# Patient Record
Sex: Female | Born: 1960 | Race: White | Hispanic: No | Marital: Married | Smoking: Never smoker
Health system: Southern US, Community
[De-identification: ages and names within clinical notes are randomized; demographics above are authoritative.]

## PROBLEM LIST (undated history)

## (undated) DIAGNOSIS — I82409 Acute embolism and thrombosis of unspecified deep veins of unspecified lower extremity: Secondary | ICD-10-CM

## (undated) DIAGNOSIS — J189 Pneumonia, unspecified organism: Secondary | ICD-10-CM

## (undated) DIAGNOSIS — D649 Anemia, unspecified: Secondary | ICD-10-CM

## (undated) DIAGNOSIS — Z800 Family history of malignant neoplasm of digestive organs: Secondary | ICD-10-CM

## (undated) DIAGNOSIS — I2699 Other pulmonary embolism without acute cor pulmonale: Secondary | ICD-10-CM

## (undated) DIAGNOSIS — Z87442 Personal history of urinary calculi: Secondary | ICD-10-CM

## (undated) DIAGNOSIS — Z5189 Encounter for other specified aftercare: Secondary | ICD-10-CM

## (undated) DIAGNOSIS — M1990 Unspecified osteoarthritis, unspecified site: Secondary | ICD-10-CM

## (undated) DIAGNOSIS — Z22322 Carrier or suspected carrier of Methicillin resistant Staphylococcus aureus: Secondary | ICD-10-CM

## (undated) DIAGNOSIS — Z8489 Family history of other specified conditions: Secondary | ICD-10-CM

## (undated) DIAGNOSIS — Z803 Family history of malignant neoplasm of breast: Secondary | ICD-10-CM

## (undated) DIAGNOSIS — Z8042 Family history of malignant neoplasm of prostate: Secondary | ICD-10-CM

## (undated) DIAGNOSIS — I639 Cerebral infarction, unspecified: Secondary | ICD-10-CM

## (undated) DIAGNOSIS — C541 Malignant neoplasm of endometrium: Secondary | ICD-10-CM

## (undated) DIAGNOSIS — E039 Hypothyroidism, unspecified: Secondary | ICD-10-CM

## (undated) DIAGNOSIS — L900 Lichen sclerosus et atrophicus: Secondary | ICD-10-CM

## (undated) DIAGNOSIS — T7840XA Allergy, unspecified, initial encounter: Secondary | ICD-10-CM

## (undated) DIAGNOSIS — R898 Other abnormal findings in specimens from other organs, systems and tissues: Secondary | ICD-10-CM

## (undated) HISTORY — DX: Family history of malignant neoplasm of breast: Z80.3

## (undated) HISTORY — DX: Malignant neoplasm of endometrium: C54.1

## (undated) HISTORY — DX: Anemia, unspecified: D64.9

## (undated) HISTORY — PX: ABDOMINAL HYSTERECTOMY: SHX81

## (undated) HISTORY — DX: Other abnormal findings in specimens from other organs, systems and tissues: R89.8

## (undated) HISTORY — DX: Encounter for other specified aftercare: Z51.89

## (undated) HISTORY — DX: Allergy, unspecified, initial encounter: T78.40XA

## (undated) HISTORY — DX: Family history of malignant neoplasm of digestive organs: Z80.0

## (undated) HISTORY — PX: MOUTH SURGERY: SHX715

## (undated) HISTORY — DX: Family history of malignant neoplasm of prostate: Z80.42

---

## 2006-08-27 ENCOUNTER — Other Ambulatory Visit: Admission: RE | Admit: 2006-08-27 | Discharge: 2006-08-27 | Payer: Self-pay | Admitting: Obstetrics and Gynecology

## 2007-09-03 ENCOUNTER — Other Ambulatory Visit: Admission: RE | Admit: 2007-09-03 | Discharge: 2007-09-03 | Payer: Self-pay | Admitting: Obstetrics and Gynecology

## 2008-09-03 ENCOUNTER — Other Ambulatory Visit: Admission: RE | Admit: 2008-09-03 | Discharge: 2008-09-03 | Payer: Self-pay | Admitting: Obstetrics and Gynecology

## 2009-01-25 ENCOUNTER — Ambulatory Visit: Payer: Self-pay | Admitting: Sports Medicine

## 2009-01-25 DIAGNOSIS — M25559 Pain in unspecified hip: Secondary | ICD-10-CM | POA: Insufficient documentation

## 2009-01-25 DIAGNOSIS — R29898 Other symptoms and signs involving the musculoskeletal system: Secondary | ICD-10-CM | POA: Insufficient documentation

## 2009-01-25 DIAGNOSIS — M4120 Other idiopathic scoliosis, site unspecified: Secondary | ICD-10-CM | POA: Insufficient documentation

## 2009-01-25 DIAGNOSIS — M25519 Pain in unspecified shoulder: Secondary | ICD-10-CM | POA: Insufficient documentation

## 2009-01-25 DIAGNOSIS — M2170 Unequal limb length (acquired), unspecified site: Secondary | ICD-10-CM | POA: Insufficient documentation

## 2009-02-22 ENCOUNTER — Ambulatory Visit: Payer: Self-pay | Admitting: Sports Medicine

## 2009-02-23 ENCOUNTER — Encounter: Payer: Self-pay | Admitting: Sports Medicine

## 2010-07-12 NOTE — Assessment & Plan Note (Signed)
Summary: fu hip/jw   Vital Signs:  Patient profile:   50 year old female BP sitting:   118 / 70  Vitals Entered By: Lillia Pauls CMA (February 22, 2009 9:45 AM)  History of Present Illness: 25F with L shoulder pain, R hip pain, bilateral knee discomfort, Lumbar back pain.  L shoulder:  rotator cuff syndrome, has been doing rotator cuff exercises but still with weak abduction.  Not c/o paresthesias today, did have computer moved up to relieve chronic neck flexion and keyboard placed lower and this significantly improved her symptoms.   No difficulty with overhead activities but mild pain persists.  no longer bothering her at night.  R hip:  known right leg length discrepancy, has been using right heel pad but only when exercising, states her symptoms are >50% resolved.  Needs another felt heel pad. She is a runner that still runs 3 mi every other day w/o changes in mileage.  Knees:  has crepitus behind patella, no pain.  present for years. No catching/locking.  No consistent "giving out" of the knees.  Better per pt.  LBP:  has known mild scoliosis, no radicular symptoms. Pain with biking that resolves with recumbent bike.  Review of Systems       See HPI  Physical Exam  General:  Well-developed,well-nourished,in no acute distress; alert,appropriate and cooperative throughout examination Msk:  Back Exam: Inspection: lumbar scoliosis noted with right sided prominence Motion: good ROM SLR seated: neg                         SLR lying:neg XSLR seated:  neg                      XSLR lying:neg Palpable tenderness:none FABER:non tender Sensory change:none Strength normal at hip and foot Gait normal  Knee: Normal to inspection with no erythema or effusion or obvious bony abnormalities. Palpation normal with no warmth or joint line tenderness or patellar tenderness or condyle tenderness. ROM normal in flexion and extension and lower leg rotation.  Ligaments with solid consistent endpoints including ACL, PCL, LCL, MCL. Negative Mcmurray's and provocative meniscal tests. Non painful patellar compression however this does increase crepitus. Patellar and quadriceps tendons unremarkable. Hamstring and quadriceps strength is normal.   Shoulder: Inspection reveals no abnormalities, atrophy or asymmetry. Palpation is normal with no tenderness over AC joint or bicipital groove. ROM is full in all planes. Rotator cuff strength weak to abduction on left No signs of impingement with negative Neer and Hawkin's tests, empty can. Speeds and Yergason's tests normal. No labral pathology noted with good stability. Normal scapular function observed. No painful arc and no drop arm sign. No apprehension sign Spurlings negative   Impression & Recommendations:  Problem # 1:  SHOULDER PAIN, LEFT (ICD-719.41) Assessment Improved improving rotator cuff syndrome, predominantly supraspinatus on exam today, will transition to rehab exercises with light weight for IR/ER/abd movements.  Continue to use monitor of computer higher up and keyboard lower to facilitate normal cervical lordosis and decrease activity with shoulders in flexion.  she has a number of MS issues and we discussed reassessing these in 3 months or so seh will work on rehab program and overall conditioning  Problem # 2:  UNEQUAL LEG LENGTH (ICD-736.81) Assessment: Improved Still using right heel pad, doing better, gave some additional pads for her to use if needed in every day walking shoes  Problem # 3:  JOINT CREPITUS, KNEE (  GMW-102.72) Assessment: Improved Likely in patellofemoral joint, recommended VMO strengthening exercises.  Problem # 4:  HIP PAIN, RIGHT (ICD-719.45) Improving with fixing leg length discrepancy.  Pt to continue her home exercises to strengthen abductors.  Problem # 5:  SCOLIOSIS (ICD-737.30)  With tension/strain in right paraspinal muscles.  Recommended stretching exercises for these muscles as well as pelvic tilts, right knee to left chest stretches.

## 2012-09-18 ENCOUNTER — Other Ambulatory Visit (HOSPITAL_COMMUNITY): Payer: Self-pay | Admitting: Chiropractic Medicine

## 2012-09-18 ENCOUNTER — Ambulatory Visit (HOSPITAL_COMMUNITY)
Admission: RE | Admit: 2012-09-18 | Discharge: 2012-09-18 | Disposition: A | Discharge status: Home / Self Care | Payer: 59 | Source: Ambulatory Visit | Attending: Chiropractic Medicine | Admitting: Chiropractic Medicine

## 2012-09-18 DIAGNOSIS — M545 Low back pain, unspecified: Secondary | ICD-10-CM

## 2012-09-18 DIAGNOSIS — R209 Unspecified disturbances of skin sensation: Secondary | ICD-10-CM | POA: Insufficient documentation

## 2012-09-18 DIAGNOSIS — R52 Pain, unspecified: Secondary | ICD-10-CM

## 2012-09-18 DIAGNOSIS — M47817 Spondylosis without myelopathy or radiculopathy, lumbosacral region: Secondary | ICD-10-CM | POA: Insufficient documentation

## 2012-09-18 DIAGNOSIS — M4040 Postural lordosis, site unspecified: Secondary | ICD-10-CM | POA: Insufficient documentation

## 2012-09-18 DIAGNOSIS — M25559 Pain in unspecified hip: Secondary | ICD-10-CM | POA: Insufficient documentation

## 2012-09-18 DIAGNOSIS — Q762 Congenital spondylolisthesis: Secondary | ICD-10-CM | POA: Insufficient documentation

## 2016-01-12 ENCOUNTER — Encounter (HOSPITAL_COMMUNITY): Payer: Self-pay | Admitting: Emergency Medicine

## 2016-01-12 ENCOUNTER — Emergency Department (HOSPITAL_COMMUNITY): Payer: 59

## 2016-01-12 ENCOUNTER — Observation Stay (HOSPITAL_COMMUNITY)
Admission: EM | Admit: 2016-01-12 | Discharge: 2016-01-14 | Disposition: A | Discharge status: Home / Self Care | Payer: 59 | Attending: Internal Medicine | Admitting: Internal Medicine

## 2016-01-12 ENCOUNTER — Encounter (HOSPITAL_COMMUNITY): Payer: Self-pay | Admitting: *Deleted

## 2016-01-12 ENCOUNTER — Ambulatory Visit (INDEPENDENT_AMBULATORY_CARE_PROVIDER_SITE_OTHER)
Admission: EM | Admit: 2016-01-12 | Discharge: 2016-01-12 | Disposition: A | Discharge status: Nursing Home (Includes ICF) | Payer: 59 | Source: Home / Self Care | Attending: Family Medicine | Admitting: Family Medicine

## 2016-01-12 DIAGNOSIS — R0602 Shortness of breath: Secondary | ICD-10-CM

## 2016-01-12 DIAGNOSIS — E063 Autoimmune thyroiditis: Secondary | ICD-10-CM | POA: Diagnosis not present

## 2016-01-12 DIAGNOSIS — R1011 Right upper quadrant pain: Secondary | ICD-10-CM

## 2016-01-12 DIAGNOSIS — I2699 Other pulmonary embolism without acute cor pulmonale: Secondary | ICD-10-CM | POA: Diagnosis present

## 2016-01-12 HISTORY — DX: Hypothyroidism, unspecified: E03.9

## 2016-01-12 HISTORY — DX: Other pulmonary embolism without acute cor pulmonale: I26.99

## 2016-01-12 LAB — CBC
HCT: 37.9 % (ref 36.0–46.0)
Hemoglobin: 12.4 g/dL (ref 12.0–15.0)
MCH: 29.3 pg (ref 26.0–34.0)
MCHC: 32.7 g/dL (ref 30.0–36.0)
MCV: 89.6 fL (ref 78.0–100.0)
Platelets: 219 10*3/uL (ref 150–400)
RBC: 4.23 MIL/uL (ref 3.87–5.11)
RDW: 12.7 % (ref 11.5–15.5)
WBC: 7.2 10*3/uL (ref 4.0–10.5)

## 2016-01-12 LAB — URINE MICROSCOPIC-ADD ON: RBC / HPF: NONE SEEN RBC/hpf (ref 0–5)

## 2016-01-12 LAB — COMPREHENSIVE METABOLIC PANEL
ALT: 19 U/L (ref 14–54)
AST: 21 U/L (ref 15–41)
Albumin: 4.2 g/dL (ref 3.5–5.0)
Alkaline Phosphatase: 69 U/L (ref 38–126)
Anion gap: 8 (ref 5–15)
BUN: 11 mg/dL (ref 6–20)
CO2: 26 mmol/L (ref 22–32)
Calcium: 9.2 mg/dL (ref 8.9–10.3)
Chloride: 105 mmol/L (ref 101–111)
Creatinine, Ser: 0.73 mg/dL (ref 0.44–1.00)
GFR calc Af Amer: >60 mL/min (ref >60)
GFR calc non Af Amer: >60 mL/min (ref >60)
Glucose, Bld: 93 mg/dL (ref 65–99)
Potassium: 3.5 mmol/L (ref 3.5–5.1)
Sodium: 139 mmol/L (ref 135–145)
Total Bilirubin: 0.5 mg/dL (ref 0.3–1.2)
Total Protein: 7.3 g/dL (ref 6.5–8.1)

## 2016-01-12 LAB — URINALYSIS, ROUTINE W REFLEX MICROSCOPIC
Bilirubin Urine: NEGATIVE
Glucose, UA: NEGATIVE mg/dL
Hgb urine dipstick: NEGATIVE
Ketones, ur: NEGATIVE mg/dL
Nitrite: NEGATIVE
Protein, ur: NEGATIVE mg/dL
Specific Gravity, Urine: 1.011 (ref 1.005–1.030)
pH: 6 (ref 5.0–8.0)

## 2016-01-12 LAB — LIPASE, BLOOD: Lipase: 28 U/L (ref 11–51)

## 2016-01-12 LAB — HCG, QUANTITATIVE, PREGNANCY: hCG, Beta Chain, Quant, S: 3 m[IU]/mL (ref <5)

## 2016-01-12 NOTE — ED Provider Notes (Signed)
[facility]    CSN: [id number] Arrival date & time: 01/12/16  1753  First Provider Contact:  First MD Initiated Contact with Patient 01/12/16 1917        History   Chief Complaint Chief Complaint  Patient presents with  . Abdominal Pain    HPI Karsha Scheidt is a 55 y.o. female.    Abdominal Pain  Pain location:  RUQ Pain quality: cramping   Pain radiates to:  R shoulder Pain severity:  Moderate Onset quality:  Sudden Duration:  18 hours Progression:  Waxing and waning Chronicity:  New Context: awakening from sleep   Context: not previous surgeries   Relieved by:  None tried Ineffective treatments:  None tried Associated symptoms: no chest pain, no constipation, no diarrhea, no dysuria, no fever, no nausea, no shortness of breath and no vomiting   Risk factors: has not had multiple surgeries     Past Medical History:  Diagnosis Date  . Hashimoto's disease     Patient Active Problem List   Diagnosis Date Noted  . SHOULDER PAIN, LEFT 01/25/2009  . HIP PAIN, RIGHT 01/25/2009  . JOINT CREPITUS, KNEE 01/25/2009  . UNEQUAL LEG LENGTH 01/25/2009  . SCOLIOSIS 01/25/2009    Past Surgical History:  Procedure Laterality Date  . MOUTH SURGERY      OB History    No data available       Home Medications    Prior to Admission medications   Medication Sig Start Date End Date Taking? Authorizing Provider  thyroid (ARMOUR) 90 MG tablet Take 90 mg by mouth daily.   Yes Historical Provider, MD    Family History History reviewed. No pertinent family history.  Social History Social History  Substance Use Topics  . Smoking status: Never Smoker  . Smokeless tobacco: Never Used  . Alcohol use Yes     Comment: occassional     Allergies   Review of patient's allergies indicates no known allergies.   Review of Systems Review of Systems  Constitutional: Negative.  Negative for fever.  Respiratory: Negative.  Negative for shortness of breath and  wheezing.   Cardiovascular: Negative.  Negative for chest pain.  Gastrointestinal: Positive for abdominal pain. Negative for constipation, diarrhea, nausea and vomiting.  Genitourinary: Negative.  Negative for dysuria and pelvic pain.  All other systems reviewed and are negative.    Physical Exam Triage Vital Signs ED Triage Vitals  Enc Vitals Group     BP 01/12/16 1857 119/98     Pulse Rate 01/12/16 1857 70     Resp 01/12/16 1857 18     Temp 01/12/16 1857 98.8 F (37.1 C)     Temp Source 01/12/16 1857 Oral     SpO2 01/12/16 1857 99 %     Weight 01/12/16 1857 142 lb (64.4 kg)     Height 01/12/16 1857 5\' 3"  (1.6 m)     Head Circumference --      Peak Flow --      Pain Score 01/12/16 1906 8     Pain Loc --      Pain Edu? --      Excl. in [place]? --    No data found.   Updated Vital Signs BP 119/98 (BP Location: Right Arm)   Pulse 70   Temp 98.8 F (37.1 C) (Oral)   Resp 18   Ht 5\' 3"  (1.6 m)   Wt 142 lb (64.4 kg)   LMP 09/04/2012   SpO2  99%   BMI 25.15 kg/m   Visual Acuity Right Eye Distance:   Left Eye Distance:   Bilateral Distance:    Right Eye Near:   Left Eye Near:    Bilateral Near:     Physical Exam  Constitutional: She appears well-developed and well-nourished. She appears distressed.  Abdominal: Soft. Normal appearance and bowel sounds are normal. She exhibits no mass. There is tenderness in the right upper quadrant. There is positive Murphy's sign. There is no rigidity, no rebound, no guarding and no CVA tenderness. No hernia.    Nursing note and vitals reviewed.    UC Treatments / Results  Labs (all labs ordered are listed, but only abnormal results are displayed) Labs Reviewed - No data to display  EKG  EKG Interpretation None       Radiology No results found.  Procedures Procedures (including critical care time)  Medications Ordered in UC Medications - No data to display   Initial Impression / Assessment and Plan / UC Course    I have reviewed the triage vital signs and the nursing notes.  Pertinent labs & imaging results that were available during my care of the patient were reviewed by me and considered in my medical decision making (see chart for details).  Clinical Course      Final Clinical Impressions(s) / UC Diagnoses   Final diagnoses:  Right upper quadrant pain    New Prescriptions New Prescriptions   No medications on file     Billy Fischer, MD 01/12/16 1934

## 2016-01-12 NOTE — ED Triage Notes (Signed)
The patient presented to the [facility] with a complaint of RUQ abdominal pain that started last night and radiates into her right shoulder and back area.

## 2016-01-12 NOTE — ED Provider Notes (Signed)
[facility] DEPT Provider Note   CSN: [id number] Arrival date & time: 01/12/16  1948  By signing my name below, I, Irene Pap, attest that this documentation has been prepared under the direction and in the presence of Veryl Speak, MD. Electronically Signed: Irene Pap, ED Scribe. 01/12/16. 11:28 PM.  First Provider Contact:  None   History   Chief Complaint Chief Complaint  Patient presents with  . Chest Pain   The history is provided by the patient. No language interpreter was used.  Abdominal Pain   This is a new problem. The current episode started yesterday. The problem occurs constantly. The problem has been gradually worsening. The pain is located in the RUQ and chest. The quality of the pain is sharp. The pain is moderate. Pertinent negatives include diarrhea, nausea and vomiting. The symptoms are aggravated by palpation and certain positions. Relieved by: abdominal breathing. Past workup comments: Urgent Care. Past medical history comments: Hashimoto's disease.   HPI Comments: Sherry Williams is a 55 y.o. female with a hx of Hashimoto's disease who presents to the Emergency Department complaining of gradually worsening, sharp RUQ abdominal pain and right lower rib pain onset one day ago. She reports associated SOB. She states that it feels like she "broke a rib bone." Pt reports worsening pain with palpation and certain positions. She reports relief with abdominal breathing. She states that she has been having shoulder pain for "a while" but it has worsened since the abdominal pain started. Pt was seen at Urgent Care and told to come to the ED for a gallbladder work up. Pt recently traveled to [place] and back; she traveled 19 hours by car and began to notice "Charlie Horse" pain to the posterior legs. Pt states that she is generally very healthy and does not consume heavy, greasy foods. She reports maternal hx of kidney stones and that her sister has a hx of blood clots. She  denies leg swelling, nausea, vomiting, or diarrhea.   Past Medical History:  Diagnosis Date  . Hashimoto's disease     Patient Active Problem List   Diagnosis Date Noted  . SHOULDER PAIN, LEFT 01/25/2009  . HIP PAIN, RIGHT 01/25/2009  . JOINT CREPITUS, KNEE 01/25/2009  . UNEQUAL LEG LENGTH 01/25/2009  . SCOLIOSIS 01/25/2009    Past Surgical History:  Procedure Laterality Date  . MOUTH SURGERY      OB History    No data available       Home Medications    Prior to Admission medications   Medication Sig Start Date End Date Taking? Authorizing Provider  thyroid (ARMOUR) 90 MG tablet Take 90 mg by mouth daily.    Historical Provider, MD    Family History History reviewed. No pertinent family history.  Social History Social History  Substance Use Topics  . Smoking status: Never Smoker  . Smokeless tobacco: Never Used  . Alcohol use Yes     Comment: occassional     Allergies   Review of patient's allergies indicates no known allergies.   Review of Systems Review of Systems  Respiratory: Positive for shortness of breath.   Cardiovascular: Negative for leg swelling.  Gastrointestinal: Positive for abdominal pain. Negative for diarrhea, nausea and vomiting.  All other systems reviewed and are negative.  Physical Exam Updated Vital Signs BP 120/76   Pulse 69   Temp 99 F (37.2 C) (Oral)   Resp 18   Ht 5' 5.75" (1.67 m)   Wt 143 lb 3 oz (  64.9 kg)   LMP 09/04/2012   SpO2 100%   BMI 23.29 kg/m   Physical Exam  Constitutional: She is oriented to person, place, and time. She appears well-developed and well-nourished. No distress.  HENT:  Head: Normocephalic and atraumatic.  Mouth/Throat: Oropharynx is clear and moist. No oropharyngeal exudate.  Eyes: Conjunctivae and EOM are normal. Pupils are equal, round, and reactive to light.  Neck: Normal range of motion. Neck supple.  No meningismus.  Cardiovascular: Normal rate, regular rhythm, normal heart  sounds and intact distal pulses.   No murmur heard. Pulmonary/Chest: Effort normal and breath sounds normal. No respiratory distress.  Abdominal: Soft. There is no tenderness. There is no rebound and no guarding.  Musculoskeletal: Normal range of motion. She exhibits no edema or tenderness.  No calf swelling or tenderness. Homan's sign is absent bilaterally  Neurological: She is alert and oriented to person, place, and time. No cranial nerve deficit. She exhibits normal muscle tone. Coordination normal.  Skin: Skin is warm.  Psychiatric: She has a normal mood and affect. Her behavior is normal.  Nursing note and vitals reviewed.    ED Treatments / Results  DIAGNOSTIC STUDIES: Oxygen Saturation is 100% on RA, normal by my interpretation.    COORDINATION OF CARE: 11:25 PM-Discussed treatment plan which includes labs, [place], and EKG with pt at bedside and pt agreed to plan.    Labs (all labs ordered are listed, but only abnormal results are displayed) Labs Reviewed  URINALYSIS, ROUTINE W REFLEX MICROSCOPIC (NOT AT [facility]) - Abnormal; Notable for the following:       Result Value   Leukocytes, UA TRACE (*)    All other components within normal limits  URINE MICROSCOPIC-ADD ON - Abnormal; Notable for the following:    Squamous Epithelial / LPF 0-5 (*)    Bacteria, UA RARE (*)    All other components within normal limits  D-DIMER, QUANTITATIVE (NOT AT [facility]) - Abnormal; Notable for the following:    D-Dimer, Quant >20.00 (*)    All other components within normal limits  LIPASE, BLOOD  COMPREHENSIVE METABOLIC PANEL  CBC  HCG, QUANTITATIVE, PREGNANCY    EKG  EKG Interpretation  Date/Time:  Thursday January 13 2016 00:18:19 EDT Ventricular Rate:  64 PR Interval:    QRS Duration: 97 QT Interval:  416 QTC Calculation: 430 R Axis:   85 Text Interpretation:  Sinus rhythm Short PR interval RSR' in V1 or V2, probably normal variant Confirmed by Asya Derryberry  MD, Loveda Colaizzi ([id number]) on 01/13/2016  12:22:47 AM       Radiology Dg Chest 2 View  Result Date: 01/12/2016 CLINICAL DATA:  Right-sided chest pain, shortness of breath. EXAM: CHEST  2 VIEW COMPARISON:  None. FINDINGS: The heart size and mediastinal contours are within normal limits. Both lungs are clear. No pneumothorax or pleural effusion is noted. The visualized skeletal structures are unremarkable. IMPRESSION: No active cardiopulmonary disease. Electronically Signed   By: Marijo Conception, M.D.   On: 01/12/2016 21:42   Ct Angio Chest Pe W Or Wo Contrast  Result Date: 01/13/2016 CLINICAL DATA:  Chest pain. Right upper quadrant abdominal pain radiating to the right shoulder. EXAM: CT ANGIOGRAPHY CHEST WITH CONTRAST TECHNIQUE: Multidetector CT imaging of the chest was performed using the standard protocol during bolus administration of intravenous contrast. Multiplanar CT image reconstructions and MIPs were obtained to evaluate the vascular anatomy. CONTRAST:  100 cc Isovue 370 IV COMPARISON:  Chest radiographs yesterday at 2140 hour FINDINGS: Cardiovascular:  Filling defects in the segmental anterior right upper lobe, medial and lateral right middle lobe pulmonary arteries consistent with pulmonary embolus. Some of the clot appears E centric for example in the lateral right middle lobe, and may be chronic, however the additional clot may be acute. There is no right heart strain, RV to LV ratio of less than 1. Minimal contrast does reflux into the hepatic veins and IVC. 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 No evidence of dissection. Mediastinum/Nodes: No adenopathy or mass. Lungs/Pleura: Peripheral patchy ground glass airspace opacity in the right middle lobe may be pulmonary infarct. Dependent atelectasis in both lower lobes, right greater than left. There is a small right pleural effusion. Upper Abdomen: No acute abnormality. Musculoskeletal: There are no acute or suspicious osseous abnormalities.  Bone islands within T9. Review of the MIP images confirms the above findings. IMPRESSION: Examination is positive for segmental pulmonary emboli in the right upper and middle lobes. No right heart strain. Minimal pulmonary infarct in the right middle lobe. Small right pleural effusion. Critical Value/emergent results were called by telephone at the time of interpretation on 01/13/2016 at 3:34 am to [place], who verbally acknowledged these results. Electronically Signed   By: Jeb Levering M.D.   On: 01/13/2016 03:36   US Abdomen Limited Ruq  Result Date: 01/13/2016 CLINICAL DATA:  Right upper quadrant pain radiating to the right shoulder for 1 day. EXAM: US ABDOMEN LIMITED - RIGHT UPPER QUADRANT COMPARISON:  None. FINDINGS: Gallbladder: Physiologically distended. No gallstones or wall thickening visualized. No sonographic Murphy sign noted by sonographer. Common bile duct: Diameter: 2.7 mm. Liver: No focal lesion identified. Within normal limits in parenchymal echogenicity. Normal directional flow in the main portal vein. IMPRESSION: Normal right upper quadrant ultrasound. Electronically Signed   By: Jeb Levering M.D.   On: 01/13/2016 00:11    Procedures Procedures (including critical care time)  Medications Ordered in ED Medications - No data to display   Initial Impression / Assessment and Plan / ED Course  I have reviewed the triage vital signs and the nursing notes.  Pertinent labs & imaging results that were available during my care of the patient were reviewed by me and considered in my medical decision making (see chart for details).  Clinical Course      Final Clinical Impressions(s) / ED Diagnoses   Final diagnoses:  None   Patient presents here with complaints of right-sided chest and upper abdomen discomfort and shortness of breath that is worsened over the past 2 days. She recently went on a car trip to and from [place].  Her workup reveals no evidence for  gallbladder pathology, however does reveal an elevated d-dimer and CT scan confirmed pulmonary embolism.  I've discussed these findings with Dr. Loleta Books from the hospitalist service who will admit the patient overnight for anticoagulation and observation.   I personally performed the services described in this documentation, which was scribed in my presence. The recorded information has been reviewed and is accurate.     New Prescriptions New Prescriptions   No medications on file     Veryl Speak, MD 01/13/16 [phone]

## 2016-01-12 NOTE — ED Notes (Signed)
Patient report called to First Rn at [facility] ED.

## 2016-01-12 NOTE — ED Triage Notes (Signed)
Pt c/o RUQ, right lower rib pain. Pt was seen at [facility] for gallbladder workup. Pt states last night she could hardly breath lying down while in pain. Pt states abdominal breathing helps with the pain. Pt has not taken any medications for the pain. Pt denies n/v.

## 2016-01-13 ENCOUNTER — Emergency Department (HOSPITAL_COMMUNITY): Payer: 59

## 2016-01-13 ENCOUNTER — Encounter (HOSPITAL_COMMUNITY): Payer: Self-pay | Admitting: Radiology

## 2016-01-13 ENCOUNTER — Telehealth: Payer: Self-pay | Admitting: General Practice

## 2016-01-13 DIAGNOSIS — I2609 Other pulmonary embolism with acute cor pulmonale: Secondary | ICD-10-CM

## 2016-01-13 DIAGNOSIS — I2699 Other pulmonary embolism without acute cor pulmonale: Secondary | ICD-10-CM | POA: Diagnosis not present

## 2016-01-13 DIAGNOSIS — E038 Other specified hypothyroidism: Secondary | ICD-10-CM

## 2016-01-13 DIAGNOSIS — R1011 Right upper quadrant pain: Secondary | ICD-10-CM

## 2016-01-13 DIAGNOSIS — E063 Autoimmune thyroiditis: Secondary | ICD-10-CM | POA: Diagnosis present

## 2016-01-13 HISTORY — DX: Other pulmonary embolism without acute cor pulmonale: I26.99

## 2016-01-13 LAB — D-DIMER, QUANTITATIVE (NOT AT ARMC): D-Dimer, Quant: >20 ug/mL-FEU — ABNORMAL HIGH (ref 0.00–0.50)

## 2016-01-13 MED ORDER — ACETAMINOPHEN 650 MG RE SUPP: 650.0000 mg | Freq: Four times a day (QID) | RECTAL | Status: DC | PRN

## 2016-01-13 MED ORDER — IBUPROFEN 400 MG PO TABS
400.0000 mg | ORAL_TABLET | Freq: Three times a day (TID) | ORAL | Status: DC | PRN
  Administered 2016-01-13: 400 mg via ORAL
  Administered 2016-01-13: 200 mg via ORAL
  Administered 2016-01-13: 400 mg via ORAL
  Filled 2016-01-13: qty 1
  Filled 2016-01-13 (×2): qty 2

## 2016-01-13 MED ORDER — RIVAROXABAN 15 MG PO TABS: 15.0000 mg | ORAL_TABLET | Freq: Two times a day (BID) | ORAL | Status: DC

## 2016-01-13 MED ORDER — APIXABAN 5 MG PO TABS: 10.0000 mg | ORAL_TABLET | Freq: Two times a day (BID) | ORAL | Status: DC

## 2016-01-13 MED ORDER — IOPAMIDOL (ISOVUE-370) INJECTION 76%
INTRAVENOUS | Status: AC
  Administered 2016-01-13: 100 mL
  Filled 2016-01-13: qty 100

## 2016-01-13 MED ORDER — PANTOPRAZOLE SODIUM 20 MG PO TBEC
20.0000 mg | DELAYED_RELEASE_TABLET | Freq: Every day | ORAL | Status: DC
  Filled 2016-01-13: qty 1

## 2016-01-13 MED ORDER — ACETAMINOPHEN 325 MG PO TABS
650.0000 mg | ORAL_TABLET | Freq: Four times a day (QID) | ORAL | Status: DC | PRN
  Administered 2016-01-13: 325 mg via ORAL
  Administered 2016-01-14: 650 mg via ORAL
  Filled 2016-01-13 (×2): qty 2

## 2016-01-13 MED ORDER — RIVAROXABAN 15 MG PO TABS
15.0000 mg | ORAL_TABLET | Freq: Two times a day (BID) | ORAL | Status: DC
  Administered 2016-01-13 – 2016-01-14 (×2): 15 mg via ORAL
  Filled 2016-01-13 (×2): qty 1

## 2016-01-13 MED ORDER — ENOXAPARIN SODIUM 80 MG/0.8ML ~~LOC~~ SOLN: 1.0000 mg/kg | Freq: Two times a day (BID) | SUBCUTANEOUS | Status: DC

## 2016-01-13 MED ORDER — ENOXAPARIN SODIUM 80 MG/0.8ML ~~LOC~~ SOLN
1.0000 mg/kg | SUBCUTANEOUS | Status: AC
  Administered 2016-01-13: 65 mg via SUBCUTANEOUS
  Filled 2016-01-13: qty 0.8

## 2016-01-13 MED ORDER — THYROID 30 MG PO TABS
90.0000 mg | ORAL_TABLET | Freq: Every day | ORAL | Status: DC
  Administered 2016-01-13 – 2016-01-14 (×2): 90 mg via ORAL
  Filled 2016-01-13 (×2): qty 1

## 2016-01-13 NOTE — Progress Notes (Signed)
Late entry for 01/13/2016 @ 0610 am. Patient arrived to floor via stretcher, alert and oriented X 4. Vital signs taken, Information book, and explained some safety issues.  Her skin is intact, and has Hashimotos Disease. She presented with RUQ pain, radiating to her shoulder and back. D dimer was greater than 20 and CT was positive for PE.  Has steady gait, is low fall risk. Myself and Nurse tech verified her name band. She was also placed on tele and it was verified by myself and a nurse tech. She is resting comfortably at this time. Patient was also notified re: hospital not being reponsible to valuables and she understood this. I explained Plan of care to patient. I gave report to oncoming nurse.

## 2016-01-13 NOTE — Telephone Encounter (Signed)
error 

## 2016-01-13 NOTE — H&P (Signed)
History and Physical  Patient Name: Sherry Williams     [id number]    DOB: [birth year]    DOA: 01/12/2016 PCP: No PCP Per Patient, would like to see husband's PCP Burchette  Patient coming from: Home  Chief Complaint: Chest pain  HPI: Sherry Williams is a 55 y.o. female with a past medical history significant for hypothyroidism who presents with chest pain.  The patient was in her usual state of health until 2 days ago when she was trying to sleep and couldn't because of a severe right "rib pain" radiating to the shoulder. This was sharp in character, worse with position and certain types of breathing, and only improved with sleeping sitting up. Since then, the pain persisted, moderate to severe in intensity, and so she came to the ER tonight to be checked out.  ED course: -Low-grade temperature, heart rate and respirations normal, oxygen saturation normal on room air -Na 139, K 3.5, Cr 0.73 (baseline unknown), WBC 7.2 K, Hgb 12 -Chest x-ray clear -Transaminases and lipase normal, and right upper quadrant ultrasound negative -D-dimer markedly elevated, CT angiogram showed segmental PE and multiple lobes without evidence of right heart strain -She was given enoxaparin, therapeutic dose, and TRH were asked to evaluate for admission  She has had no recent surgery.  She is up to date on mammograms (yearly, last one last fall), and reports no recent weight loss.  She has a sister who had a DVT, no other family history of VTE.  She and her family recently drove to [place] 19 hours in the car.  While there, she had some calf pain that persisted the whole week and finally resolved by itself, but never with leg swelling, redness or leg congestion (just an aching, mid-posterior calf pain bilaterally).  Her only medicine is Armour thyroid, some OTC supplements.    Review of Systems:  Pt complains of chest and RUQ pain, pleuritic pain, dyspnea, low grade fever. All other systems negative except as  just noted or noted in the history of present illness.    Past Medical History:  Diagnosis Date  . Hashimoto's disease     Past Surgical History:  Procedure Laterality Date  . MOUTH SURGERY      Social History: Patient lives with her husband.  Patient walks unassisted.  She works in [employer].  Three grown children.  Does not smoke.  Marland Kitchen    No Known Allergies  Family history: family history includes Alzheimer's disease in her father; Breast cancer in her paternal grandmother; Deep vein thrombosis in her sister; Stroke in her father.  Prior to Admission medications   Medication Sig Start Date End Date Taking? Authorizing Provider  thyroid (ARMOUR) 90 MG tablet Take 90 mg by mouth every evening.    Yes Historical Provider, MD       Physical Exam: BP 102/64   Pulse 71   Temp 99 F (37.2 C) (Oral)   Resp 15   Ht 5' 5.75" (1.67 m)   Wt 64.9 kg (143 lb 3 oz)   LMP 09/04/2012   SpO2 100%   BMI 23.29 kg/m  General appearance: Well-developed, adult female, alert and in no acute distress.   Eyes: Anicteric, conjunctiva pink, lids and lashes normal.     ENT: No nasal deformity, discharge, or epistaxis.  OP moist without lesions.   Skin: Warm and dry.  No jaundice.  No suspicious rashes or lesions. Cardiac: RRR, nl S1-S2, no murmurs appreciated.  Capillary refill is brisk.  JVP normal.  No LE edema.  Radial and DP pulses 2+ and symmetric.  Respiratory: Normal respiratory rate and rhythm.  Wheeze isolated on LEFT.   Breath sounds diminished on RIGHT, without crackles. Abdomen: Abdomen soft without rigidity.  No TTP. No ascites, distension.   MSK: No deformities or effusions.  No leg swelling.  Negative Homans and no calf pain. Neuro: Sensorium intact and responding to questions, attention normal.  Speech is fluent.  Moves all extremities equally and with normal coordination.    Psych: Behavior appropriate.  Affect normal.  No evidence of aural or visual hallucinations or delusions.        Labs on Admission:  I have personally reviewed the following studies: The metabolic panel shows normal electro lites and renal function. The complete blood count shows no anemia, leukocytosis, thrombocytopenia. Transaminases and bilirubin and lipase normal   Radiological Exams on Admission: Personally reviewed: Dg Chest 2 View  Result Date: 01/12/2016 CLINICAL DATA:  Right-sided chest pain, shortness of breath. EXAM: CHEST  2 VIEW COMPARISON:  None. FINDINGS: The heart size and mediastinal contours are within normal limits. Both lungs are clear. No pneumothorax or pleural effusion is noted. The visualized skeletal structures are unremarkable. IMPRESSION: No active cardiopulmonary disease. Electronically Signed   By: Marijo Conception, M.D.   On: 01/12/2016 21:42   Ct Angio Chest Pe W Or Wo Contrast  Result Date: 01/13/2016 CLINICAL DATA:  Chest pain. Right upper quadrant abdominal pain radiating to the right shoulder. EXAM: CT ANGIOGRAPHY CHEST WITH CONTRAST TECHNIQUE: Multidetector CT imaging of the chest was performed using the standard protocol during bolus administration of intravenous contrast. Multiplanar CT image reconstructions and MIPs were obtained to evaluate the vascular anatomy. CONTRAST:  100 cc Isovue 370 IV COMPARISON:  Chest radiographs yesterday at 2140 hour FINDINGS: Cardiovascular: Filling defects in the segmental anterior right upper lobe, medial and lateral right middle lobe pulmonary arteries consistent with pulmonary embolus. Some of the clot appears E centric for example in the lateral right middle lobe, and may be chronic, however the additional clot may be acute. There is no right heart strain, RV to LV ratio of less than 1. Minimal contrast does reflux into the hepatic veins and IVC. 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 No evidence of dissection. Mediastinum/Nodes: No adenopathy or mass. Lungs/Pleura: Peripheral patchy  ground glass airspace opacity in the right middle lobe may be pulmonary infarct. Dependent atelectasis in both lower lobes, right greater than left. There is a small right pleural effusion. Upper Abdomen: No acute abnormality. Musculoskeletal: There are no acute or suspicious osseous abnormalities. Bone islands within T9. Review of the MIP images confirms the above findings. IMPRESSION: Examination is positive for segmental pulmonary emboli in the right upper and middle lobes. No right heart strain. Minimal pulmonary infarct in the right middle lobe. Small right pleural effusion. Critical Value/emergent results were called by telephone at the time of interpretation on 01/13/2016 at 3:34 am to [place], who verbally acknowledged these results. Electronically Signed   By: Jeb Levering M.D.   On: 01/13/2016 03:36   US Abdomen Limited Ruq  Result Date: 01/13/2016 CLINICAL DATA:  Right upper quadrant pain radiating to the right shoulder for 1 day. EXAM: US ABDOMEN LIMITED - RIGHT UPPER QUADRANT COMPARISON:  None. FINDINGS: Gallbladder: Physiologically distended. No gallstones or wall thickening visualized. No sonographic Murphy sign noted by sonographer. Common bile duct: Diameter: 2.7 mm. Liver: No focal  lesion identified. Within normal limits in parenchymal echogenicity. Normal directional flow in the main portal vein. IMPRESSION: Normal right upper quadrant ultrasound. Electronically Signed   By: Jeb Levering M.D.   On: 01/13/2016 00:11    EKG: Independently reviewed. Rate 64, QTC 4:30, normal sinus rhythm.    Assessment/Plan 1. PE:  Appears to be first unprovoked PE.  Hemodynamically stable.  There is some degree of pleuritis/infarction apparent on CT, and patient does have some pain. She lost a child to overdose, and declines opioids. -Lovenox now -Care Mgmt consult to determine which NOAC is covered by her insurance -Check doppler US of legs -Plan to start oral anticoagulant at  [place] (when next dose of Lovenox is due) then likely discharge if stable -Ibuprofen 400-600 mg TID for pain -Acetaminophen PRN -Pantoprazole is ordered for GI protection while taking ibuprofen    2. Hypothyroidism:  -Continue home thyroid supplement       Code Status: FULL  Family Communication: None present  Disposition Plan: Anticipate initiation of anticoagulation.  Check doppler LE [place] of both legs.  Discuss with Care Management.  Discharge either tonight or tomorrow. Consults called: None Admission status: OBS, med surg   Medical decision making: Patient seen at 5:00 AM on 01/13/2016.  The patient was discussed with Dr. Stark Jock.  What exists of the patient's chart was reviewed in depth.  Clinical condition: stable.          Edwin Dada [facility] Pager [phone]

## 2016-01-13 NOTE — Discharge Instructions (Addendum)
Information on my medicine - XARELTO (rivaroxaban)  This medication education was reviewed with me or my healthcare representative as part of my discharge preparation.  The pharmacist that spoke with me during my hospital stay was:  Arty Baumgartner, [place]? Xarelto was prescribed to treat blood clots that may have been found in the veins of your legs (deep vein thrombosis) or in your lungs (pulmonary embolism) and to reduce the risk of them occurring again.  What do you need to know about Xarelto? The starting dose is one 15 mg tablet taken TWICE daily with food for the FIRST 21 DAYS then on (enter date)  02/04/16  the dose is changed to one 20 mg tablet taken ONCE A DAY with your evening meal.  DO NOT stop taking Xarelto without talking to the health care provider who prescribed the medication.  Refill your prescription for 20 mg tablets before you run out.  After discharge, you should have regular check-up appointments with your healthcare provider that is prescribing your Xarelto.  In the future your dose may need to be changed if your kidney function changes by a significant amount.  What do you do if you miss a dose? If you are taking Xarelto TWICE DAILY and you miss a dose, take it as soon as you remember. You may take two 15 mg tablets (total 30 mg) at the same time then resume your regularly scheduled 15 mg twice daily the next day.  If you are taking Xarelto ONCE DAILY and you miss a dose, take it as soon as you remember on the same day then continue your regularly scheduled once daily regimen the next day. Do not take two doses of Xarelto at the same time.   Important Safety Information Xarelto is a blood thinner medicine that can cause bleeding. You should call your healthcare provider right away if you experience any of the following: ? Bleeding from an injury or your nose that does not stop. ? Unusual colored urine (red or dark brown)  or unusual colored stools (red or black). ? Unusual bruising for unknown reasons. ? A serious fall or if you hit your head (even if there is no bleeding).  Some medicines may interact with Xarelto and might increase your risk of bleeding while on Xarelto. To help avoid this, consult your healthcare provider or pharmacist prior to using any new prescription or non-prescription medications, including herbals, vitamins, non-steroidal anti-inflammatory drugs (NSAIDs) and supplements.  This website has more information on Xarelto: [email or web address]

## 2016-01-13 NOTE — Progress Notes (Signed)
S/W EDDIE @ [facility] # [phone]   XARELTO 20 MG DAILY ( 30 )  COVER- YES  CO-PAY- $ 35.00   30 TAB  TIER- 2 DRUG  PRIOR APPROVAL - NO  MAIL-ORDER FOR 90 DAY SUPPLY $ 87.50   ELIQUIS 5 MG BID (30 )  COVER- YES  CO-PAY-$ 60.00 60 TAB  TIER- 3 DRUG  PRIOR APPROVAL - NO  MAIL ORDER FOR 90 DAY SUPPLY $150.00   PHARMACY : [facility]

## 2016-01-13 NOTE — Progress Notes (Addendum)
ANTICOAGULATION CONSULT NOTE - Initial Consult  Pharmacy Consult for Eliquis -> not given, changing to Xarelto d/t insurance coverage Indication: pulmonary embolus  No Known Allergies  Patient Measurements: Height: '5\' 5"'  (165.1 cm) Weight: 141 lb 6.4 oz (64.1 kg) IBW/kg (Calculated) : 57  Vital Signs: Temp: 98.6 F (37 C) (08/03 0607) Temp Source: Oral (08/03 0607) BP: 109/56 (08/03 0607) Pulse Rate: 130 (08/03 0607)  Labs:  Recent Labs  01/12/16 2031  HGB 12.4  HCT 37.9  PLT 219  CREATININE 0.73    Estimated Creatinine Clearance: 70.7 mL/min (by C-G formula based on SCr of 0.8 mg/dL).   Medical History: Past Medical History:  Diagnosis Date  . Hypothyroidism    Hashimoto  . Pulmonary embolism ([place]) 01/13/2016   After car trip   Assessment:   55 yr old female admitted overnight with new PE.   Lovenox 65 mg (1 mg/kg) sq given ~5am.   To transition to Eliquis today.  Goal of Therapy:  therapeutic anticoagulation Monitor platelets by anticoagulation protocol: Yes   Plan:   Begin Eliquis 10 mg BID x 1 week at 5pm today, 12 hrs after Lovenox injection.  After 1 week, decrease Eliquis to 5 mg BID.  Patient is currently sleeping and would like to rest. Spoke with daughter in the hallway.  Will return later today for Eliquis education.  Arty Baumgartner, [place] Pager: [phone] 01/13/2016,12:52 PM   Addendum:   Alveda Reasons is less expensive per her insurance.   No Eliquis given.   Changing to Xarelto 15 mg BID x 21 days to begin at 5pm, then 20 mg daily with supper.   Please prescribe Xarelto starter kit at discharge.   Transition to 20 mg daily on 02/04/16.   Anticoagulation education completed.  Kelvin Cellar, RPh  3:29 PM

## 2016-01-13 NOTE — Progress Notes (Signed)
Patient seen and examined  55 y.o. female with a past medical history significant for hypothyroidism who presents with chest pain.D-dimer markedly elevated, CT angiogram showed segmental PE and multiple lobes without evidence of right heart strain. Patient not particularly symptomatic, 95% on room air, hemodynamically stable, currently on Lovenox plan is to switch to xarelto  and discharge home tomorrow She just returned from a 19 hr road trip to [place], this is most likely cause of her DVT

## 2016-01-14 ENCOUNTER — Observation Stay (HOSPITAL_BASED_OUTPATIENT_CLINIC_OR_DEPARTMENT_OTHER): Payer: 59

## 2016-01-14 DIAGNOSIS — R0602 Shortness of breath: Secondary | ICD-10-CM | POA: Diagnosis not present

## 2016-01-14 DIAGNOSIS — E038 Other specified hypothyroidism: Secondary | ICD-10-CM | POA: Diagnosis not present

## 2016-01-14 DIAGNOSIS — I2609 Other pulmonary embolism with acute cor pulmonale: Secondary | ICD-10-CM | POA: Diagnosis not present

## 2016-01-14 DIAGNOSIS — R1011 Right upper quadrant pain: Secondary | ICD-10-CM | POA: Diagnosis not present

## 2016-01-14 LAB — COMPREHENSIVE METABOLIC PANEL
ALT: 16 U/L (ref 14–54)
AST: 17 U/L (ref 15–41)
Albumin: 3.6 g/dL (ref 3.5–5.0)
Alkaline Phosphatase: 62 U/L (ref 38–126)
Anion gap: 9 (ref 5–15)
BUN: 13 mg/dL (ref 6–20)
CO2: 27 mmol/L (ref 22–32)
Calcium: 9.1 mg/dL (ref 8.9–10.3)
Chloride: 103 mmol/L (ref 101–111)
Creatinine, Ser: 0.81 mg/dL (ref 0.44–1.00)
GFR calc Af Amer: >60 mL/min (ref >60)
GFR calc non Af Amer: >60 mL/min (ref >60)
Glucose, Bld: 141 mg/dL — ABNORMAL HIGH (ref 65–99)
Potassium: 4 mmol/L (ref 3.5–5.1)
Sodium: 139 mmol/L (ref 135–145)
Total Bilirubin: 0.7 mg/dL (ref 0.3–1.2)
Total Protein: 6.4 g/dL — ABNORMAL LOW (ref 6.5–8.1)

## 2016-01-14 LAB — CBC
HCT: 35.8 % — ABNORMAL LOW (ref 36.0–46.0)
Hemoglobin: 11.7 g/dL — ABNORMAL LOW (ref 12.0–15.0)
MCH: 29.2 pg (ref 26.0–34.0)
MCHC: 32.7 g/dL (ref 30.0–36.0)
MCV: 89.3 fL (ref 78.0–100.0)
Platelets: 225 10*3/uL (ref 150–400)
RBC: 4.01 MIL/uL (ref 3.87–5.11)
RDW: 12.7 % (ref 11.5–15.5)
WBC: 5.6 10*3/uL (ref 4.0–10.5)

## 2016-01-14 MED ORDER — RIVAROXABAN 15 MG PO TABS
ORAL_TABLET | ORAL | 0 refills | Status: DC
Start: 2016-01-14 — End: 2016-04-13

## 2016-01-14 MED ORDER — RIVAROXABAN 20 MG PO TABS: 20.0000 mg | ORAL_TABLET | Freq: Every day | ORAL | 5 refills | Status: DC

## 2016-01-14 MED ORDER — PANTOPRAZOLE SODIUM 20 MG PO TBEC: 20.0000 mg | DELAYED_RELEASE_TABLET | Freq: Every day | ORAL | 0 refills | Status: DC

## 2016-01-14 NOTE — Progress Notes (Signed)
SATURATION QUALIFICATIONS: (This note is used to comply with regulatory documentation for home oxygen)  Patient Saturations on Room Air at Rest = 100%  Patient Saturations on Room Air while Ambulating = 94%  Please briefly explain why patient needs home oxygen: N/A

## 2016-01-14 NOTE — Discharge Summary (Signed)
Physician Discharge Summary  Sherry Williams MRN: [id number] DOB/AGE: Jul 11, 1960 55 y.o.  PCP: No PCP Per Patient   Admit date: 01/12/2016 Discharge date: 01/14/2016  Discharge Diagnoses:    Principal Problem:   Pulmonary embolism ([place]) Active Problems:   Hypothyroidism, acquired, autoimmune   RUQ pain    Follow-up recommendations Follow-up with PCP in 3-5 days , including all  additional recommended appointments as below Follow-up CBC, CMP in 3-5 days       Current Discharge Medication List    START taking these medications   Details  pantoprazole (PROTONIX) 20 MG tablet Take 1 tablet (20 mg total) by mouth daily. Qty: 30 tablet, Refills: 0    !! Rivaroxaban (XARELTO) 15 MG TABS tablet Xarelto 15 mg twice a day until 8/24 Qty: 42 tablet, Refills: 0    !! rivaroxaban (XARELTO) 20 MG TABS tablet Take 1 tablet (20 mg total) by mouth daily with supper. Qty: 30 tablet, Refills: 5     !! - Potential duplicate medications found. Please discuss with provider.    CONTINUE these medications which have NOT CHANGED   Details  thyroid (ARMOUR) 90 MG tablet Take 90 mg by mouth every evening.          Discharge Condition:  Stable   Discharge Instructions Get Medicines reviewed and adjusted: Please take all your medications with you for your next visit with your Primary MD  Please request your Primary MD to go over all hospital tests and procedure/radiological results at the follow up, please ask your Primary MD to get all Hospital records sent to his/her office.  If you experience worsening of your admission symptoms, develop shortness of breath, life threatening emergency, suicidal or homicidal thoughts you must seek medical attention immediately by calling 911 or calling your MD immediately if symptoms less severe.  You must read complete instructions/literature along with all the possible adverse reactions/side effects for all the Medicines you take and that have been  prescribed to you. Take any new Medicines after you have completely understood and accpet all the possible adverse reactions/side effects.   Do not drive when taking Pain medications.   Do not take more than prescribed Pain, Sleep and Anxiety Medications  Special Instructions: If you have smoked or chewed Tobacco in the last 2 yrs please stop smoking, stop any regular Alcohol and or any Recreational drug use.  Wear Seat belts while driving.  Please note  You were cared for by a hospitalist during your hospital stay. Once you are discharged, your primary care physician will handle any further medical issues. Please note that NO REFILLS for any discharge medications will be authorized once you are discharged, as it is imperative that you return to your primary care physician (or establish a relationship with a primary care physician if you do not have one) for your aftercare needs so that they can reassess your need for medications and monitor your lab values.  Discharge Instructions    Diet - low sodium heart healthy    Complete by:  As directed   Increase activity slowly    Complete by:  As directed       No Known Allergies    Disposition: home    Consults:  None     Significant Diagnostic Studies:  Dg Chest 2 View  Result Date: 01/12/2016 CLINICAL DATA:  Right-sided chest pain, shortness of breath. EXAM: CHEST  2 VIEW COMPARISON:  None. FINDINGS: The heart size and mediastinal contours are within normal  limits. Both lungs are clear. No pneumothorax or pleural effusion is noted. The visualized skeletal structures are unremarkable. IMPRESSION: No active cardiopulmonary disease. Electronically Signed   By: Marijo Conception, M.D.   On: 01/12/2016 21:42   Ct Angio Chest Pe W Or Wo Contrast  Result Date: 01/13/2016 CLINICAL DATA:  Chest pain. Right upper quadrant abdominal pain radiating to the right shoulder. EXAM: CT ANGIOGRAPHY CHEST WITH CONTRAST TECHNIQUE: Multidetector CT  imaging of the chest was performed using the standard protocol during bolus administration of intravenous contrast. Multiplanar CT image reconstructions and MIPs were obtained to evaluate the vascular anatomy. CONTRAST:  100 cc Isovue 370 IV COMPARISON:  Chest radiographs yesterday at 2140 hour FINDINGS: Cardiovascular: Filling defects in the segmental anterior right upper lobe, medial and lateral right middle lobe pulmonary arteries consistent with pulmonary embolus. Some of the clot appears E centric for example in the lateral right middle lobe, and may be chronic, however the additional clot may be acute. There is no right heart strain, RV to LV ratio of less than 1. Minimal contrast does reflux into the hepatic veins and IVC. 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 No evidence of dissection. Mediastinum/Nodes: No adenopathy or mass. Lungs/Pleura: Peripheral patchy ground glass airspace opacity in the right middle lobe may be pulmonary infarct. Dependent atelectasis in both lower lobes, right greater than left. There is a small right pleural effusion. Upper Abdomen: No acute abnormality. Musculoskeletal: There are no acute or suspicious osseous abnormalities. Bone islands within T9. Review of the MIP images confirms the above findings. IMPRESSION: Examination is positive for segmental pulmonary emboli in the right upper and middle lobes. No right heart strain. Minimal pulmonary infarct in the right middle lobe. Small right pleural effusion. Critical Value/emergent results were called by telephone at the time of interpretation on 01/13/2016 at 3:34 am to [place], who verbally acknowledged these results. Electronically Signed   By: Jeb Levering M.D.   On: 01/13/2016 03:36   US Abdomen Limited Ruq  Result Date: 01/13/2016 CLINICAL DATA:  Right upper quadrant pain radiating to the right shoulder for 1 day. EXAM: US ABDOMEN LIMITED - RIGHT UPPER QUADRANT  COMPARISON:  None. FINDINGS: Gallbladder: Physiologically distended. No gallstones or wall thickening visualized. No sonographic Murphy sign noted by sonographer. Common bile duct: Diameter: 2.7 mm. Liver: No focal lesion identified. Within normal limits in parenchymal echogenicity. Normal directional flow in the main portal vein. IMPRESSION: Normal right upper quadrant ultrasound. Electronically Signed   By: Jeb Levering M.D.   On: 01/13/2016 00:11       Filed Weights   01/12/16 1957 01/13/16 0607  Weight: 64.9 kg (143 lb 3 oz) 64.1 kg (141 lb 6.4 oz)     Microbiology: No results found for this or any previous visit (from the past 240 hour(s)).     Blood Culture No results found for: SDES, [place], CULT, REPTSTATUS    Labs: Results for orders placed or performed during the hospital encounter of 01/12/16 (from the past 48 hour(s))  Lipase, blood     Status: None   Collection Time: 01/12/16  8:31 PM  Result Value Ref Range   Lipase 28 11 - 51 U/L  Comprehensive metabolic panel     Status: None   Collection Time: 01/12/16  8:31 PM  Result Value Ref Range   Sodium 139 135 - 145 mmol/L   Potassium 3.5 3.5 - 5.1 mmol/L   Chloride  105 101 - 111 mmol/L   CO2 26 22 - 32 mmol/L   Glucose, Bld 93 65 - 99 mg/dL   BUN 11 6 - 20 mg/dL   Creatinine, Ser 0.73 0.44 - 1.00 mg/dL   Calcium 9.2 8.9 - 10.3 mg/dL   Total Protein 7.3 6.5 - 8.1 g/dL   Albumin 4.2 3.5 - 5.0 g/dL   AST 21 15 - 41 U/L   ALT 19 14 - 54 U/L   Alkaline Phosphatase 69 38 - 126 U/L   Total Bilirubin 0.5 0.3 - 1.2 mg/dL   GFR calc non Af Amer >60 >60 mL/min   GFR calc Af Amer >60 >60 mL/min    Comment: (NOTE) The eGFR has been calculated using the CKD EPI equation. This calculation has not been validated in all clinical situations. eGFR's persistently <60 mL/min signify possible Chronic Kidney Disease.    Anion gap 8 5 - 15  CBC     Status: None   Collection Time: 01/12/16  8:31 PM  Result Value Ref  Range   WBC 7.2 4.0 - 10.5 K/uL   RBC 4.23 3.87 - 5.11 MIL/uL   Hemoglobin 12.4 12.0 - 15.0 g/dL   HCT 37.9 36.0 - 46.0 %   MCV 89.6 78.0 - 100.0 fL   MCH 29.3 26.0 - 34.0 pg   MCHC 32.7 30.0 - 36.0 g/dL   RDW 12.7 11.5 - 15.5 %   Platelets 219 150 - 400 K/uL  Urinalysis, Routine w reflex microscopic     Status: Abnormal   Collection Time: 01/12/16  8:31 PM  Result Value Ref Range   Color, Urine YELLOW YELLOW   APPearance CLEAR CLEAR   Specific Gravity, Urine 1.011 1.005 - 1.030   pH 6.0 5.0 - 8.0   Glucose, UA NEGATIVE NEGATIVE mg/dL   Hgb urine dipstick NEGATIVE NEGATIVE   Bilirubin Urine NEGATIVE NEGATIVE   Ketones, ur NEGATIVE NEGATIVE mg/dL   Protein, ur NEGATIVE NEGATIVE mg/dL   Nitrite NEGATIVE NEGATIVE   Leukocytes, UA TRACE (A) NEGATIVE  Urine microscopic-add on     Status: Abnormal   Collection Time: 01/12/16  8:31 PM  Result Value Ref Range   Squamous Epithelial / LPF 0-5 (A) NONE SEEN   WBC, UA 0-5 0 - 5 WBC/hpf   RBC / HPF NONE SEEN 0 - 5 RBC/hpf   Bacteria, UA RARE (A) NONE SEEN  hCG, quantitative, pregnancy     Status: None   Collection Time: 01/12/16  8:32 PM  Result Value Ref Range   hCG, Beta Chain, Quant, S 3 <5 mIU/mL    Comment:          GEST. AGE      CONC.  (mIU/mL)   <=1 WEEK        5 - 50     2 WEEKS       50 - 500     3 WEEKS       100 - 10,000     4 WEEKS     1,000 - 30,000     5 WEEKS     3,500 - 115,000   6-8 WEEKS     12,000 - 270,000    12 WEEKS     15,000 - 220,000        FEMALE AND NON-PREGNANT FEMALE:     LESS THAN 5 mIU/mL   D-dimer, quantitative (not at [facility])     Status: Abnormal   Collection Time: 01/12/16 11:39  PM  Result Value Ref Range   D-Dimer, Quant >20.00 (H) 0.00 - 0.50 ug/mL-FEU    Comment: REPEATED TO VERIFY (NOTE) At the manufacturer cut-off of 0.50 ug/mL FEU, this assay has been documented to exclude PE with a sensitivity and negative predictive value of 97 to 99%.  At this time, this assay has not been  approved by the FDA to exclude DVT/VTE. Results should be correlated with clinical presentation.   Comprehensive metabolic panel     Status: Abnormal   Collection Time: 01/14/16  5:10 AM  Result Value Ref Range   Sodium 139 135 - 145 mmol/L   Potassium 4.0 3.5 - 5.1 mmol/L   Chloride 103 101 - 111 mmol/L   CO2 27 22 - 32 mmol/L   Glucose, Bld 141 (H) 65 - 99 mg/dL   BUN 13 6 - 20 mg/dL   Creatinine, Ser 0.81 0.44 - 1.00 mg/dL   Calcium 9.1 8.9 - 10.3 mg/dL   Total Protein 6.4 (L) 6.5 - 8.1 g/dL   Albumin 3.6 3.5 - 5.0 g/dL   AST 17 15 - 41 U/L   ALT 16 14 - 54 U/L   Alkaline Phosphatase 62 38 - 126 U/L   Total Bilirubin 0.7 0.3 - 1.2 mg/dL   GFR calc non Af Amer >60 >60 mL/min   GFR calc Af Amer >60 >60 mL/min    Comment: (NOTE) The eGFR has been calculated using the CKD EPI equation. This calculation has not been validated in all clinical situations. eGFR's persistently <60 mL/min signify possible Chronic Kidney Disease.    Anion gap 9 5 - 15  CBC     Status: Abnormal   Collection Time: 01/14/16  5:10 AM  Result Value Ref Range   WBC 5.6 4.0 - 10.5 K/uL   RBC 4.01 3.87 - 5.11 MIL/uL   Hemoglobin 11.7 (L) 12.0 - 15.0 g/dL   HCT 35.8 (L) 36.0 - 46.0 %   MCV 89.3 78.0 - 100.0 fL   MCH 29.2 26.0 - 34.0 pg   MCHC 32.7 30.0 - 36.0 g/dL   RDW 12.7 11.5 - 15.5 %   Platelets 225 150 - 400 K/uL     Lipid Panel  No results found for: CHOL, TRIG, HDL, CHOLHDL, VLDL, LDLCALC, LDLDIRECT   No results found for: HGBA1C   Lab Results  Component Value Date   CREATININE 0.81 01/14/2016     HPI :    55 y.o.femalewith a past medical history significant for hypothyroidismwho presents with chest pain.D-dimer markedly elevated, CT angiogram showed segmental PE and multiple lobes without evidence of right heart strain. Patient not particularly symptomatic, 95% on room air, hemodynamically stable, Initiated on Lovenox.She just returned from a 19 hr road trip to [place], this is  most likely cause of her DVT  HOSPITAL COURSE:   Pulmonary embolism/bilateral DVT  Venous Doppler deep vein thrombosis involving the right peroneal veins, left posterior tibial veins, and left peroneal veins.  CT chest positive for segmental pulmonary emboli in the right upper and middle lobes. No right heart strain Patient started on xarelto , will probably need to continue this for 6 months Age-appropriate screening for malignancy Had a recent mammogram that was negative  Discharge Exam:   Blood pressure (!) 89/46, pulse 62, temperature 98.3 F (36.8 C), temperature source Oral, resp. rate 18, height '5\' 5"'  (1.651 m), weight 64.1 kg (141 lb 6.4 oz), last menstrual period 09/04/2012, SpO2 97 %.  General appearance: Well-developed, adult  female, alert and in no acute distress.  Eyes: Anicteric, conjunctiva pink, lids and lashes normal.    ENT: No nasal deformity, discharge, or epistaxis.  OP moist without lesions.  Skin: Warm and dry.  No jaundice.  No suspicious rashes or lesions. Cardiac: RRR, nl S1-S2, no murmurs appreciated. Capillary refill is brisk.  JVP normal.  No LE edema.  Radial and DP pulses 2+ and symmetric.  Respiratory: Normal respiratory rate and rhythm.  Wheeze isolated on LEFT.   Breath sounds diminished on RIGHT, without crackles. Abdomen: Abdomen soft without rigidity.  No TTP.No ascites, distension.   MSK: No deformities or effusions.  No leg swelling.  Negative Homans and no calf pain. Neuro: Sensorium intact and responding to questions, attention normal. Speech is fluent.  Moves all extremities equally and with normal coordination.    Psych: Behavior appropriate.  Affect normal.  No evidence of aural or visual hallucinations or delusions.       Follow-up Information    Primary care provider. Schedule an appointment as soon as possible for a visit in 3 day(s).   Why:  Hospital follow-up          Signed: Reyne Dumas 01/14/2016, 10:26 AM        Time  spent >45 mins

## 2016-01-14 NOTE — Progress Notes (Signed)
**  Preliminary report by tech**  Evidence of deep vein thrombosis involving the right peroneal veins, left posterior tibial veins, and left peroneal veins. All other visualized vessels appear patent and compressible. No evidence of Baker's cyst on the right or left.  01/14/16 9:37 AM Sherry Williams RVT

## 2016-01-14 NOTE — Progress Notes (Signed)
Spoke with [facility] in [place], they can use cards to fill Rx

## 2016-01-14 NOTE — Care Management Note (Signed)
Case Management Note  Patient Details  Name: Sherry Williams MRN: [id number] Date of Birth: 27-May-1961  Subjective/Objective:                 Patient admitted for DVT and PE. Provided with Xaralto 30 day and 1 year copay cards. No HH needed.    Action/Plan:  DC to home self care. No further CM needs.  Expected Discharge Date:  01/14/16               Expected Discharge Plan:  Home/Self Care  In-House Referral:  NA  Discharge planning Services  CM Consult  Post Acute Care Choice:  NA Choice offered to:  NA  DME Arranged:  N/A DME Agency:  NA  HH Arranged:  NA HH Agency:  NA  Status of Service:  Completed, signed off  If discussed at [place] of Stay Meetings, dates discussed:    Additional Comments:  Carles Collet, RN 01/14/2016, 11:09 AM

## 2016-01-14 NOTE — Progress Notes (Signed)
Pt given discharge instructions, prescriptions, and care notes. Pt verbalized understanding AEB no further questions or concerns at this time. IV was discontinued, no redness, pain, or swelling noted at this time. Pt left the floor in stable condition. 

## 2016-01-14 NOTE — Progress Notes (Signed)
Patient is independent in her room,  And urines have not been documented, nor intake.

## 2016-01-18 ENCOUNTER — Ambulatory Visit (INDEPENDENT_AMBULATORY_CARE_PROVIDER_SITE_OTHER): Payer: 59 | Admitting: Family Medicine

## 2016-01-18 ENCOUNTER — Encounter: Payer: Self-pay | Admitting: Family Medicine

## 2016-01-18 ENCOUNTER — Ambulatory Visit: Payer: 59 | Admitting: Family Medicine

## 2016-01-18 VITALS — BP 110/80 | HR 68 | Temp 98.3°F | Ht 65.25 in | Wt 141.9 lb

## 2016-01-18 DIAGNOSIS — I82401 Acute embolism and thrombosis of unspecified deep veins of right lower extremity: Secondary | ICD-10-CM | POA: Diagnosis not present

## 2016-01-18 DIAGNOSIS — E063 Autoimmune thyroiditis: Secondary | ICD-10-CM

## 2016-01-18 DIAGNOSIS — I2601 Septic pulmonary embolism with acute cor pulmonale: Secondary | ICD-10-CM

## 2016-01-18 DIAGNOSIS — Z448 Encounter for fitting and adjustment of other external prosthetic devices: Secondary | ICD-10-CM | POA: Diagnosis not present

## 2016-01-18 DIAGNOSIS — E038 Other specified hypothyroidism: Secondary | ICD-10-CM

## 2016-01-18 NOTE — Progress Notes (Signed)
Subjective:     Patient ID: Sherry Williams, female   DOB: [birth year], 55 y.o.   MRN: [id number]  HPI Patient here to establish care and for hospital follow up for recent acute pulmonary embolus.  She has history of hypothyroidism followed by endocrinology.    Couple of weeks ago she noted what she thought was a "cramp" of left calf. She left for trip to [place] (drove) and while up there noted onset of what felt like cramps of right calf as well.  No significant edema.  She went for a couple of hikes and felt slightly short of breath but no chest pain.  They returned home and a couple of days prior to admission, she noted pleuritic pain one night with dyspnea and tried to sleep sitting up.  Symptoms progressed and she eventually presented to urgent care for evaluation.  She had noted some right shoulder pain and wondered is she may be having gallbladder issues .  Was transferred over to ER.  D-Dimer >20. CT chest emboli in RML and RUL.  Also mild aneurysmal dilatation of ascending aorta-3.7 cm.  No dissection.  No chest mass or adenopathy. Venous dopplers showed DVT of right and left lower extremities.  Pt started on Xarelto 15 mg bid and will transition to 20 mg qd this week No prior hx of DVT or PE Nonsmoker.  No hormone use. Sister with hx of DVT.  She is not aware of specific hereditary coagulopathy dx. She gets yearly mammograms   Colonoscopy up to date No recent appetite or weight changes.  Recent travel to [place] as known risk factor.  Past Medical History:  Diagnosis Date  . Allergy   . Depression   . Hypothyroidism    Hashimoto  . Pulmonary embolism ([place]) 01/13/2016   After car trip   Past Surgical History:  Procedure Laterality Date  . MOUTH SURGERY    . PULMONARY EMBOLISM SURGERY  2017    reports that she has never smoked. She has never used smokeless tobacco. She reports that she drinks alcohol. She reports that she does not use drugs. family history includes Alzheimer's  disease in her father; Breast cancer in her paternal grandmother; Deep vein thrombosis in her sister; Stroke in her father. No Known Allergies   Review of Systems  Constitutional: Negative for fatigue.  Eyes: Negative for visual disturbance.  Respiratory: Negative for cough, chest tightness and wheezing.   Cardiovascular: Negative for chest pain, palpitations and leg swelling.  Gastrointestinal: Negative for abdominal pain.  Genitourinary: Negative for hematuria.  Skin: Negative for rash.  Neurological: Negative for dizziness, seizures, syncope, weakness, light-headedness and headaches.  Hematological: Negative for adenopathy. Does not bruise/bleed easily.       Objective:   Physical Exam  Constitutional: She appears well-developed and well-nourished. No distress.  Neck: Neck supple. No thyromegaly present.  Cardiovascular: Normal rate and regular rhythm.  Exam reveals no gallop.   No murmur heard. Pulmonary/Chest: Effort normal and breath sounds normal. No respiratory distress. She has no wheezes. She has no rales.  Musculoskeletal: She exhibits no edema.  Lymphadenopathy:    She has no cervical adenopathy.  Skin: No rash noted.  Psychiatric: She has a normal mood and affect. Her behavior is normal.       Assessment:     #1 Acute DVT and Pulmonary emboli (first episode) following recent prolonged travel.  Clinically stable at this time.  She does not have any weight loss or other concerning symptoms for  malignancy.   #2 hypothyroidism.      Plan:     -Continue Xarelto.  Will transition to 20 mg daily later this week -we discussed possible referral to hematology to help with decision making regarding chronicity of anti-coagulants- lifelong vs 4-6 months. -suspect we would look at short term treatment vs lifelong given risk factor of travel- although exact timing of DVT not certain as she developed some left calf pain prior to any travel.    Eulas Post MD [place]  [facility] at [facility]

## 2016-01-18 NOTE — Patient Instructions (Signed)

## 2016-01-31 ENCOUNTER — Telehealth: Payer: Self-pay | Admitting: Family Medicine

## 2016-01-31 NOTE — Telephone Encounter (Signed)
[facility]  Patient Name: SHAQUAVIA GUILLEN  DOB: September 29, 1960    Initial Comment having shoulder pain, not full strength or full range of motion. had pulmonary embolism earlier this month   Nurse Assessment  Nurse: Harlow Mares, RN, Suanne Marker Date/Time Eilene Ghazi Time): 01/31/2016 2:16:59 PM  Confirm and document reason for call. If symptomatic, describe symptoms. You must click the next button to save text entered. ---having shoulder pain, not full strength or full range of motion. had pulmonary embolism earlier this month. More significant than prior to the PE. Reports her right shoulder.  Has the patient traveled out of the country within the last 30 days? ---Not Applicable  Does the patient have any new or worsening symptoms? ---Yes  Will a triage be completed? ---Yes  Related visit to physician within the last 2 weeks? ---No  Does the PT have any chronic conditions? (i.e. diabetes, asthma, etc.) ---Yes  List chronic conditions. ---hx PE, hypothyroidism, Lykin sclerosis;  Is this a behavioral health or substance abuse call? ---No     Guidelines    Guideline Title Affirmed Question Affirmed Notes  Shoulder Pain [1] MODERATE pain (e.g., interferes with normal activities) AND [2] present > 3 days    Final Disposition User   See PCP When Office is Open (within 3 days) Harlow Mares, RN, Suanne Marker    Comments  Appt scheduled for 02/01/16 @ 9:45am with Dr. Elease Hashimoto at the [place] location.   Referrals  REFERRED TO PCP OFFICE   Disagree/Comply: Comply

## 2016-01-31 NOTE — Telephone Encounter (Signed)
Noted  

## 2016-02-01 ENCOUNTER — Ambulatory Visit (INDEPENDENT_AMBULATORY_CARE_PROVIDER_SITE_OTHER): Payer: 59 | Admitting: Family Medicine

## 2016-02-01 VITALS — BP 100/72 | HR 68 | Temp 98.0°F | Ht 65.25 in | Wt 142.0 lb

## 2016-02-01 DIAGNOSIS — M25511 Pain in right shoulder: Secondary | ICD-10-CM | POA: Diagnosis not present

## 2016-02-01 NOTE — Patient Instructions (Signed)
Maintain range of motion in shoulder Avoid repetitive lifting of right shoulder Could look at steroid injection if persists- though would try to avoid since on blood thinner.

## 2016-02-01 NOTE — Progress Notes (Signed)
Pre visit review using our clinic review tool, if applicable. No additional management support is needed unless otherwise documented below in the visit note. 

## 2016-02-01 NOTE — Progress Notes (Signed)
Subjective:     Patient ID: Sherry Williams, female   DOB: 09-16-1960, 55 y.o.   MRN: [id number]  HPI Patient seen with right shoulder pain. She recalls onset near the time of her recent pulmonary emboli which are right upper lobe and right middle lobe. Does not recall any specific injury.  She does do some lifting and swimming and current shoulder pain is worse with abduction. She has not noted any cervical neck pains. No upper extremity numbness or weakness. She is currently taking Xarelto for recent DVT(s) in her legs as well as pulmonary emboli. No prior history of DVT/pulmonary emboli. She is scheduled to see hematologist in November to help with guidance regarding duration of therapy. She has resumed activities with walking and even some light jogging.  Denies prior history of shoulder difficulties. No recent injury. Mild might pain. No chest pains.  Past Medical History:  Diagnosis Date  . Allergy   . Depression   . Hypothyroidism    Hashimoto  . Pulmonary embolism ([place]) 01/13/2016   After car trip   Past Surgical History:  Procedure Laterality Date  . MOUTH SURGERY    . PULMONARY EMBOLISM SURGERY  2017    reports that she has never smoked. She has never used smokeless tobacco. She reports that she drinks alcohol. She reports that she does not use drugs. family history includes Alzheimer's disease in her father; Arthritis in her mother; Breast cancer in her paternal grandmother; Deep vein thrombosis in her sister; Diabetes in her paternal grandfather; Hyperlipidemia in her father; Lung cancer in her maternal grandmother; Pancreatic cancer in her paternal grandfather; Stroke in her father. No Known Allergies   Review of Systems  Musculoskeletal: Negative for neck pain.  Neurological: Negative for weakness and numbness.       Objective:   Physical Exam  Constitutional: She appears well-developed and well-nourished.  Cardiovascular: Normal rate and regular rhythm.    Musculoskeletal:  Right shoulder no visible swelling. No ecchymosis. Full range of motion. She has some pain with abduction against resistance greater than 90. No pain with external rotation or internal rotation.  Neurological:  No rotator cuff weakness.  2+ reflexes biceps and brachioradialis bilaterally        Assessment:     -Right shoulder pain. We explained that even though this can be sometimes referred from lung suspect current shoulder pain is more likely related to rotator cuff tendinitis. She has no evidence for weakness. Her pain is consistently reproduced with abduction    Plan:     -Maintain active range of motion -Avoid lifting activities or repetitive overhead activities which could exacerbate -ice after activities such as swimming. -Reminder to avoid nonsteroidals while on anticoagulant -We'll try to avoid steroid injections at this time and less pain persists or worsens  Eulas Post MD [facility] at [facility]

## 2016-03-31 ENCOUNTER — Telehealth: Payer: Self-pay | Admitting: Family Medicine

## 2016-03-31 DIAGNOSIS — E039 Hypothyroidism, unspecified: Secondary | ICD-10-CM

## 2016-03-31 NOTE — Telephone Encounter (Signed)
Pt would like to know if you think it is okay for her to have all of her blood work done with the hematologist and do one stick and not go to Dr. Bubba Camp the endocrinologist just to have blood work and prescription given or have everything done at your office.  Pt feels that since she is on blood thinners that she should not be stuck so many times.

## 2016-04-03 NOTE — Telephone Encounter (Signed)
Pt following up on request to see Dr Elease Hashimoto for her endo needs and blood work as well. Pt aware Dr Elease Hashimoto is out until next Monday.  Will look for a call back after that.

## 2016-04-05 NOTE — Telephone Encounter (Signed)
Please advise 

## 2016-04-09 NOTE — Telephone Encounter (Signed)
I will be happy to follow her hypothyroidism.  We could go ahead and put in future order to TSH to be added when she gets next blood draw.

## 2016-04-11 NOTE — Telephone Encounter (Signed)
Pt is aware and order placed.

## 2016-04-13 ENCOUNTER — Ambulatory Visit: Payer: 59

## 2016-04-13 ENCOUNTER — Ambulatory Visit (HOSPITAL_BASED_OUTPATIENT_CLINIC_OR_DEPARTMENT_OTHER)
Admission: RE | Admit: 2016-04-13 | Discharge: 2016-04-13 | Disposition: A | Discharge status: Home / Self Care | Payer: 59 | Source: Ambulatory Visit | Attending: Hematology & Oncology | Admitting: Hematology & Oncology

## 2016-04-13 ENCOUNTER — Ambulatory Visit (HOSPITAL_BASED_OUTPATIENT_CLINIC_OR_DEPARTMENT_OTHER): Payer: 59 | Admitting: Hematology & Oncology

## 2016-04-13 ENCOUNTER — Other Ambulatory Visit (HOSPITAL_BASED_OUTPATIENT_CLINIC_OR_DEPARTMENT_OTHER): Payer: 59

## 2016-04-13 ENCOUNTER — Encounter: Payer: Self-pay | Admitting: Hematology & Oncology

## 2016-04-13 VITALS — BP 106/74 | HR 71 | Temp 97.8°F | Resp 20 | Wt 141.0 lb

## 2016-04-13 DIAGNOSIS — I82493 Acute embolism and thrombosis of other specified deep vein of lower extremity, bilateral: Secondary | ICD-10-CM

## 2016-04-13 DIAGNOSIS — I82422 Acute embolism and thrombosis of left iliac vein: Secondary | ICD-10-CM | POA: Diagnosis not present

## 2016-04-13 DIAGNOSIS — Z7901 Long term (current) use of anticoagulants: Secondary | ICD-10-CM | POA: Insufficient documentation

## 2016-04-13 DIAGNOSIS — I2699 Other pulmonary embolism without acute cor pulmonale: Secondary | ICD-10-CM

## 2016-04-13 DIAGNOSIS — E039 Hypothyroidism, unspecified: Secondary | ICD-10-CM

## 2016-04-13 DIAGNOSIS — R319 Hematuria, unspecified: Secondary | ICD-10-CM

## 2016-04-13 DIAGNOSIS — I2601 Septic pulmonary embolism with acute cor pulmonale: Secondary | ICD-10-CM

## 2016-04-13 DIAGNOSIS — Z86718 Personal history of other venous thrombosis and embolism: Secondary | ICD-10-CM | POA: Insufficient documentation

## 2016-04-13 LAB — URINALYSIS, MICROSCOPIC (CHCC SATELLITE)
Bacteria, UA: NEGATIVE
Bilirubin (Urine): NEGATIVE
Glucose: NEGATIVE mg/dL
Ketones: NEGATIVE mg/dL
Leukocyte Esterase: NEGATIVE
Nitrite: NEGATIVE
Protein: NEGATIVE mg/dL
Specific Gravity, Urine: 1.01 (ref 1.003–1.035)
Urobilinogen, UR: 0.2 mg/dL (ref 0.2–1)
WBC: NEGATIVE (ref 0–2)
pH: 6 (ref 4.60–8.00)

## 2016-04-13 LAB — COMPREHENSIVE METABOLIC PANEL
ALT: 22 U/L (ref 0–55)
AST: 20 U/L (ref 5–34)
Albumin: 4.2 g/dL (ref 3.5–5.0)
Alkaline Phosphatase: 81 U/L (ref 40–150)
Anion Gap: 8 mEq/L (ref 3–11)
BUN: 22.1 mg/dL (ref 7.0–26.0)
CO2: 27 mEq/L (ref 22–29)
Calcium: 9.9 mg/dL (ref 8.4–10.4)
Chloride: 105 mEq/L (ref 98–109)
Creatinine: 0.8 mg/dL (ref 0.6–1.1)
EGFR: 83 mL/min/{1.73_m2} — ABNORMAL LOW (ref >90)
Glucose: 84 mg/dl (ref 70–140)
Potassium: 4.1 mEq/L (ref 3.5–5.1)
Sodium: 140 mEq/L (ref 136–145)
Total Bilirubin: 0.44 mg/dL (ref 0.20–1.20)
Total Protein: 7.7 g/dL (ref 6.4–8.3)

## 2016-04-13 LAB — CBC WITH DIFFERENTIAL (CANCER CENTER ONLY)
BASO#: 0 10*3/uL (ref 0.0–0.2)
BASO%: 0.2 % (ref 0.0–2.0)
EOS%: 1.6 % (ref 0.0–7.0)
Eosinophils Absolute: 0.1 10*3/uL (ref 0.0–0.5)
HCT: 39.9 % (ref 34.8–46.6)
HGB: 13.8 g/dL (ref 11.6–15.9)
LYMPH#: 1.2 10*3/uL (ref 0.9–3.3)
LYMPH%: 26.5 % (ref 14.0–48.0)
MCH: 30 pg (ref 26.0–34.0)
MCHC: 34.6 g/dL (ref 32.0–36.0)
MCV: 87 fL (ref 81–101)
MONO#: 0.5 10*3/uL (ref 0.1–0.9)
MONO%: 10 % (ref 0.0–13.0)
NEUT#: 2.8 10*3/uL (ref 1.5–6.5)
NEUT%: 61.7 % (ref 39.6–80.0)
Platelets: 244 10*3/uL (ref 145–400)
RBC: 4.6 10*6/uL (ref 3.70–5.32)
RDW: 12.8 % (ref 11.1–15.7)
WBC: 4.5 10*3/uL (ref 3.9–10.0)

## 2016-04-13 NOTE — Progress Notes (Signed)
Referral MD  Reason for Referral: Pulmonary embolism of the right lung-segmental arteries ;  DVT of the right peroneal vein and left posterior tibial vein/peroneal vein   Chief Complaint  Patient presents with  . Initial Visit    Pulmonary Embolism  : I went to [place] and got blood clots.  HPI: [name] is a very charming 55 year old white female. She has been in good health. She  does have hypothyroidism. She's been very active. She does exercise.  She grew up in the [place]. She has lived in many different places. It was fun talking to her about this as I grew up in the [place].  She has family up in [place]. She has lived in [place] before.  She and her husband drove up to [place] in July. They drove back in one. They did not really stop.  She was having some cramps in her legs. She started having some charley horses. These were in her lower legs. She then began to have some pain on her right chest wall. This was radiating to her shoulder. She thought that she might have gallstones. She went to urgent care. They did some lab work on her. I think they did a ultrasound which did not show any gallstones.   She was found having elevated d-dimer. This led to a CT angiogram being done. She is found to have pulmonary emboli in the right middle and right upper lobe. These were in segmental arteries. There is minimal infarct in the right middle lobe. There is no right heart strain.  She was admitted. She had Lovenox. She was started on then on Xarelto.  She had Dopplers of her legs. She is not have a right peroneal thrombus. She had a left posterior tibial and left peroneal thrombus.  She has been on Xarelto. Her legs still don't feel "right". She's had no swelling. She is still trying to exercise. Patient does state some blood in the urine.  She does not have any cough. There is no chest wall pain. There is still some discomfort over on the right side.  She has a sister who had a blood clot.  This is probably a couple years ago.  She has not been on any estrogens. She has had 4 pregnancies. She did have a miscarriage with a fifth pregnancy. This was about 3 months into the pregnancy.  She does not smoke. She has wine on occasion.  There is no weight loss or weight gain. She has her mammograms yearly. She had her colonoscopy 6 years ago.  She does take natural alternative agents.   She really has ever had surgery.  Overall, her performance status is ECOG 0.   Past Medical History:  Diagnosis Date  . Allergy   . Depression   . Hypothyroidism    Hashimoto  . Pulmonary embolism ([place]) 01/13/2016   After car trip  :  Past Surgical History:  Procedure Laterality Date  . MOUTH SURGERY    . PULMONARY EMBOLISM SURGERY  2017  :   Current Outpatient Prescriptions:  .  CHLOROPHYLL PO, Take by mouth daily., Disp: , Rfl:  .  Cholecalciferol (BIO-D-MULSION PO), Take by mouth daily., Disp: , Rfl:  .  Multiple Vitamins-Minerals (BIOTECT PLUS) CAPS, Take by mouth daily., Disp: , Rfl:  .  rivaroxaban (XARELTO) 20 MG TABS tablet, Take 20 mg by mouth daily with supper., Disp: , Rfl:  .  thyroid (ARMOUR) 60 MG tablet, Take 60 mg by mouth daily  before breakfast. Takes 60mg  3  times a week., Disp: , Rfl:  .  thyroid (ARMOUR) 90 MG tablet, Take 90 mg by mouth every evening. Takes 4 days a Week., Disp: , Rfl:  .  UNABLE TO FIND, Med Name: Neurotrans:  2-4 sprays orally prn for depression., Disp: , Rfl: :  :  Allergies  Allergen Reactions  . Bee Venom Anaphylaxis  :  Family History  Problem Relation Age of Onset  . Stroke Father   . Alzheimer's disease Father   . Hyperlipidemia Father   . Deep vein thrombosis Sister   . Breast cancer Paternal Grandmother     In her 3s, but lived to be very elderly  . Arthritis Mother   . Lung cancer Maternal Grandmother   . Diabetes Paternal Grandfather   . Pancreatic cancer Paternal Grandfather   :  Social History   Social History   . Marital status: Married    Spouse name: N/A  . Number of children: N/A  . Years of education: N/A   Occupational History  . Not on file.   Social History Main Topics  . Smoking status: Never Smoker  . Smokeless tobacco: Never Used  . Alcohol use Yes     Comment: occassional  . Drug use: No  . Sexual activity: Yes   Other Topics Concern  . Not on file   Social History Narrative  . No narrative on file  :  Pertinent items are noted in HPI.  Exam: @IPVITALS @  well-developed and well-nourished white female in no obvious distress. Vital signs show temperature of 97.8. Pulse 71. Blood pressure 106/74. Weight is 141 pounds. Head and neck exam shows no ocular or oral lesions. There are no palpable cervical or supraclavicular lymph nodes. Lungs are clear bilaterally. There are no rales, wheezes or rhonchi. Cardiac exam regular in rhythm with no murmurs, rubs or bruits. Abdomen is soft and shows good bowel sounds. There is no fluid wave. There is no palpable abdominal mass. There's no palpable liver or spleen tip. Back exam shows no tenderness over the spine or ribs or hips. Extremities shows no clubbing, cyanosis or edema. No edema is noted in the legs. She has a negative Homans's sign bilaterally. No palpable venous cords are noted in her legs. Skin exam shows no rashes, ecchymoses or petechia. Neurological exam shows no focal neurological deficits.    Recent Labs  04/13/16 1055  WBC 4.5  HGB 13.8  HCT 39.9  PLT 244    Recent Labs  04/13/16 1055  NA 140  K 4.1  CO2 27  GLUCOSE 84  BUN 22.1  CREATININE 0.8  CALCIUM 9.9    Blood smear review:  None  Pathology: None     Assessment and Plan:  [name] is a very nice 54 year old white female with thromboembolic disease in her lungs and in her legs bilaterally.  We will send off the hypercoagulable studies. We will see if she tests positive. Chances are that she will test negative. However, since a sister has had  blood clots, or maybe some that we will find.  We did check a urinalysis on her. She does have some blood in the urine. There is no gross blood. I'm sure this is probably from the Xarelto.  I am sending her down for Dopplers of her legs today and also a CT angiogram. This in 3 months. I will like to think that the pulmonary emboli have resolved. The embolic disease in her  legs still might be present.  I she will need one year of full dose anticoagulation. I tend to be more aggressive with pulmonary emboli with respect to anticoagulation duration. After that, then I would put her on one year of low-dose maintenance therapy.  If she continues to have issues with hematuria, we may have to consider a cystoscopy for her. Sometimes blood with anticoagulation in the urine could indicate a bladder problem.  I spent about an hour with her. It was a lot of fun talking to her. She is incredibly eloquent. She is very knowledgeable.  Of note, a son passed away 2 years ago from a drug overdose. This is certainly very unfortunate and I think indicative of the opioid crisis that is enveloping our country.  I will plan to get her back to see [place] in another month or so. I will call her when I get results back from her Dopplers and CT angiogram.  I answered all of her questions.

## 2016-04-14 LAB — BETA-2-GLYCOPROTEIN I ABS, IGG/M/A
Beta-2 Glyco 1 IgA: <9 GPI IgA units (ref 0–25)
Beta-2 Glyco 1 IgM: <9 GPI IgM units (ref 0–32)
Beta-2 Glycoprotein I Ab, IgG: <9 GPI IgG units (ref 0–20)

## 2016-04-14 LAB — PROTEIN C ACTIVITY: Protein C-Functional: 129 % (ref 73–180)

## 2016-04-14 LAB — ANTITHROMBIN III: Antithrombin Activity: 128 % (ref 75–135)

## 2016-04-14 LAB — PROTEIN S, TOTAL: Protein S, Total: 93 % (ref 60–150)

## 2016-04-14 LAB — PROTEIN S ACTIVITY: Protein S-Functional: 174 % — ABNORMAL HIGH (ref 63–140)

## 2016-04-15 LAB — CARDIOLIPIN ANTIBODIES, IGG, IGM, IGA
Anticardiolipin Ab,IgA,Qn: <9 APL U/mL (ref 0–11)
Anticardiolipin Ab,IgG,Qn: <9 GPL U/mL (ref 0–14)
Anticardiolipin Ab,IgM,Qn: <9 MPL U/mL (ref 0–12)

## 2016-04-15 LAB — LUPUS ANTICOAGULANT PANEL
PTT-LA: 35.8 s (ref 0.0–51.9)
dRVVT Confirm: 1.2 ratio (ref 0.8–1.2)
dRVVT Mix: 50 s — ABNORMAL HIGH (ref 0.0–47.0)
dRVVT: 61.1 s — ABNORMAL HIGH (ref 0.0–47.0)

## 2016-04-15 LAB — PROTEIN C, TOTAL: Protein C Antigen: 86 % (ref 60–150)

## 2016-04-18 LAB — PROTHROMBIN GENE MUTATION

## 2016-04-19 LAB — FACTOR 5 LEIDEN

## 2016-04-20 ENCOUNTER — Ambulatory Visit (HOSPITAL_BASED_OUTPATIENT_CLINIC_OR_DEPARTMENT_OTHER)
Admission: RE | Admit: 2016-04-20 | Discharge: 2016-04-20 | Disposition: A | Discharge status: Home / Self Care | Payer: 59 | Source: Ambulatory Visit | Attending: Hematology & Oncology | Admitting: Hematology & Oncology

## 2016-04-20 ENCOUNTER — Encounter (HOSPITAL_BASED_OUTPATIENT_CLINIC_OR_DEPARTMENT_OTHER): Payer: Self-pay

## 2016-04-20 DIAGNOSIS — I2601 Septic pulmonary embolism with acute cor pulmonale: Secondary | ICD-10-CM

## 2016-04-20 DIAGNOSIS — Z86711 Personal history of pulmonary embolism: Secondary | ICD-10-CM | POA: Insufficient documentation

## 2016-04-20 DIAGNOSIS — R319 Hematuria, unspecified: Secondary | ICD-10-CM

## 2016-04-20 MED ORDER — IOPAMIDOL (ISOVUE-370) INJECTION 76%
100.0000 mL | Freq: Once | INTRAVENOUS | Status: AC | PRN
  Administered 2016-04-20: 100 mL via INTRAVENOUS

## 2016-04-21 ENCOUNTER — Telehealth: Payer: Self-pay | Admitting: Family Medicine

## 2016-04-21 NOTE — Telephone Encounter (Signed)
1.  Pt has a mammogram scheduled Nov 15.  Pt would like to know if she should have this since being on Xarelto has caused her severe bruising.  In other words, is it ok for pt to postpone this until she is off this med. Pt will have to be on Xarelto until at least next August.  2. pt states she would like to get her TSH done at Dr Antonieta Pert office. But even though it is in the system, they need a written rx sent to them. OK to fax.  Ok to use [employer]

## 2016-04-21 NOTE — Telephone Encounter (Signed)
OK to order TSH to get at follow up with Dr Marin Olp.  Decision to postpone mammogram is hers. There is obviously some increased risk of bruising on Xarelto, but no contraindication for mammogram.

## 2016-04-21 NOTE — Telephone Encounter (Signed)
Please review

## 2016-04-26 ENCOUNTER — Other Ambulatory Visit: Payer: Self-pay | Admitting: *Deleted

## 2016-04-26 DIAGNOSIS — E063 Autoimmune thyroiditis: Secondary | ICD-10-CM

## 2016-04-26 NOTE — Telephone Encounter (Signed)
Can you write order for TSH on prescription pad to fax. Thanks.

## 2016-04-26 NOTE — Telephone Encounter (Signed)
Tried calling pt with NA. 

## 2016-04-26 NOTE — Telephone Encounter (Signed)
done

## 2016-04-27 NOTE — Telephone Encounter (Signed)
Pt is aware of annotations. Order has been faxed.

## 2016-05-15 ENCOUNTER — Ambulatory Visit (HOSPITAL_BASED_OUTPATIENT_CLINIC_OR_DEPARTMENT_OTHER): Payer: 59 | Admitting: Hematology & Oncology

## 2016-05-15 ENCOUNTER — Other Ambulatory Visit (HOSPITAL_BASED_OUTPATIENT_CLINIC_OR_DEPARTMENT_OTHER): Payer: 59

## 2016-05-15 VITALS — BP 116/60 | HR 64 | Temp 97.9°F | Resp 18 | Wt 142.1 lb

## 2016-05-15 DIAGNOSIS — Z7901 Long term (current) use of anticoagulants: Secondary | ICD-10-CM | POA: Diagnosis not present

## 2016-05-15 DIAGNOSIS — I2782 Chronic pulmonary embolism: Secondary | ICD-10-CM

## 2016-05-15 DIAGNOSIS — E038 Other specified hypothyroidism: Secondary | ICD-10-CM | POA: Diagnosis not present

## 2016-05-15 DIAGNOSIS — E063 Autoimmune thyroiditis: Secondary | ICD-10-CM

## 2016-05-15 DIAGNOSIS — I2699 Other pulmonary embolism without acute cor pulmonale: Secondary | ICD-10-CM

## 2016-05-15 DIAGNOSIS — I82401 Acute embolism and thrombosis of unspecified deep veins of right lower extremity: Secondary | ICD-10-CM

## 2016-05-15 NOTE — Progress Notes (Signed)
Hematology and Oncology Follow Up Visit  Norlene Hodgkin [id number] August 12, 1960 55 y.o. 05/15/2016   Principle Diagnosis:   Idiopathic pulmonary embolism/right LE DVT  Current Therapy:    Xarelto 20 mg po q day - complete 6 months in 08/2016     Interim History:  Ms. Ruisi is back for follow-up. We first saw her in early November. We saw her, we did a CT angiogram and Doppler of her legs. Thankfully, there is no residual pulmonary embolism and no evidence of thrombo-embolic disease in her legs.  We did check a hypercoagulable panel on her. All this was negative.  She's not had any problems with obvious hematuria. This is been an issue in the past.  She feels well. She had a cold a week or so ago. She thinks this may been due to pulling up carpeting. She is feeling better.  She had a good Thanksgiving. She found out that she will be a grandmother next year.  She's had no chest wall pain. She's had no cough area she's had no shortness of breath. There's been no leg swelling. She's had no nausea or vomiting. She's had no problems with bowels or bladder. Again, she does not noted any hematuria.  Overall, her performance status is ECOG 0.  Medications:  Current Outpatient Prescriptions:  .  CHLOROPHYLL PO, Take by mouth daily., Disp: , Rfl:  .  Cholecalciferol (BIO-D-MULSION PO), Take by mouth daily., Disp: , Rfl:  .  Multiple Vitamins-Minerals (BIOTECT PLUS) CAPS, Take by mouth daily., Disp: , Rfl:  .  rivaroxaban (XARELTO) 20 MG TABS tablet, Take 20 mg by mouth daily with supper., Disp: , Rfl:  .  thyroid (ARMOUR) 60 MG tablet, Take 60 mg by mouth daily before breakfast. Takes 60mg  3  times a week., Disp: , Rfl:  .  thyroid (ARMOUR) 90 MG tablet, Take 90 mg by mouth every evening. Takes 4 days a Week., Disp: , Rfl:  .  UNABLE TO FIND, Med Name: Neurotrans:  2-4 sprays orally prn for depression., Disp: , Rfl:   Allergies:  Allergies  Allergen Reactions  . Bee Venom  Anaphylaxis    Past Medical History, Surgical history, Social history, and Family History were reviewed and updated.  Review of Systems:  As above  Physical Exam:  weight is 142 lb 1.9 oz (64.5 kg). Her oral temperature is 97.9 F (36.6 C). Her blood pressure is 116/60 and her pulse is 64. Her respiration is 18 and oxygen saturation is 99%.   Wt Readings from Last 3 Encounters:  05/15/16 142 lb 1.9 oz (64.5 kg)  04/13/16 141 lb (64 kg)  02/01/16 142 lb (64.4 kg)      Well-developed and well-nourished white female in no obvious distress. Head and neck exam shows no ocular or oral lesions. She has no palpable cervical or supraclavicular lymph nodes. Lungs are clear bilaterally. Cardiac exam regular rate and rhythm with no murmurs, rubs or bruits. Abdomen is soft. She has good bowel sounds. There is no fluid wave. There is no palpable liver or spleen tip. Back exam shows no tenderness over the spine, ribs or hips. Extremities shows no clubbing, cyanosis or edema. She has no palpable venous cord in her legs. She has a negative Homans sign. Neurological exam shows no focal neurological deficits per skin exam shows no rashes, ecchymoses or petechia.  Lab Results  Component Value Date   WBC 4.5 04/13/2016   HGB 13.8 04/13/2016   HCT 39.9 04/13/2016  MCV 87 04/13/2016   PLT 244 04/13/2016     Chemistry      Component Value Date/Time   NA 140 04/13/2016 1055   K 4.1 04/13/2016 1055   CL 103 01/14/2016 0510   CO2 27 04/13/2016 1055   BUN 22.1 04/13/2016 1055   CREATININE 0.8 04/13/2016 1055      Component Value Date/Time   CALCIUM 9.9 04/13/2016 1055   ALKPHOS 81 04/13/2016 1055   AST 20 04/13/2016 1055   ALT 22 04/13/2016 1055   BILITOT 0.44 04/13/2016 1055         Impression and Plan: Ms. Riopelle is a 55 year old white female. She has idiopathic point emboli and right lower extremity thrombus. These have resolved by her last scans.  We will keep her on full dose Xarelto  for another 3 months. I will them have her on maintenance Xarelto for a year.  She's had no problems with hematuria. I think that this becomes an issue, she probably needs to see urology and a possible cystoscopy.  I am just glad that the scans did not show any residual thromboembolic disease.  We will get her back in 3 months. We'll check labs on her at that point.  She can always give [place] a call if she has any issues.   [name], MD 12/4/20174:50 PM

## 2016-05-16 ENCOUNTER — Telehealth: Payer: Self-pay | Admitting: *Deleted

## 2016-05-16 LAB — TSH: TSH: 0.609 m(IU)/L (ref 0.308–3.960)

## 2016-05-16 NOTE — Telephone Encounter (Addendum)
Patient aware of results  ----- Message from [name], MD sent at 05/16/2016  1:33 PM EST ----- Call - the thyroid level is ok!!  Please fax to her family MD.  pete

## 2016-08-02 ENCOUNTER — Other Ambulatory Visit: Payer: Self-pay | Admitting: *Deleted

## 2016-08-14 ENCOUNTER — Ambulatory Visit (HOSPITAL_BASED_OUTPATIENT_CLINIC_OR_DEPARTMENT_OTHER): Payer: 59 | Admitting: Hematology & Oncology

## 2016-08-14 ENCOUNTER — Other Ambulatory Visit (HOSPITAL_BASED_OUTPATIENT_CLINIC_OR_DEPARTMENT_OTHER): Payer: 59

## 2016-08-14 VITALS — BP 110/67 | HR 64 | Temp 98.1°F | Wt 148.0 lb

## 2016-08-14 DIAGNOSIS — I82402 Acute embolism and thrombosis of unspecified deep veins of left lower extremity: Secondary | ICD-10-CM

## 2016-08-14 DIAGNOSIS — I2782 Chronic pulmonary embolism: Secondary | ICD-10-CM | POA: Diagnosis not present

## 2016-08-14 DIAGNOSIS — E063 Autoimmune thyroiditis: Secondary | ICD-10-CM | POA: Diagnosis not present

## 2016-08-14 DIAGNOSIS — I2699 Other pulmonary embolism without acute cor pulmonale: Secondary | ICD-10-CM

## 2016-08-14 DIAGNOSIS — Z7901 Long term (current) use of anticoagulants: Secondary | ICD-10-CM

## 2016-08-14 LAB — CBC WITH DIFFERENTIAL (CANCER CENTER ONLY)
BASO#: 0 10*3/uL (ref 0.0–0.2)
BASO%: 0.3 % (ref 0.0–2.0)
EOS%: 1 % (ref 0.0–7.0)
Eosinophils Absolute: 0 10*3/uL (ref 0.0–0.5)
HCT: 36.4 % (ref 34.8–46.6)
HGB: 12.4 g/dL (ref 11.6–15.9)
LYMPH#: 1.3 10*3/uL (ref 0.9–3.3)
LYMPH%: 32.8 % (ref 14.0–48.0)
MCH: 30.5 pg (ref 26.0–34.0)
MCHC: 34.1 g/dL (ref 32.0–36.0)
MCV: 89 fL (ref 81–101)
MONO#: 0.3 10*3/uL (ref 0.1–0.9)
MONO%: 7.8 % (ref 0.0–13.0)
NEUT#: 2.3 10*3/uL (ref 1.5–6.5)
NEUT%: 58.1 % (ref 39.6–80.0)
Platelets: 237 10*3/uL (ref 145–400)
RBC: 4.07 10*6/uL (ref 3.70–5.32)
RDW: 12.9 % (ref 11.1–15.7)
WBC: 3.9 10*3/uL (ref 3.9–10.0)

## 2016-08-14 LAB — URINALYSIS, MICROSCOPIC (CHCC SATELLITE)
Bilirubin (Urine): NEGATIVE
Blood: NEGATIVE
Glucose: NEGATIVE mg/dL
Ketones: NEGATIVE mg/dL
Leukocyte Esterase: NEGATIVE
Nitrite: NEGATIVE
Protein: NEGATIVE mg/dL
Specific Gravity, Urine: 1.005 (ref 1.003–1.035)
Urobilinogen, UR: 0.2 mg/dL (ref 0.2–1)
pH: 7 (ref 4.60–8.00)

## 2016-08-14 LAB — CMP (CANCER CENTER ONLY)
ALT(SGPT): 26 U/L (ref 10–47)
AST: 28 U/L (ref 11–38)
Albumin: 4.1 g/dL (ref 3.3–5.5)
Alkaline Phosphatase: 81 U/L (ref 26–84)
BUN, Bld: 11 mg/dL (ref 7–22)
CO2: 29 mEq/L (ref 18–33)
Calcium: 9.1 mg/dL (ref 8.0–10.3)
Chloride: 102 mEq/L (ref 98–108)
Creat: 1 mg/dl (ref 0.6–1.2)
Glucose, Bld: 138 mg/dL — ABNORMAL HIGH (ref 73–118)
Potassium: 3.8 mEq/L (ref 3.3–4.7)
Sodium: 143 mEq/L (ref 128–145)
Total Bilirubin: 0.7 mg/dl (ref 0.20–1.60)
Total Protein: 6.7 g/dL (ref 6.4–8.1)

## 2016-08-14 NOTE — Progress Notes (Signed)
Hematology and Oncology Follow Up Visit  Milda Towles [id number] 1961/03/10 56 y.o. 08/14/2016   Principle Diagnosis:   Idiopathic pulmonary embolism/right LE DVT  Current Therapy:    Xarelto 20 mg po q day - complete 6 months in [id number]  Omega -3/garlic pills for "natural" anti-coagulation     Interim History:  Ms. Hinzman is back for follow-up. She is doing okay. Her parents came down from [place]. They will be here all month. They will be going out to a movie and dinner tonight.  She will finish up her therapeutic anticoagulation this month. She really does not want to go on maintenance therapy. She wants to try "natural" alternatives. I don't have any problems with this. I told her that omega-3 fatty acids and garlic pills can provide natural anticoagulation. I told her that she must drink a lot of water as this also is very effective for anticoagulation.  She's had no bleeding. She's had no hematuria. She's had no nausea or vomiting.  She is still working.   She is planning to go to [facility] in April. I told her that she must get up and walk around on the plane every hour or so. She also must drink a lot of water on the flight.   Overall, her performance status is ECOG 0.  Medications:  Current Outpatient Prescriptions:  .  CHLOROPHYLL PO, Take by mouth daily., Disp: , Rfl:  .  Cholecalciferol (BIO-D-MULSION PO), Take by mouth daily., Disp: , Rfl:  .  Multiple Vitamins-Minerals (BIOTECT PLUS) CAPS, Take by mouth daily., Disp: , Rfl:  .  rivaroxaban (XARELTO) 20 MG TABS tablet, Take 20 mg by mouth daily with supper., Disp: , Rfl:  .  thyroid (ARMOUR) 60 MG tablet, Take 60 mg by mouth daily before breakfast. Takes 60mg  3  times a week., Disp: , Rfl:  .  thyroid (ARMOUR) 90 MG tablet, Take 90 mg by mouth every evening. Takes 4 days a Week., Disp: , Rfl:  .  UNABLE TO FIND, Med Name: Neurotrans:  2-4 sprays orally prn for depression., Disp: , Rfl:   Allergies:  Allergies    Allergen Reactions  . Bee Venom Anaphylaxis    Past Medical History, Surgical history, Social history, and Family History were reviewed and updated.  Review of Systems:  As above  Physical Exam:  weight is 148 lb (67.1 kg). Her oral temperature is 98.1 F (36.7 C). Her blood pressure is 110/67 and her pulse is 64.   Wt Readings from Last 3 Encounters:  08/14/16 148 lb (67.1 kg)  05/15/16 142 lb 1.9 oz (64.5 kg)  04/13/16 141 lb (64 kg)      Well-developed and well-nourished white female in no obvious distress. Head and neck exam shows no ocular or oral lesions. She has no palpable cervical or supraclavicular lymph nodes. Lungs are clear bilaterally. Cardiac exam regular rate and rhythm with no murmurs, rubs or bruits. Abdomen is soft. She has good bowel sounds. There is no fluid wave. There is no palpable liver or spleen tip. Back exam shows no tenderness over the spine, ribs or hips. Extremities shows no clubbing, cyanosis or edema. She has no palpable venous cord in her legs. She has a negative Homans sign. Neurological exam shows no focal neurological deficits per skin exam shows no rashes, ecchymoses or petechia.  Lab Results  Component Value Date   WBC 3.9 08/14/2016   HGB 12.4 08/14/2016   HCT 36.4 08/14/2016   MCV  89 08/14/2016   PLT 237 08/14/2016     Chemistry      Component Value Date/Time   NA 143 08/14/2016 1517   NA 140 04/13/2016 1055   K 3.8 08/14/2016 1517   K 4.1 04/13/2016 1055   CL 102 08/14/2016 1517   CO2 29 08/14/2016 1517   CO2 27 04/13/2016 1055   BUN 11 08/14/2016 1517   BUN 22.1 04/13/2016 1055   CREATININE 1.0 08/14/2016 1517   CREATININE 0.8 04/13/2016 1055      Component Value Date/Time   CALCIUM 9.1 08/14/2016 1517   CALCIUM 9.9 04/13/2016 1055   ALKPHOS 81 08/14/2016 1517   ALKPHOS 81 04/13/2016 1055   AST 28 08/14/2016 1517   AST 20 04/13/2016 1055   ALT 26 08/14/2016 1517   ALT 22 04/13/2016 1055   BILITOT 0.70 08/14/2016 1517    BILITOT 0.44 04/13/2016 1055         Impression and Plan: Ms. Bartron is a 55 year old white female. She has idiopathic pulmonary emboli and right lower extremity thrombus. These have resolved by her last scans.   she will finish up her therapeutic anticoagulation this month. She'll then go on to alternative therapies. I don't have any problems with her on these. She does not want to take baby aspirin as she has Hashimoto's thyroiditis. I never thought that aspirin was a contraindication to this.   I will like to see her back in 6 months. I think everything was good in 6 months, then we can let her go from the clinic as we really are not doing much extra for her overall health care.   As always, it was nice talking to her.   [name], MD 3/5/20184:00 PM

## 2016-08-16 LAB — D-DIMER, QUANTITATIVE: D-DIMER: <0.2 mg{FEU}/L (ref 0.00–0.49)

## 2017-01-23 ENCOUNTER — Encounter: Payer: Self-pay | Admitting: Family Medicine

## 2017-01-23 ENCOUNTER — Ambulatory Visit (INDEPENDENT_AMBULATORY_CARE_PROVIDER_SITE_OTHER): Payer: 59 | Admitting: Family Medicine

## 2017-01-23 VITALS — BP 102/70 | HR 72 | Temp 98.3°F | Wt 144.6 lb

## 2017-01-23 DIAGNOSIS — E063 Autoimmune thyroiditis: Secondary | ICD-10-CM | POA: Diagnosis not present

## 2017-01-23 DIAGNOSIS — E039 Hypothyroidism, unspecified: Secondary | ICD-10-CM

## 2017-01-23 MED ORDER — THYROID 90 MG PO TABS: 90.0000 mg | ORAL_TABLET | Freq: Every evening | ORAL | 2 refills | Status: DC

## 2017-01-23 NOTE — Progress Notes (Signed)
Subjective:     Patient ID: Sherry Williams, female   DOB: [birth year], 56 y.o.   MRN: [id number]  HPI Patient seen for follow-up regarding hypothyroidism. She had labs done last December with normal TSH. She takes Armour thyroid 90 mg 4 days a week and 60 mg 3 days of the week. She needs refills. She is compliant with therapy. Generally feels well. She has had some mild weight gain over the past year. She still walks regularly for exercise. No overt symptoms of hypothyroidism.  First grandchild couple months ago and is very excited about that  Patient had pulmonary embolus last year. She has now off Xarelto with no recurrent issues  Past Medical History:  Diagnosis Date  . Allergy   . Depression   . Hypothyroidism    Hashimoto  . Pulmonary embolism ([place]) 01/13/2016   After car trip   Past Surgical History:  Procedure Laterality Date  . MOUTH SURGERY    . PULMONARY EMBOLISM SURGERY  2017    reports that she has never smoked. She has never used smokeless tobacco. She reports that she drinks alcohol. She reports that she does not use drugs. family history includes Alzheimer's disease in her father; Arthritis in her mother; Breast cancer in her paternal grandmother; Deep vein thrombosis in her sister; Diabetes in her paternal grandfather; Hyperlipidemia in her father; Lung cancer in her maternal grandmother; Pancreatic cancer in her paternal grandfather; Stroke in her father. Allergies  Allergen Reactions  . Bee Venom Anaphylaxis     Review of Systems  Constitutional: Negative for appetite change, fatigue and unexpected weight change.  Respiratory: Negative for shortness of breath.   Endocrine: Negative for cold intolerance.       Objective:   Physical Exam  Constitutional: She appears well-developed and well-nourished.  Neck: Neck supple. No thyromegaly present.  Cardiovascular: Normal rate and regular rhythm.   Pulmonary/Chest: Effort normal and breath sounds normal. No  respiratory distress. She has no wheezes. She has no rales.  Musculoskeletal: She exhibits no edema.       Assessment:     Hypothyroidism    Plan:     -Future labs with TSH in September when she gets blood drawn through oncologist -Refill Armour Thyroid and she will continue with current regimen of 90 mg 4 days a week and 60 mg 3 days a week unless dictated by lab work  Eulas Post MD [facility]

## 2017-02-15 ENCOUNTER — Other Ambulatory Visit (HOSPITAL_BASED_OUTPATIENT_CLINIC_OR_DEPARTMENT_OTHER): Payer: 59

## 2017-02-15 ENCOUNTER — Ambulatory Visit (HOSPITAL_BASED_OUTPATIENT_CLINIC_OR_DEPARTMENT_OTHER): Payer: 59 | Admitting: Hematology & Oncology

## 2017-02-15 VITALS — BP 113/61 | HR 59 | Temp 98.2°F | Wt 144.1 lb

## 2017-02-15 DIAGNOSIS — Z86711 Personal history of pulmonary embolism: Secondary | ICD-10-CM | POA: Diagnosis not present

## 2017-02-15 DIAGNOSIS — I2699 Other pulmonary embolism without acute cor pulmonale: Secondary | ICD-10-CM | POA: Diagnosis not present

## 2017-02-15 DIAGNOSIS — E063 Autoimmune thyroiditis: Secondary | ICD-10-CM

## 2017-02-15 DIAGNOSIS — I2782 Chronic pulmonary embolism: Secondary | ICD-10-CM | POA: Diagnosis not present

## 2017-02-15 DIAGNOSIS — E039 Hypothyroidism, unspecified: Secondary | ICD-10-CM

## 2017-02-15 DIAGNOSIS — I2602 Saddle embolus of pulmonary artery with acute cor pulmonale: Secondary | ICD-10-CM

## 2017-02-15 DIAGNOSIS — Z86718 Personal history of other venous thrombosis and embolism: Secondary | ICD-10-CM

## 2017-02-15 LAB — CMP (CANCER CENTER ONLY)
ALT(SGPT): 24 U/L (ref 10–47)
AST: 26 U/L (ref 11–38)
Albumin: 3.7 g/dL (ref 3.3–5.5)
Alkaline Phosphatase: 79 U/L (ref 26–84)
BUN, Bld: 17 mg/dL (ref 7–22)
CO2: 29 mEq/L (ref 18–33)
Calcium: 9.3 mg/dL (ref 8.0–10.3)
Chloride: 107 mEq/L (ref 98–108)
Creat: 1 mg/dl (ref 0.6–1.2)
Glucose, Bld: 98 mg/dL (ref 73–118)
Potassium: 4.1 mEq/L (ref 3.3–4.7)
Sodium: 142 mEq/L (ref 128–145)
Total Bilirubin: 0.6 mg/dl (ref 0.20–1.60)
Total Protein: 6.5 g/dL (ref 6.4–8.1)

## 2017-02-15 LAB — CBC WITH DIFFERENTIAL (CANCER CENTER ONLY)
BASO#: 0 10*3/uL (ref 0.0–0.2)
BASO%: 0.2 % (ref 0.0–2.0)
EOS%: 1.3 % (ref 0.0–7.0)
Eosinophils Absolute: 0.1 10*3/uL (ref 0.0–0.5)
HCT: 35.5 % (ref 34.8–46.6)
HGB: 12 g/dL (ref 11.6–15.9)
LYMPH#: 1.6 10*3/uL (ref 0.9–3.3)
LYMPH%: 33.3 % (ref 14.0–48.0)
MCH: 30.7 pg (ref 26.0–34.0)
MCHC: 33.8 g/dL (ref 32.0–36.0)
MCV: 91 fL (ref 81–101)
MONO#: 0.4 10*3/uL (ref 0.1–0.9)
MONO%: 8.1 % (ref 0.0–13.0)
NEUT#: 2.7 10*3/uL (ref 1.5–6.5)
NEUT%: 57.1 % (ref 39.6–80.0)
Platelets: 234 10*3/uL (ref 145–400)
RBC: 3.91 10*6/uL (ref 3.70–5.32)
RDW: 12.6 % (ref 11.1–15.7)
WBC: 4.8 10*3/uL (ref 3.9–10.0)

## 2017-02-15 NOTE — Progress Notes (Signed)
Hematology and Oncology Follow Up Visit  Husna Krone [id number] April 07, 1961 56 y.o. 02/15/2017   Principle Diagnosis:   Idiopathic pulmonary embolism/right LE DVT  Current Therapy:    Xarelto 20 mg po q day - complete 6 months in 84/1324  Omega -3/garlic pills for "natural" anti-coagulation     Interim History:  Ms. Dieu is back for follow-up. She is doing quite well. She is now a new grandmother. She had a grandson born in June. Everybody was healthy.  She is working. She is exercising. She really is staying active.  She completed Xarelto back in March.  Her last d-dimer back in February was less than 2.0.  She does have some thyroid issues. We do check her thyroid function when she is here. We last saw her, her TSH was 0.7.  She's had no bleeding. She's had no fever. She's had no nausea or vomiting. She did state that her face broke out when she was down in [place]. She was down with a friend. They've had a salt water pool. It seems like there may been some impurities in the pool that may have triggered this rash.  Her appetite is good. She watches what she eats very carefully.  Overall, her performance status is ECOG 0.   Medications:  Current Outpatient Prescriptions:  Marland Kitchen  Multiple Vitamins-Minerals (BIOTECT PLUS) CAPS, Take by mouth daily., Disp: , Rfl:  .  thyroid (ARMOUR) 90 MG tablet, Take 1 tablet (90 mg total) by mouth every evening. Take 90 mg 4 days per week and 60 mg three days per week., Disp: 90 tablet, Rfl: 2  Allergies:  Allergies  Allergen Reactions  . Bee Venom Anaphylaxis    Past Medical History, Surgical history, Social history, and Family History were reviewed and updated.  Review of Systems:  As stated in the interim history  Physical Exam:  weight is 144 lb 1 oz (65.3 kg). Her oral temperature is 98.2 F (36.8 C). Her blood pressure is 113/61 and her pulse is 59 (abnormal).   Wt Readings from Last 3 Encounters:  02/15/17 144 lb 1 oz  (65.3 kg)  01/23/17 144 lb 9.6 oz (65.6 kg)  08/14/16 148 lb (67.1 kg)     Physical Exam  Constitutional: She is oriented to person, place, and time.  HENT:  Head: Normocephalic and atraumatic.  Mouth/Throat: Oropharynx is clear and moist.  Eyes: Pupils are equal, round, and reactive to light. EOM are normal.  Neck: Normal range of motion.  Cardiovascular: Normal rate, regular rhythm and normal heart sounds.   Pulmonary/Chest: Effort normal and breath sounds normal.  Abdominal: Soft. Bowel sounds are normal.  Musculoskeletal: Normal range of motion. She exhibits no edema, tenderness or deformity.  Lymphadenopathy:    She has no cervical adenopathy.  Neurological: She is alert and oriented to person, place, and time.  Skin: Skin is warm and dry. No rash noted. No erythema.  Psychiatric: She has a normal mood and affect. Her behavior is normal. Judgment and thought content normal.  Vitals reviewed.    Lab Results  Component Value Date   WBC 4.8 02/15/2017   HGB 12.0 02/15/2017   HCT 35.5 02/15/2017   MCV 91 02/15/2017   PLT 234 02/15/2017     Chemistry      Component Value Date/Time   NA 143 08/14/2016 1517   NA 140 04/13/2016 1055   K 3.8 08/14/2016 1517   K 4.1 04/13/2016 1055   CL 102 08/14/2016 1517  CO2 29 08/14/2016 1517   CO2 27 04/13/2016 1055   BUN 11 08/14/2016 1517   BUN 22.1 04/13/2016 1055   CREATININE 1.0 08/14/2016 1517   CREATININE 0.8 04/13/2016 1055      Component Value Date/Time   CALCIUM 9.1 08/14/2016 1517   CALCIUM 9.9 04/13/2016 1055   ALKPHOS 81 08/14/2016 1517   ALKPHOS 81 04/13/2016 1055   AST 28 08/14/2016 1517   AST 20 04/13/2016 1055   ALT 26 08/14/2016 1517   ALT 22 04/13/2016 1055   BILITOT 0.70 08/14/2016 1517   BILITOT 0.44 04/13/2016 1055         Impression and Plan: Ms. Oguin is a 56 year old white female. She has idiopathic pulmonary emboli and right lower extremity thrombus. These have resolved by her last  scans.   From my point of view, everything looks fantastic. We will see what her d-dimer is. She likes to know what the d-dimer is.   We will get her back in 6 months. If followed good in 6 months, then we will let her go from our clinic. I'm just not sure that we would really be adding to her medical care.   As always, we have a nice chat. We talked about family. It is a was fun talking with her.   [name], MD

## 2017-02-16 ENCOUNTER — Telehealth: Payer: Self-pay | Admitting: *Deleted

## 2017-02-16 LAB — TSH CHCC: TSH: 0.498 u[IU]/mL (ref 0.450–4.500)

## 2017-02-16 LAB — D-DIMER, QUANTITATIVE (NOT AT ARMC): D-DIMER: <0.2 mg/L FEU (ref 0.00–0.49)

## 2017-02-16 NOTE — Telephone Encounter (Addendum)
Patient is aware of results  ----- Message from [name], MD sent at 02/16/2017 10:46 AM EDT ----- Call - D-Dimer is totally normal!!  It is < 0.20!!  Great job!!  Lattie Haw, Rudell Cobb, MD  P Onc Nurse Hp        Call - thyroid level is ok!!!! pete

## 2017-03-01 ENCOUNTER — Encounter: Payer: Self-pay | Admitting: Family Medicine

## 2017-03-06 MED ORDER — THYROID 60 MG PO TABS
ORAL_TABLET | ORAL | 2 refills | Status: DC
Start: 2017-03-06 — End: 2018-02-19

## 2017-03-06 MED ORDER — THYROID 90 MG PO TABS: ORAL_TABLET | ORAL | 2 refills | Status: DC

## 2017-03-07 DIAGNOSIS — Z0189 Encounter for other specified special examinations: Secondary | ICD-10-CM | POA: Diagnosis not present

## 2017-03-07 DIAGNOSIS — L0889 Other specified local infections of the skin and subcutaneous tissue: Secondary | ICD-10-CM | POA: Diagnosis not present

## 2017-03-07 DIAGNOSIS — R21 Rash and other nonspecific skin eruption: Secondary | ICD-10-CM | POA: Diagnosis not present

## 2017-03-07 DIAGNOSIS — C541 Malignant neoplasm of endometrium: Secondary | ICD-10-CM | POA: Diagnosis not present

## 2017-03-07 DIAGNOSIS — N950 Postmenopausal bleeding: Secondary | ICD-10-CM | POA: Diagnosis not present

## 2017-03-13 ENCOUNTER — Encounter: Payer: Self-pay | Admitting: Hematology & Oncology

## 2017-03-14 ENCOUNTER — Encounter: Payer: Self-pay | Admitting: Gynecologic Oncology

## 2017-03-14 ENCOUNTER — Ambulatory Visit: Payer: 59 | Attending: Gynecologic Oncology | Admitting: Gynecologic Oncology

## 2017-03-14 VITALS — BP 132/60 | HR 54 | Temp 97.8°F | Resp 18 | Ht 65.75 in | Wt 142.7 lb

## 2017-03-14 DIAGNOSIS — E063 Autoimmune thyroiditis: Secondary | ICD-10-CM | POA: Diagnosis not present

## 2017-03-14 DIAGNOSIS — L900 Lichen sclerosus et atrophicus: Secondary | ICD-10-CM | POA: Insufficient documentation

## 2017-03-14 DIAGNOSIS — C541 Malignant neoplasm of endometrium: Secondary | ICD-10-CM

## 2017-03-14 DIAGNOSIS — F329 Major depressive disorder, single episode, unspecified: Secondary | ICD-10-CM | POA: Insufficient documentation

## 2017-03-14 DIAGNOSIS — Z86711 Personal history of pulmonary embolism: Secondary | ICD-10-CM

## 2017-03-14 DIAGNOSIS — Z7901 Long term (current) use of anticoagulants: Secondary | ICD-10-CM | POA: Insufficient documentation

## 2017-03-14 DIAGNOSIS — Z22322 Carrier or suspected carrier of Methicillin resistant Staphylococcus aureus: Secondary | ICD-10-CM | POA: Diagnosis not present

## 2017-03-14 DIAGNOSIS — A4902 Methicillin resistant Staphylococcus aureus infection, unspecified site: Secondary | ICD-10-CM | POA: Diagnosis not present

## 2017-03-14 HISTORY — DX: Malignant neoplasm of endometrium: C54.1

## 2017-03-14 NOTE — Patient Instructions (Signed)
Preparing for your Surgery  Plan for surgery on April 17, 2017 with Dr. [name] at [facility] will be scheduled for a robotic hysterectomy, bilateral salpingo-oophorectomy, sentinel lymph node biopsy.  Pre-operative Testing -You will receive a phone call from presurgical testing at [facility] to arrange for a pre-operative testing appointment before your surgery.  This appointment normally occurs one to two weeks before your scheduled surgery.   -Bring your insurance card, copy of an advanced directive if applicable, medication list  -At that visit, you will be asked to sign a consent for a possible blood transfusion in case a transfusion becomes necessary during surgery.  The need for a blood transfusion is rare but having consent is a necessary part of your care.     -You should not be taking blood thinners or aspirin at least ten days prior to surgery unless instructed by your surgeon.  Day Before Surgery at [place] will be asked to take in a light diet the day before surgery.  Avoid carbonated beverages.  You will be advised to have nothing to eat or drink after midnight the evening before.    Eat a light diet the day before surgery.  Examples including soups, broths, toast, yogurt, mashed potatoes.  Things to avoid include carbonated beverages (fizzy beverages), raw fruits and raw vegetables, or beans.   If your bowels are filled with gas, your surgeon will have difficulty visualizing your pelvic organs which increases your surgical risks.  Your role in recovery Your role is to become active as soon as directed by your doctor, while still giving yourself time to heal.  Rest when you feel tired. You will be asked to do the following in order to speed your recovery:  - Cough and breathe deeply. This helps toclear and expand your lungs and can prevent pneumonia. You may be given a spirometer to practice deep breathing. A staff member will show  you how to use the spirometer. - Do mild physical activity. Walking or moving your legs help your circulation and body functions return to normal. A staff member will help you when you try to walk and will provide you with simple exercises. Do not try to get up or walk alone the first time. - Actively manage your pain. Managing your pain lets you move in comfort. We will ask you to rate your pain on a scale of zero to 10. It is your responsibility to tell your doctor or nurse where and how much you hurt so your pain can be treated.  Special Considerations -If you are diabetic, you may be placed on insulin after surgery to have closer control over your blood sugars to promote healing and recovery.  This does not mean that you will be discharged on insulin.  If applicable, your oral antidiabetics will be resumed when you are tolerating a solid diet.  -Your final pathology results from surgery should be available by the Friday after surgery and the results will be relayed to you when available.  -Dr. Lahoma Crocker is the Surgeon that assists your GYN Oncologist with surgery.  The next day after your surgery you will either see your GYN Oncologist or Dr. Lahoma Crocker.   Blood Transfusion Information WHAT IS A BLOOD TRANSFUSION? A transfusion is the replacement of blood or some of its parts. Blood is made up of multiple cells which provide different functions.  Red blood cells carry oxygen and are used for blood loss replacement.  White blood cells fight against infection.  Platelets control bleeding.  Plasma helps clot blood.  Other blood products are available for specialized needs, such as hemophilia or other clotting disorders. BEFORE THE TRANSFUSION  Who gives blood for transfusions?   You may be able to donate blood to be used at a later date on yourself (autologous donation).  Relatives can be asked to donate blood. This is generally not any safer than if you have received  blood from a stranger. The same precautions are taken to ensure safety when a relative's blood is donated.  Healthy volunteers who are fully evaluated to make sure their blood is safe. This is blood bank blood. Transfusion therapy is the safest it has ever been in the practice of medicine. Before blood is taken from a donor, a complete history is taken to make sure that person has no history of diseases nor engages in risky social behavior (examples are intravenous drug use or sexual activity with multiple partners). The donor's travel history is screened to minimize risk of transmitting infections, such as malaria. The donated blood is tested for signs of infectious diseases, such as HIV and hepatitis. The blood is then tested to be sure it is compatible with you in order to minimize the chance of a transfusion reaction. If you or a relative donates blood, this is often done in anticipation of surgery and is not appropriate for emergency situations. It takes many days to process the donated blood. RISKS AND COMPLICATIONS Although transfusion therapy is very safe and saves many lives, the main dangers of transfusion include:   Getting an infectious disease.  Developing a transfusion reaction. This is an allergic reaction to something in the blood you were given. Every precaution is taken to prevent this. The decision to have a blood transfusion has been considered carefully by your caregiver before blood is given. Blood is not given unless the benefits outweigh the risks.  Total Laparoscopic Hysterectomy, Care After Refer to this sheet in the next few weeks. These instructions provide you with information on caring for yourself after your procedure. Your health care provider may also give you more specific instructions. Your treatment has been planned according to current medical practices, but problems sometimes occur. Call your health care provider if you have any problems or questions after your  procedure. What can I expect after the procedure?  Pain and bruising at the incision sites. You will be given pain medicine to control it.  Menopausal symptoms such as hot flashes, night sweats, and insomnia if your ovaries were removed.  Sore throat from the breathing tube that was inserted during surgery. Follow these instructions at home:  Only take over-the-counter or prescription medicines for pain, discomfort, or fever as directed by your health care provider.  Do not take aspirin. It can cause bleeding.  Do not drive when taking pain medicine.  Follow your health care provider's advice regarding diet, exercise, lifting, driving, and general activities.  Resume your usual diet as directed and allowed.  Get plenty of rest and sleep.  Do not douche, use tampons, or have sexual intercourse for at least 6 weeks, or until your health care provider gives you permission.  Change your bandages (dressings) as directed by your health care provider.  Monitor your temperature and notify your health care provider of a fever.  Take showers instead of baths for 2-3 weeks.  Do not drink alcohol until your health care provider gives you permission.  If you develop  constipation, you may take a mild laxative with your health care provider's permission. Bran foods may help with constipation problems. Drinking enough fluids to keep your urine clear or pale yellow may help as well.  Try to have someone home with you for 1-2 weeks to help around the house.  Keep all of your follow-up appointments as directed by your health care provider. Contact a health care provider if:  You have swelling, redness, or increasing pain around your incision sites.  You have pus coming from your incision.  You notice a bad smell coming from your incision.  Your incision breaks open.  You feel dizzy or lightheaded.  You have pain or bleeding when you urinate.  You have persistent diarrhea.  You have  persistent nausea and vomiting.  You have abnormal vaginal discharge.  You have a rash.  You have any type of abnormal reaction or develop an allergy to your medicine.  You have poor pain control with your prescribed medicine. Get help right away if:  You have chest pain or shortness of breath.  You have severe abdominal pain that is not relieved with pain medicine.  You have pain or swelling in your legs. This information is not intended to replace advice given to you by your health care provider. Make sure you discuss any questions you have with your health care provider. Document Released: 03/19/2013 Document Revised: 11/04/2015 Document Reviewed: 12/17/2012 Elsevier Interactive Patient Education  2017 [employer].

## 2017-03-14 NOTE — Progress Notes (Signed)
Consult Note: Gyn-Onc  Sherry Williams 56 y.o. female  CC:  Chief Complaint  Patient presents with  . Endometrial cancer [facility])    HPI: Patient is seen today in consultation at the request of Dr. Cletis Media. Consulting physician Dr. Burney Gauze.  Patient is a 56 year old gravida 5 para 4 who has been menopausal for the past few years. She states that for the past 6-9 months she's had intermittent occasional bright red bleeding that she can sit her to be more breakthrough bleeding. She was complicated past medical history of a pulmonary embolism, Hashimoto thyroiditis and lichen sclerosus. In June of this year she began having some infectious lesions on her face. She then noticed some pustules on her vulva and a new one on her left leg. She was showering and the one on her left leg drained and she became quite frustrated. She saw Dr. Boyd Kerbs care and she wanted to be seen by her gynecologist for vulvar lesions. As soon as she reported to Dr. Cletis Media the bleeding she had an appropriate endometrial biopsy.   She was placed into a dermatologist and saw Dr. Ubaldo Glassing. She was diagnosed with MRSA of the vulva and placed on antibiotics. Since being switched to doxycycline, she states that her lesions are starting to improve.  She states that in 2017 around August before going on for her vacation she was having pain in her left calf that she without with a charley horse. She continue to massage it and exercise through through the discomfort and noticed she began having symptoms in both legs. They had a prolonged drive home and then she experienced left shoulder pain and shortness of breath. She was subsequently diagnosed with a pulmonary embolism and was started on xarelto. She was seen by Dr. Marin Olp and per her report she had negative testing for any genetic predisposition for the blood clot. She was on medication until April of this year which was discontinued. She was told to take fish oil and stay hydrated.  Interestingly her sister also had a DVT. The patient is not sure if she had testing done or not. She otherwise is usual state of health. She is up-to-date on her mammograms that she did miss one last year. She had a colonoscopy and upper endoscopy at age 56. At that time based on the finding she was told to be on a gluten-free diet  Review of Systems: Constitutional: Denies fever. Skin: + MRSA lesions face, medial thighs, vulva, LSA on vulva improved with clobetosol Cardiovascular: No chest pain, shortness of breath, or edema  Gastro Intestinal:  No nausea, vomiting, constipation, or diarrhea reported.  Genitourinary: + vaginal bleeding  Musculoskeletal: No joint pain.  Psychology: Worrying, awake at night.  Current Meds:  Outpatient Encounter Prescriptions as of 03/14/2017  Medication Sig  . cephALEXin (KEFLEX) 500 MG capsule TK ONE C PO  BID  . doxycycline (VIBRA-TABS) 100 MG tablet TK 1 T PO BID WITH A FULL MEAL  . Multiple Vitamins-Minerals (BIOTECT PLUS) CAPS Take by mouth daily.  Marland Kitchen thyroid (ARMOUR) 60 MG tablet Take 2 tabs 3 days a week  . thyroid (ARMOUR) 90 MG tablet Take 90 mg 4 days per week   No facility-administered encounter medications on file as of 03/14/2017.     Allergy:  Allergies  Allergen Reactions  . Bee Venom Anaphylaxis    Social Hx:   Social History   Social History  . Marital status: Married    Spouse name: N/A  . Number  of children: N/A  . Years of education: N/A   Occupational History  . Not on file.   Social History Main Topics  . Smoking status: Never Smoker  . Smokeless tobacco: Never Used  . Alcohol use Yes     Comment: occassional  . Drug use: No  . Sexual activity: Yes   Other Topics Concern  . Not on file   Social History Narrative  . No narrative on file    Past Surgical Hx:  Past Surgical History:  Procedure Laterality Date  . MOUTH SURGERY    . PULMONARY EMBOLISM SURGERY  2017    Past Medical Hx:  Past Medical History:   Diagnosis Date  . Allergy   . Depression   . Endometrial ca ([place]) 03/14/2017  . Hypothyroidism    Hashimoto  . Pulmonary embolism ([place]) 01/13/2016   After car trip    Oncology Hx:    Endometrial ca [facility])   03/07/2017 Initial Diagnosis    Endometrial ca [facility])       Family Hx:  Family History  Problem Relation Age of Onset  . Stroke Father   . Alzheimer's disease Father   . Hyperlipidemia Father   . Deep vein thrombosis Sister   . Breast cancer Paternal Grandmother        In her 31s, but lived to be very elderly  . Arthritis Mother   . Lung cancer Maternal Grandmother   . Diabetes Paternal Grandfather   . Pancreatic cancer Paternal Grandfather     Vitals:  Blood pressure 132/60, pulse (!) 54, temperature 97.8 F (36.6 C), temperature source Oral, resp. rate 18, height 5' 5.75" (1.67 m), weight 142 lb 11.2 oz (64.7 kg), last menstrual period 09/04/2012, SpO2 100 %.  Physical Exam:  Well-nourished well-developed female in no acute distress.  Neck: Supple, no lymphadenopathy, no thyromegaly.  Abdomen: Soft, nontender, nondistended. There are no palpable masses or prostatomegaly.  Groins: No lymphadenopathy.  Extremities: No edema.  Pelvic: External genitalia is slightly atrophic with some loss of the normal architecture but not significant changes. She has Band-Aids primarily on the right gluteal region with small sores. The majority of the sores are healing well, crusted over with no active purulence or erythema. Vagina slightly atrophic. The cervix is multiparous. There's a physiologic discharge. There are no visible lesions. Bimanual examination the uterus is of normal size shape and consistency. There are no adnexal masses. Rectal confirms.  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 I met with the patient and her husband today for greater than 80 minutes.  1) Endometrial cancer:We discussed the role of surgery  including minimally invasive surgery versus abdominal hysterectomy. We discussed that minimally invasive surgery would allow faster recovery, less pain and she is willing to proceed with that. I did discuss with her that we use a robotic platform. Her questions regarding minimally invasive surgery were elicited in answer to her satisfaction. We also discussed the role of sentinel lymph node removal versus complete lymphadenectomy. She understands that should she not map to one or both sides will need to proceed with a complete lymphadenectomy on both sides pending the results of the frozen section. She had questions regarding thing she had seen online that she would have prolapse symptoms etc. I answered her questions regarding this. They wanted to know questions regarding next steps in treatment and outcome. Discuss with them that the majority of clinical stage I grade 1 endometrial cancers are cured  with surgery alone however I be able to answer her exact questions was without final pathology. She is tentatively scheduled for surgery with me on November 6.  2) Pulmonary embolism: We discussed the most usual risks and benefits of surgery including but not limited to bleeding, infection, injury to surrounding organs and normal bowel disease. She is a personal history of a pulmonary edema in August of last year. She was treated with 6 months of xarelto and per the patient's report had negative hypercoagulability workup. I have sent a message to Dr.Ennever to see if he would recommend any additional DVT prophylaxis her treatment. What I suggested to the patient that she receive a dose of Lovenox preoperatively. She will have compression devices on during the surgery and I would continue prophylactic dose Lovenox for 2 weeks after surgery. Should his recommendations be different I will counsel the patient regarding this.  3) MRSA: She is positive for MRSA and is currently on doxycycline. We discussed that would be  to proceed with her surgery despite this infection. However for her to come off precautions, she would need to have 3 negative swabs. She will follow-up with her dermatologist. She knows that we will perform one of her swabs and she comes in for her preoperative visit.  I appreciate the opportunity to participate in the care of this very pleasant patient. Irl Bodie A., MD 03/14/2017, 1:06 PM

## 2017-03-21 DIAGNOSIS — L700 Acne vulgaris: Secondary | ICD-10-CM | POA: Diagnosis not present

## 2017-03-21 DIAGNOSIS — A4902 Methicillin resistant Staphylococcus aureus infection, unspecified site: Secondary | ICD-10-CM | POA: Diagnosis not present

## 2017-03-23 DIAGNOSIS — L0889 Other specified local infections of the skin and subcutaneous tissue: Secondary | ICD-10-CM | POA: Diagnosis not present

## 2017-03-26 ENCOUNTER — Telehealth: Payer: Self-pay | Admitting: *Deleted

## 2017-03-26 NOTE — Telephone Encounter (Signed)
Contacted the patient and informed her "you don't have to have anymore nasal swabs for the MRSA. We will check that test with your pre-op testing." Patient had several questions for Dr.Gehrig, explained to the patient that the information will be given to Dr. Alycia Rossetti and I will get back in contact with her.

## 2017-03-29 ENCOUNTER — Telehealth: Payer: Self-pay | Admitting: Gynecologic Oncology

## 2017-03-29 NOTE — Telephone Encounter (Signed)
Returned call to patient.  She had called and asked about alternative therapies for treatment of her endo ca.  She was asking about using diet or supplements.  She wanted to do alternate things before surgery and was asking about checking a marker every three months.   Advised her to please call the office to discuss.

## 2017-04-11 ENCOUNTER — Other Ambulatory Visit (HOSPITAL_COMMUNITY): Payer: Self-pay | Admitting: Emergency Medicine

## 2017-04-11 NOTE — Progress Notes (Signed)
CT angio chest 04-20-16 epic   US venous lower bilateral 04-13-16 epic

## 2017-04-11 NOTE — Patient Instructions (Addendum)
Sherry Williams  04/11/2017   Your procedure is scheduled on: 04-17-17  Report to [facility] Main  Entrance Take [place]  elevators to 3rd floor to  [place] at 530AM.   Call this number if you have problems the morning of surgery [phone]    Remember: ONLY 1 PERSON MAY GO WITH YOU TO SHORT STAY TO GET  READY MORNING OF [place].     Eat a light diet the day before surgery.  Examples including soups, broths, toast, yogurt, mashed potatoes.  Things to avoid include carbonated beverages (fizzy beverages), raw fruits and raw vegetables, or beans.  If your bowels are filled with gas, your surgeon will have difficulty visualizing your pelvic organs which increases your surgical risks.  Do not eat food or drink liquids :After Midnight!!     Take these medicines the morning of surgery with A SIP OF WATER: thyroid Armour                                You may not have any metal on your body including hair pins and              piercings  Do not wear jewelry, make-up, lotions, powders or perfumes, deodorant             Do not wear nail polish.  Do not shave  48 hours prior to surgery.           Do not bring valuables to the hospital. [place].  Contacts, dentures or bridgework may not be worn into surgery.  Leave suitcase in the car. After surgery it may be brought to your room.               Please read over the following fact sheets you were given: _____________________________________________________________________   [facility] - Preparing for Surgery Before surgery, you can play an important role.  Because skin is not sterile, your skin needs to be as free of germs as possible.  You can reduce the number of germs on your skin by washing with CHG (chlorahexidine gluconate) soap before surgery.  CHG is an antiseptic cleaner which kills germs and bonds with the skin to continue killing germs even  after washing. Please DO NOT use if you have an allergy to CHG or antibacterial soaps.  If your skin becomes reddened/irritated stop using the CHG and inform your nurse when you arrive at Short Stay. Do not shave (including legs and underarms) for at least 48 hours prior to the first CHG shower.  You may shave your face/neck. Please follow these instructions carefully:  1.  Shower with CHG Soap the night before surgery and the  morning of Surgery.  2.  If you choose to wash your hair, wash your hair first as usual with your  normal  shampoo.  3.  After you shampoo, rinse your hair and body thoroughly to remove the  shampoo.                           4.  Use CHG as you would any other liquid soap.  You can apply chg directly  to the skin and  wash                       Gently with a scrungie or clean washcloth.  5.  Apply the CHG Soap to your body ONLY FROM THE NECK DOWN.   Do not use on face/ open                           Wound or open sores. Avoid contact with eyes, ears mouth and genitals (private parts).                       Wash face,  Genitals (private parts) with your normal soap.             6.  Wash thoroughly, paying special attention to the area where your surgery  will be performed.  7.  Thoroughly rinse your body with warm water from the neck down.  8.  DO NOT shower/wash with your normal soap after using and rinsing off  the CHG Soap.                9.  Pat yourself dry with a clean towel.            10.  Wear clean pajamas.            11.  Place clean sheets on your bed the night of your first shower and do not  sleep with pets. Day of Surgery : Do not apply any lotions/deodorants the morning of surgery.  Please wear clean clothes to the hospital/surgery center.  FAILURE TO FOLLOW THESE INSTRUCTIONS MAY RESULT IN THE CANCELLATION OF YOUR SURGERY PATIENT SIGNATURE_________________________________  NURSE  SIGNATURE__________________________________  ________________________________________________________________________   Adam Phenix  An incentive spirometer is a tool that can help keep your lungs clear and active. This tool measures how well you are filling your lungs with each breath. Taking long deep breaths may help reverse or decrease the chance of developing breathing (pulmonary) problems (especially infection) following:  A long period of time when you are unable to move or be active. BEFORE THE PROCEDURE   If the spirometer includes an indicator to show your best effort, your nurse or respiratory therapist will set it to a desired goal.  If possible, sit up straight or lean slightly forward. Try not to slouch.  Hold the incentive spirometer in an upright position. INSTRUCTIONS FOR USE  1. Sit on the edge of your bed if possible, or sit up as far as you can in bed or on a chair. 2. Hold the incentive spirometer in an upright position. 3. Breathe out normally. 4. Place the mouthpiece in your mouth and seal your lips tightly around it. 5. Breathe in slowly and as deeply as possible, raising the piston or the ball toward the top of the column. 6. Hold your breath for 3-5 seconds or for as long as possible. Allow the piston or ball to fall to the bottom of the column. 7. Remove the mouthpiece from your mouth and breathe out normally. 8. Rest for a few seconds and repeat Steps 1 through 7 at least 10 times every 1-2 hours when you are awake. Take your time and take a few normal breaths between deep breaths. 9. The spirometer may include an indicator to show your best effort. Use the indicator as a goal to work toward during each repetition. 10. After each set of 10 deep  breaths, practice coughing to be sure your lungs are clear. If you have an incision (the cut made at the time of surgery), support your incision when coughing by placing a pillow or rolled up towels firmly  against it. Once you are able to get out of bed, walk around indoors and cough well. You may stop using the incentive spirometer when instructed by your caregiver.  RISKS AND COMPLICATIONS  Take your time so you do not get dizzy or light-headed.  If you are in pain, you may need to take or ask for pain medication before doing incentive spirometry. It is harder to take a deep breath if you are having pain. AFTER USE  Rest and breathe slowly and easily.  It can be helpful to keep track of a log of your progress. Your caregiver can provide you with a simple table to help with this. If you are using the spirometer at home, follow these instructions: [place] IF:   You are having difficultly using the spirometer.  You have trouble using the spirometer as often as instructed.  Your pain medication is not giving enough relief while using the spirometer.  You develop fever of 100.5 F (38.1 C) or higher. SEEK IMMEDIATE MEDICAL CARE IF:   You cough up bloody sputum that had not been present before.  You develop fever of 102 F (38.9 C) or greater.  You develop worsening pain at or near the incision site. MAKE SURE YOU:   Understand these instructions.  Will watch your condition.  Will get help right away if you are not doing well or get worse. Document Released: 10/09/2006 Document Revised: 08/21/2011 Document Reviewed: 12/10/2006 ExitCare Patient Information 2014 ExitCare, [place].   ________________________________________________________________________  WHAT IS A BLOOD TRANSFUSION? Blood Transfusion Information  A transfusion is the replacement of blood or some of its parts. Blood is made up of multiple cells which provide different functions.  Red blood cells carry oxygen and are used for blood loss replacement.  White blood cells fight against infection.  Platelets control bleeding.  Plasma helps clot blood.  Other blood products are available for  specialized needs, such as hemophilia or other clotting disorders. BEFORE THE TRANSFUSION  Who gives blood for transfusions?   Healthy volunteers who are fully evaluated to make sure their blood is safe. This is blood bank blood. Transfusion therapy is the safest it has ever been in the practice of medicine. Before blood is taken from a donor, a complete history is taken to make sure that person has no history of diseases nor engages in risky social behavior (examples are intravenous drug use or sexual activity with multiple partners). The donor's travel history is screened to minimize risk of transmitting infections, such as malaria. The donated blood is tested for signs of infectious diseases, such as HIV and hepatitis. The blood is then tested to be sure it is compatible with you in order to minimize the chance of a transfusion reaction. If you or a relative donates blood, this is often done in anticipation of surgery and is not appropriate for emergency situations. It takes many days to process the donated blood. RISKS AND COMPLICATIONS Although transfusion therapy is very safe and saves many lives, the main dangers of transfusion include:   Getting an infectious disease.  Developing a transfusion reaction. This is an allergic reaction to something in the blood you were given. Every precaution is taken to prevent this. The decision to have a blood transfusion has  been considered carefully by your caregiver before blood is given. Blood is not given unless the benefits outweigh the risks. AFTER THE TRANSFUSION  Right after receiving a blood transfusion, you will usually feel much better and more energetic. This is especially true if your red blood cells have gotten low (anemic). The transfusion raises the level of the red blood cells which carry oxygen, and this usually causes an energy increase.  The nurse administering the transfusion will monitor you carefully for complications. HOME CARE  INSTRUCTIONS  No special instructions are needed after a transfusion. You may find your energy is better. Speak with your caregiver about any limitations on activity for underlying diseases you may have. SEEK MEDICAL CARE IF:   Your condition is not improving after your transfusion.  You develop redness or irritation at the intravenous (IV) site. SEEK IMMEDIATE MEDICAL CARE IF:  Any of the following symptoms occur over the next 12 hours:  Shaking chills.  You have a temperature by mouth above 102 F (38.9 C), not controlled by medicine.  Chest, back, or muscle pain.  People around you feel you are not acting correctly or are confused.  Shortness of breath or difficulty breathing.  Dizziness and fainting.  You get a rash or develop hives.  You have a decrease in urine output.  Your urine turns a dark color or changes to pink, red, or brown. Any of the following symptoms occur over the next 10 days:  You have a temperature by mouth above 102 F (38.9 C), not controlled by medicine.  Shortness of breath.  Weakness after normal activity.  The white part of the eye turns yellow (jaundice).  You have a decrease in the amount of urine or are urinating less often.  Your urine turns a dark color or changes to pink, red, or brown. Document Released: 05/26/2000 Document Revised: 08/21/2011 Document Reviewed: 01/13/2008 [facility] Patient Information 2014 [place], [place].  _______________________________________________________________________

## 2017-04-12 ENCOUNTER — Encounter (HOSPITAL_COMMUNITY): Payer: Self-pay

## 2017-04-12 ENCOUNTER — Encounter (HOSPITAL_COMMUNITY)
Admission: RE | Admit: 2017-04-12 | Discharge: 2017-04-12 | Disposition: A | Discharge status: Home / Self Care | Payer: 59 | Source: Ambulatory Visit | Attending: Obstetrics & Gynecology | Admitting: Obstetrics & Gynecology

## 2017-04-12 DIAGNOSIS — Z01818 Encounter for other preprocedural examination: Secondary | ICD-10-CM | POA: Insufficient documentation

## 2017-04-12 DIAGNOSIS — C541 Malignant neoplasm of endometrium: Secondary | ICD-10-CM | POA: Insufficient documentation

## 2017-04-12 HISTORY — DX: Family history of other specified conditions: Z84.89

## 2017-04-12 HISTORY — DX: Carrier or suspected carrier of methicillin resistant Staphylococcus aureus: Z22.322

## 2017-04-12 HISTORY — DX: Lichen sclerosus et atrophicus: L90.0

## 2017-04-12 HISTORY — DX: Acute embolism and thrombosis of unspecified deep veins of unspecified lower extremity: I82.409

## 2017-04-12 LAB — SURGICAL PCR SCREEN
MRSA, PCR: NEGATIVE
Staphylococcus aureus: NEGATIVE

## 2017-04-12 LAB — CBC
HCT: 38.6 % (ref 36.0–46.0)
Hemoglobin: 12.8 g/dL (ref 12.0–15.0)
MCH: 29.4 pg (ref 26.0–34.0)
MCHC: 33.2 g/dL (ref 30.0–36.0)
MCV: 88.5 fL (ref 78.0–100.0)
Platelets: 232 10*3/uL (ref 150–400)
RBC: 4.36 MIL/uL (ref 3.87–5.11)
RDW: 12.8 % (ref 11.5–15.5)
WBC: 3.6 10*3/uL — ABNORMAL LOW (ref 4.0–10.5)

## 2017-04-12 LAB — URINALYSIS, ROUTINE W REFLEX MICROSCOPIC
Bacteria, UA: NONE SEEN
Bilirubin Urine: NEGATIVE
Glucose, UA: NEGATIVE mg/dL
Ketones, ur: NEGATIVE mg/dL
Leukocytes, UA: NEGATIVE
Nitrite: NEGATIVE
Protein, ur: NEGATIVE mg/dL
Specific Gravity, Urine: 1.004 — ABNORMAL LOW (ref 1.005–1.030)
Squamous Epithelial / LPF: NONE SEEN
pH: 7 (ref 5.0–8.0)

## 2017-04-12 LAB — COMPREHENSIVE METABOLIC PANEL
ALT: 16 U/L (ref 14–54)
AST: 19 U/L (ref 15–41)
Albumin: 4.6 g/dL (ref 3.5–5.0)
Alkaline Phosphatase: 67 U/L (ref 38–126)
Anion gap: 9 (ref 5–15)
BUN: 16 mg/dL (ref 6–20)
CO2: 28 mmol/L (ref 22–32)
Calcium: 9.3 mg/dL (ref 8.9–10.3)
Chloride: 105 mmol/L (ref 101–111)
Creatinine, Ser: 0.79 mg/dL (ref 0.44–1.00)
GFR calc Af Amer: >60 mL/min (ref >60)
GFR calc non Af Amer: >60 mL/min (ref >60)
Glucose, Bld: 89 mg/dL (ref 65–99)
Potassium: 4.9 mmol/L (ref 3.5–5.1)
Sodium: 142 mmol/L (ref 135–145)
Total Bilirubin: 0.4 mg/dL (ref 0.3–1.2)
Total Protein: 7 g/dL (ref 6.5–8.1)

## 2017-04-12 LAB — ABO/RH: ABO/RH(D): O POS

## 2017-04-16 ENCOUNTER — Telehealth: Payer: Self-pay | Admitting: Gynecologic Oncology

## 2017-04-16 NOTE — Telephone Encounter (Signed)
Returned call to patient.  She had several concerns about lymphadenectomy and sentinel lymph node biopsy.  All questions answered.  She states if the lymph node removal was preventative, she would rather not have it.  Advised her it is part of the staging for surgery to evaluate for spread of the cancer.  Will inform Dr. Alycia Rossetti of her concerns as well.  She states she signed to proceed with the lymph node removal but wanted Dr. Alycia Rossetti to be aware of her concerns.  Advised to call for any needs or concerns.

## 2017-04-16 NOTE — Anesthesia Preprocedure Evaluation (Signed)
Anesthesia Evaluation  Patient identified by MRN, date of birth, ID band Patient awake    Reviewed: Allergy & Precautions, NPO status , Patient's Chart, lab work & pertinent test results  Airway Mallampati: II  TM Distance: >3 FB Neck ROM: Full    Dental no notable dental hx.    Pulmonary neg pulmonary ROS,    Pulmonary exam normal breath sounds clear to auscultation       Cardiovascular negative cardio ROS Normal cardiovascular exam Rhythm:Regular Rate:Normal     Neuro/Psych PSYCHIATRIC DISORDERS Depression negative neurological ROS     GI/Hepatic negative GI ROS, Neg liver ROS,   Endo/Other  Hypothyroidism   Renal/GU negative Renal ROS     Musculoskeletal negative musculoskeletal ROS (+)   Abdominal   Peds  Hematology negative hematology ROS (+)   Anesthesia Other Findings   Reproductive/Obstetrics negative OB ROS                             Anesthesia Physical Anesthesia Plan  ASA: II  Anesthesia Plan: General   Post-op Pain Management:    Induction: Intravenous  PONV Risk Score and Plan: 3 and Ondansetron, Dexamethasone, Midazolam, Scopolamine patch - Pre-op and Treatment may vary due to age or medical condition  Airway Management Planned: Oral ETT  Additional Equipment:   Intra-op Plan:   Post-operative Plan: Extubation in OR  Informed Consent: I have reviewed the patients History and Physical, chart, labs and discussed the procedure including the risks, benefits and alternatives for the proposed anesthesia with the patient or authorized representative who has indicated his/her understanding and acceptance.   Dental advisory given  Plan Discussed with: CRNA  Anesthesia Plan Comments:         Anesthesia Quick Evaluation

## 2017-04-17 ENCOUNTER — Ambulatory Visit (HOSPITAL_COMMUNITY)
Admission: RE | Admit: 2017-04-17 | Discharge: 2017-04-18 | Disposition: A | Discharge status: Home / Self Care | Payer: 59 | Source: Ambulatory Visit | Attending: Gynecologic Oncology | Admitting: Gynecologic Oncology

## 2017-04-17 ENCOUNTER — Encounter (HOSPITAL_COMMUNITY)
Admission: RE | Disposition: A | Discharge status: Home / Self Care | Payer: Self-pay | Source: Ambulatory Visit | Attending: Gynecologic Oncology

## 2017-04-17 ENCOUNTER — Ambulatory Visit (HOSPITAL_COMMUNITY): Payer: 59 | Admitting: Anesthesiology

## 2017-04-17 ENCOUNTER — Encounter (HOSPITAL_COMMUNITY): Payer: Self-pay | Admitting: *Deleted

## 2017-04-17 ENCOUNTER — Other Ambulatory Visit: Payer: Self-pay

## 2017-04-17 DIAGNOSIS — N83202 Unspecified ovarian cyst, left side: Secondary | ICD-10-CM | POA: Insufficient documentation

## 2017-04-17 DIAGNOSIS — E063 Autoimmune thyroiditis: Secondary | ICD-10-CM | POA: Diagnosis not present

## 2017-04-17 DIAGNOSIS — Z86711 Personal history of pulmonary embolism: Secondary | ICD-10-CM | POA: Diagnosis not present

## 2017-04-17 DIAGNOSIS — J811 Chronic pulmonary edema: Secondary | ICD-10-CM | POA: Insufficient documentation

## 2017-04-17 DIAGNOSIS — C542 Malignant neoplasm of myometrium: Secondary | ICD-10-CM | POA: Diagnosis not present

## 2017-04-17 DIAGNOSIS — A4902 Methicillin resistant Staphylococcus aureus infection, unspecified site: Secondary | ICD-10-CM | POA: Diagnosis not present

## 2017-04-17 DIAGNOSIS — Z7901 Long term (current) use of anticoagulants: Secondary | ICD-10-CM | POA: Diagnosis not present

## 2017-04-17 DIAGNOSIS — Z79899 Other long term (current) drug therapy: Secondary | ICD-10-CM | POA: Diagnosis not present

## 2017-04-17 DIAGNOSIS — C541 Malignant neoplasm of endometrium: Secondary | ICD-10-CM | POA: Diagnosis not present

## 2017-04-17 DIAGNOSIS — I2699 Other pulmonary embolism without acute cor pulmonale: Secondary | ICD-10-CM | POA: Diagnosis not present

## 2017-04-17 DIAGNOSIS — F329 Major depressive disorder, single episode, unspecified: Secondary | ICD-10-CM | POA: Diagnosis not present

## 2017-04-17 DIAGNOSIS — N83201 Unspecified ovarian cyst, right side: Secondary | ICD-10-CM | POA: Diagnosis not present

## 2017-04-17 HISTORY — PX: ROBOTIC ASSISTED TOTAL HYSTERECTOMY WITH BILATERAL SALPINGO OOPHERECTOMY: SHX6086

## 2017-04-17 LAB — TYPE AND SCREEN
ABO/RH(D): O POS
Antibody Screen: NEGATIVE

## 2017-04-17 SURGERY — HYSTERECTOMY, TOTAL, ROBOT-ASSISTED, LAPAROSCOPIC, WITH BILATERAL SALPINGO-OOPHORECTOMY
Anesthesia: General | Laterality: Bilateral

## 2017-04-17 MED ORDER — ROCURONIUM BROMIDE 50 MG/5ML IV SOSY
PREFILLED_SYRINGE | INTRAVENOUS | Status: AC
  Filled 2017-04-17: qty 5

## 2017-04-17 MED ORDER — CEFAZOLIN SODIUM-DEXTROSE 2-4 GM/100ML-% IV SOLN
INTRAVENOUS | Status: AC
  Filled 2017-04-17: qty 100

## 2017-04-17 MED ORDER — IBUPROFEN 800 MG PO TABS: 800.0000 mg | ORAL_TABLET | Freq: Three times a day (TID) | ORAL | Status: DC | PRN

## 2017-04-17 MED ORDER — SENNOSIDES-DOCUSATE SODIUM 8.6-50 MG PO TABS
2.0000 | ORAL_TABLET | Freq: Every day | ORAL | Status: DC
  Filled 2017-04-17: qty 2

## 2017-04-17 MED ORDER — SUCCINYLCHOLINE CHLORIDE 200 MG/10ML IV SOSY
PREFILLED_SYRINGE | INTRAVENOUS | Status: AC
  Filled 2017-04-17: qty 10

## 2017-04-17 MED ORDER — ONDANSETRON HCL 4 MG/2ML IJ SOLN
INTRAMUSCULAR | Status: AC
  Filled 2017-04-17: qty 2

## 2017-04-17 MED ORDER — LIDOCAINE 2% (20 MG/ML) 5 ML SYRINGE
INTRAMUSCULAR | Status: AC
  Filled 2017-04-17: qty 5

## 2017-04-17 MED ORDER — SCOPOLAMINE 1 MG/3DAYS TD PT72
1.0000 | MEDICATED_PATCH | TRANSDERMAL | Status: DC
  Administered 2017-04-17: 1 via TRANSDERMAL

## 2017-04-17 MED ORDER — ONDANSETRON HCL 4 MG PO TABS: 4.0000 mg | ORAL_TABLET | Freq: Four times a day (QID) | ORAL | Status: DC | PRN

## 2017-04-17 MED ORDER — PROPOFOL 10 MG/ML IV BOLUS
INTRAVENOUS | Status: AC
  Filled 2017-04-17: qty 20

## 2017-04-17 MED ORDER — STERILE WATER FOR IRRIGATION IR SOLN
Status: DC | PRN
  Administered 2017-04-17: 1000 mL

## 2017-04-17 MED ORDER — OXYCODONE-ACETAMINOPHEN 5-325 MG PO TABS: 1.0000 | ORAL_TABLET | ORAL | Status: DC | PRN

## 2017-04-17 MED ORDER — SCOPOLAMINE 1 MG/3DAYS TD PT72
MEDICATED_PATCH | TRANSDERMAL | Status: AC
  Filled 2017-04-17: qty 1

## 2017-04-17 MED ORDER — FENTANYL CITRATE (PF) 250 MCG/5ML IJ SOLN
INTRAMUSCULAR | Status: AC
  Filled 2017-04-17: qty 5

## 2017-04-17 MED ORDER — ROCURONIUM BROMIDE 50 MG/5ML IV SOSY
PREFILLED_SYRINGE | INTRAVENOUS | Status: DC | PRN
  Administered 2017-04-17: 10 mg via INTRAVENOUS
  Administered 2017-04-17: 50 mg via INTRAVENOUS
  Administered 2017-04-17: 10 mg via INTRAVENOUS

## 2017-04-17 MED ORDER — LIDOCAINE 2% (20 MG/ML) 5 ML SYRINGE
INTRAMUSCULAR | Status: DC | PRN
  Administered 2017-04-17: 60 mg via INTRAVENOUS

## 2017-04-17 MED ORDER — GABAPENTIN 300 MG PO CAPS
600.0000 mg | ORAL_CAPSULE | Freq: Every day | ORAL | Status: DC
  Filled 2017-04-17: qty 2

## 2017-04-17 MED ORDER — CEFAZOLIN SODIUM-DEXTROSE 2-4 GM/100ML-% IV SOLN
2.0000 g | INTRAVENOUS | Status: AC
  Administered 2017-04-17: 2 g via INTRAVENOUS

## 2017-04-17 MED ORDER — ONDANSETRON HCL 4 MG/2ML IJ SOLN: 4.0000 mg | Freq: Four times a day (QID) | INTRAMUSCULAR | Status: DC | PRN

## 2017-04-17 MED ORDER — THYROID 60 MG PO TABS: 90.0000 mg | ORAL_TABLET | ORAL | Status: DC

## 2017-04-17 MED ORDER — LACTATED RINGERS IV SOLN
INTRAVENOUS | Status: DC | PRN
  Administered 2017-04-17: 07:00:00 via INTRAVENOUS

## 2017-04-17 MED ORDER — DEXAMETHASONE SODIUM PHOSPHATE 10 MG/ML IJ SOLN
INTRAMUSCULAR | Status: DC | PRN
  Administered 2017-04-17: 10 mg via INTRAVENOUS

## 2017-04-17 MED ORDER — STERILE WATER FOR INJECTION IJ SOLN
INTRAMUSCULAR | Status: AC
  Filled 2017-04-17: qty 10

## 2017-04-17 MED ORDER — MEPERIDINE HCL 50 MG/ML IJ SOLN: 6.2500 mg | INTRAMUSCULAR | Status: DC | PRN

## 2017-04-17 MED ORDER — FENTANYL CITRATE (PF) 100 MCG/2ML IJ SOLN
25.0000 ug | INTRAMUSCULAR | Status: DC | PRN
  Administered 2017-04-17 (×2): 50 ug via INTRAVENOUS

## 2017-04-17 MED ORDER — LACTATED RINGERS IR SOLN
Status: DC | PRN
  Administered 2017-04-17: 1000 mL

## 2017-04-17 MED ORDER — FENTANYL CITRATE (PF) 250 MCG/5ML IJ SOLN
INTRAMUSCULAR | Status: DC | PRN
  Administered 2017-04-17 (×2): 50 ug via INTRAVENOUS

## 2017-04-17 MED ORDER — DEXAMETHASONE SODIUM PHOSPHATE 10 MG/ML IJ SOLN
INTRAMUSCULAR | Status: AC
  Filled 2017-04-17: qty 1

## 2017-04-17 MED ORDER — PROMETHAZINE HCL 25 MG/ML IJ SOLN: 6.2500 mg | INTRAMUSCULAR | Status: DC | PRN

## 2017-04-17 MED ORDER — KCL IN DEXTROSE-NACL 20-5-0.45 MEQ/L-%-% IV SOLN
INTRAVENOUS | Status: DC
  Filled 2017-04-17: qty 1000

## 2017-04-17 MED ORDER — THYROID 60 MG PO TABS
60.0000 mg | ORAL_TABLET | ORAL | Status: DC
  Administered 2017-04-18: 60 mg via ORAL
  Filled 2017-04-17: qty 1

## 2017-04-17 MED ORDER — STERILE WATER FOR INJECTION IJ SOLN
INTRAMUSCULAR | Status: DC | PRN
  Administered 2017-04-17: 2 mL

## 2017-04-17 MED ORDER — HYDROMORPHONE HCL 1 MG/ML IJ SOLN: 0.2000 mg | INTRAMUSCULAR | Status: DC | PRN

## 2017-04-17 MED ORDER — ONDANSETRON HCL 4 MG/2ML IJ SOLN
INTRAMUSCULAR | Status: DC | PRN
  Administered 2017-04-17: 4 mg via INTRAVENOUS

## 2017-04-17 MED ORDER — ENOXAPARIN SODIUM 40 MG/0.4ML ~~LOC~~ SOLN
40.0000 mg | SUBCUTANEOUS | Status: AC
  Administered 2017-04-17: 40 mg via SUBCUTANEOUS
  Filled 2017-04-17: qty 0.4

## 2017-04-17 MED ORDER — MIDAZOLAM HCL 2 MG/2ML IJ SOLN
INTRAMUSCULAR | Status: DC | PRN
  Administered 2017-04-17 (×2): 1 mg via INTRAVENOUS

## 2017-04-17 MED ORDER — ENOXAPARIN SODIUM 40 MG/0.4ML ~~LOC~~ SOLN
40.0000 mg | SUBCUTANEOUS | Status: DC
  Administered 2017-04-18: 40 mg via SUBCUTANEOUS
  Filled 2017-04-17: qty 0.4

## 2017-04-17 MED ORDER — SUGAMMADEX SODIUM 200 MG/2ML IV SOLN
INTRAVENOUS | Status: AC
  Filled 2017-04-17: qty 2

## 2017-04-17 MED ORDER — FENTANYL CITRATE (PF) 100 MCG/2ML IJ SOLN
INTRAMUSCULAR | Status: AC
  Filled 2017-04-17: qty 2

## 2017-04-17 MED ORDER — KETOROLAC TROMETHAMINE 15 MG/ML IJ SOLN
INTRAMUSCULAR | Status: AC
Start: 2017-04-17 — End: 2017-04-17
  Filled 2017-04-17: qty 1

## 2017-04-17 MED ORDER — KETOROLAC TROMETHAMINE 15 MG/ML IJ SOLN
15.0000 mg | Freq: Four times a day (QID) | INTRAMUSCULAR | Status: AC
  Administered 2017-04-17 – 2017-04-18 (×4): 15 mg via INTRAVENOUS
  Filled 2017-04-17 (×3): qty 1

## 2017-04-17 MED ORDER — MIDAZOLAM HCL 2 MG/2ML IJ SOLN
INTRAMUSCULAR | Status: AC
  Filled 2017-04-17: qty 2

## 2017-04-17 MED ORDER — PROPOFOL 10 MG/ML IV BOLUS
INTRAVENOUS | Status: DC | PRN
  Administered 2017-04-17: 140 mg via INTRAVENOUS

## 2017-04-17 MED ORDER — SUGAMMADEX SODIUM 200 MG/2ML IV SOLN
INTRAVENOUS | Status: DC | PRN
  Administered 2017-04-17: 150 mg via INTRAVENOUS

## 2017-04-17 SURGICAL SUPPLY — 52 items
ADH SKN CLS APL DERMABOND .7 (GAUZE/BANDAGES/DRESSINGS) ×1
APL SKNCLS STERI-STRIP NONHPOA (GAUZE/BANDAGES/DRESSINGS)
BAG SPEC RTRVL LRG 6X4 10 (ENDOMECHANICALS) ×1
BENZOIN TINCTURE PRP APPL 2/3 (GAUZE/BANDAGES/DRESSINGS) ×1 IMPLANT
CLOSURE WOUND 1/2 X4 (GAUZE/BANDAGES/DRESSINGS)
COVER BACK TABLE 60X90IN (DRAPES) ×2 IMPLANT
COVER TIP SHEARS 8 DVNC (MISCELLANEOUS) ×1 IMPLANT
COVER TIP SHEARS 8MM DA VINCI (MISCELLANEOUS) ×2
DERMABOND ADVANCED (GAUZE/BANDAGES/DRESSINGS) ×2
DERMABOND ADVANCED .7 DNX12 (GAUZE/BANDAGES/DRESSINGS) IMPLANT
DRAPE ARM DVNC X/XI (DISPOSABLE) ×4 IMPLANT
DRAPE COLUMN DVNC XI (DISPOSABLE) ×1 IMPLANT
DRAPE DA VINCI XI ARM (DISPOSABLE) ×8
DRAPE DA VINCI XI COLUMN (DISPOSABLE) ×2
DRAPE SHEET LG 3/4 BI-LAMINATE (DRAPES) ×3 IMPLANT
DRAPE SURG IRRIG POUCH 19X23 (DRAPES) ×3 IMPLANT
DRSG TEGADERM 2-3/8X2-3/4 SM (GAUZE/BANDAGES/DRESSINGS) IMPLANT
ELECT REM PT RETURN 15FT ADLT (MISCELLANEOUS) ×3 IMPLANT
GAUZE SPONGE 2X2 8PLY STRL LF (GAUZE/BANDAGES/DRESSINGS) IMPLANT
GLOVE BIO SURGEON STRL SZ 6.5 (GLOVE) ×10 IMPLANT
GLOVE BIO SURGEONS STRL SZ 6.5 (GLOVE) ×5
GLOVE BIOGEL PI IND STRL 7.0 (GLOVE) ×2 IMPLANT
GLOVE BIOGEL PI INDICATOR 7.0 (GLOVE) ×4
GOWN STRL REUS W/ TWL LRG LVL3 (GOWN DISPOSABLE) ×3 IMPLANT
GOWN STRL REUS W/TWL LRG LVL3 (GOWN DISPOSABLE) ×9
HOLDER FOLEY CATH W/STRAP (MISCELLANEOUS) ×3 IMPLANT
IRRIG SUCT STRYKERFLOW 2 WTIP (MISCELLANEOUS) ×3
IRRIGATION SUCT STRKRFLW 2 WTP (MISCELLANEOUS) ×1 IMPLANT
KIT PROCEDURE DA VINCI SI (MISCELLANEOUS) ×2
KIT PROCEDURE DVNC SI (MISCELLANEOUS) IMPLANT
MANIPULATOR UTERINE 4.5 ZUMI (MISCELLANEOUS) ×3 IMPLANT
NDL SAFETY ECLIPSE 18X1.5 (NEEDLE) IMPLANT
NDL SPNL 18GX3.5 QUINCKE PK (NEEDLE) IMPLANT
NEEDLE HYPO 18GX1.5 SHARP (NEEDLE) ×3
NEEDLE SPNL 18GX3.5 QUINCKE PK (NEEDLE) ×3 IMPLANT
PACK ROBOT GYN CUSTOM WL (TRAY / TRAY PROCEDURE) ×3 IMPLANT
PAD POSITIONING PINK XL (MISCELLANEOUS) ×3 IMPLANT
POUCH SPECIMEN RETRIEVAL 10MM (ENDOMECHANICALS) ×2 IMPLANT
SEAL CANN UNIV 5-8 DVNC XI (MISCELLANEOUS) ×4 IMPLANT
SEAL XI 5MM-8MM UNIVERSAL (MISCELLANEOUS) ×8
SET TRI-LUMEN FLTR TB AIRSEAL (TUBING) ×2 IMPLANT
SOLUTION ELECTROLUBE (MISCELLANEOUS) ×1 IMPLANT
SPONGE GAUZE 2X2 STER 10/PKG (GAUZE/BANDAGES/DRESSINGS)
STRIP CLOSURE SKIN 1/2X4 (GAUZE/BANDAGES/DRESSINGS) ×1 IMPLANT
SUT VIC AB 0 CT1 27 (SUTURE)
SUT VIC AB 0 CT1 27XBRD ANTBC (SUTURE) IMPLANT
SYR 10ML LL (SYRINGE) ×2 IMPLANT
TOWEL OR NON WOVEN STRL DISP B (DISPOSABLE) ×3 IMPLANT
TRAP SPECIMEN MUCOUS 40CC (MISCELLANEOUS) ×1 IMPLANT
TROCAR BLADELESS OPT 5 100 (ENDOMECHANICALS) ×1 IMPLANT
UNDERPAD 30X30 (UNDERPADS AND DIAPERS) ×3 IMPLANT
WATER STERILE IRR 1000ML POUR (IV SOLUTION) ×4 IMPLANT

## 2017-04-17 NOTE — H&P (Signed)
HPI: Patient is seen today in consultation at the request of Dr. Rivard. Consulting physician Dr. Peter Ennever.  Patient is a 56-year-old gravida 5 para 4 who has been menopausal for the past few years. She states that for the past 6-9 months she's had intermittent occasional bright red bleeding that she can sit her to be more breakthrough bleeding. She was complicated past medical history of a pulmonary embolism, Hashimoto thyroiditis and lichen sclerosus. In June of this year she began having some infectious lesions on her face. She then noticed some pustules on her vulva and a new one on her left leg. She was showering and the one on her left leg drained and she became quite frustrated. She saw Dr. Rivard's care and she wanted to be seen by her gynecologist for vulvar lesions. As soon as she reported to Dr. Rivard the bleeding she had an appropriate endometrial biopsy.   She was placed into a dermatologist and saw Dr. Lomax. She was diagnosed with MRSA of the vulva and placed on antibiotics. Since being switched to doxycycline, she states that her lesions are starting to improve.  She states that in 2017 around August before going on for her vacation she was having pain in her left calf that she without with a charley horse. She continue to massage it and exercise through through the discomfort and noticed she began having symptoms in both legs. They had a prolonged drive home and then she experienced left shoulder pain and shortness of breath. She was subsequently diagnosed with a pulmonary embolism and was started on xarelto. She was seen by Dr. Ennever and per her report she had negative testing for any genetic predisposition for the blood clot. She was on medication until April of this year which was discontinued. She was told to take fish oil and stay hydrated. Interestingly her sister also had a DVT. The patient is not sure if she had testing done or not. She otherwise is usual state of health. She  is up-to-date on her mammograms that she did miss one last year. She had a colonoscopy and upper endoscopy at age 49. At that time based on the finding she was told to be on a gluten-free diet  Review of Systems: Constitutional: Denies fever. Skin: + MRSA lesions face, medial thighs, vulva, LSA on vulva improved with clobetosol Cardiovascular: No chest pain, shortness of breath, or edema  Gastro Intestinal:  No nausea, vomiting, constipation, or diarrhea reported.  Genitourinary: + vaginal bleeding  Musculoskeletal: No joint pain.  Psychology: Worrying, awake at night.  Current Meds:      Outpatient Encounter Prescriptions as of 03/14/2017  Medication Sig  . cephALEXin (KEFLEX) 500 MG capsule TK ONE C PO  BID  . doxycycline (VIBRA-TABS) 100 MG tablet TK 1 T PO BID WITH A FULL MEAL  . Multiple Vitamins-Minerals (BIOTECT PLUS) CAPS Take by mouth daily.  . thyroid (ARMOUR) 60 MG tablet Take 2 tabs 3 days a week  . thyroid (ARMOUR) 90 MG tablet Take 90 mg 4 days per week   No facility-administered encounter medications on file as of 03/14/2017.     Allergy:      Allergies  Allergen Reactions  . Bee Venom Anaphylaxis    Social Hx:   Social History        Social History  . Marital status: Married    Spouse name: N/A  . Number of children: N/A  . Years of education: N/A        Occupational History  . Not on file.         Social History Main Topics  . Smoking status: Never Smoker  . Smokeless tobacco: Never Used  . Alcohol use Yes     Comment: occassional  . Drug use: No  . Sexual activity: Yes       Other Topics Concern  . Not on file      Social History Narrative  . No narrative on file    Past Surgical Hx:       Past Surgical History:  Procedure Laterality Date  . MOUTH SURGERY    . PULMONARY EMBOLISM SURGERY  2017    Past Medical Hx:      Past Medical History:  Diagnosis Date  . Allergy   . Depression   . Endometrial ca  ([place]) 03/14/2017  . Hypothyroidism    Hashimoto  . Pulmonary embolism ([place]) 01/13/2016   After car trip    Oncology Hx:        Endometrial ca [facility])   03/07/2017 Initial Diagnosis    Endometrial ca [facility])       Family Hx:       Family History  Problem Relation Age of Onset  . Stroke Father   . Alzheimer's disease Father   . Hyperlipidemia Father   . Deep vein thrombosis Sister   . Breast cancer Paternal Grandmother        In her 33s, but lived to be very elderly  . Arthritis Mother   . Lung cancer Maternal Grandmother   . Diabetes Paternal Grandfather   . Pancreatic cancer Paternal Grandfather     Vitals:  Blood pressure 132/60, pulse (!) 54, temperature 97.8 F (36.6 C), temperature source Oral, resp. rate 18, height 5' 5.75" (1.67 m), weight 142 lb 11.2 oz (64.7 kg), last menstrual period 09/04/2012, SpO2 100 %.  Physical Exam:  Well-nourished well-developed female in no acute distress.  Neck: Supple, no lymphadenopathy, no thyromegaly.  Abdomen: Soft, nontender, nondistended. There are no palpable masses or prostatomegaly.  Groins: No lymphadenopathy.  Extremities: No edema.  Pelvic: External genitalia is slightly atrophic with some loss of the normal architecture but not significant changes. She has Band-Aids primarily on the right gluteal region with small sores. The majority of the sores are healing well, crusted over with no active purulence or erythema. Vagina slightly atrophic. The cervix is multiparous. There's a physiologic discharge. There are no visible lesions. Bimanual examination the uterus is of normal size shape and consistency. There are no adnexal masses. Rectal confirms.  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 I met with the patient and her husband today for greater than 80 minutes.  1) Endometrial cancer:We discussed the role of surgery including minimally  invasive surgery versus abdominal hysterectomy. We discussed that minimally invasive surgery would allow fasterrecovery,less pain and she is willing to proceed with that. I did discuss with her that we use a robotic platform. Her questions regarding minimally invasive surgery were elicited in answer to her satisfaction. We also discussed the role of sentinel lymph node removal versus complete lymphadenectomy. She understands that should she not map to one or both sides will need to proceed with a complete lymphadenectomy on both sides pending the results of the frozen section. She had questions regarding thing she had seen online that she would have prolapse symptoms etc. I answered her questions regarding this. They wanted to know questions regarding next steps in treatment  and outcome. Discuss with them that the majority of clinical stage I grade 1 endometrial cancers are cured with surgery alone however I be able to answer her exact questions was without final pathology. She is tentatively scheduled for surgery with me on November 6.  2) Pulmonary embolism: We discussed the most usual risks and benefits of surgery including but not limited to bleeding, infection, injury to surrounding organs and normal bowel disease. She is a personal history of a pulmonary edema in August of last year. She was treated with 6 months of xarelto and per the patient's report had negative hypercoagulability workup. I have sent a message to Dr.Ennever to see if he would recommend any additional DVT prophylaxis her treatment. What I suggested to the patient that she receive a dose of Lovenox preoperatively. She will have compression devices on during the surgery and I would continue prophylactic dose Lovenox for 2 weeks after surgery. Should his recommendations be different I will counsel the patient regarding this.  3) MRSA: She was positive for MRSA.  I appreciate the opportunity to participate in the care of this very  pleasant patient.    

## 2017-04-17 NOTE — Op Note (Signed)
PATIENT: Sherry Williams DATE OF BIRTH: 1960-10-02 ENCOUNTER DATE: 04/17/2017  Preop Diagnosis: Grade 1 endometrioid adenocarcinoma.   Postoperative Diagnosis: Same.   Surgery: Total robotic hysterectomy bilateral salpingo-oophorectomy, SLN removal on right and leftt pelvic lymph node dissection.   Surgeons:  Lucita Lora. Alycia Rossetti, MD; Lahoma Crocker, MD   Anesthesia: General   Estimated blood loss: 75 ml   IVF: 700 ml   Urine output: 800 ml   Complications: None   Pathology: Uterus, cervix, bilateral tubes and ovaries, SLN #1 right external; SLN #2 right pre-sacral; SLN #3 aortic bifurcation, left pelvic lymph nodes to pathology.   Operative findings: Normal uterus, cervix and bilateral adnexa. Normal intra-abdominal survey, successful mapping on right, no mapping on left and side specific node dissection was performed.  Procedure: The patient was identified in the preoperative holding area. Informed consent was signed on the chart. Patient was seen history was reviewed and exam was performed.   The patient was then taken to the operating room and placed in the supine position with SCD hose on. Her arms were tucked at her side with appropriate precautions on the gel pad. General anesthesia was then induced without difficulty. She was then placed in the dorsolithotomy position. Shoulder blocks were then placed in the usual fashion with appropriate precautions. A OG-tube was placed to suction. First timeout was performed to confirm the patient, procedure, antibiotic, allergy status, estimated blood loss and OR time. The perineum was then prepped in the usual fashion with Betadine. A 14 French Foley was inserted into the bladder under sterile conditions. A sterile speculum was placed in the vagina. The cervix was without lesions. The cervix was grasped with a single-tooth tenaculum. The dilator without difficulty. Intraoperative ICG injection at 3:00 and 9:00 per the FIRES protocol.  A ZUMI  with a medium Koe ring was placed without difficulty. The abdomen was then prepped with 1 Chlor prep sponge per protocol.   Patient was then draped after the prep was dried. Second timeout was performed to confirm the above. After again confirming OG tube placement and it was to suction. A stab-wound was made in left upper quadrant 2 cm below the costal margin on the left in the midclavicular line. A 5 mm operative report was used to assure intra-abdominal placement. The abdomen was insufflated. At this point all points during the procedure the patient's intra-abdominal pressure was not increased over 15 mm of mercury. After insufflation was complete, the patient was placed in deep Trendelenburg position. 25 cm above the pubic symphysis that area was marked the camera port. Bilateral robotic ports were marked 8 cm from the midline incision at approximately 0 angle. A 4th trocar was placed on the left. Under direct visualization each of the trochars was placed into the abdomen. The small bowel was folded on its mesentery to allow visualization to the pelvis. The 5 mm LUQ port was then converted to a 10/12 port under direct visualization.  After assuring adequate visualization, the robot was then docked in the usual fashion. Under direct visualization the robotic instruments replaced.   The round ligament on the patient's right side was transected with monopolar cautery. The anterior and posterior leaves of the broad ligament were then taken down in the usual fashion. The ureter was identified on the medial leaf of the broad ligament. The perirectal and paravesical spaced were opened and SLN identification per the FIRES protocol proceeded. The nodal tissue of the external iliac artery was removed. We then identified a  node at the right presacral space and an additional node at the bifurcation of the aorta. Each was removed individually and removed through the assistant trocar.   Our attention was then drawn to  opening the left side and the paravesical space and the perirectal space were also opened. The obturator nerve was identified. There was no mapping on this side. The nodal bundle extending over the external iliac artery down to the external iliac vein was taken down using sharp dissection and monopolar cautery. The genitofemoral nerve was identified and spared. We continued our dissection down to the level of the obturator nerve. The nodal bundle superior to the obturator nerve was taken. All pedicles were noted to be hemostatic the ureter was noted to be well medial of the area of dissection.   A window was made between the IP and the ureter on the right side. The IP was coagulated with bipolar cautery and transected. The posterior leaf of the broad ligament was taken down to the level of the KOH ring. The bladder flap was created using meticulous dissection and pinpoint cautery. The uterine vessels were coagulated with bipolar cautery. The uterine vessels were then transected and the C loop was created. The same procedure was performed on the patient's left side.   The pneumo-occulder in the vagina was then insufflated. The colpotomy was then created in the usual fashion. The vaginal cuff was closed with a running 0 Vicryl on CT 1 suture. The abdomen and pelvis were copiously irrigated and noted to be hemostatic. The robotic instruments were removed under direct visualization as were the robotic trochars. The pneumoperitoneum was removed. The patient was then taken out of the Trendelenburg position. Using of 0 Vicryl on a UR 6 needle the LUQ port fascia was closed after being grasped with allis clamps. The subcutaneous tissues of the port in the left upper quadrant was reapproximated. The skin was closed using monocryl and dermabond was placed. The pneumo occluded balloon was removed from the vagina. The vagina was swabbed and noted to be hemostatic.   All instrument needle and Ray-Tec counts were correct  x2. The patient tolerated the procedure well and was taken to the recovery room in stable condition. This is [facility] dictating an operative note on patient Sherry Williams.

## 2017-04-17 NOTE — Interval H&P Note (Signed)
History and Physical Interval Note:  04/17/2017 7:24 AM  Sherry Williams  has presented today for surgery, with the diagnosis of endometrial cancer  The various methods of treatment have been discussed with the patient and family. After consideration of risks, benefits and other options for treatment, the patient has consented to  Procedure(s): XI ROBOTIC ASSISTED TOTAL HYSTERECTOMY WITH BILATERAL SALPINGO OOPHORECTOMY WITH SENTINAL LYMPH NODE BIOPSY AND POSSIBLE LYMPHADENECTOMY (Bilateral) as a surgical intervention .  The patient's history has been reviewed, patient examined, no change in status, stable for surgery.  I have reviewed the patient's chart and labs.  Questions were answered to the patient's satisfaction.     [place] A.

## 2017-04-17 NOTE — Transfer of Care (Signed)
Immediate Anesthesia Transfer of Care Note  Patient: Sherry Williams  Procedure(s) Performed: XI ROBOTIC ASSISTED TOTAL HYSTERECTOMY WITH BILATERAL SALPINGO OOPHORECTOMY WITH SENTINAL LYMPH NODE BIOPSY AND POSSIBLE LYMPHADENECTOMY (Bilateral )  Patient Location: PACU  Anesthesia Type:General  Level of Consciousness: awake, alert  and patient cooperative  Airway & Oxygen Therapy: Patient Spontanous Breathing and Patient connected to face mask oxygen  Post-op Assessment: Report given to RN and Post -op Vital signs reviewed and stable  Post vital signs: Reviewed and stable  Last Vitals:  Vitals:   04/17/17 0538  BP: 133/73  Pulse: 65  Resp: 18  Temp: 36.7 C  SpO2: 100%    Last Pain:  Vitals:   04/17/17 0538  TempSrc: Oral      Patients Stated Pain Goal: 4 (12/05/92 8546)  Complications: No apparent anesthesia complications

## 2017-04-17 NOTE — Anesthesia Procedure Notes (Signed)
Procedure Name: Intubation Date/Time: 04/17/2017 7:47 AM Performed by: Dione Booze, CRNA Pre-anesthesia Checklist: Suction available, Patient being monitored, Emergency Drugs available and Patient identified Patient Re-evaluated:Patient Re-evaluated prior to induction Preoxygenation: Pre-oxygenation with 100% oxygen Induction Type: IV induction Ventilation: Mask ventilation without difficulty Laryngoscope Size: Mac and 4 Grade View: Grade I Tube type: Oral Tube size: 7.5 mm Placement Confirmation: positive ETCO2,  ETT inserted through vocal cords under direct vision and breath sounds checked- equal and bilateral Secured at: 22 cm Tube secured with: Tape Dental Injury: Teeth and Oropharynx as per pre-operative assessment

## 2017-04-17 NOTE — Anesthesia Postprocedure Evaluation (Signed)
Anesthesia Post Note  Patient: Sherry Williams  Procedure(s) Performed: XI ROBOTIC ASSISTED TOTAL HYSTERECTOMY WITH BILATERAL SALPINGO OOPHORECTOMY WITH SENTINAL LYMPH NODE BIOPSY AND POSSIBLE LYMPHADENECTOMY (Bilateral )     Patient location during evaluation: PACU Anesthesia Type: General Level of consciousness: sedated and patient cooperative Pain management: pain level controlled Vital Signs Assessment: post-procedure vital signs reviewed and stable Respiratory status: spontaneous breathing Cardiovascular status: stable Anesthetic complications: no    Last Vitals:  Vitals:   04/17/17 1255 04/17/17 1400  BP: 115/66 112/62  Pulse: (!) 57 60  Resp: 12 16  Temp: 36.5 C 36.7 C  SpO2: 100% 100%    Last Pain:  Vitals:   04/17/17 1400  TempSrc: Oral  PainSc:                  Nolon Nations

## 2017-04-18 ENCOUNTER — Encounter (HOSPITAL_COMMUNITY): Payer: Self-pay | Admitting: Gynecologic Oncology

## 2017-04-18 ENCOUNTER — Encounter (INDEPENDENT_AMBULATORY_CARE_PROVIDER_SITE_OTHER): Payer: Self-pay

## 2017-04-18 DIAGNOSIS — C541 Malignant neoplasm of endometrium: Secondary | ICD-10-CM | POA: Diagnosis not present

## 2017-04-18 LAB — CBC
HCT: 30.9 % — ABNORMAL LOW (ref 36.0–46.0)
Hemoglobin: 10.2 g/dL — ABNORMAL LOW (ref 12.0–15.0)
MCH: 29.4 pg (ref 26.0–34.0)
MCHC: 33 g/dL (ref 30.0–36.0)
MCV: 89 fL (ref 78.0–100.0)
Platelets: 180 10*3/uL (ref 150–400)
RBC: 3.47 MIL/uL — ABNORMAL LOW (ref 3.87–5.11)
RDW: 13 % (ref 11.5–15.5)
WBC: 7.1 10*3/uL (ref 4.0–10.5)

## 2017-04-18 LAB — BASIC METABOLIC PANEL
Anion gap: 7 (ref 5–15)
BUN: 10 mg/dL (ref 6–20)
CO2: 25 mmol/L (ref 22–32)
Calcium: 8.8 mg/dL — ABNORMAL LOW (ref 8.9–10.3)
Chloride: 104 mmol/L (ref 101–111)
Creatinine, Ser: 0.77 mg/dL (ref 0.44–1.00)
GFR calc Af Amer: >60 mL/min (ref >60)
GFR calc non Af Amer: >60 mL/min (ref >60)
Glucose, Bld: 122 mg/dL — ABNORMAL HIGH (ref 65–99)
Potassium: 4.1 mmol/L (ref 3.5–5.1)
Sodium: 136 mmol/L (ref 135–145)

## 2017-04-18 MED ORDER — TRAMADOL HCL 50 MG PO TABS: 50.0000 mg | ORAL_TABLET | Freq: Four times a day (QID) | ORAL | 0 refills | Status: DC | PRN

## 2017-04-18 MED ORDER — ENOXAPARIN SODIUM 40 MG/0.4ML ~~LOC~~ SOLN: 40.0000 mg | SUBCUTANEOUS | 0 refills | Status: DC

## 2017-04-18 NOTE — Discharge Instructions (Signed)
04/18/2017  Return to work: 4-6 weeks if applicable  Activity: 1. Be up and out of the bed during the day.  Take a nap if needed.  You may walk up steps but be careful and use the hand rail.  Stair climbing will tire you more than you think, you may need to stop part way and rest.   2. No lifting or straining for 6 weeks.  3. No driving for 1 week(s).  Do not drive if you are taking narcotic pain medicine.  4. Shower daily.  Use soap and water on your incision and pat dry; don't rub.  No tub baths until cleared by your surgeon.   5. No sexual activity and nothing in the vagina for 8 weeks.  6. You may experience a small amount of clear drainage from your incisions, which is normal.  If the drainage persists or increases, please call the office.  7. You may experience vaginal spotting after surgery or around the 6-8 week mark from surgery when the stitches at the top of the vagina begin to dissolve.  The spotting is normal but if you experience heavy bleeding, call our office.  8. Take Tylenol or ibuprofen first for pain and only use Percocet for severe pain not relieved by the Tylenol or Ibuprofen.  Monitor your Tylenol intake to a max of 4,000 mg a day since Percocet has Tylenol in it as well.  Diet: 1. Low sodium Heart Healthy Diet is recommended.  2. It is safe to use a laxative, such as Miralax or Colace, if you have difficulty moving your bowels. You can take Sennakot at bedtime every evening to keep bowel movements regular and to prevent constipation.    Wound Care: 1. Keep clean and dry.  Shower daily.  Reasons to call the Doctor:  Fever - Oral temperature greater than 100.4 degrees Fahrenheit  Foul-smelling vaginal discharge  Difficulty urinating  Nausea and vomiting  Increased pain at the site of the incision that is unrelieved with pain medicine.  Difficulty breathing with or without chest pain  New calf pain especially if only on one side  Sudden, continuing  increased vaginal bleeding with or without clots.   Contacts: For questions or concerns you should contact:  Dr. [name] at [place], [place] at [phone]  After Hours: call [phone] and have the GYN Oncologist paged/contacted  Tramadol tablets What is this medicine? TRAMADOL (TRA ma dole) is a pain reliever. It is used to treat moderate to severe pain in adults. This medicine may be used for other purposes; ask your health care provider or pharmacist if you have questions. COMMON BRAND NAME(S): Ultram What should I tell my health care provider before I take this medicine? They need to know if you have any of these conditions: -brain tumor -depression -drug abuse or addiction -head injury -if you frequently drink alcohol containing drinks -kidney disease or trouble passing urine -liver disease -lung disease, asthma, or breathing problems -seizures or epilepsy -suicidal thoughts, plans, or attempt; a previous suicide attempt by you or a family member -an unusual or allergic reaction to tramadol, codeine, other medicines, foods, dyes, or preservatives -pregnant or trying to get pregnant -breast-feeding How should I use this medicine? Take this medicine by mouth with a full glass of water. Follow the directions on the prescription label. You can take it with or without food. If it upsets your stomach, take it with food. Do not take your medicine more often than  directed. A special MedGuide will be given to you by the pharmacist with each prescription and refill. Be sure to read this information carefully each time. Talk to your pediatrician regarding the use of this medicine in children. Special care may be needed. Overdosage: If you think you have taken too much of this medicine contact a poison control center or emergency room at once. NOTE: This medicine is only for you. Do not share this medicine with others. What if I miss a dose? If you miss a dose,  take it as soon as you can. If it is almost time for your next dose, take only that dose. Do not take double or extra doses. What may interact with this medicine? Do not take this medication with any of the following medicines: -MAOIs like Carbex, Eldepryl, Marplan, Nardil, and Parnate This medicine may also interact with the following medications: -alcohol -antihistamines for allergy, cough and cold -certain medicines for anxiety or sleep -certain medicines for depression like amitriptyline, fluoxetine, sertraline -certain medicines for migraine headache like almotriptan, eletriptan, frovatriptan, naratriptan, rizatriptan, sumatriptan, zolmitriptan -certain medicines for seizures like carbamazepine, oxcarbazepine, phenobarbital, primidone -certain medicines that treat or prevent blood clots like warfarin -digoxin -furazolidone -general anesthetics like halothane, isoflurane, methoxyflurane, propofol -linezolid -local anesthetics like lidocaine, pramoxine, tetracaine -medicines that relax muscles for surgery -other narcotic medicines for pain or cough -phenothiazines like chlorpromazine, mesoridazine, prochlorperazine, thioridazine -procarbazine This list may not describe all possible interactions. Give your health care provider a list of all the medicines, herbs, non-prescription drugs, or dietary supplements you use. Also tell them if you smoke, drink alcohol, or use illegal drugs. Some items may interact with your medicine. What should I watch for while using this medicine? Tell your doctor or health care professional if your pain does not go away, if it gets worse, or if you have new or a different type of pain. You may develop tolerance to the medicine. Tolerance means that you will need a higher dose of the medicine for pain relief. Tolerance is normal and is expected if you take this medicine for a long time. Do not suddenly stop taking your medicine because you may develop a severe  reaction. Your body becomes used to the medicine. This does NOT mean you are addicted. Addiction is a behavior related to getting and using a drug for a non-medical reason. If you have pain, you have a medical reason to take pain medicine. Your doctor will tell you how much medicine to take. If your doctor wants you to stop the medicine, the dose will be slowly lowered over time to avoid any side effects. There are different types of narcotic medicines (opiates). If you take more than one type at the same time or if you are taking another medicine that also causes drowsiness, you may have more side effects. Give your health care provider a list of all medicines you use. Your doctor will tell you how much medicine to take. Do not take more medicine than directed. Call emergency for help if you have problems breathing or unusual sleepiness. You may get drowsy or dizzy. Do not drive, use machinery, or do anything that needs mental alertness until you know how this medicine affects you. Do not stand or sit up quickly, especially if you are an older patient. This reduces the risk of dizzy or fainting spells. Alcohol can increase or decrease the effects of this medicine. Avoid alcoholic drinks. You may have constipation. Try to have a bowel movement  at least every 2 to 3 days. If you do not have a bowel movement for 3 days, call your doctor or health care professional. Your mouth may get dry. Chewing sugarless gum or sucking hard candy, and drinking plenty of water may help. Contact your doctor if the problem does not go away or is severe. What side effects may I notice from receiving this medicine? Side effects that you should report to your doctor or health care professional as soon as possible: -allergic reactions like skin rash, itching or hives, swelling of the face, lips, or tongue -breathing problems -confusion -seizures -signs and symptoms of low blood pressure like dizziness; feeling faint or  lightheaded, falls; unusually weak or tired -trouble passing urine or change in the amount of urine Side effects that usually do not require medical attention (report to your doctor or health care professional if they continue or are bothersome): -constipation -dry mouth -nausea, vomiting -tiredness This list may not describe all possible side effects. Call your doctor for medical advice about side effects. You may report side effects to FDA at 1-800-FDA-1088. Where should I keep my medicine? Keep out of the reach of children. This medicine may cause accidental overdose and death if it taken by other adults, children, or pets. Mix any unused medicine with a substance like cat litter or coffee grounds. Then throw the medicine away in a sealed container like a sealed bag or a coffee can with a lid. Do not use the medicine after the expiration date. Store at room temperature between 15 and 30 degrees C (59 and 86 degrees F). NOTE: This sheet is a summary. It may not cover all possible information. If you have questions about this medicine, talk to your doctor, pharmacist, or health care provider.  2018 Elsevier/Gold Standard (2015-02-21 09:00:04)

## 2017-04-18 NOTE — Discharge Summary (Signed)
Physician Discharge Summary  Patient ID: Sherry Williams MRN: [id number] DOB/AGE: [birth year] 56 y.o.  Admit date: 04/17/2017 Discharge date: 04/18/2017  Admission Diagnoses: Endometrial cancer [facility])  Discharge Diagnoses:  Principal Problem:   Endometrial cancer [facility])  Discharged Condition:  The patient is in good condition and stable for discharge.    Hospital Course: On 04/17/2017, the patient underwent the following: Procedure(s): XI ROBOTIC ASSISTED TOTAL HYSTERECTOMY WITH BILATERAL SALPINGO OOPHORECTOMY WITH SENTINAL LYMPH NODE BIOPSY AND Left LYMPHADENECTOMY.  The postoperative course was uneventful.  She was discharged to home on postoperative day 1 tolerating a regular diet, minimal pain, voiding, ambulating, passing flatus.  She is discharged home with a total of 2 weeks of lovenox due to history of PE.  Consults: None  Significant Diagnostic Studies: None  Treatments: surgery: see above  Discharge Exam: Blood pressure (!) 109/58, pulse 62, temperature 98.2 F (36.8 C), temperature source Oral, resp. rate 18, height 5\' 5"  (1.651 m), weight 139 lb (63 kg), last menstrual period 09/04/2012, SpO2 100 %. General appearance: alert, cooperative and no distress Resp: clear to auscultation bilaterally Cardio: regular rate and rhythm, S1, S2 normal, no murmur, click, rub or gallop Extremities: extremities normal, atraumatic, no cyanosis or edema Incision/Wound: Lap sites to the abdomen with dermabond without erythema or drainage  Disposition: 01-Home or Self Care  Discharge Instructions    Call MD for:  difficulty breathing, headache or visual disturbances   Complete by:  As directed    Call MD for:  extreme fatigue   Complete by:  As directed    Call MD for:  hives   Complete by:  As directed    Call MD for:  persistant dizziness or light-headedness   Complete by:  As directed    Call MD for:  persistant nausea and vomiting   Complete by:  As directed    Call MD for:   redness, tenderness, or signs of infection (pain, swelling, redness, odor or green/yellow discharge around incision site)   Complete by:  As directed    Call MD for:  severe uncontrolled pain   Complete by:  As directed    Call MD for:  temperature >100.4   Complete by:  As directed    Diet - low sodium heart healthy   Complete by:  As directed    Driving Restrictions   Complete by:  As directed    No driving for 1 week.  Do not take narcotics and drive.   Increase activity slowly   Complete by:  As directed    Lifting restrictions   Complete by:  As directed    No lifting greater than 10 lbs.   Sexual Activity Restrictions   Complete by:  As directed    No sexual activity, nothing in the vagina, for 8 weeks.     Allergies as of 04/18/2017      Reactions   Bee Venom Anaphylaxis   Adhesive [tape] Other (See Comments)   Irritation and red      Medication List    TAKE these medications   enoxaparin 40 MG/0.4ML injection Commonly known as:  LOVENOX Inject 0.4 mLs (40 mg total) daily into the skin.   OIL OF OREGANO PO Take 1 tablet by mouth daily.   OVER THE COUNTER MEDICATION Take 0.5 scoop by mouth See admin instructions. ClearVite - Mix 0.5 scoop in drink once daily   PROBIOTIC PO Take 1 capsule by mouth daily. Pro 50 Probiotic   thyroid  90 MG tablet Commonly known as:  ARMOUR Take 90 mg 4 days per week What changed:    how much to take  how to take this  when to take this  additional instructions   thyroid 60 MG tablet Commonly known as:  ARMOUR Take 2 tabs 3 days a week What changed:    how much to take  how to take this  when to take this  additional instructions   traMADol 50 MG tablet Commonly known as:  ULTRAM Take 1 tablet (50 mg total) every 6 (six) hours as needed by mouth.   TURMERIC PO Take 0.5 scoop by mouth See admin instructions. Mix 0.5 scoop in drink once daily        Greater than thirty minutes were spend for face to face  discharge instructions and discharge orders/summary in EPIC.   Signed: CROSS, MELISSA DEAL 04/18/2017, 9:15 AM

## 2017-04-19 ENCOUNTER — Telehealth: Payer: Self-pay | Admitting: *Deleted

## 2017-04-19 NOTE — Telephone Encounter (Signed)
Called and gave the patient her post op appt

## 2017-05-22 NOTE — Progress Notes (Signed)
Consult Note: Gyn-Onc  Sherry Williams 56 y.o. female  CC:  Chief Complaint  Patient presents with  . Endometrial ca [name])    HPI: Patient is seen today in consultation at the request of Dr. Cletis Media. Consulting physician Dr. Burney Gauze.  Patient is a 56 year old gravida 5 para 4 who has been menopausal for the past few years. She states that for the past 6-9 months she's had intermittent occasional bright red bleeding that she can sit her to be more breakthrough bleeding. She was complicated past medical history of a pulmonary embolism, Hashimoto thyroiditis and lichen sclerosus. In June of this year she began having some infectious lesions on her face. She then noticed some pustules on her vulva and a new one on her left leg. She was showering and the one on her left leg drained and she became quite frustrated. She saw Dr. Boyd Kerbs care and she wanted to be seen by her gynecologist for vulvar lesions. As soon as she reported to Dr. Cletis Media the bleeding she had an appropriate endometrial biopsy.   She was placed into a dermatologist and saw Dr. Ubaldo Glassing. She was diagnosed with MRSA of the vulva and placed on antibiotics. Since being switched to doxycycline, she states that her lesions are starting to improve.  She states that in 2017 around August before going on for her vacation she was having pain in her left calf that she without with a charley horse. She continue to massage it and exercise through through the discomfort and noticed she began having symptoms in both legs. They had a prolonged drive home and then she experienced left shoulder pain and shortness of breath. She was subsequently diagnosed with a pulmonary embolism and was started on xarelto. She was seen by Dr. Marin Olp and per her report she had negative testing for any genetic predisposition for the blood clot. She was on medication until April of this year which was discontinued. She was told to take fish oil and stay hydrated.  Interestingly her sister also had a DVT. The patient is not sure if she had testing done or not. She otherwise is usual state of health. She is up-to-date on her mammograms that she did miss one last year. She had a colonoscopy and upper endoscopy at age 25. At that time based on the finding she was told to be on a gluten-free diet  Interval History: ENCOUNTER DATE: 04/17/2017 Surgery: Total robotic hysterectomy bilateral salpingo-oophorectomy, SLN removal on right and leftt pelvic lymph node dissection.  Pathology: Uterus, cervix, bilateral tubes and ovaries, SLN #1 right external; SLN #2 right pre-sacral; SLN #3 aortic bifurcation, left pelvic lymph nodes to pathology.  Operative findings: Normal uterus, cervix and bilateral adnexa. Normal intra-abdominal survey, successful mapping on right, no mapping on left and side specific node dissection was performed.  Diagnosis 1. Lymph node, sentinel, biopsy, right external - ONE OF ONE LYMPH NODE NEGATIVE FOR CARCINOMA (0/1). 2. Lymph node, sentinel, biopsy, right presacral - ONE OF ONE LYMPH NODE NEGATIVE FOR CARCINOMA (0/1). 3. Lymph node, sentinel, biopsy, aortic bifurcation - BENIGN FIBROADIPOSE TISSUE. 4. Lymph node, biopsy, left pelvic - TWO OF TWO LYMPH NODE NEGATIVE FOR CARCINOMA (0/2). 5. 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 TUMOR INVADES LESS THAN ONE HALF OF THE MYOMETRIUM. SEE ONCOLOGY TABLE. -SEROSA: NO MALIGNANCY IDENTIFIED. - CERVIX: BENIGN SQUAMOUS AND ENDOCERVICAL MUCOSA. NO DYSPLASIA OR MALIGNANCY. - BILATERAL OVARIES: INCLUSION CYSTS. NO MALIGNANCY. -  BILATERAL FALLOPIAN TUBES: PARATUBAL CYSTS. NO MALIGNANCY.  She comes in today for her post-op check. She's overall doing quite well. She is feeling well and is looking forward to getting back into her regular routine and doing more lifting and activity. She had questions  about her bill. She is working through the appropriate channels for that.  Current Meds:  Outpatient Encounter Medications as of 05/23/2017  Medication Sig  . Coenzyme Q10 (COQ10) 50 g POWD Take 50 g by mouth every morning.  Marland Kitchen OVER THE COUNTER MEDICATION Take 0.5 scoop by mouth See admin instructions. ClearVite - Mix 0.5 scoop in drink once daily  . Probiotic Product (PROBIOTIC PO) Take 1 capsule by mouth daily. Pro 50 Probiotic  . thyroid (ARMOUR) 60 MG tablet Take 2 tabs 3 days a week (Patient taking differently: Take 60 mg by mouth See admin instructions. Take 60 mg by mouth daily on Monday, Wednesday and Friday.)  . thyroid (ARMOUR) 90 MG tablet Take 90 mg 4 days per week (Patient taking differently: Take 90 mg by mouth See admin instructions. Take 90 mg by mouth daily on Sunday, Tuesday, Thursday and Saturday.)  . [DISCONTINUED] enoxaparin (LOVENOX) 40 MG/0.4ML injection Inject 0.4 mLs (40 mg total) daily into the skin.  . [DISCONTINUED] OIL OF OREGANO PO Take 1 tablet by mouth daily.  . [DISCONTINUED] traMADol (ULTRAM) 50 MG tablet Take 1 tablet (50 mg total) every 6 (six) hours as needed by mouth.  . [DISCONTINUED] TURMERIC PO Take 0.5 scoop by mouth See admin instructions. Mix 0.5 scoop in drink once daily   No facility-administered encounter medications on file as of 05/23/2017.     Allergy:  Allergies  Allergen Reactions  . Bee Venom Anaphylaxis  . Adhesive [Tape] Other (See Comments)    Irritation and red    Social Hx:   Social History   Socioeconomic History  . Marital status: Married    Spouse name: Not on file  . Number of children: Not on file  . Years of education: Not on file  . Highest education level: Not on file  Social Needs  . Financial resource strain: Not on file  . Food insecurity - worry: Not on file  . Food insecurity - inability: Not on file  . Transportation needs - medical: Not on file  . Transportation needs - non-medical: Not on file   Occupational History  . Not on file  Tobacco Use  . Smoking status: Never Smoker  . Smokeless tobacco: Never Used  Substance and Sexual Activity  . Alcohol use: Yes    Comment: occassional  . Drug use: No  . Sexual activity: Yes  Other Topics Concern  . Not on file  Social History Narrative  . Not on file    Past Surgical Hx:  Past Surgical History:  Procedure Laterality Date  . MOUTH SURGERY    . ROBOTIC ASSISTED TOTAL HYSTERECTOMY WITH BILATERAL SALPINGO OOPHERECTOMY Bilateral 04/17/2017   Procedure: XI ROBOTIC ASSISTED TOTAL HYSTERECTOMY WITH BILATERAL SALPINGO OOPHORECTOMY WITH SENTINAL LYMPH NODE BIOPSY AND POSSIBLE LYMPHADENECTOMY;  Surgeon: [name], MD;  Location: [facility];  Service: Gynecology;  Laterality: Bilateral;    Past Medical Hx:  Past Medical History:  Diagnosis Date  . Allergy   . Depression   . DVT (deep venous thrombosis) ([place])    lower extremity   . Endometrial ca ([place]) 03/14/2017  . Family history of adverse reaction to anesthesia    children had N/V   . Hypothyroidism  Hashimoto  . Lichen sclerosus   . MRSA (methicillin resistant staph aureus) culture positive   . Pulmonary embolism ([place]) 01/13/2016   After car trip    Oncology Hx:    Endometrial ca [facility])   03/07/2017 Initial Diagnosis    Endometrial ca [facility])       Family Hx:  Family History  Problem Relation Age of Onset  . Stroke Father   . Alzheimer's disease Father   . Hyperlipidemia Father   . Deep vein thrombosis Sister   . Breast cancer Paternal Grandmother        In her 22s, but lived to be very elderly  . Arthritis Mother   . Lung cancer Maternal Grandmother   . Diabetes Paternal Grandfather   . Pancreatic cancer Paternal Grandfather     Vitals:  Blood pressure 113/62, pulse 67, temperature 97.6 F (36.4 C), temperature source Oral, resp. rate 18, height 5' 5.75" (1.67 m), weight 138 lb 6.4 oz (62.8 kg), last menstrual period 09/04/2012, SpO2 100 %.  Physical  Exam:  Well-nourished well-developed female in no acute distress.  Abdomen: Soft, nontender, nondistended. Well-healing surgical incisions. No evidence of any incisional hernias.  Extremities: No edema.  Pelvic: External genitalia is slightly atrophic with some loss of the normal architecture but not significant changes. The vaginal cuff is visualized. The suture line is intact. On bimanual examination, there is no fluctuance, tenderness, masses.  Assessment/Plan:  56 year old with stage IA grade 1 endometrioid adenocarcinoma s/p definitive surgery 04/2017. She does not require any additional therapy.  1) Endometrial cancer: We reviewed her pathology today she is very pleased with the results. She is doing very well postoperatively. I asked her to refrain from intercourse or heavier lifting such as her grandson for additional 3 weeks to allow the suture line to completely heal. She will follow-up with [place] in 6 months and follow up with Dr. Cletis Media after that and we'll continue alternating appointments for 5 years.  We did discuss that there is approximately 3-5% chance of the malignancy returning. The most common site for recurrence would be a top of the vaginal cuff. Therefore when she is completely healed issue such as bleeding will be incredibly critical for her to follow-up with in a timely fashion.  I appreciate the opportunity to participate in the care of this very pleasant patient. Jowana Thumma A., MD 05/23/2017, 11:06 AM

## 2017-05-23 ENCOUNTER — Ambulatory Visit: Payer: 59 | Attending: Gynecologic Oncology | Admitting: Gynecologic Oncology

## 2017-05-23 ENCOUNTER — Encounter: Payer: Self-pay | Admitting: Gynecologic Oncology

## 2017-05-23 VITALS — BP 113/62 | HR 67 | Temp 97.6°F | Resp 18 | Ht 65.75 in | Wt 138.4 lb

## 2017-05-23 DIAGNOSIS — F329 Major depressive disorder, single episode, unspecified: Secondary | ICD-10-CM | POA: Diagnosis not present

## 2017-05-23 DIAGNOSIS — Z90710 Acquired absence of both cervix and uterus: Secondary | ICD-10-CM | POA: Insufficient documentation

## 2017-05-23 DIAGNOSIS — E063 Autoimmune thyroiditis: Secondary | ICD-10-CM | POA: Diagnosis not present

## 2017-05-23 DIAGNOSIS — Z86711 Personal history of pulmonary embolism: Secondary | ICD-10-CM | POA: Insufficient documentation

## 2017-05-23 DIAGNOSIS — Z9889 Other specified postprocedural states: Secondary | ICD-10-CM | POA: Insufficient documentation

## 2017-05-23 DIAGNOSIS — C541 Malignant neoplasm of endometrium: Secondary | ICD-10-CM | POA: Diagnosis not present

## 2017-05-23 NOTE — Patient Instructions (Signed)
Return to clinic in 6 months to see [place] in GYN oncology. After that we'll begin alternating appointments with Dr. Cletis Media.

## 2017-11-14 ENCOUNTER — Emergency Department (HOSPITAL_COMMUNITY)
Admission: EM | Admit: 2017-11-14 | Discharge: 2017-11-14 | Disposition: A | Discharge status: Home / Self Care | Payer: 59 | Attending: Emergency Medicine | Admitting: Emergency Medicine

## 2017-11-14 ENCOUNTER — Other Ambulatory Visit: Payer: Self-pay

## 2017-11-14 ENCOUNTER — Ambulatory Visit: Payer: Self-pay

## 2017-11-14 ENCOUNTER — Emergency Department (HOSPITAL_COMMUNITY): Payer: 59

## 2017-11-14 ENCOUNTER — Encounter (HOSPITAL_COMMUNITY): Payer: Self-pay | Admitting: Emergency Medicine

## 2017-11-14 DIAGNOSIS — Z79899 Other long term (current) drug therapy: Secondary | ICD-10-CM | POA: Diagnosis not present

## 2017-11-14 DIAGNOSIS — R0789 Other chest pain: Secondary | ICD-10-CM | POA: Diagnosis not present

## 2017-11-14 DIAGNOSIS — E039 Hypothyroidism, unspecified: Secondary | ICD-10-CM | POA: Diagnosis not present

## 2017-11-14 DIAGNOSIS — R079 Chest pain, unspecified: Secondary | ICD-10-CM | POA: Insufficient documentation

## 2017-11-14 DIAGNOSIS — Z86711 Personal history of pulmonary embolism: Secondary | ICD-10-CM | POA: Insufficient documentation

## 2017-11-14 LAB — CBC
HCT: 39 % (ref 36.0–46.0)
Hemoglobin: 12.8 g/dL (ref 12.0–15.0)
MCH: 29.2 pg (ref 26.0–34.0)
MCHC: 32.8 g/dL (ref 30.0–36.0)
MCV: 88.8 fL (ref 78.0–100.0)
Platelets: 261 10*3/uL (ref 150–400)
RBC: 4.39 MIL/uL (ref 3.87–5.11)
RDW: 12.6 % (ref 11.5–15.5)
WBC: 4 10*3/uL (ref 4.0–10.5)

## 2017-11-14 LAB — BASIC METABOLIC PANEL
Anion gap: 10 (ref 5–15)
BUN: 13 mg/dL (ref 6–20)
CO2: 26 mmol/L (ref 22–32)
Calcium: 9.4 mg/dL (ref 8.9–10.3)
Chloride: 104 mmol/L (ref 101–111)
Creatinine, Ser: 0.81 mg/dL (ref 0.44–1.00)
GFR calc Af Amer: >60 mL/min (ref >60)
GFR calc non Af Amer: >60 mL/min (ref >60)
Glucose, Bld: 90 mg/dL (ref 65–99)
Potassium: 4.4 mmol/L (ref 3.5–5.1)
Sodium: 140 mmol/L (ref 135–145)

## 2017-11-14 LAB — I-STAT TROPONIN, ED: Troponin i, poc: 0 ng/mL (ref 0.00–0.08)

## 2017-11-14 MED ORDER — IOPAMIDOL (ISOVUE-370) INJECTION 76%
100.0000 mL | Freq: Once | INTRAVENOUS | Status: AC | PRN
  Administered 2017-11-14: 100 mL via INTRAVENOUS

## 2017-11-14 MED ORDER — IOPAMIDOL (ISOVUE-370) INJECTION 76%
INTRAVENOUS | Status: AC
  Filled 2017-11-14: qty 100

## 2017-11-14 NOTE — Telephone Encounter (Signed)
Monitor for ER arrival 

## 2017-11-14 NOTE — ED Triage Notes (Signed)
Pt reports 3-4 weeks of constant chest tightness to central chest, newly radiating into right shoulder, right underarm. Pt states she has been very stressed out from work for the past several weeks. Took an asprin this morning. Denies SOB, nausea, dizziness. Pt does endorse a dry cough. Endorses travel in April. Discomfort 4/10.

## 2017-11-14 NOTE — Discharge Instructions (Signed)
We have ruled out a pulmonary embolism and a heart attack today.  We think you may have chest wall pain or chest pain related to stress, however please follow-up with your doctor for further testing including your thyroid levels.  You can use ice and heat on your chest and upper back alternating 20 minutes on, 20 minutes off.  Please return to the emergency department if you develop any new or worsening symptoms.

## 2017-11-14 NOTE — Telephone Encounter (Signed)
Confirmed patient has arrived in the ER now. 

## 2017-11-14 NOTE — Telephone Encounter (Signed)
Pt. Called to report "tightness" in right breast that goes around to right shoulder blade, and up to posterior neck.  Stated she isn't sure how long it's been there, but that it's been more uncomfortable over past 1.5 weeks.  Reported hx of PE approx. 2 yrs ago; off anticoagulation.  Reported she has been under additional stress at work; sleeping only 4-5 hrs./ night.  Stated the project was completed yesterday, and concerned that symptoms are still present.  Stated the tightness is present at this time; worsens when opens arm wide.  Denied dizziness, shortness of breath, nausea or vomiting.  Reported dry cough past 2 nights.  Denied any strenuous activity recently.  Advised to go to the ER for evaluation, due to hx of PE.  Pt. Verb. Understanding and agrees with plan.          Reason for Disposition . History of prior "blood clot" in leg or lungs (i.e., deep vein thrombosis, pulmonary embolism)    Reported "tightness in right breast area to right shoulder blade and up to post. Neck."  Answer Assessment - Initial Assessment Questions 1. LOCATION: "Where does it hurt?"       Right breast to right shoulder blade and up towards back of neck  2. RADIATION: "Does the pain go anywhere else?" (e.g., into neck, jaw, arms, back)     See above  3. ONSET: "When did the chest pain begin?" (Minutes, hours or days)      About 1.5 weeks ago 4. PATTERN "Does the pain come and go, or has it been constant since it started?"  "Does it get worse with exertion?"      The "tightness" comes and goes and is more obvious with certain movement   5. DURATION: "How long does it last" (e.g., seconds, minutes, hours)     General tightness is present continuously 6. SEVERITY: "How bad is the pain?"  (e.g., Scale 1-10; mild, moderate, or severe)    - MILD (1-3): doesn't interfere with normal activities     - MODERATE (4-7): interferes with normal activities or awakens from sleep    - SEVERE (8-10): excruciating pain, unable to  do any normal activities      "mild"  7. CARDIAC RISK FACTORS: "Do you have any history of heart problems or risk factors for heart disease?" (e.g., prior heart attack, angina; high blood pressure, diabetes, being overweight, high cholesterol, smoking, or strong family history of heart disease)     Has low heart rate;  8. PULMONARY RISK FACTORS: "Do you have any history of lung disease?"  (e.g., blood clots in lung, asthma, emphysema, birth control pills)     Hx of PE; denied recent surgery or prolonged travel 9. CAUSE: "What do you think is causing the chest pain?"     unknown 10. OTHER SYMPTOMS: "Do you have any other symptoms?" (e.g., dizziness, nausea, vomiting, sweating, fever, difficulty breathing, cough)    Denied dizziness, nausea or vomiting; denied shortness of breath; reported cough past 2 nights (dry)  Protocols used: CHEST PAIN-A-AH

## 2017-11-14 NOTE — ED Notes (Signed)
Patient transported to CT 

## 2017-11-14 NOTE — ED Notes (Signed)
Pt stable, ambulatory, states understanding of discharge instructions 

## 2017-11-14 NOTE — ED Provider Notes (Signed)
[place] EMERGENCY DEPARTMENT Provider Note   CSN: [id number] Arrival date & time: 11/14/17  1055     History   Chief Complaint Chief Complaint  Patient presents with  . Chest Pain    HPI Sherry Williams is a 57 y.o. female with history of PE and DVT, hypothyroidism, depression who presents with a 3 to 4-week history of constant chest tightness.  She reports radiation to her right shoulder, right underarm, and right-sided neck.  She reports she has been very stressed from work recently and thought it may be related to anxiety.  Patient reports having a long trip in April, however it was broken up with several stops.  She denies any new leg pain or swelling.  She is also had a dry cough recently.  She denies any shortness of breath, nausea, dizziness.  She denies any significant chest pain, but the chest tightness is persistent even though her stress has died down at work.  She took an aspirin prior to arrival without relief.  She is no longer anticoagulated after her PE in 2017.  HPI  Past Medical History:  Diagnosis Date  . Allergy   . Depression   . DVT (deep venous thrombosis) ([place])    lower extremity   . Endometrial ca ([place]) 03/14/2017  . Family history of adverse reaction to anesthesia    children had N/V   . Hypothyroidism    Hashimoto  . Lichen sclerosus   . MRSA (methicillin resistant staph aureus) culture positive   . Pulmonary embolism ([place]) 01/13/2016   After car trip    Patient Active Problem List   Diagnosis Date Noted  . Endometrial cancer ([place]) 04/17/2017  . Endometrial ca ([place]) 03/14/2017  . MRSA (methicillin resistant staph aureus) culture positive 03/14/2017  . Lichen sclerosus et atrophicus 03/14/2017  . Pulmonary embolus ([place]) 08/14/2016  . 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 Pulmonary embolism ([place]) 01/13/2016  . RUQ pain     Past Surgical History:  Procedure Laterality Date  . MOUTH SURGERY    . ROBOTIC  ASSISTED TOTAL HYSTERECTOMY WITH BILATERAL SALPINGO OOPHERECTOMY Bilateral 04/17/2017   Procedure: XI ROBOTIC ASSISTED TOTAL HYSTERECTOMY WITH BILATERAL SALPINGO OOPHORECTOMY WITH SENTINAL LYMPH NODE BIOPSY AND POSSIBLE LYMPHADENECTOMY;  Surgeon: [name], MD;  Location: [facility];  Service: Gynecology;  Laterality: Bilateral;     OB History   None      Home Medications    Prior to Admission medications   Medication Sig Start Date End Date Taking? Authorizing Provider  Coenzyme Q10 (COQ10) 50 g POWD Take 50 g by mouth every morning.    [provider]  OVER THE COUNTER MEDICATION Take 0.5 scoop by mouth See admin instructions. ClearVite - Mix 0.5 scoop in drink once daily    [provider]  Probiotic Product (PROBIOTIC PO) Take 1 capsule by mouth daily. Pro 50 Probiotic    [provider]  thyroid (ARMOUR) 60 MG tablet Take 2 tabs 3 days a week Patient taking differently: Take 60 mg by mouth See admin instructions. Take 60 mg by mouth daily on Monday, Wednesday and Friday. 03/06/17   Burchette, Alinda Sierras, MD  thyroid (ARMOUR) 90 MG tablet Take 90 mg 4 days per week Patient taking differently: Take 90 mg by mouth See admin instructions. Take 90 mg by mouth daily on Sunday, Tuesday, Thursday and Saturday. 03/06/17   Burchette, Alinda Sierras, MD    Family History Family History  Problem Relation Age  of Onset  . Stroke Father   . Alzheimer's disease Father   . Hyperlipidemia Father   . Deep vein thrombosis Sister   . Breast cancer Paternal Grandmother        In her 64s, but lived to be very elderly  . Arthritis Mother   . Lung cancer Maternal Grandmother   . Diabetes Paternal Grandfather   . Pancreatic cancer Paternal Grandfather     Social History Social History   Tobacco Use  . Smoking status: Never Smoker  . Smokeless tobacco: Never Used  Substance Use Topics  . Alcohol use: Yes    Comment: occassional  . Drug use: No     Allergies   Bee venom and  Adhesive [tape]   Review of Systems Review of Systems  Constitutional: Negative for chills and fever.  HENT: Negative for facial swelling and sore throat.   Respiratory: Positive for chest tightness. Negative for shortness of breath.   Cardiovascular: Negative for chest pain.  Gastrointestinal: Negative for abdominal pain, nausea and vomiting.  Genitourinary: Negative for dysuria.  Musculoskeletal: Negative for back pain.  Skin: Negative for rash and wound.  Neurological: Negative for dizziness, light-headedness and headaches.  Psychiatric/Behavioral: The patient is not nervous/anxious.      Physical Exam Updated Vital Signs BP 98/68   Pulse (!) 58   Temp 98.2 F (36.8 C) (Oral)   Resp 18   LMP 09/04/2012   SpO2 100%   Physical Exam  Constitutional: She appears well-developed and well-nourished. No distress.  HENT:  Head: Normocephalic and atraumatic.  Mouth/Throat: Oropharynx is clear and moist. No oropharyngeal exudate.  Eyes: Pupils are equal, round, and reactive to light. Conjunctivae are normal. Right eye exhibits no discharge. Left eye exhibits no discharge. No scleral icterus.  Neck: Normal range of motion. Neck supple. No thyromegaly present.  Cardiovascular: Normal rate, regular rhythm, normal heart sounds and intact distal pulses. Exam reveals no gallop and no friction rub.  No murmur heard. Pulmonary/Chest: Effort normal and breath sounds normal. No stridor. No respiratory distress. She has no wheezes. She has no rales. She exhibits no tenderness.  Abdominal: Soft. Bowel sounds are normal. She exhibits no distension. There is no tenderness. There is no rebound and no guarding.  Musculoskeletal: She exhibits no edema.       Right lower leg: She exhibits no tenderness and no edema.       Left lower leg: She exhibits no tenderness and no edema.  Lymphadenopathy:    She has no cervical adenopathy.  Neurological: She is alert. Coordination normal.  Skin: Skin is  warm and dry. No rash noted. She is not diaphoretic. No pallor.  Psychiatric: She has a normal mood and affect.  Nursing note and vitals reviewed.    ED Treatments / Results  Labs (all labs ordered are listed, but only abnormal results are displayed) Labs Reviewed  BASIC METABOLIC PANEL  CBC  I-STAT TROPONIN, ED    EKG EKG Interpretation  Date/Time:  Wednesday November 14 2017 11:07:44 EDT Ventricular Rate:  73 PR Interval:  104 QRS Duration: 84 QT Interval:  392 QTC Calculation: 431 R Axis:   84 Text Interpretation:  Poor data quality, interpretation may be adversely affected Sinus rhythm with sinus arrhythmia with short PR Otherwise normal ECG Confirmed by Davonna Belling [phone]) on 11/14/2017 2:45:55 PM   Radiology Dg Chest 2 View  Result Date: 11/14/2017 CLINICAL DATA:  Chest pain. EXAM: CHEST - 2 VIEW COMPARISON:  Radiographs of  January 12, 2016. FINDINGS: The heart size and mediastinal contours are within normal limits. Both lungs are clear. No pneumothorax or pleural effusion is noted. The visualized skeletal structures are unremarkable. IMPRESSION: No active cardiopulmonary disease. Electronically Signed   By: Marijo Conception, M.D.   On: 11/14/2017 11:45   Ct Angio Chest Pe W And/or Wo Contrast  Result Date: 11/14/2017 CLINICAL DATA:  Chest tightness for 1 month, now radiating to right shoulder. Cough. Recent travel. History of pulmonary embolism 2 years ago, endometrial cancer. EXAM: CT ANGIOGRAPHY CHEST WITH CONTRAST TECHNIQUE: Multidetector CT imaging of the chest was performed using the standard protocol during bolus administration of intravenous contrast. Multiplanar CT image reconstructions and MIPs were obtained to evaluate the vascular anatomy. CONTRAST:  123mL ISOVUE-370 IOPAMIDOL (ISOVUE-370) INJECTION 76% COMPARISON:  Chest radiograph November 14, 2017 and CT chest April 20, 2016 FINDINGS: CARDIOVASCULAR: Adequate contrast opacification of the pulmonary artery's. Main  pulmonary artery is not enlarged. No pulmonary arterial filling defects to the level of the subsegmental branches. Heart size is normal, no right heart strain. No pericardial effusion. Thoracic aorta is normal course and caliber, unremarkable. MEDIASTINUM/NODES: No lymphadenopathy by CT size criteria. LUNGS/PLEURA: Tracheobronchial tree is patent, no pneumothorax. Minimal right middle lobe scarring. No pleural effusions, focal consolidations, pulmonary nodules or masses. Dependent atelectasis. UPPER ABDOMEN: 5 mm right interpolar nephrolithiasis. MUSCULOSKELETAL: Nonacute. Mild upper thoracic broad dextroscoliosis. Severe L1-2 spondylosis. Review of the MIP images confirms the above findings. IMPRESSION: 1. No pulmonary embolism or acute cardiopulmonary process. 2. 5 mm non-obstructing right nephrolithiasis. Electronically Signed   By: Elon Alas M.D.   On: 11/14/2017 14:40    Procedures Procedures (including critical care time)  Medications Ordered in ED Medications  iopamidol (ISOVUE-370) 76 % injection (has no administration in time range)  iopamidol (ISOVUE-370) 76 % injection 100 mL (100 mLs Intravenous Contrast Given 11/14/17 1405)     Initial Impression / Assessment and Plan / ED Course  I have reviewed the triage vital signs and the nursing notes.  Pertinent labs & imaging results that were available during my care of the patient were reviewed by me and considered in my medical decision making (see chart for details).     Patient with nonspecific chest pain.  PE ruled out by CT angios chest.  Suspect anxiety/stress versus chest wall pain.  Troponin negative and with pain constant for 3 to 4 weeks, delta troponin not indicated.  Chest x-ray is negative.  CBC, BMP unremarkable.  Patient with stable vitals.  Supportive treatment discussed including ice, heat, NSAIDs.  Patient to follow-up with PCP for further evaluation and management.  I advised evaluation of thyroid.  No signs of  thyroid emergency today.  Patient's pain is very atypical and doubt ACS.  EKG shows NSR.  Patient is very well-appearing.  Strict return precautions given.  Patient understands and agrees with plan.  Patient vitals stable throughout ED course and discharged in satisfactory condition. I discussed patient case with Dr. Alvino Chapel who guided the patient's management and agrees with plan.   Final Clinical Impressions(s) / ED Diagnoses   Final diagnoses:  Nonspecific chest pain    ED Discharge Orders    None       Frederica Kuster, PA-C 11/14/17 1526    Davonna Belling, MD 11/14/17 2159

## 2017-11-14 NOTE — ED Notes (Signed)
Pt ambulated to bathroom 

## 2017-11-16 ENCOUNTER — Ambulatory Visit (INDEPENDENT_AMBULATORY_CARE_PROVIDER_SITE_OTHER): Payer: 59 | Admitting: Family Medicine

## 2017-11-16 ENCOUNTER — Encounter: Payer: Self-pay | Admitting: Family Medicine

## 2017-11-16 VITALS — BP 102/70 | HR 56 | Temp 97.9°F | Wt 139.8 lb

## 2017-11-16 DIAGNOSIS — E063 Autoimmune thyroiditis: Secondary | ICD-10-CM

## 2017-11-16 DIAGNOSIS — R0789 Other chest pain: Secondary | ICD-10-CM

## 2017-11-16 LAB — T3, FREE: T3, Free: 4.4 pg/mL — ABNORMAL HIGH (ref 2.3–4.2)

## 2017-11-16 LAB — T4, FREE: Free T4: 0.76 ng/dL (ref 0.60–1.60)

## 2017-11-16 LAB — TSH: TSH: 0.3 u[IU]/mL — ABNORMAL LOW (ref 0.35–4.50)

## 2017-11-16 NOTE — Progress Notes (Signed)
Subjective:     Patient ID: Sherry Williams, female   DOB: 28-Feb-1961, 57 y.o.   MRN: [id number]  HPI Patient seen for ER follow-up. She had pulmonary embolus 2017 and was taken off blood thinner in April. She also has history of endometrial cancer with previous hysterectomy. She went to the ER on 6/5  with 3-4 week history of some constant chest tightness across her upper chest right greater than left. She had some radiation to the right shoulder and right neck region. She related some recent increased work stress. Denied any leg pain or swelling. No fevers or chills. No cough. No exertional chest pain. No recent GERD symptoms. No dysphagia.  Patient had labs including troponins which were unremarkable. Chest x-ray no acute findings. CT angiogram of the chest no pulmonary embolus and no pneumonia. Patient wonders if her chest pain may be related to increased work stress. She also thought she may not be getting adequate sleep but is trying to work on that. She's had some general fatigue recently. She has hypothyroidism and needs follow-up thyroid functions. She is on Armour Thyroid  Past Medical History:  Diagnosis Date  . Allergy   . Depression   . DVT (deep venous thrombosis) ([place])    lower extremity   . Endometrial ca ([place]) 03/14/2017  . Family history of adverse reaction to anesthesia    children had N/V   . Hypothyroidism    Hashimoto  . Lichen sclerosus   . MRSA (methicillin resistant staph aureus) culture positive   . Pulmonary embolism ([place]) 01/13/2016   After car trip   Past Surgical History:  Procedure Laterality Date  . MOUTH SURGERY    . ROBOTIC ASSISTED TOTAL HYSTERECTOMY WITH BILATERAL SALPINGO OOPHERECTOMY Bilateral 04/17/2017   Procedure: XI ROBOTIC ASSISTED TOTAL HYSTERECTOMY WITH BILATERAL SALPINGO OOPHORECTOMY WITH SENTINAL LYMPH NODE BIOPSY AND POSSIBLE LYMPHADENECTOMY;  Surgeon: [name], MD;  Location: [facility];  Service: Gynecology;  Laterality: Bilateral;    reports that she has never smoked. She has never used smokeless tobacco. She reports that she drinks alcohol. She reports that she does not use drugs. family history includes Alzheimer's disease in her father; Arthritis in her mother; Breast cancer in her paternal grandmother; Deep vein thrombosis in her sister; Diabetes in her paternal grandfather; Hyperlipidemia in her father; Lung cancer in her maternal grandmother; Pancreatic cancer in her paternal grandfather; Stroke in her father. Allergies  Allergen Reactions  . Bee Venom Anaphylaxis  . Adhesive [Tape] Other (See Comments)    Irritation and red     Review of Systems  Constitutional: Positive for fatigue. Negative for chills and fever.  Respiratory: Negative for cough.   Cardiovascular: Negative for palpitations and leg swelling.  Gastrointestinal: Negative for abdominal pain.  Neurological: Negative for dizziness, syncope and weakness.       Objective:   Physical Exam  Constitutional: She appears well-developed and well-nourished.  HENT:  Mouth/Throat: Oropharynx is clear and moist.  Neck: Neck supple.  Cardiovascular: Normal rate and regular rhythm. Exam reveals no gallop.  Pulmonary/Chest: No respiratory distress. She has no wheezes. She has no rales.  Musculoskeletal: She exhibits no edema.  Lymphadenopathy:    She has no cervical adenopathy.       Assessment:     #1 Recent atypical chest pain. Ruled out for recurrent pulmonary embolus. Suspect muscular. Workup as above unrevealing  #2 hypothyroidism    Plan:     -Recheck labs with TSH, free T4, and free T3 -  Continue regular walking and exercise habits -Continue to get adequate sleep -Follow-up promptly for any exertional chest symptoms or other concerns  Eulas Post MD [facility]

## 2017-11-16 NOTE — Patient Instructions (Signed)
Follow up for any exertional chest pain or increased shortness of breath  Keep up your walking  We will call you with lab results.

## 2017-11-19 ENCOUNTER — Encounter: Payer: Self-pay | Admitting: Family Medicine

## 2017-11-19 DIAGNOSIS — E063 Autoimmune thyroiditis: Secondary | ICD-10-CM

## 2017-11-20 ENCOUNTER — Encounter: Payer: Self-pay | Admitting: Gynecologic Oncology

## 2017-11-20 ENCOUNTER — Encounter: Payer: Self-pay | Admitting: Family Medicine

## 2017-11-21 ENCOUNTER — Telehealth: Payer: Self-pay | Admitting: *Deleted

## 2017-11-21 NOTE — Telephone Encounter (Signed)
Called and scheduled the patient for a follow up appt to see Dr. Gerarda Fraction on July 2nd

## 2017-12-10 NOTE — Progress Notes (Signed)
Progress Note : GYN-ONC Established Patient SURVEILLANCE  Initial consultation at the request of Dr. Cletis Media. Other consulting physician Dr. Burney Gauze (for h/o thrombus)   CC:  No chief complaint on file.   HPI: Sherry Williams 57 y.o. female para 4   . Interval History Since her last visit with Dr. Alycia Rossetti 05/2017 (early postop) she has been doing well.   She had not received a follow-up appointment and called to be seen. No issues, simply needing surveillance visit. Denies bleeding, denies pelvic pain.  . Presenting History (Oncologic Course if applicable) She had been menopausal for a few years. Then had 6-9 months of intermittent occasional bright red bleeding that she - more breakthrough bleeding. She was complicated past medical history of a pulmonary embolism, Hashimoto thyroiditis and lichen sclerosus. In June of this year she began having some infectious lesions on her face. She then noticed some pustules on her vulva and a new one on her left leg. She was showering and the one on her left leg drained and she became quite frustrated. She saw Dr. Boyd Kerbs care and she wanted to be seen by her gynecologist for vulvar lesions. As soon as she reported to Dr. Cletis Media the bleeding she had an appropriate endometrial biopsy. She was placed into a dermatologist and saw Dr. Ubaldo Glassing. She was diagnosed with MRSA of the vulva and placed on antibiotics. Since being switched to doxycycline, those lesions improved.  In 2017 around August before going on for her vacation she was having pain in her left calf that she without with a charley horse. She continue to massage it and exercise through through the discomfort and noticed she began having symptoms in both legs. They had a prolonged drive home and then she experienced left shoulder pain and shortness of breath. She was subsequently diagnosed with a pulmonary embolism and was started on xarelto. She was seen by Dr. Marin Olp and per her report she had  negative testing for any genetic predisposition for the blood clot. She was on medication until April of this year which was discontinued. She was told to take fish oil and stay hydrated. Interestingly her sister also had a DVT. The patient is not sure if she had testing done or not.   04/17/2017 she underwent Total robotic hysterectomy bilateral salpingo-oophorectomy, SLN removal on right and leftt pelvic lymph node dissection.  Pathology: Uterus, cervix, bilateral tubes and ovaries, SLN #1 right external; SLN #2 right pre-sacral; SLN #3 aortic bifurcation, left pelvic lymph nodes to pathology.  Operative findings: Normal uterus, cervix and bilateral adnexa. Normal intra-abdominal survey, successful mapping on right, no mapping on left and side specific node dissection was performed.  Diagnosis 1. Lymph node, sentinel, biopsy, right external - ONE OF ONE LYMPH NODE NEGATIVE FOR CARCINOMA (0/1). 2. Lymph node, sentinel, biopsy, right presacral - ONE OF ONE LYMPH NODE NEGATIVE FOR CARCINOMA (0/1). 3. Lymph node, sentinel, biopsy, aortic bifurcation - BENIGN FIBROADIPOSE TISSUE. 4. Lymph node, biopsy, left pelvic - TWO OF TWO LYMPH NODE NEGATIVE FOR CARCINOMA (0/2). 5. 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 TUMOR INVADES LESS THAN ONE HALF OF THE MYOMETRIUM. SEE ONCOLOGY TABLE. -SEROSA: NO MALIGNANCY IDENTIFIED. - CERVIX: BENIGN SQUAMOUS AND ENDOCERVICAL MUCOSA. NO DYSPLASIA OR MALIGNANCY. - BILATERAL OVARIES: INCLUSION CYSTS. NO MALIGNANCY. - BILATERAL FALLOPIAN TUBES: PARATUBAL CYSTS. NO MALIGNANCY.    Lymph - vascular invasion: Not identified.    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 Coenzyme Q10 (COQ10) 50 g POWD Take 50 g by mouth every 7 (seven) days.    Marland Kitchen OVER THE COUNTER MEDICATION Take 0.5 scoop by mouth See admin instructions. ClearVite - Mix 0.5 scoop in drink once daily  . thyroid (ARMOUR) 60 MG tablet Take 2 tabs 3 days a week (Patient taking differently: Take 60 mg by mouth. Take one tablet 5 times a week)  . thyroid (ARMOUR) 90 MG tablet Take 90 mg 4 days per week (Patient taking differently: Take 90 mg by mouth. Take one tablet 2 times a week)   No facility-administered encounter medications on file as of 12/11/2017.     Allergy:  Allergies  Allergen Reactions  . Bee Venom Anaphylaxis  . Adhesive [Tape] Other (See Comments)    Irritation and red    Social Hx:   ETOH - occasional Tobacco - never Illicit Drug Use - none  Past Surgical Hx:  Past Surgical History:  Procedure Laterality Date  . MOUTH SURGERY    . ROBOTIC ASSISTED TOTAL HYSTERECTOMY WITH BILATERAL SALPINGO OOPHERECTOMY Bilateral 04/17/2017   Procedure: XI ROBOTIC ASSISTED TOTAL HYSTERECTOMY WITH BILATERAL SALPINGO OOPHORECTOMY WITH SENTINAL LYMPH NODE BIOPSY AND POSSIBLE LYMPHADENECTOMY;  Surgeon: [name], MD;  Location: [facility];  Service: Gynecology;  Laterality: Bilateral;    Past Medical Hx:  Past Medical History:  Diagnosis Date  . Allergy   . Depression   . DVT (deep venous thrombosis) ([place])    lower extremity   . Endometrial ca ([place]) 03/14/2017  . Family history of adverse reaction to anesthesia    children had N/V   . Hypothyroidism    Hashimoto  . Lichen sclerosus   . MRSA (methicillin resistant staph aureus) culture positive   . Pulmonary embolism ([place]) 01/13/2016   After car trip     Family Hx:  Family History  Problem Relation Age of Onset  . Stroke Father   . Alzheimer's disease Father   . Hyperlipidemia Father   . Deep vein thrombosis Sister   . Breast cancer Paternal Grandmother        In her 32s, but lived to be very elderly  . Arthritis Mother   . Lung cancer Maternal Grandmother   . Diabetes Paternal Grandfather   .  Pancreatic cancer Paternal Grandfather     Vitals:  Last menstrual period 09/04/2012.  Physical Exam:  ECOG PERFORMANCE STATUS: 0 - Asymptomatic   General :  Well developed, 57 y.o., female in no apparent distress HEENT:  Normocephalic/atraumatic, symmetric, EOMI, eyelids normal Neck:   Supple, no masses.  Lymphatics:  No cervical/ submandibular/ supraclavicular/ infraclavicular/ inguinal adenopathy Respiratory:  Respirations unlabored, no use of accessory muscles CV:   Deferred Breast:  Deferred Musculoskeletal: No CVA tenderness, normal muscle strength. Abdomen:  Soft, non-tender and nondistended. No evidence of hernia. No masses. Extremities:  No lymphedema, no erythema, non-tender. Skin:   Normal inspection Neuro/Psych:  No focal motor deficit, no abnormal mental status. Normal gait. Normal affect. Alert and oriented to person, place, and time  Genito Urinary: Vulva: Normal external female genitalia.  Bladder/urethra: Urethral meatus normal in size and location. No lesions or   masses, well supported bladder Speculum exam: Vagina: No lesion, no discharge, no bleeding. Bimanual exam: Cervix/Uterus/Adnexa: Surgically absent  Adnexa: No masses. Rectovaginal:  Good tone, no masses,  no cul de sac nodularity, no parametrial involvement or nodularity. + hardened stool throughout   Assessment/Plan:  1. Surveillance plan for low risk Stage IA uterine CA as laid out by Dr. Alycia Rossetti  Plan pelvic every 6 months, one of these should be annual with primary Ob/Gyn  Pap smear discussed. Could be done with primary on the next pelvic. Would at most perform annually.   Return in one year for continued Gyn Onc followup 2. Report any bleeding or pain and come to see [place] sooner if need be.  Isabel Caprice, MD 12/10/2017, 6:02 PM   Cc: Delsa Bern, MD (Referring Ob/Gyn) Eulas Post, MD (PCP)

## 2017-12-11 ENCOUNTER — Encounter: Payer: Self-pay | Admitting: Obstetrics

## 2017-12-11 ENCOUNTER — Inpatient Hospital Stay: Payer: 59 | Attending: Obstetrics | Admitting: Obstetrics

## 2017-12-11 VITALS — BP 110/50 | HR 56 | Temp 97.7°F | Resp 20 | Ht 66.0 in | Wt 137.6 lb

## 2017-12-11 DIAGNOSIS — Z90710 Acquired absence of both cervix and uterus: Secondary | ICD-10-CM | POA: Diagnosis not present

## 2017-12-11 DIAGNOSIS — Z90722 Acquired absence of ovaries, bilateral: Secondary | ICD-10-CM | POA: Diagnosis not present

## 2017-12-11 DIAGNOSIS — C541 Malignant neoplasm of endometrium: Secondary | ICD-10-CM | POA: Insufficient documentation

## 2017-12-11 NOTE — Patient Instructions (Addendum)
1. Return in 12 months for pelvic  2. Continue annual visits with your primary Ob/Gyn (next should be +/- Jan 2020); +/- Pap 3. Call if any bleeding or unresolved pelvic pain. 4. Please call our office in April 2020 to schedule an appointment for July 2020.

## 2018-02-15 ENCOUNTER — Encounter: Payer: Self-pay | Admitting: Family Medicine

## 2018-02-19 ENCOUNTER — Ambulatory Visit (INDEPENDENT_AMBULATORY_CARE_PROVIDER_SITE_OTHER): Payer: 59 | Admitting: Family Medicine

## 2018-02-19 ENCOUNTER — Encounter: Payer: Self-pay | Admitting: Family Medicine

## 2018-02-19 VITALS — BP 122/70 | HR 64 | Temp 97.5°F | Wt 141.0 lb

## 2018-02-19 DIAGNOSIS — E063 Autoimmune thyroiditis: Secondary | ICD-10-CM | POA: Diagnosis not present

## 2018-02-19 LAB — T3, FREE: T3, Free: 3.5 pg/mL (ref 2.3–4.2)

## 2018-02-19 LAB — T4, FREE: Free T4: 0.52 ng/dL — ABNORMAL LOW (ref 0.60–1.60)

## 2018-02-19 LAB — TSH: TSH: 0.86 u[IU]/mL (ref 0.35–4.50)

## 2018-02-19 NOTE — Progress Notes (Signed)
  Subjective:     Patient ID: Sherry Williams, female   DOB: 01-Feb-1961, 57 y.o.   MRN: [id number]  HPI Patient is here to discuss thyroid medication. She takes Armour thyroid 90 g 2 days per week and 60 g 5 days per week. She had labs done back in June with slightly low TSH and slightly elevated free T3 but normal free T4. She is exercising regularly. She's gained couple pounds since then. Denies any overt symptoms of hyperthyroidism.  Past Medical History:  Diagnosis Date  . Allergy   . Depression   . DVT (deep venous thrombosis) ([place])    lower extremity   . Endometrial ca ([place]) 03/14/2017  . Family history of adverse reaction to anesthesia    children had N/V   . Hypothyroidism    Hashimoto  . Lichen sclerosus   . MRSA (methicillin resistant staph aureus) culture positive   . Pulmonary embolism ([place]) 01/13/2016   After car trip   Past Surgical History:  Procedure Laterality Date  . MOUTH SURGERY    . ROBOTIC ASSISTED TOTAL HYSTERECTOMY WITH BILATERAL SALPINGO OOPHERECTOMY Bilateral 04/17/2017   Procedure: XI ROBOTIC ASSISTED TOTAL HYSTERECTOMY WITH BILATERAL SALPINGO OOPHORECTOMY WITH SENTINAL LYMPH NODE BIOPSY AND POSSIBLE LYMPHADENECTOMY;  Surgeon: [name], MD;  Location: [facility];  Service: Gynecology;  Laterality: Bilateral;    reports that she has never smoked. She has never used smokeless tobacco. She reports that she drinks alcohol. She reports that she does not use drugs. family history includes Alzheimer's disease in her father; Arthritis in her mother; Breast cancer in her paternal grandmother; Deep vein thrombosis in her sister; Diabetes in her paternal grandfather; Hyperlipidemia in her father; Lung cancer in her maternal grandmother; Pancreatic cancer in her paternal grandfather; Stroke in her father. Allergies  Allergen Reactions  . Bee Venom Anaphylaxis  . Adhesive [Tape] Other (See Comments)    Irritation and red     Review of Systems  Constitutional:  Negative for appetite change and unexpected weight change.  Respiratory: Negative for shortness of breath.   Cardiovascular: Negative for chest pain.       Objective:   Physical Exam  Constitutional: She appears well-developed and well-nourished.  Neck: Neck supple. No thyromegaly present.  Cardiovascular: Normal rate and regular rhythm.  Pulmonary/Chest: Effort normal and breath sounds normal. She has no wheezes. She has no rales.       Assessment:     Hypothyroidism on replacement. Slightly over place by labs back in June    Plan:     -re-check labs today with TSH, free T4, free T3  Eulas Post MD [facility]

## 2018-02-20 ENCOUNTER — Other Ambulatory Visit: Payer: Self-pay | Admitting: Family Medicine

## 2018-02-20 ENCOUNTER — Other Ambulatory Visit: Payer: Self-pay | Admitting: *Deleted

## 2018-02-20 DIAGNOSIS — E039 Hypothyroidism, unspecified: Secondary | ICD-10-CM

## 2018-02-20 MED ORDER — THYROID 90 MG PO TABS: 90.0000 mg | ORAL_TABLET | Freq: Every day | ORAL | 1 refills | Status: DC

## 2018-02-20 MED ORDER — THYROID 60 MG PO TABS: 60.0000 mg | ORAL_TABLET | Freq: Every day | ORAL | 1 refills | Status: DC

## 2018-03-13 DIAGNOSIS — L680 Hirsutism: Secondary | ICD-10-CM | POA: Diagnosis not present

## 2018-03-28 ENCOUNTER — Encounter: Payer: Self-pay | Admitting: Family Medicine

## 2018-03-28 ENCOUNTER — Telehealth: Payer: Self-pay | Admitting: *Deleted

## 2018-03-28 NOTE — Telephone Encounter (Signed)
Copied from [place] [id number]. Topic: General - Other >> Mar 28, 2018 12:20 PM Mcneil, Ja-Kwan wrote: Reason for CRM: Pt states she is the process of moving her parent from [place] to [facility] and she would like to ask Dr Elease Hashimoto if he is willing to accept both her parents as new patients. Pt states during the move to [place] her father was admitted to the ED and now he has a catheter and needs a pcp. Pt requests call back to advise if parents will be accepted. Cb# [phone]

## 2018-03-31 NOTE — Telephone Encounter (Signed)
I have already responded and did agree to accept them as new patients   I will be seeing them early this week.

## 2018-05-03 ENCOUNTER — Encounter: Payer: Self-pay | Admitting: Family Medicine

## 2018-05-03 ENCOUNTER — Ambulatory Visit (INDEPENDENT_AMBULATORY_CARE_PROVIDER_SITE_OTHER): Payer: 59 | Admitting: Family Medicine

## 2018-05-03 VITALS — BP 120/66 | HR 78 | Temp 98.1°F | Resp 16 | Ht 66.0 in | Wt 142.4 lb

## 2018-05-03 DIAGNOSIS — J069 Acute upper respiratory infection, unspecified: Secondary | ICD-10-CM

## 2018-05-03 DIAGNOSIS — R51 Headache: Secondary | ICD-10-CM

## 2018-05-03 DIAGNOSIS — R519 Headache, unspecified: Secondary | ICD-10-CM

## 2018-05-03 DIAGNOSIS — R059 Cough, unspecified: Secondary | ICD-10-CM

## 2018-05-03 DIAGNOSIS — R05 Cough: Secondary | ICD-10-CM | POA: Diagnosis not present

## 2018-05-03 MED ORDER — FLUTICASONE PROPIONATE 50 MCG/ACT NA SUSP: 1.0000 | Freq: Two times a day (BID) | NASAL | 0 refills | Status: DC

## 2018-05-03 MED ORDER — HYDROCODONE-HOMATROPINE 5-1.5 MG/5ML PO SYRP: 5.0000 mL | ORAL_SOLUTION | Freq: Two times a day (BID) | ORAL | 0 refills | Status: DC | PRN

## 2018-05-03 MED ORDER — BENZONATATE 100 MG PO CAPS: 200.0000 mg | ORAL_CAPSULE | Freq: Two times a day (BID) | ORAL | 0 refills | Status: DC | PRN

## 2018-05-03 NOTE — Patient Instructions (Addendum)
A few things to remember from today's visit:   URI, acute - Plan: fluticasone (FLONASE) 50 MCG/ACT nasal spray  Cough - Plan: HYDROcodone-homatropine (HYCODAN) 5-1.5 MG/5ML syrup, benzonatate (TESSALON) 100 MG capsule Today lung auscultation is normal and you do not seem to have a sinus infection.  viral infections are self-limited and we treat each symptom depending of severity.  Over the counter medications as decongestants and cold medications usually help, they need to be taken with caution if there is a history of high blood pressure or palpitations. Tylenol and/or Ibuprofen also helps with most symptoms (headache, muscle aching, fever,etc) Plenty of fluids. Honey helps with cough. Steam inhalations helps with runny nose, nasal congestion, and may prevent sinus infections. Cough and nasal congestion could last a few days and sometimes weeks. Please follow in not any better in 1-2 weeks or if symptoms get worse.    I hope you feel better soon!

## 2018-05-03 NOTE — Progress Notes (Signed)
ACUTE VISIT  HPI:  Chief Complaint  Patient presents with  . Cough    with yellow mucus, sx started Monday  . Chest Congestion  . Nasal Congestion  . Headache    Sherry Williams is a 57 y.o.female here today complaining of 5 days of intermittent upper respiratory symptoms. She thinks she might have a sinus infection.  Productive cough with yellowish sputum, fatigue, nasal congestion, rhinorrhea, and generalized  headache sometimes associated with photophobia. Cough is interfering with sleep. She denies dyspnea or wheezing. Reporting voice changes, she has a "deep voice", fullness and ear sensation. Negative for sore throat.  She is reporting fever, max temp at home this morning 99 F.  According the patient, in her case this is considered fever because her "normal temperature is 96.7"  URI   This is a new problem. The current episode started in the past 7 days. The problem has been unchanged. Associated symptoms include congestion, coughing, headaches, a plugged ear sensation and rhinorrhea. Pertinent negatives include no abdominal pain, chest pain, diarrhea, ear pain, joint pain, joint swelling, nausea, neck pain, rash, sore throat, swollen glands, vomiting or wheezing. She has tried acetaminophen for the symptoms. The treatment provided mild relief.     No Hx of recent travel. No sick contact. No known insect bite.  Hx of allergies: Allergic rhinitis, currently she is on Flonase nasal spray.   Review of Systems  Constitutional: Positive for fatigue. Negative for appetite change and chills.  HENT: Positive for congestion, postnasal drip, rhinorrhea, sinus pressure and voice change. Negative for ear pain, facial swelling and sore throat.   Respiratory: Positive for cough. Negative for shortness of breath and wheezing.   Cardiovascular: Negative for chest pain.  Gastrointestinal: Negative for abdominal pain, diarrhea, nausea and vomiting.  Musculoskeletal:  Negative for gait problem, joint pain, myalgias and neck pain.  Skin: Negative for rash.  Allergic/Immunologic: Positive for environmental allergies.  Neurological: Positive for headaches. Negative for weakness.  Hematological: Negative for adenopathy. Does not bruise/bleed easily.  Psychiatric/Behavioral: Positive for sleep disturbance. Negative for confusion. The patient is nervous/anxious.       Current Outpatient Medications on File Prior to Visit  Medication Sig Dispense Refill  . clobetasol cream (TEMOVATE) 0.05 % clobetasol 0.05 % topical cream    . Coenzyme Q10 (COQ10) 50 g POWD Take 50 g by mouth every 7 (seven) days.     Marland Kitchen OVER THE COUNTER MEDICATION Take 0.5 scoop by mouth See admin instructions. ClearVite - Mix 0.5 scoop in drink once daily    . thyroid (ARMOUR) 60 MG tablet Take 1 tablet (60 mg total) by mouth daily before breakfast. Take one daily 5 days per week 90 tablet 1  . thyroid (ARMOUR) 90 MG tablet Take 1 tablet (90 mg total) by mouth daily. Take one daily two days per week 90 tablet 1  . VANIQA 13.9 % cream APPY TO THE AFFECTED AREAS QD  11   No current facility-administered medications on file prior to visit.      Past Medical History:  Diagnosis Date  . Allergy   . Depression   . DVT (deep venous thrombosis) ([place])    lower extremity   . Endometrial ca ([place]) 03/14/2017  . Family history of adverse reaction to anesthesia    children had N/V   . Hypothyroidism    Hashimoto  . Lichen sclerosus   . MRSA (methicillin resistant staph aureus) culture positive   .  Pulmonary embolism ([place]) 01/13/2016   After car trip   Allergies  Allergen Reactions  . Bee Venom Anaphylaxis  . Adhesive [Tape] Other (See Comments)    Irritation and red    Social History   Socioeconomic History  . Marital status: Married    Spouse name: Not on file  . Number of children: Not on file  . Years of education: Not on file  . Highest education level: Not on file    Occupational History  . Not on file  Social Needs  . Financial resource strain: Not on file  . Food insecurity:    Worry: Not on file    Inability: Not on file  . Transportation needs:    Medical: Not on file    Non-medical: Not on file  Tobacco Use  . Smoking status: Never Smoker  . Smokeless tobacco: Never Used  Substance and Sexual Activity  . Alcohol use: Yes    Comment: occassional  . Drug use: No  . Sexual activity: Yes  Lifestyle  . Physical activity:    Days per week: Not on file    Minutes per session: Not on file  . Stress: Not on file  Relationships  . Social connections:    Talks on phone: Not on file    Gets together: Not on file    Attends religious service: Not on file    Active member of club or organization: Not on file    Attends meetings of clubs or organizations: Not on file    Relationship status: Not on file  Other Topics Concern  . Not on file  Social History Narrative  . Not on file    Vitals:   05/03/18 1458  BP: 120/66  Pulse: 78  Resp: 16  Temp: 98.1 F (36.7 C)  SpO2: 99%   Body mass index is 22.98 kg/m.   Physical Exam  Nursing note and vitals reviewed. Constitutional: She is oriented to person, place, and time. She appears well-developed. She does not appear ill. No distress.  HENT:  Head: Normocephalic and atraumatic.  Right Ear: Tympanic membrane, external ear and ear canal normal.  Left Ear: Tympanic membrane, external ear and ear canal normal.  Nose: Rhinorrhea present. Right sinus exhibits no maxillary sinus tenderness and no frontal sinus tenderness. Left sinus exhibits no maxillary sinus tenderness and no frontal sinus tenderness.  Mouth/Throat: Oropharynx is clear and moist and mucous membranes are normal.  Hypertrophic turbinates. Nasal voice.   Eyes: Conjunctivae are normal.  Cardiovascular: Normal rate and regular rhythm.  No murmur heard. Respiratory: Effort normal and breath sounds normal. No respiratory  distress.  Lymphadenopathy:       Head (right side): No submandibular adenopathy present.       Head (left side): No submandibular adenopathy present.    She has no cervical adenopathy.  Neurological: She is alert and oriented to person, place, and time. She has normal strength. No cranial nerve deficit. Gait normal.  Skin: Skin is warm. No rash noted. No erythema.  Psychiatric: Her mood appears anxious.  Well groomed, good eye contact.      ASSESSMENT AND PLAN:  Sherry Williams was seen today for cough, chest congestion, nasal congestion and headache.  Diagnoses and all orders for this visit:  URI, acute Symptoms suggests a viral etiology, I explained patient that symptomatic treatment is usually recommended in this case, so I do not think abx is needed at this time. Instructed to monitor for signs of  complications, including temp 100 F, clearly instructed about warning signs.  F/U as needed.   -     fluticasone (FLONASE) 50 MCG/ACT nasal spray; Place 1 spray into both nostrils 2 (two) times daily.  Cough Explained that cough and nasal congestion can last a few days and even weeks. Some side effects of hydrocodone discussed, recommend taking at night. During the day she can take benzonatate. I do not think imaging is needed today. Instructed about warning signs. Follow-up with PCP if needed.  -     HYDROcodone-homatropine (HYCODAN) 5-1.5 MG/5ML syrup; Take 5 mLs by mouth every 12 (twelve) hours as needed for cough. -     benzonatate (TESSALON) 100 MG capsule; Take 2 capsules (200 mg total) by mouth 2 (two) times daily as needed for up to 10 days (Amm and noon).   Headache, unspecified headache type Possible etiology discussed,?  Sinus pressure, allergies, or tension headache among some. Neurologic evaluation 2 days normal. Instructed about warning signs. Follow-up as needed.      Kymberli Wiegand G. Martinique, MD  [facility]. [place] office.

## 2018-05-13 ENCOUNTER — Emergency Department (HOSPITAL_COMMUNITY)
Admission: EM | Admit: 2018-05-13 | Discharge: 2018-05-13 | Disposition: A | Discharge status: Home / Self Care | Payer: 59 | Attending: Emergency Medicine | Admitting: Emergency Medicine

## 2018-05-13 ENCOUNTER — Ambulatory Visit: Payer: Self-pay | Admitting: *Deleted

## 2018-05-13 ENCOUNTER — Emergency Department (HOSPITAL_COMMUNITY): Payer: 59

## 2018-05-13 ENCOUNTER — Encounter (HOSPITAL_COMMUNITY): Payer: Self-pay

## 2018-05-13 ENCOUNTER — Encounter: Payer: Self-pay | Admitting: Family Medicine

## 2018-05-13 DIAGNOSIS — R112 Nausea with vomiting, unspecified: Secondary | ICD-10-CM | POA: Diagnosis not present

## 2018-05-13 DIAGNOSIS — Z79899 Other long term (current) drug therapy: Secondary | ICD-10-CM | POA: Diagnosis not present

## 2018-05-13 DIAGNOSIS — E039 Hypothyroidism, unspecified: Secondary | ICD-10-CM | POA: Diagnosis not present

## 2018-05-13 DIAGNOSIS — N202 Calculus of kidney with calculus of ureter: Secondary | ICD-10-CM | POA: Diagnosis not present

## 2018-05-13 DIAGNOSIS — R109 Unspecified abdominal pain: Secondary | ICD-10-CM | POA: Diagnosis not present

## 2018-05-13 DIAGNOSIS — N201 Calculus of ureter: Secondary | ICD-10-CM | POA: Diagnosis not present

## 2018-05-13 DIAGNOSIS — R1032 Left lower quadrant pain: Secondary | ICD-10-CM | POA: Diagnosis present

## 2018-05-13 LAB — CBC WITH DIFFERENTIAL/PLATELET
Abs Immature Granulocytes: 0.07 10*3/uL (ref 0.00–0.07)
Basophils Absolute: 0 10*3/uL (ref 0.0–0.1)
Basophils Relative: 0 %
Eosinophils Absolute: 0 10*3/uL (ref 0.0–0.5)
Eosinophils Relative: 0 %
HCT: 39.2 % (ref 36.0–46.0)
Hemoglobin: 12.8 g/dL (ref 12.0–15.0)
Immature Granulocytes: 1 %
Lymphocytes Relative: 17 %
Lymphs Abs: 1.3 10*3/uL (ref 0.7–4.0)
MCH: 29.8 pg (ref 26.0–34.0)
MCHC: 32.7 g/dL (ref 30.0–36.0)
MCV: 91.4 fL (ref 80.0–100.0)
Monocytes Absolute: 0.4 10*3/uL (ref 0.1–1.0)
Monocytes Relative: 6 %
Neutro Abs: 5.9 10*3/uL (ref 1.7–7.7)
Neutrophils Relative %: 76 %
Platelets: 284 10*3/uL (ref 150–400)
RBC: 4.29 MIL/uL (ref 3.87–5.11)
RDW: 12.2 % (ref 11.5–15.5)
WBC: 7.7 10*3/uL (ref 4.0–10.5)
nRBC: 0 % (ref 0.0–0.2)

## 2018-05-13 LAB — COMPREHENSIVE METABOLIC PANEL
ALT: 17 U/L (ref 0–44)
AST: 20 U/L (ref 15–41)
Albumin: 4.7 g/dL (ref 3.5–5.0)
Alkaline Phosphatase: 66 U/L (ref 38–126)
Anion gap: 11 (ref 5–15)
BUN: 19 mg/dL (ref 6–20)
CO2: 25 mmol/L (ref 22–32)
Calcium: 9.8 mg/dL (ref 8.9–10.3)
Chloride: 105 mmol/L (ref 98–111)
Creatinine, Ser: 0.88 mg/dL (ref 0.44–1.00)
GFR calc Af Amer: >60 mL/min (ref >60)
GFR calc non Af Amer: >60 mL/min (ref >60)
Glucose, Bld: 119 mg/dL — ABNORMAL HIGH (ref 70–99)
Potassium: 3.5 mmol/L (ref 3.5–5.1)
Sodium: 141 mmol/L (ref 135–145)
Total Bilirubin: 0.5 mg/dL (ref 0.3–1.2)
Total Protein: 7.1 g/dL (ref 6.5–8.1)

## 2018-05-13 LAB — URINALYSIS, ROUTINE W REFLEX MICROSCOPIC
Bilirubin Urine: NEGATIVE
Glucose, UA: NEGATIVE mg/dL
Nitrite: NEGATIVE
Specific Gravity, Urine: 1.015 (ref 1.005–1.030)
pH: 7.5 (ref 5.0–8.0)

## 2018-05-13 LAB — URINALYSIS, MICROSCOPIC (REFLEX)
Bacteria, UA: NONE SEEN
RBC / HPF: >50 RBC/hpf (ref 0–5)
Squamous Epithelial / LPF: NONE SEEN (ref 0–5)
WBC, UA: NONE SEEN WBC/hpf (ref 0–5)

## 2018-05-13 LAB — LIPASE, BLOOD: Lipase: 35 U/L (ref 11–51)

## 2018-05-13 MED ORDER — IBUPROFEN 600 MG PO TABS: 600.0000 mg | ORAL_TABLET | Freq: Four times a day (QID) | ORAL | 0 refills | Status: DC | PRN

## 2018-05-13 MED ORDER — KETOROLAC TROMETHAMINE 15 MG/ML IJ SOLN
15.0000 mg | Freq: Once | INTRAMUSCULAR | Status: AC
  Administered 2018-05-13: 15 mg via INTRAVENOUS
  Filled 2018-05-13: qty 1

## 2018-05-13 MED ORDER — FENTANYL CITRATE (PF) 100 MCG/2ML IJ SOLN
50.0000 ug | Freq: Once | INTRAMUSCULAR | Status: AC
  Administered 2018-05-13: 50 ug via INTRAMUSCULAR
  Filled 2018-05-13: qty 2

## 2018-05-13 MED ORDER — OXYCODONE-ACETAMINOPHEN 5-325 MG PO TABS: 1.0000 | ORAL_TABLET | ORAL | 0 refills | Status: DC | PRN

## 2018-05-13 MED ORDER — METOCLOPRAMIDE HCL 5 MG/ML IJ SOLN
10.0000 mg | Freq: Once | INTRAMUSCULAR | Status: AC
  Administered 2018-05-13: 10 mg via INTRAVENOUS
  Filled 2018-05-13: qty 2

## 2018-05-13 MED ORDER — ONDANSETRON 8 MG PO TBDP
8.0000 mg | ORAL_TABLET | Freq: Once | ORAL | Status: AC
  Administered 2018-05-13: 8 mg via ORAL
  Filled 2018-05-13: qty 1

## 2018-05-13 MED ORDER — HYDROMORPHONE HCL 1 MG/ML IJ SOLN
1.0000 mg | Freq: Once | INTRAMUSCULAR | Status: AC
  Administered 2018-05-13: 1 mg via INTRAVENOUS
  Filled 2018-05-13: qty 1

## 2018-05-13 MED ORDER — PROMETHAZINE HCL 25 MG/ML IJ SOLN
12.5000 mg | Freq: Once | INTRAMUSCULAR | Status: AC
  Administered 2018-05-13: 12.5 mg via INTRAVENOUS
  Filled 2018-05-13: qty 1

## 2018-05-13 MED ORDER — TAMSULOSIN HCL 0.4 MG PO CAPS: 0.4000 mg | ORAL_CAPSULE | Freq: Every day | ORAL | 0 refills | Status: DC

## 2018-05-13 MED ORDER — ONDANSETRON 4 MG PO TBDP: 4.0000 mg | ORAL_TABLET | Freq: Three times a day (TID) | ORAL | 0 refills | Status: DC | PRN

## 2018-05-13 NOTE — ED Provider Notes (Signed)
[facility] DEPT Provider Note   CSN: [id number] Arrival date & time: 05/13/18  1541     History   Chief Complaint Chief Complaint  Patient presents with  . Flank Pain    HPI Sherry Williams is a 57 y.o. female who presents with left flank pain. PMH significant for hx of endometrial cancer, DVT/PE. The patient states her pain started acutely at 1PM today after lunch. It is over the left flank. It is constant and radiating to the left groin. She states she has a fever. She's had nausea/vomiting. She's never had this pain before. She denies any urinary symptoms. She states her son died of a benzo overdose and doesn't want any benzos. Past surgical hx significant for hysterectomy. She had a CT of her chest a couple month ago which showed kidney stones on the right side. She is not on blood thinners anymore.  HPI  Past Medical History:  Diagnosis Date  . Allergy   . Depression   . DVT (deep venous thrombosis) ([place])    lower extremity   . Endometrial ca ([place]) 03/14/2017  . Family history of adverse reaction to anesthesia    children had N/V   . Hypothyroidism    Hashimoto  . Lichen sclerosus   . MRSA (methicillin resistant staph aureus) culture positive   . Pulmonary embolism ([place]) 01/13/2016   After car trip    Patient Active Problem List   Diagnosis Date Noted  . Endometrial cancer ([place]) 04/17/2017  . MRSA (methicillin resistant staph aureus) culture positive 03/14/2017  . Lichen sclerosus et atrophicus 03/14/2017  . Pulmonary embolus ([place]) 08/14/2016  . 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 MOUTH SURGERY    . ROBOTIC ASSISTED TOTAL HYSTERECTOMY WITH BILATERAL SALPINGO OOPHERECTOMY Bilateral 04/17/2017   Procedure: XI ROBOTIC ASSISTED TOTAL HYSTERECTOMY WITH BILATERAL SALPINGO OOPHORECTOMY WITH SENTINAL LYMPH NODE BIOPSY AND POSSIBLE LYMPHADENECTOMY;  Surgeon: [name], MD;  Location: [facility];  Service: Gynecology;  Laterality: Bilateral;     OB History   None      Home Medications    Prior to Admission medications   Medication Sig Start Date End Date Taking? Authorizing Provider  benzonatate (TESSALON) 100 MG capsule Take 2 capsules (200 mg total) by mouth 2 (two) times daily as needed for up to 10 days (Amm and noon). 05/03/18 05/13/18  Martinique, Betty G, MD  clobetasol cream (TEMOVATE) 0.05 % clobetasol 0.05 % topical cream    [provider]  Coenzyme Q10 (COQ10) 50 g POWD Take 50 g by mouth every 7 (seven) days.     [provider]  fluticasone (FLONASE) 50 MCG/ACT nasal spray Place 1 spray into both nostrils 2 (two) times daily. 05/03/18 07/02/18  Martinique, Betty G, MD  HYDROcodone-homatropine [facility]) 5-1.5 MG/5ML syrup Take 5 mLs by mouth every 12 (twelve) hours as needed for cough. 05/03/18   Martinique, Betty G, MD  OVER THE COUNTER MEDICATION Take 0.5 scoop by mouth See admin instructions. ClearVite - Mix 0.5 scoop in drink once daily    [provider]  thyroid (ARMOUR) 60 MG tablet Take 1 tablet (60 mg total) by mouth daily before breakfast. Take one daily 5 days per week 02/20/18   Burchette, Alinda Sierras, MD  thyroid (ARMOUR) 90 MG tablet Take 1 tablet (90 mg total) by mouth daily. Take one daily two days per week 02/20/18   Burchette, Alinda Sierras, MD  VANIQA 13.9 % cream APPY TO THE AFFECTED AREAS QD 03/26/18   [provider]    Family History Family History  Problem Relation Age of Onset  . Stroke Father   . Alzheimer's disease Father   . Hyperlipidemia Father   . Deep vein thrombosis Sister   . Breast cancer Paternal Grandmother        In her 46s, but lived to be very elderly  . Arthritis Mother   . Lung cancer Maternal Grandmother   . Diabetes Paternal Grandfather   . Pancreatic cancer Paternal Grandfather     Social History Social History   Tobacco Use  . Smoking status: Never Smoker  . Smokeless  tobacco: Never Used  Substance Use Topics  . Alcohol use: Yes    Comment: occassional  . Drug use: No     Allergies   Bee venom and Adhesive [tape]   Review of Systems Review of Systems  Constitutional: Negative for fever.  Respiratory: Negative for shortness of breath.   Cardiovascular: Negative for chest pain.  Gastrointestinal: Positive for abdominal pain, nausea and vomiting. Negative for diarrhea.  Genitourinary: Positive for flank pain. Negative for difficulty urinating, dysuria, frequency and hematuria.  All other systems reviewed and are negative.    Physical Exam Updated Vital Signs BP 110/64 (BP Location: Right Arm)   Pulse 65   Temp 98.2 F (36.8 C) (Oral)   Resp 20   Ht 5\' 6"  (1.676 m)   Wt 65.8 kg   LMP 09/04/2012   SpO2 100%   BMI 23.40 kg/m   Physical Exam  Constitutional: She is oriented to person, place, and time. She appears well-developed and well-nourished. She appears distressed.  Hunched over on the floor, actively vomiting  HENT:  Head: Normocephalic and atraumatic.  Eyes: Pupils are equal, round, and reactive to light. Conjunctivae are normal. Right eye exhibits no discharge. Left eye exhibits no discharge. No scleral icterus.  Neck: Normal range of motion.  Cardiovascular: Normal rate and regular rhythm.  Pulmonary/Chest: Effort normal and breath sounds normal. No respiratory distress.  Abdominal: Soft. Bowel sounds are normal. She exhibits no distension. There is tenderness.  Neurological: She is alert and oriented to person, place, and time.  Skin: Skin is warm and dry.  Psychiatric: She has a normal mood and affect. Her behavior is normal.  Nursing note and vitals reviewed.    ED Treatments / Results  Labs (all labs ordered are listed, but only abnormal results are displayed) Labs Reviewed  COMPREHENSIVE METABOLIC PANEL - Abnormal; Notable for the following components:      Result Value   Glucose, Bld 119 (*)    All other  components within normal limits  URINALYSIS, ROUTINE W REFLEX MICROSCOPIC - Abnormal; Notable for the following components:   Color, Urine BROWN (*)    APPearance TURBID (*)    Hgb urine dipstick LARGE (*)    Ketones, ur TRACE (*)    Protein, ur TRACE (*)    Leukocytes, UA TRACE (*)    All other components within normal limits  LIPASE, BLOOD  CBC WITH DIFFERENTIAL/PLATELET  URINALYSIS, MICROSCOPIC (REFLEX)    EKG None  Radiology Ct Renal Stone Study  Result Date: 05/13/2018 CLINICAL DATA:  Abdominal and flank pain on the left. EXAM: CT ABDOMEN AND PELVIS WITHOUT CONTRAST TECHNIQUE: Multidetector CT imaging of the abdomen and pelvis was performed following the standard protocol without IV contrast. COMPARISON:  Radiographs of the lumbar spine from 09/18/2012, right upper  quadrant abdominal ultrasound 18 17 FINDINGS: Lower chest: Normal heart size without pericardial effusion or thickening. Atelectasis at the lung bases. Minimal nonspecific pleural thickening medially at the left lung base measuring 6 mm x 2 mm. Hepatobiliary: No focal liver abnormality is seen. No gallstones, gallbladder wall thickening, or biliary dilatation. Pancreas: Unremarkable. No pancreatic ductal dilatation or surrounding inflammatory changes. Spleen: Normal in size without focal abnormality. Adrenals/Urinary Tract: Mild left-sided hydroureteronephrosis secondary to a proximal left ureteral stone just beyond the UPJ measuring 5 x 4 x 8 mm. Bilateral nonobstructing renal calculi are identified, the largest is in the lower pole the right kidney measuring up to 7 mm. No adrenal mass. The urinary bladder is physiologically distended. Stomach/Bowel: Physiologic distention of the stomach. Normal small bowel rotation. Nonobstructed, nondistended bowel. No acute inflammatory process. Normal appearing appendix. Vascular/Lymphatic: Aortoiliac atherosclerosis.  No lymphadenopathy. Reproductive: Status post hysterectomy. No adnexal  masses. Other: Small periumbilical fat containing hernia. No free air nor free fluid. Musculoskeletal: Moderate degenerative disc disease with discogenic sclerosis of the endplates at J6-2 with moderate disc flattening at L2-3. No acute osseous abnormality. 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 IMPRESSION: 1. Proximal left ureteral stone measuring 5 x 4 x 8 mm (AP by transverse by craniocaudad) causing mild left-sided hydroureteronephrosis. Nonobstructing bilateral renal calculi are otherwise noted, the largest in the lower pole the right kidney measuring up to 7 mm. 2. 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 3. Minimal pleural thickening medially at the left lung base, nonspecific but may reflect postinfectious or inflammatory change. Electronically Signed   By: Ashley Royalty M.D.   On: 05/13/2018 19:29    Procedures Procedures (including critical care time)  Medications Ordered in ED Medications - No data to display   Initial Impression / Assessment and Plan / ED Course  I have reviewed the triage vital signs and the nursing notes.  Pertinent labs & imaging results that were available during my care of the patient were reviewed by me and considered in my medical decision making (see chart for details).  46:49 PM 57 year old female presents with acute onset of left flank pain. Her vitals are normal. On initial exam she is vomiting and curled up on the floor. Presentation seems consistent with kidney stone. She was ordered odt Zofran and IM Fentanyl.  5:00 PM On recheck she is in less distress but still uncomfortable. She now has an IV. Will order Dilaudid and Reglan.  7:00 PM Rechecked pt again and she feels better. CBC and CMP are normal. UA is not consistent with infection. She notes there was blood in the urine when she went.  7:30 PM CT renal shows 5x4x20mm stone in the proximal left ureter.  8:15 PM Pt is vomiting again. Phenergan and Toradol  ordered.  Pt is feeling better. Pain is resolved. She tolerated PO. Rx for Ibuprofen, Percocet, Zofran, Flomax given. Close Urology f/u recommended. Return precautions given.  Final Clinical Impressions(s) / ED Diagnoses   Final diagnoses:  Calculus of kidney with calculus of ureter  Left flank pain    ED Discharge Orders    None       Recardo Evangelist, PA-C 05/13/18 2310    Nat Christen, MD 05/14/18 [phone]

## 2018-05-13 NOTE — Discharge Instructions (Signed)
Ibuprofen 600mg  - Take for pain and inflammation. Take with food three times daily Take narcotic pain medicine as needed. Do not drink alcohol or drive while taking this medicine Take Zofran for nausea Take Flomax to help the stone pass Please call Urology for follow up appointment Return if symptoms are worsening

## 2018-05-13 NOTE — Telephone Encounter (Signed)
Pt called stating that she is having abdominal pain on her left side above her belly button for app 15-30 min; she says that it is severe and she is doubled over; she says that it is sharp and crampy; recommendations made per nurse triage protocol; the pt would like to personally speak with Dr Elease Hashimoto instead of proceeding to the ED; spoke with [facility] and she explained that Dr Elease Hashimoto is in clinic and she is not sure when he can call the pt back; she requests that this information be sent to office; explained to pt and she verbalized understanding; the pt's best contact number is [phone]; will route to office for notification.        Reason for Disposition . Patient sounds very sick or weak to the triager  Answer Assessment - Initial Assessment Questions 1. LOCATION: "Where does it hurt?"      Left side of abdomin; above bell button 2. RADIATION: "Does the pain shoot anywhere else?" (e.g., chest, back)     Wraps around to back but not painful  3. ONSET: "When did the pain begin?" (e.g., minutes, hours or days ago)      15 min ago 4. SUDDEN: "Gradual or sudden onset?"     suden 5. PATTERN "Does the pain come and go, or is it constant?"    - If constant: "Is it getting better, staying the same, or worsening?"      (Note: Constant means the pain never goes away completely; most serious pain is constant and it progresses)     - If intermittent: "How long does it last?" "Do you have pain now?"     (Note: Intermittent means the pain goes away completely between bouts)     constant 6. SEVERITY: "How bad is the pain?"  (e.g., Scale 1-10; mild, moderate, or severe)   - MILD (1-3): doesn't interfere with normal activities, abdomen soft and not tender to touch    - MODERATE (4-7): interferes with normal activities or awakens from sleep, tender to touch    - SEVERE (8-10): excruciating pain, doubled over, unable to do any normal activities      severe 7. RECURRENT SYMPTOM: "Have you ever had  this type of abdominal pain before?" If so, ask: "When was the last time?" and "What happened that time?"      no 8. CAUSE: "What do you think is causing the abdominal pain?"     Not sure 9. RELIEVING/AGGRAVATING FACTORS: "What makes it better or worse?" (e.g., movement, antacids, bowel movement)     Laying down did not hel[ 10. OTHER SYMPTOMS: "Has there been any vomiting, diarrhea, constipation, or urine problems?"       No; LBM 05/13/18 11. PREGNANCY: "Is there any chance you are pregnant?" "When was your last menstrual period?"       No hysterectomy  Protocols used: ABDOMINAL PAIN - [facility]

## 2018-05-13 NOTE — ED Triage Notes (Addendum)
Patient c/o left flank pain radiating to left abdomen that started around 1pm today. Sharp/achy constant 10/10 pain.   Patient was told in July she has kidneys stones from radiology. She was not here for that.    A/Ox4 Ambulatory in triage.  Patient found in bathroom calling for help. Patient hovering over toilet vomiting. VS obtained and stable.

## 2018-05-13 NOTE — Telephone Encounter (Signed)
Please see message. °

## 2018-05-13 NOTE — Telephone Encounter (Signed)
I received this note after she had already proceeded to going to the ER.

## 2018-05-14 ENCOUNTER — Telehealth: Payer: Self-pay | Admitting: Family Medicine

## 2018-05-14 DIAGNOSIS — N200 Calculus of kidney: Secondary | ICD-10-CM

## 2018-05-14 NOTE — Telephone Encounter (Signed)
Pt had severe flank pain yesterday.  Went to ED.  8 MM stone left proximal ureter.  She is doing better this AM.  Will get in to see Urology.

## 2018-05-15 DIAGNOSIS — N201 Calculus of ureter: Secondary | ICD-10-CM | POA: Diagnosis not present

## 2018-05-17 ENCOUNTER — Observation Stay (HOSPITAL_COMMUNITY)
Admission: AD | Admit: 2018-05-17 | Discharge: 2018-05-18 | Disposition: A | Discharge status: Home / Self Care | Payer: 59 | Source: Ambulatory Visit | Attending: Urology | Admitting: Urology

## 2018-05-17 ENCOUNTER — Encounter (HOSPITAL_COMMUNITY): Payer: Self-pay | Admitting: *Deleted

## 2018-05-17 ENCOUNTER — Other Ambulatory Visit: Payer: Self-pay

## 2018-05-17 DIAGNOSIS — Z8614 Personal history of Methicillin resistant Staphylococcus aureus infection: Secondary | ICD-10-CM | POA: Insufficient documentation

## 2018-05-17 DIAGNOSIS — N201 Calculus of ureter: Principal | ICD-10-CM | POA: Insufficient documentation

## 2018-05-17 DIAGNOSIS — R109 Unspecified abdominal pain: Secondary | ICD-10-CM | POA: Diagnosis present

## 2018-05-17 DIAGNOSIS — E039 Hypothyroidism, unspecified: Secondary | ICD-10-CM | POA: Insufficient documentation

## 2018-05-17 DIAGNOSIS — Z8542 Personal history of malignant neoplasm of other parts of uterus: Secondary | ICD-10-CM | POA: Diagnosis not present

## 2018-05-17 DIAGNOSIS — N200 Calculus of kidney: Secondary | ICD-10-CM

## 2018-05-17 DIAGNOSIS — Z86711 Personal history of pulmonary embolism: Secondary | ICD-10-CM | POA: Diagnosis not present

## 2018-05-17 DIAGNOSIS — Z79890 Hormone replacement therapy (postmenopausal): Secondary | ICD-10-CM | POA: Insufficient documentation

## 2018-05-17 MED ORDER — HYDRALAZINE HCL 20 MG/ML IJ SOLN: 5.0000 mg | INTRAMUSCULAR | Status: DC | PRN

## 2018-05-17 MED ORDER — ZOLPIDEM TARTRATE 5 MG PO TABS: 5.0000 mg | ORAL_TABLET | Freq: Every evening | ORAL | Status: DC | PRN

## 2018-05-17 MED ORDER — ONDANSETRON HCL 4 MG/2ML IJ SOLN: 4.0000 mg | Freq: Four times a day (QID) | INTRAMUSCULAR | Status: DC | PRN

## 2018-05-17 MED ORDER — OXYCODONE HCL 5 MG PO TABS: 5.0000 mg | ORAL_TABLET | ORAL | Status: DC | PRN

## 2018-05-17 MED ORDER — ACETAMINOPHEN 10 MG/ML IV SOLN
1000.0000 mg | Freq: Four times a day (QID) | INTRAVENOUS | Status: DC
  Administered 2018-05-17 – 2018-05-18 (×3): 1000 mg via INTRAVENOUS
  Filled 2018-05-17 (×3): qty 100

## 2018-05-17 MED ORDER — SODIUM CHLORIDE 0.45 % IV SOLN
INTRAVENOUS | Status: DC
  Administered 2018-05-17 – 2018-05-18 (×2): via INTRAVENOUS

## 2018-05-17 MED ORDER — PANTOPRAZOLE SODIUM 40 MG IV SOLR: 40.0000 mg | INTRAVENOUS | Status: DC

## 2018-05-17 NOTE — H&P (View-Only) (Signed)
Worked in urgently for left flank pain, nausea and vomiting. Known left mid ureteral stone, seen 2 days ago. No fevers recently, no chills. Culture was negative that returned today. She was sent home with sulfa at her 1st visit which she did not take.     ALLERGIES: Adhesive Bee Venom    MEDICATIONS: Bactrim Ds 800 mg-160 mg tablet 1 tablet PO Q 12 H  Armour Thyroid     GU PSH: No GU PSH      PSH Notes: Hysterectomy in 2018.   NON-GU PSH: No Non-GU PSH    GU PMH: Ureteral calculus - 05/15/2018      PMH Notes: Hx of uterine cancer. Hx of Hypothyroidism.   NON-GU PMH: Heart disease, unspecified Pulmonary Embolism, History    FAMILY HISTORY: 2 daughters - Daughter 2 sons - Son nephrolithiasis - Mother Prostate Cancer - Father   SOCIAL HISTORY: Marital Status: Married Preferred Language: English; Ethnicity: Not Hispanic Or Latino; Race: White Current Smoking Status: Patient has never smoked.   Tobacco Use Assessment Completed: Used Tobacco in last 30 days? Types of alcohol consumed: Wine. Social Drinker.  Drinks 1 caffeinated drink per day. Patient's occupation Brewing technologist.    REVIEW OF SYSTEMS:    GU Review Female:   Patient denies frequent urination, hard to postpone urination, burning /pain with urination, get up at night to urinate, leakage of urine, stream starts and stops, trouble starting your stream, have to strain to urinate, and being pregnant.  Gastrointestinal (Upper):   Patient reports nausea and vomiting. Patient denies indigestion/ heartburn.  Gastrointestinal (Lower):   Patient denies constipation and diarrhea.  Constitutional:   Patient denies fever, night sweats, weight loss, and fatigue.  Skin:   Patient denies skin rash/ lesion and itching.  Eyes:   Patient denies blurred vision and double vision.  Ears/ Nose/ Throat:   Patient denies sore throat and sinus problems.  Hematologic/Lymphatic:   Patient denies swollen glands and easy bruising.   Cardiovascular:   Patient denies leg swelling and chest pains.  Respiratory:   Patient denies cough and shortness of breath.  Endocrine:   Patient denies excessive thirst.  Musculoskeletal:   Patient reports back pain. Patient denies joint pain.  Neurological:   Patient denies headaches and dizziness.  Psychologic:   Patient denies depression and anxiety.   VITAL SIGNS:      05/17/2018 04:40 PM  BP 137/85 mmHg  Pulse 71 /min  Temperature 97.9 F / 36.6 C   MULTI-SYSTEM PHYSICAL EXAMINATION:    Constitutional: She is in moderate distress/pain, has nausea and vomiting.  Neck: Neck symmetrical, not swollen. Normal tracheal position.  Respiratory: No labored breathing, no use of accessory muscles.   Lymphatic: No enlargement of neck, axillae, groin.  Skin: No paleness, no jaundice, no cyanosis. No lesion, no ulcer, no rash.  Neurologic / Psychiatric: Oriented to time, oriented to place, oriented to person. No depression, no anxiety, no agitation.  Eyes: Normal conjunctivae. Normal eyelids.  Ears, Nose, Mouth, and Throat: Left ear no scars, no lesions, no masses. Right ear no scars, no lesions, no masses. Nose no scars, no lesions, no masses. Normal hearing. Normal lips.  Musculoskeletal: Normal gait and station of head and neck.     PAST DATA REVIEWED:  Source Of History:  Patient  Urine Test Review:   Urinalysis   PROCEDURES:         KUB - 61607  A single view of the abdomen is obtained. She has  not progression of her left mid ureteral stone. Bowel gas pattern is normal as are bony structures.               Ketoralac 60mg  - [id number], [id number] Area was prepped with alcohol. Medication was injected. Pt tolerated well.   Qty: 60 Adm. By: Alroy Bailiff  Unit: mg Lot No [id number]  Route: IM Exp. Date 05/12/2019  Freq: None Mfgr.:   Site: Left Buttock   ASSESSMENT:      ICD-10 Details  1 GU:   Ureteral calculus - N20.1 Left, Worsening - Worsening pain with nausea and vomiting with  left mid ureteral stone that is not progressing   PLAN:           Orders X-Rays: KUB          Schedule         Document Letter(s):  Created for Patient: Clinical Summary         Notes:   1. She was given Toradol 60 mg IM today   2. I will set up an admission for her for pain management, hydration and probable stone management tomorrow

## 2018-05-17 NOTE — H&P (Signed)
Worked in urgently for left flank pain, nausea and vomiting. Known left mid ureteral stone, seen 2 days ago. No fevers recently, no chills. Culture was negative that returned today. She was sent home with sulfa at her 1st visit which she did not take.     ALLERGIES: Adhesive Bee Venom    MEDICATIONS: Bactrim Ds 800 mg-160 mg tablet 1 tablet PO Q 12 H  Armour Thyroid     GU PSH: No GU PSH      PSH Notes: Hysterectomy in 2018.   NON-GU PSH: No Non-GU PSH    GU PMH: Ureteral calculus - 05/15/2018      PMH Notes: Hx of uterine cancer. Hx of Hypothyroidism.   NON-GU PMH: Heart disease, unspecified Pulmonary Embolism, History    FAMILY HISTORY: 2 daughters - Daughter 2 sons - Son nephrolithiasis - Mother Prostate Cancer - Father   SOCIAL HISTORY: Marital Status: Married Preferred Language: English; Ethnicity: Not Hispanic Or Latino; Race: White Current Smoking Status: Patient has never smoked.   Tobacco Use Assessment Completed: Used Tobacco in last 30 days? Types of alcohol consumed: Wine. Social Drinker.  Drinks 1 caffeinated drink per day. Patient's occupation Brewing technologist.    REVIEW OF SYSTEMS:    GU Review Female:   Patient denies frequent urination, hard to postpone urination, burning /pain with urination, get up at night to urinate, leakage of urine, stream starts and stops, trouble starting your stream, have to strain to urinate, and being pregnant.  Gastrointestinal (Upper):   Patient reports nausea and vomiting. Patient denies indigestion/ heartburn.  Gastrointestinal (Lower):   Patient denies constipation and diarrhea.  Constitutional:   Patient denies fever, night sweats, weight loss, and fatigue.  Skin:   Patient denies skin rash/ lesion and itching.  Eyes:   Patient denies blurred vision and double vision.  Ears/ Nose/ Throat:   Patient denies sore throat and sinus problems.  Hematologic/Lymphatic:   Patient denies swollen glands and easy bruising.   Cardiovascular:   Patient denies leg swelling and chest pains.  Respiratory:   Patient denies cough and shortness of breath.  Endocrine:   Patient denies excessive thirst.  Musculoskeletal:   Patient reports back pain. Patient denies joint pain.  Neurological:   Patient denies headaches and dizziness.  Psychologic:   Patient denies depression and anxiety.   VITAL SIGNS:      05/17/2018 04:40 PM  BP 137/85 mmHg  Pulse 71 /min  Temperature 97.9 F / 36.6 C   MULTI-SYSTEM PHYSICAL EXAMINATION:    Constitutional: She is in moderate distress/pain, has nausea and vomiting.  Neck: Neck symmetrical, not swollen. Normal tracheal position.  Respiratory: No labored breathing, no use of accessory muscles.   Lymphatic: No enlargement of neck, axillae, groin.  Skin: No paleness, no jaundice, no cyanosis. No lesion, no ulcer, no rash.  Neurologic / Psychiatric: Oriented to time, oriented to place, oriented to person. No depression, no anxiety, no agitation.  Eyes: Normal conjunctivae. Normal eyelids.  Ears, Nose, Mouth, and Throat: Left ear no scars, no lesions, no masses. Right ear no scars, no lesions, no masses. Nose no scars, no lesions, no masses. Normal hearing. Normal lips.  Musculoskeletal: Normal gait and station of head and neck.     PAST DATA REVIEWED:  Source Of History:  Patient  Urine Test Review:   Urinalysis   PROCEDURES:         KUB - [id number]  A single view of the abdomen is obtained. She has  not progression of her left mid ureteral stone. Bowel gas pattern is normal as are bony structures.               Ketoralac 60mg  - [id number], [id number] Area was prepped with alcohol. Medication was injected. Pt tolerated well.   Qty: 60 Adm. By: Alroy Bailiff  Unit: mg Lot No [id number]  Route: IM Exp. Date 05/12/2019  Freq: None Mfgr.:   Site: Left Buttock   ASSESSMENT:      ICD-10 Details  1 GU:   Ureteral calculus - N20.1 Left, Worsening - Worsening pain with nausea and vomiting with  left mid ureteral stone that is not progressing   PLAN:           Orders X-Rays: KUB          Schedule         Document Letter(s):  Created for Patient: Clinical Summary         Notes:   1. She was given Toradol 60 mg IM today   2. I will set up an admission for her for pain management, hydration and probable stone management tomorrow

## 2018-05-18 ENCOUNTER — Observation Stay (HOSPITAL_COMMUNITY): Payer: 59 | Admitting: Certified Registered"

## 2018-05-18 ENCOUNTER — Encounter (HOSPITAL_COMMUNITY)
Admission: AD | Disposition: A | Discharge status: Home / Self Care | Payer: Self-pay | Source: Ambulatory Visit | Attending: Urology

## 2018-05-18 ENCOUNTER — Observation Stay (HOSPITAL_COMMUNITY): Payer: 59

## 2018-05-18 ENCOUNTER — Inpatient Hospital Stay: Admit: 2018-05-18 | Payer: 59 | Admitting: Urology

## 2018-05-18 DIAGNOSIS — Z86711 Personal history of pulmonary embolism: Secondary | ICD-10-CM | POA: Diagnosis not present

## 2018-05-18 DIAGNOSIS — N201 Calculus of ureter: Secondary | ICD-10-CM | POA: Diagnosis not present

## 2018-05-18 DIAGNOSIS — Z8542 Personal history of malignant neoplasm of other parts of uterus: Secondary | ICD-10-CM | POA: Diagnosis not present

## 2018-05-18 HISTORY — PX: CYSTOSCOPY WITH RETROGRADE PYELOGRAM, URETEROSCOPY AND STENT PLACEMENT: SHX5789

## 2018-05-18 HISTORY — PX: HOLMIUM LASER APPLICATION: SHX5852

## 2018-05-18 SURGERY — CYSTOURETEROSCOPY, WITH RETROGRADE PYELOGRAM AND STENT INSERTION
Anesthesia: General | Site: Ureter | Laterality: Left

## 2018-05-18 MED ORDER — MIDAZOLAM HCL 2 MG/2ML IJ SOLN
INTRAMUSCULAR | Status: DC | PRN
  Administered 2018-05-18: 1 mg via INTRAVENOUS

## 2018-05-18 MED ORDER — SODIUM CHLORIDE 0.9 % IR SOLN
Status: DC | PRN
  Administered 2018-05-18: 3000 mL
  Administered 2018-05-18: 1000 mL

## 2018-05-18 MED ORDER — 0.9 % SODIUM CHLORIDE (POUR BTL) OPTIME
TOPICAL | Status: DC | PRN
  Administered 2018-05-18: 1000 mL

## 2018-05-18 MED ORDER — PROPOFOL 10 MG/ML IV BOLUS
INTRAVENOUS | Status: AC
  Filled 2018-05-18: qty 20

## 2018-05-18 MED ORDER — DEXAMETHASONE SODIUM PHOSPHATE 10 MG/ML IJ SOLN
INTRAMUSCULAR | Status: AC
  Filled 2018-05-18: qty 1

## 2018-05-18 MED ORDER — SCOPOLAMINE 1 MG/3DAYS TD PT72
MEDICATED_PATCH | TRANSDERMAL | Status: DC | PRN
  Administered 2018-05-18: 1 via TRANSDERMAL

## 2018-05-18 MED ORDER — CEFAZOLIN SODIUM-DEXTROSE 2-4 GM/100ML-% IV SOLN
INTRAVENOUS | Status: AC
  Filled 2018-05-18: qty 100

## 2018-05-18 MED ORDER — BELLADONNA ALKALOIDS-OPIUM 16.2-60 MG RE SUPP
RECTAL | Status: DC | PRN
  Administered 2018-05-18: 1 via RECTAL

## 2018-05-18 MED ORDER — PHENAZOPYRIDINE HCL 200 MG PO TABS: 200.0000 mg | ORAL_TABLET | Freq: Three times a day (TID) | ORAL | 0 refills | Status: DC | PRN

## 2018-05-18 MED ORDER — MIDAZOLAM HCL 2 MG/2ML IJ SOLN
INTRAMUSCULAR | Status: AC
  Filled 2018-05-18: qty 2

## 2018-05-18 MED ORDER — ROCURONIUM BROMIDE 10 MG/ML (PF) SYRINGE
PREFILLED_SYRINGE | INTRAVENOUS | Status: AC
  Filled 2018-05-18: qty 10

## 2018-05-18 MED ORDER — PROMETHAZINE HCL 25 MG/ML IJ SOLN: 6.2500 mg | INTRAMUSCULAR | Status: DC | PRN

## 2018-05-18 MED ORDER — HYDROMORPHONE HCL 1 MG/ML IJ SOLN: 0.2500 mg | INTRAMUSCULAR | Status: DC | PRN

## 2018-05-18 MED ORDER — KETOROLAC TROMETHAMINE 30 MG/ML IJ SOLN
INTRAMUSCULAR | Status: AC
  Filled 2018-05-18: qty 1

## 2018-05-18 MED ORDER — KETOROLAC TROMETHAMINE 30 MG/ML IJ SOLN
INTRAMUSCULAR | Status: DC | PRN
  Administered 2018-05-18: 30 mg via INTRAVENOUS

## 2018-05-18 MED ORDER — LIDOCAINE 2% (20 MG/ML) 5 ML SYRINGE
INTRAMUSCULAR | Status: AC
  Filled 2018-05-18: qty 5

## 2018-05-18 MED ORDER — DEXAMETHASONE SODIUM PHOSPHATE 10 MG/ML IJ SOLN
INTRAMUSCULAR | Status: DC | PRN
  Administered 2018-05-18: 4 mg via INTRAVENOUS

## 2018-05-18 MED ORDER — FENTANYL CITRATE (PF) 100 MCG/2ML IJ SOLN
INTRAMUSCULAR | Status: AC
  Filled 2018-05-18: qty 2

## 2018-05-18 MED ORDER — CIPROFLOXACIN HCL 500 MG PO TABS: 500.0000 mg | ORAL_TABLET | Freq: Once | ORAL | 0 refills | Status: AC

## 2018-05-18 MED ORDER — CEFAZOLIN SODIUM-DEXTROSE 2-3 GM-%(50ML) IV SOLR
INTRAVENOUS | Status: DC | PRN
  Administered 2018-05-18: 2 g via INTRAVENOUS

## 2018-05-18 MED ORDER — IOHEXOL 300 MG/ML  SOLN
INTRAMUSCULAR | Status: DC | PRN
  Administered 2018-05-18: 10 mL via URETHRAL

## 2018-05-18 MED ORDER — SUCCINYLCHOLINE CHLORIDE 200 MG/10ML IV SOSY
PREFILLED_SYRINGE | INTRAVENOUS | Status: AC
  Filled 2018-05-18: qty 10

## 2018-05-18 MED ORDER — EPHEDRINE SULFATE-NACL 50-0.9 MG/10ML-% IV SOSY
PREFILLED_SYRINGE | INTRAVENOUS | Status: DC | PRN
  Administered 2018-05-18 (×2): 5 mg via INTRAVENOUS

## 2018-05-18 MED ORDER — ONDANSETRON HCL 4 MG/2ML IJ SOLN
INTRAMUSCULAR | Status: DC | PRN
  Administered 2018-05-18: 4 mg via INTRAVENOUS

## 2018-05-18 MED ORDER — LIDOCAINE 2% (20 MG/ML) 5 ML SYRINGE
INTRAMUSCULAR | Status: DC | PRN
  Administered 2018-05-18: 60 mg via INTRAVENOUS

## 2018-05-18 MED ORDER — BELLADONNA ALKALOIDS-OPIUM 16.2-30 MG RE SUPP
RECTAL | Status: AC
  Filled 2018-05-18: qty 1

## 2018-05-18 MED ORDER — SCOPOLAMINE 1 MG/3DAYS TD PT72
MEDICATED_PATCH | TRANSDERMAL | Status: AC
  Administered 2018-05-18: 12:00:00
  Filled 2018-05-18: qty 1

## 2018-05-18 MED ORDER — LACTATED RINGERS IV SOLN
INTRAVENOUS | Status: DC | PRN
  Administered 2018-05-18: 10:00:00 via INTRAVENOUS

## 2018-05-18 MED ORDER — PROPOFOL 10 MG/ML IV BOLUS
INTRAVENOUS | Status: DC | PRN
  Administered 2018-05-18: 150 mg via INTRAVENOUS

## 2018-05-18 MED ORDER — OXYCODONE-ACETAMINOPHEN 5-325 MG PO TABS: 1.0000 | ORAL_TABLET | ORAL | Status: DC | PRN

## 2018-05-18 MED ORDER — ONDANSETRON HCL 4 MG/2ML IJ SOLN
INTRAMUSCULAR | Status: AC
  Filled 2018-05-18: qty 2

## 2018-05-18 MED ORDER — TAMSULOSIN HCL 0.4 MG PO CAPS
0.4000 mg | ORAL_CAPSULE | Freq: Every day | ORAL | Status: DC
  Administered 2018-05-18: 0.4 mg via ORAL
  Filled 2018-05-18: qty 1

## 2018-05-18 MED ORDER — FENTANYL CITRATE (PF) 100 MCG/2ML IJ SOLN
INTRAMUSCULAR | Status: DC | PRN
  Administered 2018-05-18: 50 ug via INTRAVENOUS

## 2018-05-18 MED ORDER — MORPHINE SULFATE (PF) 4 MG/ML IV SOLN: 2.0000 mg | INTRAVENOUS | Status: DC | PRN

## 2018-05-18 SURGICAL SUPPLY — 25 items
BAG URO CATCHER STRL LF (MISCELLANEOUS) ×2 IMPLANT
BASKET ZERO TIP NITINOL 2.4FR (BASKET) IMPLANT
BSKT STON RTRVL ZERO TP 2.4FR (BASKET)
CATH URET 5FR 28IN OPEN ENDED (CATHETERS) ×2 IMPLANT
CLOTH BEACON ORANGE TIMEOUT ST (SAFETY) ×1 IMPLANT
COVER SURGICAL LIGHT HANDLE (MISCELLANEOUS) ×1 IMPLANT
COVER WAND RF STERILE (DRAPES) IMPLANT
EXTRACTOR STONE 1.7FRX115CM (UROLOGICAL SUPPLIES) IMPLANT
EXTRACTOR STONE NITINOL NGAGE (UROLOGICAL SUPPLIES) ×1 IMPLANT
FIBER LASER FLEXIVA 1000 (UROLOGICAL SUPPLIES) IMPLANT
FIBER LASER FLEXIVA 365 (UROLOGICAL SUPPLIES) ×1 IMPLANT
FIBER LASER FLEXIVA 550 (UROLOGICAL SUPPLIES) IMPLANT
FIBER LASER TRAC TIP (UROLOGICAL SUPPLIES) IMPLANT
GLOVE BIOGEL M STRL SZ7.5 (GLOVE) ×2 IMPLANT
GOWN STRL REUS W/TWL XL LVL3 (GOWN DISPOSABLE) ×3 IMPLANT
GUIDEWIRE ANG ZIPWIRE 038X150 (WIRE) IMPLANT
GUIDEWIRE STR DUAL SENSOR (WIRE) ×3 IMPLANT
MANIFOLD NEPTUNE II (INSTRUMENTS) ×2 IMPLANT
PACK CYSTO (CUSTOM PROCEDURE TRAY) ×2 IMPLANT
SHEATH URETERAL 12FRX28CM (UROLOGICAL SUPPLIES) IMPLANT
SHEATH URETERAL 12FRX35CM (MISCELLANEOUS) ×1 IMPLANT
STENT URET 6FRX24 CONTOUR (STENTS) ×1 IMPLANT
TUBING CONNECTING 10 (TUBING) ×2 IMPLANT
TUBING UROLOGY SET (TUBING) ×2 IMPLANT
WIRE COONS/BENSON .038X145CM (WIRE) IMPLANT

## 2018-05-18 NOTE — Anesthesia Procedure Notes (Signed)
Procedure Name: LMA Insertion Date/Time: 05/18/2018 9:58 AM Performed by: Niel Hummer, CRNA Pre-anesthesia Checklist: Patient identified, Emergency Drugs available, Suction available and Patient being monitored Patient Re-evaluated:Patient Re-evaluated prior to induction Oxygen Delivery Method: Circle system utilized Preoxygenation: Pre-oxygenation with 100% oxygen Induction Type: IV induction Ventilation: Mask ventilation without difficulty LMA: LMA inserted LMA Size: 4.0 Dental Injury: Teeth and Oropharynx as per pre-operative assessment

## 2018-05-18 NOTE — Discharge Instructions (Signed)
DISCHARGE INSTRUCTIONS FOR KIDNEY STONE/URETERAL STENT   MEDICATIONS:  1.  Resume all your other meds from home - except do not take any extra narcotic pain meds that you may have at home.  2. Pyridium is to help with the burning/stinging when you urinate. 3. Take Cipro one hour prior to removal of your stent.  4. Try controlling pain with Ibuprofen and/or Tylenol for pain before using oxycodone prescribed by Emergency Room.  ACTIVITY:  1. No strenuous activity x 1week  2. No driving while on narcotic pain medications  3. Drink plenty of water  4. Continue to walk at home - you can still get blood clots when you are at home, so keep active, but don't over do it.  5. May return to work/school tomorrow or when you feel ready   BATHING:  1. You can shower and we recommend daily showers  2. You have a string coming from your urethra: The stent string is attached to your ureteral stent. Do not pull on this.   SIGNS/SYMPTOMS TO CALL:  Please call us if you have a fever greater than 101.5, uncontrolled nausea/vomiting, uncontrolled pain, dizziness, unable to urinate, bloody urine, chest pain, shortness of breath, leg swelling, leg pain, redness around wound, drainage from wound, or any other concerns or questions.   You can reach [place] at [phone].   FOLLOW-UP:  1. You have an appointment in 6 weeks with a ultrasound of your kidneys prior.   2. You have a string attached to your stent, you may remove it on Thursday, December 12th. To do this, pull the strings until the stents are completely removed. You may feel an odd sensation in your back.

## 2018-05-18 NOTE — Op Note (Signed)
Preoperative diagnosis: left ureteral calculus  Postoperative diagnosis: left ureteral calculus  Procedure:  1. Cystoscopy 2. left ureteroscopy and stone removal 3. Ureteroscopic laser lithotripsy 4. left 51F x 24cm ureteral stent placement  5. left retrograde pyelography with interpretation  Surgeon: [name], MD  Anesthesia: General  Complications: None  Intraoperative findings: left retrograde pyelography demonstrated a filling defect within the left ureter consistent with the patient's known calculus without other abnormalities.  EBL: Minimal  Specimens: 1. left ureteral calculus  Disposition of specimens: [facility] for stone analysis  Indication: Sherry Williams is a 57 y.o.   patient with a left ureteral stone and associated left symptoms. After reviewing the management options for treatment, the patient elected to proceed with the above surgical procedure(s). We have discussed the potential benefits and risks of the procedure, side effects of the proposed treatment, the likelihood of the patient achieving the goals of the procedure, and any potential problems that might occur during the procedure or recuperation. Informed consent has been obtained.   Description of procedure:  The patient was taken to the operating room and general anesthesia was induced.  The patient was placed in the dorsal lithotomy position, prepped and draped in the usual sterile fashion, and preoperative antibiotics were administered. A preoperative time-out was performed.   Cystourethroscopy was performed.  The patient's urethra was examined and was normal. The bladder was then systematically examined in its entirety. There was no evidence for any bladder tumors, stones, or other mucosal pathology.    Attention then turned to the left ureteral orifice and a ureteral catheter was used to intubate the ureteral orifice.  Omnipaque contrast was injected through the  ureteral catheter and a retrograde pyelogram was performed with findings as dictated above.  A 0.38 sensor guidewire was then advanced up the left ureter into the renal pelvis under fluoroscopic guidance. The 6 Fr semirigid ureteroscope was then advanced into the ureter next to the guidewire and the calculus was identified.    The stone was then fragmented with the 365 micron holmium laser fiber on a setting of 0.6 and frequency of 6 Hz.  The stone was then retropulsed into the kidney.  I then had to remove the rigid scope and I placed a 12/14 [place] access sheath.  I then used the flexible ureteroscope and was able to encounter the stone up of the stone is a proximal ureter again and laser fragmented into several pieces.  All stones were then removed from the ureter with an N-gage nitinol basket.  Reinspection of the ureter and kidney revealed no remaining visible stones or fragments.   The wire was then backloaded through the cystoscope and a ureteral stent was advance over the wire using Seldinger technique.  The stent was positioned appropriately under fluoroscopic and cystoscopic guidance.  The wire was then removed with an adequate stent curl noted in the renal pelvis as well as in the bladder.  The bladder was then emptied and the procedure ended.  The patient appeared to tolerate the procedure well and without complications.  The patient was able to be awakened and transferred to the recovery unit in satisfactory condition.   Disposition: The tether of the stent was left on and tucked inside the patient's vagina.  Instructions for removing the stent have been provided to the patient. The patient has been scheduled for followup in 6 weeks with a renal ultrasound.

## 2018-05-18 NOTE — Interval H&P Note (Signed)
History and Physical Interval Note:  05/18/2018 9:11 AM  Sherry Williams  has presented today for surgery, with the diagnosis of left ureteral stone  The various methods of treatment have been discussed with the patient and family. After consideration of risks, benefits and other options for treatment, the patient has consented to  Procedure(s): CYSTOSCOPY WITH RETROGRADE PYELOGRAM, URETEROSCOPY AND STENT PLACEMENT (Left) HOLMIUM LASER APPLICATION (Left) as a surgical intervention .  The patient's history has been reviewed, patient examined, no change in status, stable for surgery.  I have reviewed the patient's chart and labs.  Questions were answered to the patient's satisfaction.     [name] W

## 2018-05-18 NOTE — Anesthesia Preprocedure Evaluation (Addendum)
Anesthesia Evaluation  Patient identified by MRN, date of birth, ID band Patient awake    Reviewed: Allergy & Precautions, NPO status , Patient's Chart, lab work & pertinent test results  History of Anesthesia Complications Negative for: history of anesthetic complications  Airway Mallampati: II  TM Distance: >3 FB Neck ROM: Full    Dental no notable dental hx. (+) Dental Advisory Given   Pulmonary neg pulmonary ROS,    Pulmonary exam normal        Cardiovascular negative cardio ROS Normal cardiovascular exam     Neuro/Psych PSYCHIATRIC DISORDERS Depression negative neurological ROS     GI/Hepatic negative GI ROS, Neg liver ROS,   Endo/Other  Hypothyroidism   Renal/GU      Musculoskeletal negative musculoskeletal ROS (+)   Abdominal   Peds  Hematology negative hematology ROS (+)   Anesthesia Other Findings   Reproductive/Obstetrics negative OB ROS                            Anesthesia Physical  Anesthesia Plan  ASA: II  Anesthesia Plan: General   Post-op Pain Management:    Induction: Intravenous  PONV Risk Score and Plan: 3 and Ondansetron, Dexamethasone, Scopolamine patch - Pre-op and Treatment may vary due to age or medical condition  Airway Management Planned: LMA and Oral ETT  Additional Equipment:   Intra-op Plan:   Post-operative Plan: Extubation in OR  Informed Consent: I have reviewed the patients History and Physical, chart, labs and discussed the procedure including the risks, benefits and alternatives for the proposed anesthesia with the patient or authorized representative who has indicated his/her understanding and acceptance.   Dental advisory given  Plan Discussed with: CRNA and Anesthesiologist  Anesthesia Plan Comments:        Anesthesia Quick Evaluation

## 2018-05-18 NOTE — Discharge Summary (Signed)
Date of admission: 05/17/2018  Date of discharge: 05/18/2018  Admission diagnosis: left ureteral stone  Discharge diagnosis: same  Secondary diagnoses:  Patient Active Problem List   Diagnosis Date Noted  . Nephrolithiasis 05/17/2018  . Endometrial cancer ([place]) 04/17/2017  . MRSA (methicillin resistant staph aureus) culture positive 03/14/2017  . Lichen sclerosus et atrophicus 03/14/2017  . Pulmonary embolus ([place]) 08/14/2016  . 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 Briefly, Sherry Williams is a 57 y.o. year old patient with left renal colic and a large ureteral stone.   Hospital Course: she was admitted overnight and taken to the OR on HD # 2.  Patient tolerated the procedure well.  She was then transferred to the floor after an uneventful PACU stay.  Her hospital course was uncomplicated.  On POD#0 she had met discharge criteria: was eating a regular diet, was up and ambulating independently,  pain was well controlled, was voiding without a catheter, and was ready to for discharge.   Laboratory values:  No results for input(s): WBC, HGB, HCT in the last 72 hours. No results for input(s): NA, K, CL, CO2, GLUCOSE, BUN, CREATININE, CALCIUM in the last 72 hours. No results for input(s): LABPT, INR in the last 72 hours. No results for input(s): LABURIN in the last 72 hours. Results for orders placed or performed during the hospital encounter of 04/12/17  Surgical pcr screen     Status: None   Collection Time: 04/12/17 10:20 AM  Result Value Ref Range Status   MRSA, PCR NEGATIVE NEGATIVE Final   Staphylococcus aureus NEGATIVE NEGATIVE Final    Comment: (NOTE) The Xpert SA Assay (FDA approved for NASAL specimens in patients 109 years of  age and older), is one component of a comprehensive surveillance program. It is not intended to diagnose infection nor to guide or monitor treatment.     Disposition: Home  Discharge instruction: The patient was instructed to be ambulatory but told to refrain from heavy lifting, strenuous activity, or driving.   Discharge medications:  Allergies as of 05/18/2018      Reactions   Bee Venom Anaphylaxis   Adhesive [tape] Other (See Comments)   Irritation and red      Medication List    TAKE these medications   ciprofloxacin 500 MG tablet Commonly known as:  CIPRO Take 1 tablet (500 mg total) by mouth once for 1 dose.   ibuprofen 600 MG tablet Commonly known as:  ADVIL,MOTRIN Take 1 tablet (600 mg total) by mouth every 6 (six) hours as needed. What changed:  reasons to take this   ondansetron 4 MG disintegrating tablet Commonly known as:  ZOFRAN-ODT Take 1 tablet (4 mg total) by mouth every 8 (eight) hours as needed for nausea or vomiting.   oxyCODONE-acetaminophen 5-325 MG tablet Commonly known as:  PERCOCET/ROXICET Take 1 tablet by mouth every 4 (four) hours as needed for severe pain.   phenazopyridine 200 MG tablet Commonly known as:  PYRIDIUM Take 1 tablet (200 mg total) by mouth 3 (three) times daily as needed for pain.   SULPHUR-HEEL SL Place 5 tablets under the tongue 3 (three) times daily.   OSCILLOCOCCINUM Pllt Take 1 tablet by mouth 3 (three) times daily.   HOMEOPATHIC CALM SL Place 1 tablet under the tongue 3 (three) times daily.  BERBERIS HOMACCORD PO Take by mouth 2 (two) times daily.   HOMEOPATHIC PRODUCTS PO Take by mouth 2 (two) times daily. [place].   HOMEOPATHIC PRODUCTS PO 3 (three) times daily. heparsulphurouscalc   HOMEOPATHIC PRODUCTS SL Place under the tongue 3 (three) times daily. Magnesium phosphorica.   tamsulosin 0.4 MG Caps capsule Commonly known as:  FLOMAX Take 1 capsule (0.4 mg total) by mouth daily.   thyroid 60 MG  tablet Commonly known as:  ARMOUR Take 1 tablet (60 mg total) by mouth daily before breakfast. Take one daily 5 days per week   thyroid 90 MG tablet Commonly known as:  ARMOUR Take 1 tablet (90 mg total) by mouth daily. Take one daily two days per week       Followup:  Follow-up Information    [name], MD In 6 weeks.   Specialty:  Urology Contact information: [place] [id number] [phone]

## 2018-05-18 NOTE — Anesthesia Postprocedure Evaluation (Signed)
Anesthesia Post Note  Patient: Sherry Williams  Procedure(s) Performed: CYSTOSCOPY WITH RETROGRADE PYELOGRAM, LEFT URETEROSCOPY AND LEFT URETERAL STENT PLACEMENT (Left Ureter) HOLMIUM LASER APPLICATION (Left Ureter)     Patient location during evaluation: PACU Anesthesia Type: General Level of consciousness: sedated Pain management: pain level controlled Vital Signs Assessment: post-procedure vital signs reviewed and stable Respiratory status: spontaneous breathing and respiratory function stable Cardiovascular status: stable Postop Assessment: no apparent nausea or vomiting Anesthetic complications: no    Last Vitals:  Vitals:   05/18/18 1200 05/18/18 1219  BP: 119/67 (!) 122/55  Pulse: 60 (!) 55  Resp: 12 14  Temp: 36.4 C 36.5 C  SpO2: 100% 100%    Last Pain:  Vitals:   05/18/18 1219  TempSrc: Oral  PainSc: 0-No pain                 Kwaku Mostafa DANIEL

## 2018-05-18 NOTE — Transfer of Care (Signed)
Immediate Anesthesia Transfer of Care Note  Patient: Sherry Williams  Procedure(s) Performed: CYSTOSCOPY WITH RETROGRADE PYELOGRAM, LEFT URETEROSCOPY AND LEFT URETERAL STENT PLACEMENT (Left Ureter) HOLMIUM LASER APPLICATION (Left Ureter)  Patient Location: PACU  Anesthesia Type:General  Level of Consciousness: awake, alert  and oriented  Airway & Oxygen Therapy: Spontaneous breathing and face mask O2  Post-op Assessment: Report given to RN and Post -op Vital signs reviewed and stable  Post vital signs: Reviewed and stable  Last Vitals:  Vitals Value Taken Time  BP 121/67 05/18/2018 11:17 AM  Temp    Pulse 75 05/18/2018 11:18 AM  Resp 14 05/18/2018 11:18 AM  SpO2 100 % 05/18/2018 11:18 AM  Vitals shown include unvalidated device data.  Last Pain:  Vitals:   05/18/18 0745  TempSrc:   PainSc: 0-No pain      Patients Stated Pain Goal: 0 (34/35/68 6168)  Complications: No apparent anesthesia complications

## 2018-05-19 ENCOUNTER — Encounter (HOSPITAL_COMMUNITY): Payer: Self-pay | Admitting: Urology

## 2018-05-20 ENCOUNTER — Other Ambulatory Visit: Payer: Self-pay | Admitting: Urology

## 2018-05-27 ENCOUNTER — Encounter (HOSPITAL_BASED_OUTPATIENT_CLINIC_OR_DEPARTMENT_OTHER): Payer: Self-pay | Admitting: *Deleted

## 2018-05-27 ENCOUNTER — Other Ambulatory Visit: Payer: Self-pay

## 2018-05-27 NOTE — Progress Notes (Addendum)
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

## 2018-05-30 ENCOUNTER — Ambulatory Visit (HOSPITAL_BASED_OUTPATIENT_CLINIC_OR_DEPARTMENT_OTHER)
Admission: RE | Admit: 2018-05-30 | Discharge: 2018-05-30 | Disposition: A | Discharge status: Home / Self Care | Payer: 59 | Attending: Urology | Admitting: Urology

## 2018-05-30 ENCOUNTER — Ambulatory Visit (HOSPITAL_BASED_OUTPATIENT_CLINIC_OR_DEPARTMENT_OTHER): Payer: 59 | Admitting: Anesthesiology

## 2018-05-30 ENCOUNTER — Encounter (HOSPITAL_BASED_OUTPATIENT_CLINIC_OR_DEPARTMENT_OTHER): Payer: Self-pay | Admitting: *Deleted

## 2018-05-30 ENCOUNTER — Encounter (HOSPITAL_BASED_OUTPATIENT_CLINIC_OR_DEPARTMENT_OTHER)
Admission: RE | Disposition: A | Discharge status: Home / Self Care | Payer: Self-pay | Source: Home / Self Care | Attending: Urology

## 2018-05-30 DIAGNOSIS — Z841 Family history of disorders of kidney and ureter: Secondary | ICD-10-CM | POA: Diagnosis not present

## 2018-05-30 DIAGNOSIS — Z8042 Family history of malignant neoplasm of prostate: Secondary | ICD-10-CM | POA: Insufficient documentation

## 2018-05-30 DIAGNOSIS — N200 Calculus of kidney: Secondary | ICD-10-CM | POA: Diagnosis not present

## 2018-05-30 DIAGNOSIS — Z91030 Bee allergy status: Secondary | ICD-10-CM | POA: Insufficient documentation

## 2018-05-30 DIAGNOSIS — E063 Autoimmune thyroiditis: Secondary | ICD-10-CM | POA: Diagnosis not present

## 2018-05-30 DIAGNOSIS — E039 Hypothyroidism, unspecified: Secondary | ICD-10-CM | POA: Insufficient documentation

## 2018-05-30 DIAGNOSIS — N202 Calculus of kidney with calculus of ureter: Secondary | ICD-10-CM | POA: Diagnosis present

## 2018-05-30 DIAGNOSIS — Z8542 Personal history of malignant neoplasm of other parts of uterus: Secondary | ICD-10-CM | POA: Diagnosis not present

## 2018-05-30 DIAGNOSIS — Z86711 Personal history of pulmonary embolism: Secondary | ICD-10-CM | POA: Diagnosis not present

## 2018-05-30 DIAGNOSIS — Z87442 Personal history of urinary calculi: Secondary | ICD-10-CM | POA: Insufficient documentation

## 2018-05-30 HISTORY — PX: HOLMIUM LASER APPLICATION: SHX5852

## 2018-05-30 HISTORY — PX: CYSTOSCOPY WITH RETROGRADE PYELOGRAM, URETEROSCOPY AND STENT PLACEMENT: SHX5789

## 2018-05-30 LAB — POCT I-STAT, CHEM 8
BUN: 14 mg/dL (ref 6–20)
Calcium, Ion: 1.27 mmol/L (ref 1.15–1.40)
Chloride: 106 mmol/L (ref 98–111)
Creatinine, Ser: 0.8 mg/dL (ref 0.44–1.00)
Glucose, Bld: 85 mg/dL (ref 70–99)
HCT: 37 % (ref 36.0–46.0)
Hemoglobin: 12.6 g/dL (ref 12.0–15.0)
Potassium: 3.9 mmol/L (ref 3.5–5.1)
Sodium: 141 mmol/L (ref 135–145)
TCO2: 27 mmol/L (ref 22–32)

## 2018-05-30 SURGERY — CYSTOURETEROSCOPY, WITH RETROGRADE PYELOGRAM AND STENT INSERTION
Anesthesia: General | Site: Ureter | Laterality: Right

## 2018-05-30 MED ORDER — PROPOFOL 10 MG/ML IV BOLUS
INTRAVENOUS | Status: AC
  Filled 2018-05-30: qty 40

## 2018-05-30 MED ORDER — KETOROLAC TROMETHAMINE 30 MG/ML IJ SOLN
INTRAMUSCULAR | Status: AC
  Filled 2018-05-30: qty 1

## 2018-05-30 MED ORDER — SCOPOLAMINE 1 MG/3DAYS TD PT72
MEDICATED_PATCH | TRANSDERMAL | Status: AC
  Filled 2018-05-30: qty 1

## 2018-05-30 MED ORDER — FENTANYL CITRATE (PF) 100 MCG/2ML IJ SOLN
INTRAMUSCULAR | Status: AC
  Filled 2018-05-30: qty 2

## 2018-05-30 MED ORDER — KETOROLAC TROMETHAMINE 30 MG/ML IJ SOLN
INTRAMUSCULAR | Status: DC | PRN
  Administered 2018-05-30: 30 mg via INTRAVENOUS

## 2018-05-30 MED ORDER — SCOPOLAMINE 1 MG/3DAYS TD PT72
MEDICATED_PATCH | TRANSDERMAL | Status: DC | PRN
  Administered 2018-05-30: 1 via TRANSDERMAL

## 2018-05-30 MED ORDER — CIPROFLOXACIN HCL 500 MG PO TABS: 500.0000 mg | ORAL_TABLET | Freq: Once | ORAL | 0 refills | Status: AC

## 2018-05-30 MED ORDER — GENTAMICIN SULFATE 40 MG/ML IJ SOLN
240.0000 mg | INTRAVENOUS | Status: AC
  Administered 2018-05-30: 240 mg via INTRAVENOUS
  Filled 2018-05-30 (×2): qty 6

## 2018-05-30 MED ORDER — MIDAZOLAM HCL 5 MG/5ML IJ SOLN
INTRAMUSCULAR | Status: DC | PRN
  Administered 2018-05-30: 2 mg via INTRAVENOUS

## 2018-05-30 MED ORDER — ONDANSETRON HCL 4 MG/2ML IJ SOLN
INTRAMUSCULAR | Status: AC
  Filled 2018-05-30: qty 2

## 2018-05-30 MED ORDER — ARTIFICIAL TEARS OPHTHALMIC OINT
TOPICAL_OINTMENT | OPHTHALMIC | Status: AC
  Filled 2018-05-30: qty 3.5

## 2018-05-30 MED ORDER — DEXAMETHASONE SODIUM PHOSPHATE 4 MG/ML IJ SOLN
INTRAMUSCULAR | Status: DC | PRN
  Administered 2018-05-30: 10 mg via INTRAVENOUS

## 2018-05-30 MED ORDER — PHENAZOPYRIDINE HCL 200 MG PO TABS: 200.0000 mg | ORAL_TABLET | Freq: Three times a day (TID) | ORAL | 0 refills | Status: DC | PRN

## 2018-05-30 MED ORDER — IOHEXOL 300 MG/ML  SOLN
INTRAMUSCULAR | Status: DC | PRN
  Administered 2018-05-30: 20 mL

## 2018-05-30 MED ORDER — ONDANSETRON HCL 4 MG/2ML IJ SOLN
INTRAMUSCULAR | Status: DC | PRN
  Administered 2018-05-30: 4 mg via INTRAVENOUS

## 2018-05-30 MED ORDER — DEXAMETHASONE SODIUM PHOSPHATE 10 MG/ML IJ SOLN
INTRAMUSCULAR | Status: AC
  Filled 2018-05-30: qty 1

## 2018-05-30 MED ORDER — BELLADONNA ALKALOIDS-OPIUM 16.2-60 MG RE SUPP
RECTAL | Status: DC | PRN
  Administered 2018-05-30: 1 via RECTAL

## 2018-05-30 MED ORDER — SODIUM CHLORIDE 0.9 % IR SOLN
Status: DC | PRN
  Administered 2018-05-30: 3000 mL

## 2018-05-30 MED ORDER — LACTATED RINGERS IV SOLN
INTRAVENOUS | Status: DC
  Administered 2018-05-30: 1000 mL via INTRAVENOUS
  Administered 2018-05-30: 07:00:00 via INTRAVENOUS
  Filled 2018-05-30: qty 1000

## 2018-05-30 MED ORDER — LIDOCAINE 2% (20 MG/ML) 5 ML SYRINGE
INTRAMUSCULAR | Status: AC
  Filled 2018-05-30: qty 5

## 2018-05-30 MED ORDER — PROPOFOL 10 MG/ML IV BOLUS
INTRAVENOUS | Status: DC | PRN
  Administered 2018-05-30: 150 mg via INTRAVENOUS

## 2018-05-30 MED ORDER — MIDAZOLAM HCL 2 MG/2ML IJ SOLN
INTRAMUSCULAR | Status: AC
  Filled 2018-05-30: qty 2

## 2018-05-30 MED ORDER — LIDOCAINE 2% (20 MG/ML) 5 ML SYRINGE
INTRAMUSCULAR | Status: DC | PRN
  Administered 2018-05-30: 80 mg via INTRAVENOUS

## 2018-05-30 MED ORDER — FENTANYL CITRATE (PF) 100 MCG/2ML IJ SOLN
INTRAMUSCULAR | Status: DC | PRN
  Administered 2018-05-30 (×2): 50 ug via INTRAVENOUS

## 2018-05-30 SURGICAL SUPPLY — 27 items
BAG DRAIN URO-CYSTO SKYTR STRL (DRAIN) ×2 IMPLANT
BAG DRN UROCATH (DRAIN) ×1
BASKET LASER NITINOL 1.9FR (BASKET) IMPLANT
BASKET STONE 1.7 NGAGE (UROLOGICAL SUPPLIES) ×1 IMPLANT
BSKT STON RTRVL 120 1.9FR (BASKET)
CATH URET 5FR 28IN OPEN ENDED (CATHETERS) ×2 IMPLANT
CATH URET DUAL LUMEN 6-10FR 50 (CATHETERS) IMPLANT
CLOTH BEACON ORANGE TIMEOUT ST (SAFETY) ×2 IMPLANT
EXTRACTOR STONE 1.7FRX115CM (UROLOGICAL SUPPLIES) IMPLANT
FIBER LASER TRAC TIP (UROLOGICAL SUPPLIES) ×1 IMPLANT
GLOVE BIO SURGEON STRL SZ7.5 (GLOVE) ×2 IMPLANT
GLOVE SURG SS PI 7.5 STRL IVOR (GLOVE) ×2 IMPLANT
GOWN STRL REUS W/ TWL LRG LVL3 (GOWN DISPOSABLE) IMPLANT
GOWN STRL REUS W/TWL LRG LVL3 (GOWN DISPOSABLE) ×2
GOWN STRL REUS W/TWL XL LVL3 (GOWN DISPOSABLE) ×2 IMPLANT
GUIDEWIRE ANG ZIPWIRE 038X150 (WIRE) IMPLANT
GUIDEWIRE STR DUAL SENSOR (WIRE) ×2 IMPLANT
INFUSOR MANOMETER BAG 3000ML (MISCELLANEOUS) IMPLANT
IV NS IRRIG 3000ML ARTHROMATIC (IV SOLUTION) ×3 IMPLANT
KIT TURNOVER CYSTO (KITS) ×2 IMPLANT
MANIFOLD NEPTUNE II (INSTRUMENTS) ×1 IMPLANT
NS IRRIG 500ML POUR BTL (IV SOLUTION) ×2 IMPLANT
PACK CYSTO (CUSTOM PROCEDURE TRAY) ×2 IMPLANT
SHEATH URETERAL 12FRX35CM (MISCELLANEOUS) ×1 IMPLANT
STENT URET 6FRX24 CONTOUR (STENTS) ×1 IMPLANT
TUBE CONNECTING 12X1/4 (SUCTIONS) ×1 IMPLANT
TUBING UROLOGY SET (TUBING) ×2 IMPLANT

## 2018-05-30 NOTE — Op Note (Signed)
Preoperative diagnosis:  1. Right nonobstructing renal stones  Postoperative diagnosis:  1. Same  Procedure: 1. Cystoscopy with right retrograde pyelogram with interpretation 2. Right ureteroscopy, laser lithotripsy stone removal 3. Right ureteral stent placement  Surgeon: [name], MD  Anesthesia: General  Complications: None  Intraoperative findings:  #1: The patient had 2 stones, both approximately 4 mm in size, all stone fragments were removed.   #2: A 24 cm x 6 French double-J ureteral stent was placed in the right ureter. #3: Retrograde pyelogram demonstrated a normal caliber ureter with no filling defects or significant abnormalities.  The calyces were sharp.  EBL: Minimal  Specimens: The patient's stone fragments were sent home with the patient.  Indication: Aleesa Arriel Victor is a 57 y.o. patient with right nonobstructing stones.  After reviewing the management options for treatment, he elected to proceed with the above surgical procedure(s). We have discussed the potential benefits and risks of the procedure, side effects of the proposed treatment, the likelihood of the patient achieving the goals of the procedure, and any potential problems that might occur during the procedure or recuperation. Informed consent has been obtained.  Description of procedure:  The patient was taken to the operating room and general anesthesia was induced.  The patient was placed in the dorsal lithotomy position, prepped and draped in the usual sterile fashion, and preoperative antibiotics were administered. A preoperative time-out was performed.   A 21 French 30 degrees cystoscope was gently passed through the patient's urethra and into the bladder under visual guidance.  A 5 French open-ended catheter was then used to perform a retrograde pyelogram in the right kidney.  Injecting 10 cc of Omnipaque contrast the ureter was noted to be of normal caliber with no filling defects.  The  collecting system was sharp, there was some poor filling in the mid calyx, but the stones did not appear clearly under the fluoroscopic images.  I then advanced a 0.38 sensor wire up through the scope and into the right ureter.  I then backed the cystoscope out over the wire after the bladder was emptied.  I then advanced the inner portion of the 12/14 French ureteral access sheath over the wire which went quite easily.  I then passed both the inner and outer layer of the access sheath under fluoroscopic guidance up to the UPJ.  The inner portion of the access sheath was removed.  The flexible ureteroscope was then advanced up into the right renal pelvis through the access sheath.  Pyeloscopy demonstrated 2 stones one in the lower pole and one in the midpole.  Using a 200 m laser fiber with a setting of 0.6 J and 6 Hz the stones were fragmented into small pieces.  These pieces were then grabbed with an engage basket and removed.  Once all the stone fragments were removed I repeated the pyeloscopy and remaining stone fragments were noted.  I then injected more contrast into the patient's collecting system and repassed the wire.  I slowly backed out the scope removing the ureteral access sheath simultaneously noting no significant ureteral trauma.  I then advanced a 24 cm x 6 French double-J stent over the wire and advanced it into the right renal pelvis under fluoroscopic guidance.  Once the stent was noted to be in the renal pelvis I slowly backed out the wire wire advancing the stent to the urethral meatus.  Once the wire was completely removed with the distal end of the stent curled  into the patient's bladder.  There was a nice curl in the patient's renal pelvis confirmed under fluoroscopy.  The bladder was then drained.  Stent tether was left on and tucked into the patient's vagina.  A B&O suppository was placed to the patient's rectum and she was subsequently extubated return the PACU in stable  condition.  Disposition: The patient will be instructed to remove the stent on Monday, December 23.  She has follow-up scheduled with me in 6 weeks.  [name], M.D.

## 2018-05-30 NOTE — Anesthesia Postprocedure Evaluation (Signed)
Anesthesia Post Note  Patient: Sherry Williams  Procedure(s) Performed: CYSTOSCOPY WITH RIGHT RETROGRADE PYELOGRAM, URETEROSCOPY LASER LITHOTRIPSY AND STENT PLACEMENT (Right Ureter) HOLMIUM LASER APPLICATION (Right Ureter)     Patient location during evaluation: PACU Anesthesia Type: General Level of consciousness: awake and alert Pain management: pain level controlled Vital Signs Assessment: post-procedure vital signs reviewed and stable Respiratory status: spontaneous breathing, nonlabored ventilation, respiratory function stable and patient connected to nasal cannula oxygen Cardiovascular status: blood pressure returned to baseline and stable Postop Assessment: no apparent nausea or vomiting Anesthetic complications: no    Last Vitals:  Vitals:   05/30/18 0900 05/30/18 0915  BP: 102/73 104/73  Pulse: (!) 56 (!) 55  Resp: 10 13  Temp:    SpO2: 97% 99%    Last Pain:  Vitals:   05/30/18 0945  TempSrc:   PainSc: 3                  Sherry Williams

## 2018-05-30 NOTE — Discharge Instructions (Signed)
DISCHARGE INSTRUCTIONS FOR KIDNEY STONE/URETERAL STENT   MEDICATIONS:  1. Resume all your other meds from home.  2. Pyridium is to help with the burning/stinging when you urinate. 3. Take Cipro one hour prior to removal of your stent.   ACTIVITY:  1. No strenuous activity x 1week  2. No driving while on narcotic pain medications  3. Drink plenty of water  4. Continue to walk at home - you can still get blood clots when you are at home, so keep active, but don't over do it.  5. May return to work/school tomorrow or when you feel ready   BATHING:  1. You can shower and we recommend daily showers  2. You have a string coming from your urethra: The stent string is attached to your ureteral stent. Do not pull on this.   SIGNS/SYMPTOMS TO CALL:  Please call us if you have a fever greater than 101.5, uncontrolled nausea/vomiting, uncontrolled pain, dizziness, unable to urinate, bloody urine, chest pain, shortness of breath, leg swelling, leg pain, redness around wound, drainage from wound, or any other concerns or questions.   You can reach [place] at [phone].   FOLLOW-UP:  1. You have an appointment in 6 weeks with a ultrasound of your kidneys prior.   2. You have a string attached to your stent, you may remove it on Monday Dec 23rd. To do this, pull the strings until the stents are completely removed. You may feel an odd sensation in your back.  Do not take any nonsteroidal anti inflammatories (Aleve, Advil, Motrin, Ibuprofen) until after 2:30 pm today.     Post Anesthesia Home Care Instructions  Activity: Get plenty of rest for the remainder of the day. A responsible individual must stay with you for 24 hours following the procedure.  For the next 24 hours, DO NOT: -Drive a car -Paediatric nurse -Drink alcoholic beverages -Take any medication unless instructed by your physician -Make any legal decisions or sign important papers.  Meals: Start with liquid foods such as gelatin  or soup. Progress to regular foods as tolerated. Avoid greasy, spicy, heavy foods. If nausea and/or vomiting occur, drink only clear liquids until the nausea and/or vomiting subsides. Call your physician if vomiting continues.  Special Instructions/Symptoms: Your throat may feel dry or sore from the anesthesia or the breathing tube placed in your throat during surgery. If this causes discomfort, gargle with warm salt water. The discomfort should disappear within 24 hours.  If you had a scopolamine patch placed behind your ear for the management of post- operative nausea and/or vomiting:  1. The medication in the patch is effective for 72 hours, after which it should be removed.  Wrap patch in a tissue and discard in the trash. Wash hands thoroughly with soap and water. 2. You may remove the patch earlier than 72 hours if you experience unpleasant side effects which may include dry mouth, dizziness or visual disturbances. 3. Avoid touching the patch. Wash your hands with soap and water after contact with the patch.

## 2018-05-30 NOTE — Interval H&P Note (Signed)
History and Physical Interval Note: The patient tolerated the stone extraction of her left mid ureter.  After discussing the treatment options on the right for her nonobstructing stones, the patient is opted to proceed with preventative ureteroscopy and removal of the 2 remaining stones in her right kidney.  We discussed the benefits of this approach as well as the associated risks and expected outcomes.  The patient has been fully informed and has agreed to proceed.  There have been no changes to her history and physical.  05/30/2018 7:45 AM  Sherry Williams  has presented today for surgery, with the diagnosis of RIGHT RENAL STONES  The various methods of treatment have been discussed with the patient and family. After consideration of risks, benefits and other options for treatment, the patient has consented to  Procedure(s): CYSTOSCOPY WITH RIGHT RETROGRADE PYELOGRAM, URETEROSCOPY LASER LITHOTRIPSY AND STENT PLACEMENT (Right) HOLMIUM LASER APPLICATION (Right) as a surgical intervention .  The patient's history has been reviewed, patient examined, no change in status, stable for surgery.  I have reviewed the patient's chart and labs.  Questions were answered to the patient's satisfaction.     [name]

## 2018-05-30 NOTE — Anesthesia Procedure Notes (Signed)
Procedure Name: LMA Insertion Date/Time: 05/30/2018 7:55 AM Performed by: Roberts Gaudy, MD Pre-anesthesia Checklist: Patient identified, Emergency Drugs available, Suction available and Patient being monitored Patient Re-evaluated:Patient Re-evaluated prior to induction Oxygen Delivery Method: Circle system utilized Preoxygenation: Pre-oxygenation with 100% oxygen Induction Type: IV induction Ventilation: Mask ventilation without difficulty LMA: LMA inserted LMA Size: 4.0 Number of attempts: 1 Placement Confirmation: positive ETCO2 Tube secured with: Tape Dental Injury: Teeth and Oropharynx as per pre-operative assessment

## 2018-05-30 NOTE — Anesthesia Preprocedure Evaluation (Signed)
Anesthesia Evaluation  Patient identified by MRN, date of birth, ID band Patient awake    Reviewed: Allergy & Precautions, NPO status , Patient's Chart, lab work & pertinent test results  Airway Mallampati: II  TM Distance: >3 FB Neck ROM: Full    Dental  (+) Teeth Intact, Dental Advisory Given   Pulmonary    breath sounds clear to auscultation       Cardiovascular  Rhythm:Regular Rate:Normal     Neuro/Psych    GI/Hepatic   Endo/Other    Renal/GU      Musculoskeletal   Abdominal   Peds  Hematology   Anesthesia Other Findings   Reproductive/Obstetrics                             Anesthesia Physical Anesthesia Plan  ASA: I  Anesthesia Plan: General   Post-op Pain Management:    Induction: Intravenous  PONV Risk Score and Plan: Ondansetron and Dexamethasone  Airway Management Planned: LMA  Additional Equipment:   Intra-op Plan:   Post-operative Plan:   Informed Consent: I have reviewed the patients History and Physical, chart, labs and discussed the procedure including the risks, benefits and alternatives for the proposed anesthesia with the patient or authorized representative who has indicated his/her understanding and acceptance.   Dental advisory given  Plan Discussed with: CRNA and Anesthesiologist  Anesthesia Plan Comments:         Anesthesia Quick Evaluation

## 2018-05-30 NOTE — Transfer of Care (Signed)
Last Vitals:  Vitals Value Taken Time  BP    Temp 36.3 C 05/30/2018  8:45 AM  Pulse    Resp    SpO2      Last Pain:  Vitals:   05/30/18 0624  TempSrc:   PainSc: 0-No pain      Patients Stated Pain Goal: 4 (05/30/18 3361) Immediate Anesthesia Transfer of Care Note  Patient: Sherry Williams  Procedure(s) Performed: Procedure(s) (LRB): CYSTOSCOPY WITH RIGHT RETROGRADE PYELOGRAM, URETEROSCOPY LASER LITHOTRIPSY AND STENT PLACEMENT (Right) HOLMIUM LASER APPLICATION (Right)  Patient Location: PACU  Anesthesia Type: General  Level of Consciousness: awake, alert  and oriented  Airway & Oxygen Therapy: Patient Spontanous Breathing and Patient connected to nasal cannula oxygen  Post-op Assessment: Report given to PACU RN and Post -op Vital signs reviewed and stable  Post vital signs: Reviewed and stable  Complications: No apparent anesthesia complications

## 2018-05-31 ENCOUNTER — Encounter (HOSPITAL_BASED_OUTPATIENT_CLINIC_OR_DEPARTMENT_OTHER): Payer: Self-pay | Admitting: Urology

## 2018-06-01 ENCOUNTER — Encounter (HOSPITAL_COMMUNITY): Payer: Self-pay

## 2018-06-01 ENCOUNTER — Other Ambulatory Visit: Payer: Self-pay

## 2018-06-01 ENCOUNTER — Emergency Department (HOSPITAL_COMMUNITY): Payer: 59

## 2018-06-01 ENCOUNTER — Emergency Department (HOSPITAL_COMMUNITY)
Admission: EM | Admit: 2018-06-01 | Discharge: 2018-06-02 | Disposition: A | Discharge status: Home / Self Care | Payer: 59 | Attending: Emergency Medicine | Admitting: Emergency Medicine

## 2018-06-01 DIAGNOSIS — N23 Unspecified renal colic: Secondary | ICD-10-CM

## 2018-06-01 DIAGNOSIS — E063 Autoimmune thyroiditis: Secondary | ICD-10-CM | POA: Diagnosis not present

## 2018-06-01 DIAGNOSIS — R748 Abnormal levels of other serum enzymes: Secondary | ICD-10-CM

## 2018-06-01 DIAGNOSIS — Z79899 Other long term (current) drug therapy: Secondary | ICD-10-CM | POA: Insufficient documentation

## 2018-06-01 DIAGNOSIS — D649 Anemia, unspecified: Secondary | ICD-10-CM | POA: Diagnosis not present

## 2018-06-01 DIAGNOSIS — R1031 Right lower quadrant pain: Secondary | ICD-10-CM | POA: Diagnosis present

## 2018-06-01 DIAGNOSIS — N132 Hydronephrosis with renal and ureteral calculous obstruction: Secondary | ICD-10-CM | POA: Diagnosis not present

## 2018-06-01 NOTE — ED Notes (Signed)
Patient ambulated to restroom with minimal assistance. ?

## 2018-06-01 NOTE — ED Notes (Signed)
Patient transported to CT 

## 2018-06-01 NOTE — ED Triage Notes (Signed)
States had stent placed at surgical center and states stent came out Friday morning and since she has had pain off and on in right flank area, with nausea and vomiting. No fever.

## 2018-06-01 NOTE — ED Provider Notes (Signed)
[facility] DEPT Provider Note   CSN: [id number] Arrival date & time: 06/01/18  2203     History   Chief Complaint Chief Complaint  Patient presents with  . Flank Pain    right    HPI Sherry Williams is a 57 y.o. female.  The history is provided by the patient.  She has history of Hashimoto's disease, pulmonary embolism, nephrolithiasis and had ureteroscopy with stone removal right kidney 2 days ago.  She was sent home with stent in place, but stent actually came out spontaneously yesterday.  Patient stated that she continued to have pain following the procedure which got significantly worse today.  There was associated nausea and vomiting.  She has not had fever or chills or sweats.  She has sense of urinary urgency but is not passing much urine.  She has been taking oxycodone for pain without relief.  Pain was rated at 8/10.  However, since arriving in the ED, pain has subsided and is now only 2/10.  Past Medical History:  Diagnosis Date  . Allergy   . DVT (deep venous thrombosis) ([place])    lower extremity   . Endometrial ca ([place]) 03/14/2017  . Family history of adverse reaction to anesthesia    children had N/V   . Hypothyroidism    Hashimoto  . Lichen sclerosus   . MRSA (methicillin resistant staph aureus) culture positive   . Pulmonary embolism ([place]) 01/13/2016   After car trip    Patient Active Problem List   Diagnosis Date Noted  . Nephrolithiasis 05/17/2018  . Endometrial cancer ([place]) 04/17/2017  . MRSA (methicillin resistant staph aureus) culture positive 03/14/2017  . Lichen sclerosus et atrophicus 03/14/2017  . Pulmonary embolus ([place]) 08/14/2016  . 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 CYSTOSCOPY WITH RETROGRADE PYELOGRAM, URETEROSCOPY AND STENT PLACEMENT Left 05/18/2018   Procedure: CYSTOSCOPY WITH RETROGRADE PYELOGRAM, LEFT URETEROSCOPY AND LEFT URETERAL  STENT PLACEMENT;  Surgeon: [name], MD;  [facility]: [facility];  Service: Urology;  Laterality: Left;  . CYSTOSCOPY WITH RETROGRADE PYELOGRAM, URETEROSCOPY AND STENT PLACEMENT Right 05/30/2018   Procedure: CYSTOSCOPY WITH RIGHT RETROGRADE PYELOGRAM, URETEROSCOPY LASER LITHOTRIPSY AND STENT PLACEMENT;  Surgeon: [name], MD;  Location: [facility];  Service: Urology;  Laterality: Right;  . HOLMIUM LASER APPLICATION Left 13/07/4399   Procedure: HOLMIUM LASER APPLICATION;  Surgeon: [name], MD;  [facility];  Service: Urology;  Laterality: Left;  . HOLMIUM LASER APPLICATION Right 02/72/5366   Procedure: HOLMIUM LASER APPLICATION;  Surgeon: [name], MD;  Location: [facility];  Service: Urology;  Laterality: Right;  . MOUTH SURGERY    . ROBOTIC ASSISTED TOTAL HYSTERECTOMY WITH BILATERAL SALPINGO OOPHERECTOMY Bilateral 04/17/2017   Procedure: XI ROBOTIC ASSISTED TOTAL HYSTERECTOMY WITH BILATERAL SALPINGO OOPHORECTOMY WITH SENTINAL LYMPH NODE BIOPSY AND POSSIBLE LYMPHADENECTOMY;  Surgeon: [name], MD;  Location: [facility];  Service: Gynecology;  Laterality: Bilateral;     OB History   No obstetric history on file.      Home Medications    Prior to Admission medications   Medication Sig Start Date End Date Taking? Authorizing Provider  Homeopathic Products (BERBERIS HOMACCORD PO) Take by mouth 2 (two) times daily.    [provider]  Homeopathic Products (HOMEOPATHIC CALM SL) Place 1 tablet under the tongue 3 (three) times daily.    [provider]  Homeopathic Products (OSCILLOCOCCINUM) PLLT  Take 1 tablet by mouth 3 (three) times daily.    [provider]  Homeopathic Products (SULPHUR-HEEL SL) Place 5 tablets under the tongue 3 (three) times daily.    [provider]  HOMEOPATHIC PRODUCTS PO Take by mouth 2 (two) times daily. [place].    [provider]  HOMEOPATHIC  PRODUCTS PO 3 (three) times daily. heparsulphurouscalc    [provider]  HOMEOPATHIC PRODUCTS SL Place under the tongue 3 (three) times daily. Magnesium phosphorica.    [provider]  ibuprofen (ADVIL,MOTRIN) 600 MG tablet Take 1 tablet (600 mg total) by mouth every 6 (six) hours as needed. Patient taking differently: Take 600 mg by mouth every 6 (six) hours as needed (pain.).  05/13/18   Recardo Evangelist, PA-C  ondansetron (ZOFRAN ODT) 4 MG disintegrating tablet Take 1 tablet (4 mg total) by mouth every 8 (eight) hours as needed for nausea or vomiting. 05/13/18   Recardo Evangelist, PA-C  oxyCODONE-acetaminophen (PERCOCET/ROXICET) 5-325 MG tablet Take 1 tablet by mouth every 4 (four) hours as needed for severe pain. 05/13/18   Recardo Evangelist, PA-C  phenazopyridine (PYRIDIUM) 200 MG tablet Take 1 tablet (200 mg total) by mouth 3 (three) times daily as needed for pain. 05/30/18   [name], MD  thyroid (ARMOUR) 60 MG tablet Take 1 tablet (60 mg total) by mouth daily before breakfast. Take one daily 5 days per week 02/20/18   Burchette, Alinda Sierras, MD  thyroid (ARMOUR) 90 MG tablet Take 1 tablet (90 mg total) by mouth daily. Take one daily two days per week 02/20/18   Burchette, Alinda Sierras, MD    Family History Family History  Problem Relation Age of Onset  . Stroke Father   . Alzheimer's disease Father   . Hyperlipidemia Father   . Deep vein thrombosis Sister   . Breast cancer Paternal Grandmother        In her 76s, but lived to be very elderly  . Arthritis Mother   . Lung cancer Maternal Grandmother   . Diabetes Paternal Grandfather   . Pancreatic cancer Paternal Grandfather     Social History Social History   Tobacco Use  . Smoking status: Never Smoker  . Smokeless tobacco: Never Used  Substance Use Topics  . Alcohol use: Yes    Comment: occassional  . Drug use: No     Allergies   Bee venom and Adhesive [tape]   Review of Systems Review of  Systems  All other systems reviewed and are negative.    Physical Exam Updated Vital Signs BP 121/70 (BP Location: Left Arm)   Pulse (!) 55   Temp 98.1 F (36.7 C) (Oral)   Resp 18   Ht 5\' 6"  (1.676 m)   Wt 63.5 kg   LMP 09/04/2012   SpO2 98%   BMI 22.60 kg/m   Physical Exam Vitals signs and nursing note reviewed.    57 year old female, resting comfortably and in no acute distress. Vital signs are significant for slow heart rate. Oxygen saturation is 98%, which is normal. Head is normocephalic and atraumatic. PERRLA, EOMI. Oropharynx is clear. Neck is nontender and supple without adenopathy or JVD. Back is nontender in the midline.  There is mild right CVA tenderness. Lungs are clear without rales, wheezes, or rhonchi. Chest is nontender. Heart has regular rate and rhythm without murmur. Abdomen is soft, flat, nontender without masses or hepatosplenomegaly and peristalsis is normoactive. Extremities have no cyanosis or  edema, full range of motion is present. Skin is warm and dry without rash. Neurologic: Mental status is normal, cranial nerves are intact, there are no motor or sensory deficits.  ED Treatments / Results  Labs (all labs ordered are listed, but only abnormal results are displayed) Labs Reviewed  COMPREHENSIVE METABOLIC PANEL - Abnormal; Notable for the following components:      Result Value   Glucose, Bld 106 (*)    Total Protein 5.7 (*)    All other components within normal limits  LIPASE, BLOOD - Abnormal; Notable for the following components:   Lipase 74 (*)    All other components within normal limits  CBC WITH DIFFERENTIAL/PLATELET - Abnormal; Notable for the following components:   RBC 3.71 (*)    Hemoglobin 11.1 (*)    HCT 34.9 (*)    All other components within normal limits  URINALYSIS, ROUTINE W REFLEX MICROSCOPIC - Abnormal; Notable for the following components:   Color, Urine AMBER (*)    Hgb urine dipstick LARGE (*)    Protein, ur 30  (*)    Nitrite POSITIVE (*)    RBC / HPF >50 (*)    Bacteria, UA RARE (*)    All other components within normal limits    Radiology Ct Renal Stone Study  Result Date: 06/01/2018 CLINICAL DATA:  Right flank pain, status post kidney stone removal procedure. Status post cystoscopy and retrograde pyelogram 2 days ago. Prior hysterectomy. EXAM: CT ABDOMEN AND PELVIS WITHOUT CONTRAST TECHNIQUE: Multidetector CT imaging of the abdomen and pelvis was performed following the standard protocol without IV contrast. COMPARISON:  05/13/2018 FINDINGS: Lower chest: Clear lung bases. Normal heart size without pericardial or pleural effusion. Hepatobiliary: Normal liver. Normal gallbladder, without biliary ductal dilatation. Pancreas: Normal, without mass or ductal dilatation. Spleen: Normal in size, without focal abnormality. Adrenals/Urinary Tract: Normal adrenal glands. Punctate bilateral renal collecting system calculi. Resolution of left-sided hydronephrosis since 05/13/2018. Mild right-sided caliectasis is new since 05/13/2018. New periureteric edema. Hydroureter is followed to the level of the urinary bladder, without obstructive stone. There is equivocal hyperattenuation in the region of the right ureterovesicular junction, including image 65/2. No bladder calculi. Stomach/Bowel: Normal stomach, without wall thickening. Colonic stool burden suggests constipation. Normal small bowel. Vascular/Lymphatic: Normal caliber of the aorta and branch vessels. No abdominopelvic adenopathy. Reproductive: Hysterectomy.  No adnexal mass. Other: Trace cul-de-sac fluid, including on image 61/2. New. Musculoskeletal: Bilateral L5 pars defects, without malalignment. There is degenerate disc disease at L1-2. IMPRESSION: 1. Resolution of left-sided hydronephrosis since 05/13/2018. Mild right-sided hydroureteronephrosis and periureteric edema is new since 05/13/2018. Subtle hyperattenuation in the region of the right ureterovesicular  junction could represent blood products. Alternatively, this hydroureteronephrosis could be treatment related or secondary to recent stone passage. 2. Bilateral nephrolithiasis. 3. Possible constipation. 4. New trace cul-de-sac fluid, nonspecific but new since the prior. Electronically Signed   By: Abigail Miyamoto M.D.   On: 06/01/2018 23:57    Procedures Procedures  Medications Ordered in ED Medications - No data to display   Initial Impression / Assessment and Plan / ED Course  I have reviewed the triage vital signs and the nursing notes.  Pertinent labs & imaging results that were available during my care of the patient were reviewed by me and considered in my medical decision making (see chart for details).  Right flank pain following stone removal.  Old records are reviewed confirming right ureteroscopy with laser lithotripsy 2 days ago.  She also had  similar procedure on the left on December 7.  She should not have residual calculi to cause ureteral colic, but could conceivably have blood clot causing ureteral obstruction.  Will send for renal stone protocol CT scan.  Since pain has subsided, she has not given additional pain medication.   CT shows right-sided hydronephrosis with possible clot at the right UV junction.  I have gone back to explain this to the patient, and she actually states that her pain has not returned.  I suspect that the clot has passed and she likely will not have additional problems.  Case was discussed with Dr. Lovena Neighbours, on-call for urology, who recommended she be placed back on tamsulosin and take over-the-counter NSAIDs.  She should return to the ED only if pain is not able to be controlled at home or if she develops fever.  I have explained this to the patient.  She is to keep her scheduled follow-up appointment with the urologist who did her procedure, as scheduled.  Of note, labs do show mild anemia and mild elevation of lipase of doubtful clinical  significance.  Final Clinical Impressions(s) / ED Diagnoses   Final diagnoses:  Ureteral colic  Elevated lipase  Normochromic normocytic anemia    ED Discharge Orders         Ordered    phenazopyridine (PYRIDIUM) 200 MG tablet  3 times daily PRN,   Status:  Discontinued     06/02/18 0158    tamsulosin (FLOMAX) 0.4 MG CAPS capsule  Daily     06/02/18 7096           Delora Fuel, MD 43/83/81 [phone]

## 2018-06-02 LAB — CBC WITH DIFFERENTIAL/PLATELET
Abs Immature Granulocytes: 0.04 10*3/uL (ref 0.00–0.07)
Basophils Absolute: 0 10*3/uL (ref 0.0–0.1)
Basophils Relative: 0 %
Eosinophils Absolute: 0 10*3/uL (ref 0.0–0.5)
Eosinophils Relative: 0 %
HCT: 34.9 % — ABNORMAL LOW (ref 36.0–46.0)
Hemoglobin: 11.1 g/dL — ABNORMAL LOW (ref 12.0–15.0)
Immature Granulocytes: 0 %
Lymphocytes Relative: 15 %
Lymphs Abs: 1.3 10*3/uL (ref 0.7–4.0)
MCH: 29.9 pg (ref 26.0–34.0)
MCHC: 31.8 g/dL (ref 30.0–36.0)
MCV: 94.1 fL (ref 80.0–100.0)
Monocytes Absolute: 0.7 10*3/uL (ref 0.1–1.0)
Monocytes Relative: 8 %
Neutro Abs: 6.9 10*3/uL (ref 1.7–7.7)
Neutrophils Relative %: 77 %
Platelets: 227 10*3/uL (ref 150–400)
RBC: 3.71 MIL/uL — ABNORMAL LOW (ref 3.87–5.11)
RDW: 12.3 % (ref 11.5–15.5)
WBC: 9 10*3/uL (ref 4.0–10.5)
nRBC: 0 % (ref 0.0–0.2)

## 2018-06-02 LAB — URINALYSIS, ROUTINE W REFLEX MICROSCOPIC
Bilirubin Urine: NEGATIVE
Glucose, UA: NEGATIVE mg/dL
Ketones, ur: NEGATIVE mg/dL
Leukocytes, UA: NEGATIVE
Nitrite: POSITIVE — AB
Protein, ur: 30 mg/dL — AB
RBC / HPF: >50 RBC/hpf — ABNORMAL HIGH (ref 0–5)
Specific Gravity, Urine: 1.005 (ref 1.005–1.030)
pH: 8 (ref 5.0–8.0)

## 2018-06-02 LAB — COMPREHENSIVE METABOLIC PANEL
ALT: 20 U/L (ref 0–44)
AST: 20 U/L (ref 15–41)
Albumin: 4.1 g/dL (ref 3.5–5.0)
Alkaline Phosphatase: 46 U/L (ref 38–126)
Anion gap: 10 (ref 5–15)
BUN: 16 mg/dL (ref 6–20)
CO2: 26 mmol/L (ref 22–32)
Calcium: 8.9 mg/dL (ref 8.9–10.3)
Chloride: 105 mmol/L (ref 98–111)
Creatinine, Ser: 0.9 mg/dL (ref 0.44–1.00)
GFR calc Af Amer: >60 mL/min (ref >60)
GFR calc non Af Amer: >60 mL/min (ref >60)
Glucose, Bld: 106 mg/dL — ABNORMAL HIGH (ref 70–99)
Potassium: 3.6 mmol/L (ref 3.5–5.1)
Sodium: 141 mmol/L (ref 135–145)
Total Bilirubin: 0.7 mg/dL (ref 0.3–1.2)
Total Protein: 5.7 g/dL — ABNORMAL LOW (ref 6.5–8.1)

## 2018-06-02 LAB — LIPASE, BLOOD: Lipase: 74 U/L — ABNORMAL HIGH (ref 11–51)

## 2018-06-02 MED ORDER — TAMSULOSIN HCL 0.4 MG PO CAPS: 0.4000 mg | ORAL_CAPSULE | Freq: Every day | ORAL | 0 refills | Status: DC

## 2018-06-02 MED ORDER — PHENAZOPYRIDINE HCL 200 MG PO TABS: 200.0000 mg | ORAL_TABLET | Freq: Three times a day (TID) | ORAL | 0 refills | Status: DC | PRN

## 2018-06-02 NOTE — Discharge Instructions (Addendum)
Continue taking ibuprofen and tamsulosin. Return if pain is not being adequately controlled at home, or if you start running a fever.

## 2018-06-03 DIAGNOSIS — R8271 Bacteriuria: Secondary | ICD-10-CM | POA: Diagnosis not present

## 2018-06-03 DIAGNOSIS — N201 Calculus of ureter: Secondary | ICD-10-CM | POA: Diagnosis not present

## 2018-06-04 DIAGNOSIS — N201 Calculus of ureter: Secondary | ICD-10-CM | POA: Diagnosis not present

## 2018-07-11 DIAGNOSIS — N200 Calculus of kidney: Secondary | ICD-10-CM | POA: Diagnosis not present

## 2018-08-12 DIAGNOSIS — N202 Calculus of kidney with calculus of ureter: Secondary | ICD-10-CM | POA: Diagnosis not present

## 2018-08-19 ENCOUNTER — Encounter: Payer: Self-pay | Admitting: Family Medicine

## 2018-08-19 DIAGNOSIS — N200 Calculus of kidney: Secondary | ICD-10-CM | POA: Diagnosis not present

## 2018-08-22 ENCOUNTER — Other Ambulatory Visit: Payer: 59

## 2018-08-23 DIAGNOSIS — N200 Calculus of kidney: Secondary | ICD-10-CM | POA: Diagnosis not present

## 2018-08-26 DIAGNOSIS — N200 Calculus of kidney: Secondary | ICD-10-CM | POA: Diagnosis not present

## 2018-08-26 LAB — TSH: TSH: 0.98 (ref 0.41–5.90)

## 2018-08-29 ENCOUNTER — Encounter: Payer: Self-pay | Admitting: Family Medicine

## 2018-08-30 ENCOUNTER — Other Ambulatory Visit: Payer: Self-pay

## 2018-08-30 MED ORDER — THYROID 60 MG PO TABS: 60.0000 mg | ORAL_TABLET | Freq: Every day | ORAL | 3 refills | Status: DC

## 2018-08-30 MED ORDER — THYROID 90 MG PO TABS: 90.0000 mg | ORAL_TABLET | Freq: Every day | ORAL | 3 refills | Status: DC

## 2018-11-05 ENCOUNTER — Other Ambulatory Visit: Payer: Self-pay | Admitting: Family Medicine

## 2018-11-27 ENCOUNTER — Encounter: Payer: Self-pay | Admitting: Family Medicine

## 2019-03-10 ENCOUNTER — Encounter: Payer: Self-pay | Admitting: Family Medicine

## 2019-03-10 NOTE — Telephone Encounter (Signed)
Please advise. Should this be an in office visit?

## 2019-03-10 NOTE — Telephone Encounter (Signed)
Spoke with patient at some length.  She does have some redness and mild discomfort but no fevers or chills.  She thinks the swelling is gone down slightly.  I expressed my concern whether this could be a cellulitis.  She will set up follow-up tomorrow in office if this is not continuing to improve

## 2019-03-11 ENCOUNTER — Other Ambulatory Visit: Payer: Self-pay

## 2019-03-11 ENCOUNTER — Ambulatory Visit (HOSPITAL_COMMUNITY)
Admission: RE | Admit: 2019-03-11 | Discharge: 2019-03-11 | Disposition: A | Discharge status: Home / Self Care | Payer: 59 | Source: Ambulatory Visit | Attending: Family Medicine | Admitting: Family Medicine

## 2019-03-11 ENCOUNTER — Encounter: Payer: Self-pay | Admitting: Family Medicine

## 2019-03-11 ENCOUNTER — Ambulatory Visit (INDEPENDENT_AMBULATORY_CARE_PROVIDER_SITE_OTHER): Payer: 59 | Admitting: Family Medicine

## 2019-03-11 VITALS — BP 120/62 | HR 68 | Temp 97.7°F | Wt 142.6 lb

## 2019-03-11 DIAGNOSIS — M79601 Pain in right arm: Secondary | ICD-10-CM

## 2019-03-11 DIAGNOSIS — R600 Localized edema: Secondary | ICD-10-CM

## 2019-03-11 NOTE — Progress Notes (Signed)
Subjective:     Patient ID: Sherry Williams, female   DOB: 1960/09/23, 58 y.o.   MRN: [id number]  HPI Patient called yesterday with some right upper extremity swelling and she also noted a little bit of pain around the elbow region.  No injury.  She has some very faint erythema.  Our initial concern was whether she had cellulitis.  She does have prior history of bilateral lower extremity DVT back in 2017 along with pulmonary embolus.  She was taken off anticoagulation by hematology in 2018.  She stays very active.  She has not had any recent dyspnea or pleuritic chest pain.  No recent IV or blood draws in the right upper extremity.  Non-smoker.  No hormonal therapy.  Past Medical History:  Diagnosis Date  . Allergy   . DVT (deep venous thrombosis) ([place])    lower extremity   . Endometrial ca ([place]) 03/14/2017  . Family history of adverse reaction to anesthesia    children had N/V   . Hypothyroidism    Hashimoto  . Lichen sclerosus   . MRSA (methicillin resistant staph aureus) culture positive   . Pulmonary embolism ([place]) 01/13/2016   After car trip   Past Surgical History:  Procedure Laterality Date  . CYSTOSCOPY WITH RETROGRADE PYELOGRAM, URETEROSCOPY AND STENT PLACEMENT Left 05/18/2018   Procedure: CYSTOSCOPY WITH RETROGRADE PYELOGRAM, LEFT URETEROSCOPY AND LEFT URETERAL STENT PLACEMENT;  Surgeon: [name], MD;  [facility];  Service: Urology;  Laterality: Left;  . CYSTOSCOPY WITH RETROGRADE PYELOGRAM, URETEROSCOPY AND STENT PLACEMENT Right 05/30/2018   Procedure: CYSTOSCOPY WITH RIGHT RETROGRADE PYELOGRAM, URETEROSCOPY LASER LITHOTRIPSY AND STENT PLACEMENT;  Surgeon: [name], MD;  Location: [facility];  Service: Urology;  Laterality: Right;  . HOLMIUM LASER APPLICATION Left [id number]   Procedure: HOLMIUM LASER APPLICATION;  Surgeon: [name], MD;  [facility];  Service: Urology;  Laterality: Left;  . HOLMIUM LASER APPLICATION  Right [id number]   Procedure: HOLMIUM LASER APPLICATION;  Surgeon: [name], MD;  Location: [facility];  Service: Urology;  Laterality: Right;  . MOUTH SURGERY    . ROBOTIC ASSISTED TOTAL HYSTERECTOMY WITH BILATERAL SALPINGO OOPHERECTOMY Bilateral 04/17/2017   Procedure: XI ROBOTIC ASSISTED TOTAL HYSTERECTOMY WITH BILATERAL SALPINGO OOPHORECTOMY WITH SENTINAL LYMPH NODE BIOPSY AND POSSIBLE LYMPHADENECTOMY;  Surgeon: [name], MD;  [facility];  Service: Gynecology;  Laterality: Bilateral;    reports that she has never smoked. She has never used smokeless tobacco. She reports current alcohol use. She reports that she does not use drugs. family history includes Alzheimer's disease in her father; Arthritis in her mother; Breast cancer in her paternal grandmother; Deep vein thrombosis in her sister; Diabetes in her paternal grandfather; Hyperlipidemia in her father; Lung cancer in her maternal grandmother; Pancreatic cancer in her paternal grandfather; Stroke in her father. Allergies  Allergen Reactions  . Bee Venom Anaphylaxis  . Adhesive [Tape] Other (See Comments)    Irritation and red     Review of Systems  Constitutional: Negative for chills and fever.  Respiratory: Negative for shortness of breath.   Cardiovascular: Negative for chest pain and leg swelling.  Skin: Negative for rash.  Hematological: Negative for adenopathy.       Objective:   Physical Exam Constitutional:      Appearance: Normal appearance.  Cardiovascular:     Rate and Rhythm: Normal rate and regular rhythm.     Comments: Distal arterial pulses normal UEs.  Pulmonary:     Effort: Pulmonary effort is normal.     Breath sounds: Normal breath sounds.  Musculoskeletal:     Comments: She has some mild swelling right upper extremity compared to the left and this is most around the elbow region.  Very faint color changes with some pinkish discoloration around the medial aspect of  the elbow.  She has firmness to palpation of brachial vein.  Minimal warmth.  Full range of motion involving the right elbow  Neurological:     Mental Status: She is alert.        Assessment:     Acute swelling and mild pain right upper extremity.  Past history of DVT/PE.  Suspect this most likely represents superficial phlebitis or superficial vein thrombosis.  Rule out DVT    Plan:     -Venous Dopplers right upper extremity.  She is going straight to [facility] for those now -If negative, consider low-grade heating pad and consider low-dose aspirin 81 mg for the next few days -Follow-up immediately for any progressive upper extremity swelling or other concerns  Eulas Post MD [facility]

## 2019-03-11 NOTE — Patient Instructions (Signed)
We are setting up venous doppler of arm.    Follow up immediately for any chest pain or shortness of breath.

## 2019-03-11 NOTE — Progress Notes (Signed)
RUE venous duplex       has been completed. Preliminary results can be found under CV proc through chart review. June Leap, BS, RDMS, RVT    Called results to Dr. Elease Hashimoto.

## 2019-03-14 ENCOUNTER — Encounter: Payer: Self-pay | Admitting: Family Medicine

## 2019-04-04 ENCOUNTER — Telehealth (INDEPENDENT_AMBULATORY_CARE_PROVIDER_SITE_OTHER): Payer: 59 | Admitting: Family Medicine

## 2019-04-04 ENCOUNTER — Other Ambulatory Visit: Payer: 59

## 2019-04-04 ENCOUNTER — Other Ambulatory Visit: Payer: Self-pay

## 2019-04-04 DIAGNOSIS — M79672 Pain in left foot: Secondary | ICD-10-CM

## 2019-04-04 NOTE — Progress Notes (Signed)
Virtual Visit via Video Note  I connected with Sherry Williams on 04/04/19 at 11:30 AM EDT by a video enabled telemedicine application 2/2 [id number] pandemic and verified that I am speaking with the correct person using two identifiers.  Location patient: home Location provider:work or home office Persons participating in the virtual visit: patient, provider  I discussed the limitations of evaluation and management by telemedicine and the availability of in person appointments. The patient expressed understanding and agreed to proceed.   HPI: Pt is a 58 yo female with pmh sig for DVT, PE, nephrolitiasis, h/o endometrial cancer seen by Dr. Elease Hashimoto.  Pt seen today for acute concern, sore spot on top of L foot x 1.5 wks.  States hurts when walks.  Pain is a 5-7/10, at times throbbing in top of foot straight through.  Pt denies firmness to the sore area.  Pt cannot recall injury to the foot.  Was hurting prior to a recent beach trip.  Pt does note increased walking during trip.  Pt has a h/o of superficial clots. Concerned she has a clot in her foot. Pt is not on ASA or a blood thinner.  Pt denies pain in calves with dorsiflexion.  ROS: See pertinent positives and negatives per HPI.  Past Medical History:  Diagnosis Date  . Allergy   . DVT (deep venous thrombosis) ([place])    lower extremity   . Endometrial ca ([place]) 03/14/2017  . Family history of adverse reaction to anesthesia    children had N/V   . Hypothyroidism    Hashimoto  . Lichen sclerosus   . MRSA (methicillin resistant staph aureus) culture positive   . Pulmonary embolism ([place]) 01/13/2016   After car trip    Past Surgical History:  Procedure Laterality Date  . CYSTOSCOPY WITH RETROGRADE PYELOGRAM, URETEROSCOPY AND STENT PLACEMENT Left 05/18/2018   Procedure: CYSTOSCOPY WITH RETROGRADE PYELOGRAM, LEFT URETEROSCOPY AND LEFT URETERAL STENT PLACEMENT;  Surgeon: [name], MD;  [facility];  Service: Urology;  Laterality:  Left;  . CYSTOSCOPY WITH RETROGRADE PYELOGRAM, URETEROSCOPY AND STENT PLACEMENT Right 05/30/2018   Procedure: CYSTOSCOPY WITH RIGHT RETROGRADE PYELOGRAM, URETEROSCOPY LASER LITHOTRIPSY AND STENT PLACEMENT;  Surgeon: [name], MD;  Location: [facility];  Service: Urology;  Laterality: Right;  . HOLMIUM LASER APPLICATION Left [id number]   Procedure: HOLMIUM LASER APPLICATION;  Surgeon: [name], MD;  [facility];  Service: Urology;  Laterality: Left;  . HOLMIUM LASER APPLICATION Right [id number]   Procedure: HOLMIUM LASER APPLICATION;  Surgeon: [name], MD;  Location: [facility];  Service: Urology;  Laterality: Right;  . MOUTH SURGERY    . ROBOTIC ASSISTED TOTAL HYSTERECTOMY WITH BILATERAL SALPINGO OOPHERECTOMY Bilateral 04/17/2017   Procedure: XI ROBOTIC ASSISTED TOTAL HYSTERECTOMY WITH BILATERAL SALPINGO OOPHORECTOMY WITH SENTINAL LYMPH NODE BIOPSY AND POSSIBLE LYMPHADENECTOMY;  Surgeon: [name], MD;  Location: [facility];  Service: Gynecology;  Laterality: Bilateral;    Family History  Problem Relation Age of Onset  . Stroke Father   . Alzheimer's disease Father   . Hyperlipidemia Father   . Deep vein thrombosis Sister   . Breast cancer Paternal Grandmother        In her 35s, but lived to be very elderly  . Arthritis Mother   . Lung cancer Maternal Grandmother   . Diabetes Paternal Grandfather   . Pancreatic cancer Paternal Grandfather      Current Outpatient Medications:  .  ARMOUR THYROID 60 MG  tablet, TAKE 1 TABLET BY MOUTH EVERY DAY BEFORE BREAKFAST. TAKE 1 DAILY 5 DAYS PER WEEK, Disp: 90 tablet, Rfl: 2 .  HOMEOPATHIC PRODUCTS PO, 3 (three) times daily. heparsulphurouscalc, Disp: , Rfl:  .  thyroid (ARMOUR) 90 MG tablet, Take 1 tablet (90 mg total) by mouth daily. Take one daily two days per week, Disp: 90 tablet, Rfl: 3  EXAM:  VITALS per patient if applicable: RR between 123456 bpm  GENERAL: alert,  oriented, appears well and in no acute distress  HEENT: atraumatic, conjunctiva clear, no obvious abnormalities on inspection of external nose and ears  NECK: normal movements of the head and neck  LUNGS: on inspection no signs of respiratory distress, breathing rate appears normal, no obvious gross SOB, gasping or wheezing  CV: no obvious cyanosis  MS: Dorsum of L foot with mild area of edema, no erythema or laceration.  High arches noted.  Moves all visible extremities without noticeable abnormality.  Negative Homan's sign.    PSYCH/NEURO: pleasant and cooperative, no obvious depression or anxiety, speech and thought processing grossly intact.  Normal gait.  ASSESSMENT AND PLAN:  Discussed the following assessment and plan:  Left foot pain -discussed possible causes including muscle strain 2/2 lack of arch support, superficial blood clot, stress fx -Offered imaging, however pt declines.  Expresses concern about cost (recent u/s of arm $600) -discussed supportive care: rest, ice, heat, massage, elevation, tylenol or NSAIDs.  Ok to use brace. -discussed re-evaluation on Monday.  Consider imaging for continued or worsening symptoms. -given precautions  F/u prn on Monday.   I discussed the assessment and treatment plan with the patient. The patient was provided an opportunity to ask questions and all were answered. The patient agreed with the plan and demonstrated an understanding of the instructions.   The patient was advised to call back or seek an in-person evaluation if the symptoms worsen or if the condition fails to improve as anticipated.   Billie Ruddy, MD

## 2019-04-07 ENCOUNTER — Other Ambulatory Visit: Payer: Self-pay

## 2019-04-07 ENCOUNTER — Ambulatory Visit (HOSPITAL_COMMUNITY)
Admission: RE | Admit: 2019-04-07 | Discharge: 2019-04-07 | Disposition: A | Discharge status: Home / Self Care | Payer: 59 | Source: Ambulatory Visit | Attending: Family Medicine | Admitting: Family Medicine

## 2019-04-07 ENCOUNTER — Telehealth (INDEPENDENT_AMBULATORY_CARE_PROVIDER_SITE_OTHER): Payer: 59 | Admitting: Family Medicine

## 2019-04-07 DIAGNOSIS — M79662 Pain in left lower leg: Secondary | ICD-10-CM | POA: Diagnosis not present

## 2019-04-07 NOTE — Progress Notes (Signed)
Lower extremity venous has been completed.   Preliminary results in CV Proc.   Abram Sander 04/07/2019 3:35 PM

## 2019-04-07 NOTE — Progress Notes (Signed)
Virtual Visit via Video Note  I connected with Sherry Williams on 04/07/19 at 11:00 AM EDT by a video enabled telemedicine application 2/2 [id number] pandemic and verified that I am speaking with the correct person using two identifiers.  Location patient: home Location provider:work or home office Persons participating in the virtual visit: patient, provider  I discussed the limitations of evaluation and management by telemedicine and the availability of in person appointments. The patient expressed understanding and agreed to proceed.   HPI: Pt seen for f/u on virtual visit 10/23 for L foot pain.  Pt states she felt better on Fri/Sat after soaking her L foot.  Pt states the area of edema and pain in L foot resolved, but she has since developed pain and edema in her L calf.  Pt denies palpitations, CP, SOB, fever, chills.  ROS: See pertinent positives and negatives per HPI.  Past Medical History:  Diagnosis Date  . Allergy   . DVT (deep venous thrombosis) ([place])    lower extremity   . Endometrial ca ([place]) 03/14/2017  . Family history of adverse reaction to anesthesia    children had N/V   . Hypothyroidism    Hashimoto  . Lichen sclerosus   . MRSA (methicillin resistant staph aureus) culture positive   . Pulmonary embolism ([place]) 01/13/2016   After car trip    Past Surgical History:  Procedure Laterality Date  . CYSTOSCOPY WITH RETROGRADE PYELOGRAM, URETEROSCOPY AND STENT PLACEMENT Left 05/18/2018   Procedure: CYSTOSCOPY WITH RETROGRADE PYELOGRAM, LEFT URETEROSCOPY AND LEFT URETERAL STENT PLACEMENT;  Surgeon: [name], MD;  Location: [facility];  Service: Urology;  Laterality: Left;  . CYSTOSCOPY WITH RETROGRADE PYELOGRAM, URETEROSCOPY AND STENT PLACEMENT Right 05/30/2018   Procedure: CYSTOSCOPY WITH RIGHT RETROGRADE PYELOGRAM, URETEROSCOPY LASER LITHOTRIPSY AND STENT PLACEMENT;  Surgeon: [name], MD;  Location: [facility];  Service: Urology;   Laterality: Right;  . HOLMIUM LASER APPLICATION Left [id number]   Procedure: HOLMIUM LASER APPLICATION;  Surgeon: [name], MD;  Location: [facility];  Service: Urology;  Laterality: Left;  . HOLMIUM LASER APPLICATION Right [id number]   Procedure: HOLMIUM LASER APPLICATION;  Surgeon: [name], MD;  Location: [facility];  Service: Urology;  Laterality: Right;  . MOUTH SURGERY    . ROBOTIC ASSISTED TOTAL HYSTERECTOMY WITH BILATERAL SALPINGO OOPHERECTOMY Bilateral 04/17/2017   Procedure: XI ROBOTIC ASSISTED TOTAL HYSTERECTOMY WITH BILATERAL SALPINGO OOPHORECTOMY WITH SENTINAL LYMPH NODE BIOPSY AND POSSIBLE LYMPHADENECTOMY;  Surgeon: [name], MD;  Location: [facility];  Service: Gynecology;  Laterality: Bilateral;    Family History  Problem Relation Age of Onset  . Stroke Father   . Alzheimer's disease Father   . Hyperlipidemia Father   . Deep vein thrombosis Sister   . Breast cancer Paternal Grandmother        In her 38s, but lived to be very elderly  . Arthritis Mother   . Lung cancer Maternal Grandmother   . Diabetes Paternal Grandfather   . Pancreatic cancer Paternal Grandfather      Current Outpatient Medications:  .  ARMOUR THYROID 60 MG tablet, TAKE 1 TABLET BY MOUTH EVERY DAY BEFORE BREAKFAST. TAKE 1 DAILY 5 DAYS PER WEEK, Disp: 90 tablet, Rfl: 2 .  HOMEOPATHIC PRODUCTS PO, 3 (three) times daily. heparsulphurouscalc, Disp: , Rfl:  .  thyroid (ARMOUR) 90 MG tablet, Take 1 tablet (90 mg total) by mouth daily. Take one daily two days per week, Disp: 90 tablet, Rfl: 3  EXAM:  VITALS per patient if applicable:  GENERAL: alert, oriented, appears well and in no acute distress  HEENT: atraumatic, conjunctiva clear, no obvious abnormalities on inspection of external nose and ears  NECK: normal movements of the head and neck  LUNGS: on inspection no signs of respiratory distress, breathing rate appears normal, no obvious gross SOB, gasping or  wheezing  CV: no obvious cyanosis  MS: moves all visible extremities without noticeable abnormality  PSYCH/NEURO: pleasant and cooperative, no obvious depression or anxiety, speech and thought processing grossly intact  ASSESSMENT AND PLAN:  Discussed the following assessment and plan:  Pain of left calf  -concern for DVT.  Discussed need for STAT u/s LLE -given precaoutions - Plan: VAS [place] LOWER EXTREMITY VENOUS (DVT)  F/u prn  I discussed the assessment and treatment plan with the patient. The patient was provided an opportunity to ask questions and all were answered. The patient agreed with the plan and demonstrated an understanding of the instructions.   The patient was advised to call back or seek an in-person evaluation if the symptoms worsen or if the condition fails to improve as anticipated.  Billie Ruddy, MD

## 2019-04-18 ENCOUNTER — Encounter: Payer: Self-pay | Admitting: Family Medicine

## 2019-04-21 NOTE — Telephone Encounter (Signed)
I have the patient scheduled for 11/10 at 4 PM

## 2019-04-22 ENCOUNTER — Ambulatory Visit (INDEPENDENT_AMBULATORY_CARE_PROVIDER_SITE_OTHER): Payer: 59 | Admitting: Family Medicine

## 2019-04-22 ENCOUNTER — Encounter: Payer: Self-pay | Admitting: Family Medicine

## 2019-04-22 ENCOUNTER — Other Ambulatory Visit: Payer: Self-pay

## 2019-04-22 VITALS — BP 94/60 | HR 73 | Temp 98.7°F | Resp 16 | Ht 66.0 in | Wt 142.4 lb

## 2019-04-22 DIAGNOSIS — R590 Localized enlarged lymph nodes: Secondary | ICD-10-CM

## 2019-04-22 NOTE — Patient Instructions (Signed)
Let me know if node not gone or reducing in size in 2 weeks.

## 2019-04-22 NOTE — Progress Notes (Signed)
Subjective:     Patient ID: Sherry Williams, female   DOB: 1961-06-07, 58 y.o.   MRN: [id number]  HPI   Elianni is seen with concern for possible lymph node left neck supraclavicular region.  She first noticed this about a week ago.  She recently saw her gynecologist and had mammogram with some type of abnormality noted in the left breast.  She has pending diagnostic mammogram and ultrasound.  She has not noted any other adenopathy.  No fevers or chills.  No appetite or weight changes.  She has occasional hot flashes but no night sweats.  Generally feels well.  She has history of kidney stones, endometrial cancer, hypothyroidism, and past history of pulmonary embolus.  She had recent superficial clot in the right arm which is fully resolved with heat and aspirin use.  No abdominal bloating or any pelvic pain.  She has had previous total hysterectomy with bilateral salpingo-oophorectomy  Past Medical History:  Diagnosis Date  . Allergy   . DVT (deep venous thrombosis) ([place])    lower extremity   . Endometrial ca ([place]) 03/14/2017  . Family history of adverse reaction to anesthesia    children had N/V   . Hypothyroidism    Hashimoto  . Lichen sclerosus   . MRSA (methicillin resistant staph aureus) culture positive   . Pulmonary embolism ([place]) 01/13/2016   After car trip   Past Surgical History:  Procedure Laterality Date  . CYSTOSCOPY WITH RETROGRADE PYELOGRAM, URETEROSCOPY AND STENT PLACEMENT Left 05/18/2018   Procedure: CYSTOSCOPY WITH RETROGRADE PYELOGRAM, LEFT URETEROSCOPY AND LEFT URETERAL STENT PLACEMENT;  Surgeon: [name], MD;  [facility];  Service: Urology;  Laterality: Left;  . CYSTOSCOPY WITH RETROGRADE PYELOGRAM, URETEROSCOPY AND STENT PLACEMENT Right 05/30/2018   Procedure: CYSTOSCOPY WITH RIGHT RETROGRADE PYELOGRAM, URETEROSCOPY LASER LITHOTRIPSY AND STENT PLACEMENT;  Surgeon: [name], MD;  Location: [facility];  Service: Urology;   Laterality: Right;  . HOLMIUM LASER APPLICATION Left [id number]   Procedure: HOLMIUM LASER APPLICATION;  Surgeon: [name], MD;  [facility];  Service: Urology;  Laterality: Left;  . HOLMIUM LASER APPLICATION Right [id number]   Procedure: HOLMIUM LASER APPLICATION;  Surgeon: [name], MD;  Location: [facility];  Service: Urology;  Laterality: Right;  . MOUTH SURGERY    . ROBOTIC ASSISTED TOTAL HYSTERECTOMY WITH BILATERAL SALPINGO OOPHERECTOMY Bilateral 04/17/2017   Procedure: XI ROBOTIC ASSISTED TOTAL HYSTERECTOMY WITH BILATERAL SALPINGO OOPHORECTOMY WITH SENTINAL LYMPH NODE BIOPSY AND POSSIBLE LYMPHADENECTOMY;  Surgeon: [name], MD;  [facility];  Service: Gynecology;  Laterality: Bilateral;    reports that she has never smoked. She has never used smokeless tobacco. She reports current alcohol use. She reports that she does not use drugs. family history includes Alzheimer's disease in her father; Arthritis in her mother; Breast cancer in her paternal grandmother; Deep vein thrombosis in her sister; Diabetes in her paternal grandfather; Hyperlipidemia in her father; Lung cancer in her maternal grandmother; Pancreatic cancer in her paternal grandfather; Stroke in her father. Allergies  Allergen Reactions  . Bee Venom Anaphylaxis  . Adhesive [Tape] Other (See Comments)    Irritation and red      Review of Systems  Constitutional: Negative for appetite change, chills, fatigue, fever and unexpected weight change.  Respiratory: Negative for cough and shortness of breath.   Cardiovascular: Negative for chest pain.  Gastrointestinal: Negative for abdominal pain, blood in stool, diarrhea, nausea and vomiting.  Genitourinary: Negative  for dysuria.  Hematological: Does not bruise/bleed easily.       Objective:   Physical Exam Vitals signs reviewed.  Constitutional:      Appearance: Normal appearance.  Neck:     Musculoskeletal: Neck  supple.     Comments: No adenopathy in the anterior or posterior cervical triangle right or left side.  About 1-1/2 cm above the clavicle and just left of the anterior cervical triangle she has approximately 1/2 x 1 cm mobile small node which is nontender.  This seems to be oval in shape and smooth edges.  No axillary adenopathy Cardiovascular:     Rate and Rhythm: Normal rate and regular rhythm.  Pulmonary:     Effort: Pulmonary effort is normal.     Breath sounds: Normal breath sounds.  Abdominal:     Palpations: Abdomen is soft.     Tenderness: There is no abdominal tenderness.  Neurological:     Mental Status: She is alert.        Assessment:     Probable very small node left supraclavicular region.  No other adenopathy noted.  Patient thinks this may have decreased some in size past couple days.  We did explain that supraclavicular adenopathy does have a much higher risk of being associated with malignancy and with left-sided involvement ("[place]" node) particularly have to look out for abdominal malignancy.  Fortunately, she is not any red flags such as appetite or weight changes, abdominal pain, fever, etc.    Plan:     -She will proceed with diagnostic mammogram and ultrasound as scheduled and we recommend 2 weeks observation.  If node is not decreased further in size or resolved in that time we will recommend consideration for biopsy/excision.  Eulas Post MD [facility]

## 2019-05-04 ENCOUNTER — Other Ambulatory Visit: Payer: Self-pay | Admitting: Family Medicine

## 2019-05-05 ENCOUNTER — Encounter: Payer: Self-pay | Admitting: Family Medicine

## 2019-05-05 DIAGNOSIS — R590 Localized enlarged lymph nodes: Secondary | ICD-10-CM

## 2019-05-05 NOTE — Telephone Encounter (Signed)
Spoke with patient.  Left supraclavicular node has persisted the past couple weeks.  Will set up referral for consideration for excisional biopsy

## 2019-05-11 ENCOUNTER — Encounter: Payer: Self-pay | Admitting: Family Medicine

## 2019-05-12 ENCOUNTER — Ambulatory Visit (INDEPENDENT_AMBULATORY_CARE_PROVIDER_SITE_OTHER): Payer: 59 | Admitting: Otolaryngology

## 2019-05-12 ENCOUNTER — Encounter (INDEPENDENT_AMBULATORY_CARE_PROVIDER_SITE_OTHER): Payer: Self-pay | Admitting: Otolaryngology

## 2019-05-12 VITALS — Temp 97.1°F

## 2019-05-12 DIAGNOSIS — R590 Localized enlarged lymph nodes: Secondary | ICD-10-CM | POA: Diagnosis not present

## 2019-05-12 NOTE — Progress Notes (Signed)
HPI: Sherry Williams is a 58 y.o. female who presents is referred by Dr. Elease Hashimoto for evaluation of left supraclavicular lymphadenopathy.  She just noticed this herself about a month ago.  She denies any illnesses.  She has had history of uterine cancer and status post hysterectomy for an early stage uterine cancer that was felt to be cured.  She denies any sore throat.  No sinus issues. She noticed the nodule in the lower left neck randomly as she was feeling her neck at 1 point.  It has not been tender. She is referred by Dr. Elease Hashimoto for further evaluation of the left neck supraclavicular lymphadenopathy. She does not smoke.Marland Kitchen  Past Medical History:  Diagnosis Date  . Allergy   . DVT (deep venous thrombosis) ([place])    lower extremity   . Endometrial ca ([place]) 03/14/2017  . Family history of adverse reaction to anesthesia    children had N/V   . Hypothyroidism    Hashimoto  . Lichen sclerosus   . MRSA (methicillin resistant staph aureus) culture positive   . Pulmonary embolism ([place]) 01/13/2016   After car trip   Past Surgical History:  Procedure Laterality Date  . CYSTOSCOPY WITH RETROGRADE PYELOGRAM, URETEROSCOPY AND STENT PLACEMENT Left 05/18/2018   Procedure: CYSTOSCOPY WITH RETROGRADE PYELOGRAM, LEFT URETEROSCOPY AND LEFT URETERAL STENT PLACEMENT;  Surgeon: [name], MD;  Location[facility];  Service: Urology;  Laterality: Left;  . CYSTOSCOPY WITH RETROGRADE PYELOGRAM, URETEROSCOPY AND STENT PLACEMENT Right 05/30/2018   Procedure: CYSTOSCOPY WITH RIGHT RETROGRADE PYELOGRAM, URETEROSCOPY LASER LITHOTRIPSY AND STENT PLACEMENT;  Surgeon: [name], MD;  Location: [facility];  Service: Urology;  Laterality: Right;  . HOLMIUM LASER APPLICATION Left [id number]   Procedure: HOLMIUM LASER APPLICATION;  Surgeon: [name], MD;  Location[facility];  Service: Urology;  Laterality: Left;  . HOLMIUM LASER APPLICATION Right [id number]   Procedure:  HOLMIUM LASER APPLICATION;  Surgeon: [name], MD;  Location: [facility];  Service: Urology;  Laterality: Right;  . MOUTH SURGERY    . ROBOTIC ASSISTED TOTAL HYSTERECTOMY WITH BILATERAL SALPINGO OOPHERECTOMY Bilateral 04/17/2017   Procedure: XI ROBOTIC ASSISTED TOTAL HYSTERECTOMY WITH BILATERAL SALPINGO OOPHORECTOMY WITH SENTINAL LYMPH NODE BIOPSY AND POSSIBLE LYMPHADENECTOMY;  Surgeon: [name], MD;  Location: [facility];  Service: Gynecology;  Laterality: Bilateral;   Social History   Socioeconomic History  . Marital status: Married    Spouse name: Not on file  . Number of children: Not on file  . Years of education: Not on file  . Highest education level: Not on file  Occupational History  . Not on file  Social Needs  . Financial resource strain: Not on file  . Food insecurity    Worry: Not on file    Inability: Not on file  . Transportation needs    Medical: Not on file    Non-medical: Not on file  Tobacco Use  . Smoking status: Never Smoker  . Smokeless tobacco: Never Used  Substance and Sexual Activity  . Alcohol use: Yes    Comment: occassional  . Drug use: No  . Sexual activity: Yes  Lifestyle  . Physical activity    Days per week: Not on file    Minutes per session: Not on file  . Stress: Not on file  Relationships  . Social Herbalist on phone: Not on file    Gets together: Not on file    Attends  religious service: Not on file    Active member of club or organization: Not on file    Attends meetings of clubs or organizations: Not on file    Relationship status: Not on file  Other Topics Concern  . Not on file  Social History Narrative  . Not on file   Family History  Problem Relation Age of Onset  . Stroke Father   . Alzheimer's disease Father   . Hyperlipidemia Father   . Deep vein thrombosis Sister   . Breast cancer Paternal Grandmother        In her 67s, but lived to be very elderly  . Arthritis Mother   .  Lung cancer Maternal Grandmother   . Diabetes Paternal Grandfather   . Pancreatic cancer Paternal Grandfather    Allergies  Allergen Reactions  . Bee Venom Anaphylaxis  . Adhesive [Tape] Other (See Comments)    Irritation and red   Prior to Admission medications   Medication Sig Start Date End Date Taking? Authorizing Provider  ARMOUR THYROID 60 MG tablet TAKE 1 TABLET BY MOUTH EVERY DAY BEFORE BREAKFAST. TAKE 1 DAILY 5 DAYS PER WEEK 11/05/18  Yes Burchette, Alinda Sierras, MD  ARMOUR THYROID 90 MG tablet TAKE 1 TABLET BY MOUTH EVERY DAY TWO DAYS PER WEEK 05/05/19  Yes Burchette, Alinda Sierras, MD     Positive ROS: Otherwise negative  All other systems have been reviewed and were otherwise negative with the exception of those mentioned in the HPI and as above.  Physical Exam: Constitutional: Alert, well-appearing, no acute distress Ears: External ears without lesions or tenderness. Ear canals are clear bilaterally with intact, clear TMs.  Nasal: External nose without lesions. Septum relatively midline. Clear nasal passages with no signs of infection. Oral: Lips and gums without lesions. Tongue and palate mucosa without lesions. Posterior oropharynx clear.  Tonsils are small benign and symmetric.  Indirect laryngoscopy revealed a clear base of tongue, vallecula and epiglottis.  Vocal cords are normal bilaterally Neck: Patient has a small 7 to 8 mm mobile nodule consistent with lymphadenopathy in the lower left neck just to the left and posterior to the sternocleidomastoid muscle.  No other supraclavicular adenopathy noted.  No palpable thyroid nodules noted.  No notable adenopathy more superiorly within the neck on either side. Respiratory: Breathing comfortably  Skin: No facial/neck lesions or rash noted.  Procedures  Assessment: Left neck lymphadenopathy measuring approximately 7 to 8 mm and is mobile is more consistent with benign lymphadenopathy at present clinical evaluation.  Plan: She  will follow-up in 6 weeks for recheck.  She will return earlier if she notices any enlargement of the lymph node.   Radene Journey, MD   CC:

## 2019-05-19 ENCOUNTER — Telehealth: Payer: Self-pay | Admitting: *Deleted

## 2019-05-19 NOTE — Telephone Encounter (Signed)
Called and scheduled the patient for a follow up in April

## 2019-05-29 ENCOUNTER — Encounter: Payer: Self-pay | Admitting: Family Medicine

## 2019-05-29 DIAGNOSIS — M79606 Pain in leg, unspecified: Secondary | ICD-10-CM

## 2019-06-02 ENCOUNTER — Telehealth: Payer: Self-pay | Admitting: *Deleted

## 2019-06-02 NOTE — Telephone Encounter (Signed)
Copied from [place] [id number]. Topic: Quick Communication - See Telephone Encounter >> Jun 02, 2019 10:48 AM Loma [place] wrote: CRM for notification. See Telephone encounter for: 06/02/19. Pt left messages thru the week end and she is anxious [phone] did not want to disclose further just to expedite.

## 2019-06-02 NOTE — Telephone Encounter (Signed)
Let her know I have ordered bilateral venous dopplers of LEs.  I do think it is time to check to rule out DVT.

## 2019-06-02 NOTE — Telephone Encounter (Signed)
Patient has been communicating through her MyChart about this message.

## 2019-06-03 ENCOUNTER — Other Ambulatory Visit: Payer: Self-pay

## 2019-06-03 ENCOUNTER — Ambulatory Visit (HOSPITAL_COMMUNITY)
Admission: RE | Admit: 2019-06-03 | Discharge: 2019-06-03 | Disposition: A | Discharge status: Home / Self Care | Payer: 59 | Source: Ambulatory Visit | Attending: Cardiology | Admitting: Cardiology

## 2019-06-03 ENCOUNTER — Other Ambulatory Visit (HOSPITAL_COMMUNITY): Payer: Self-pay | Admitting: Family Medicine

## 2019-06-03 ENCOUNTER — Encounter: Payer: Self-pay | Admitting: Family Medicine

## 2019-06-03 ENCOUNTER — Telehealth: Payer: Self-pay | Admitting: Family Medicine

## 2019-06-03 ENCOUNTER — Encounter (HOSPITAL_COMMUNITY): Payer: Self-pay

## 2019-06-03 DIAGNOSIS — M79669 Pain in unspecified lower leg: Secondary | ICD-10-CM | POA: Diagnosis not present

## 2019-06-03 DIAGNOSIS — M7989 Other specified soft tissue disorders: Secondary | ICD-10-CM | POA: Diagnosis present

## 2019-06-03 MED ORDER — RIVAROXABAN (XARELTO) VTE STARTER PACK (15 & 20 MG): ORAL_TABLET | ORAL | 0 refills | Status: DC

## 2019-06-03 NOTE — Progress Notes (Unsigned)
Bilateral lower venous has been completed and is positive for DVT. Preliminary report given to Dr. Elease Hashimoto and instructed patient that he will call with further instructions. Preliminary results can be found under CV proc through chart review.  Wilkie Aye RVT [facility]

## 2019-06-03 NOTE — Telephone Encounter (Signed)
Sherry Williams has history of DVT lower extremity.  She had onset of the weekend of some bilateral leg pain.  We sent her for Dopplers and received call this afternoon that she has bilateral DVTs extending to the popliteal vein on the right side.  She has no dyspnea or chest pains.  No pleuritic pain.  We discussed getting back on Xarelto 15 mg twice daily for 3 weeks then transition to 20 mg once daily.  She knows to start that tonight promptly.  She does not have any recent bleeding complications or contraindications.  Non-smoker.  We will plan on office follow-up in 1 month to reassess and sooner as needed

## 2019-06-03 NOTE — Telephone Encounter (Signed)
I have called her.  Will provide GoodRx card to see if this helps.

## 2019-06-10 ENCOUNTER — Telehealth (INDEPENDENT_AMBULATORY_CARE_PROVIDER_SITE_OTHER): Payer: 59 | Admitting: Family Medicine

## 2019-06-10 ENCOUNTER — Other Ambulatory Visit: Payer: Self-pay

## 2019-06-10 ENCOUNTER — Telehealth: Payer: Self-pay

## 2019-06-10 DIAGNOSIS — I82403 Acute embolism and thrombosis of unspecified deep veins of lower extremity, bilateral: Secondary | ICD-10-CM

## 2019-06-10 DIAGNOSIS — I82409 Acute embolism and thrombosis of unspecified deep veins of unspecified lower extremity: Secondary | ICD-10-CM | POA: Insufficient documentation

## 2019-06-10 MED ORDER — RIVAROXABAN 20 MG PO TABS: 20.0000 mg | ORAL_TABLET | Freq: Every day | ORAL | 1 refills | Status: DC

## 2019-06-10 NOTE — Progress Notes (Signed)
This visit type was conducted due to national recommendations for restrictions regarding the COVID-19 pandemic in an effort to limit this patient's exposure and mitigate transmission in our community.   Virtual Visit via Video Note  I connected with Sherry Williams on 06/10/19 at 11:45 AM EST by a video enabled telemedicine application and verified that I am speaking with the correct person using two identifiers.  Location patient: home Location provider:work or home office Persons participating in the virtual visit: patient, provider  I discussed the limitations of evaluation and management by telemedicine and the availability of in person appointments. The patient expressed understanding and agreed to proceed.   HPI: Sherry Williams had sent MyChart message yesterday with multiple questions and at that point virtual visit was set up to help answer some of her questions.  She had pulmonary emboli back in 2017 which were basically unprovoked.  She had minimally elevated protein S level but otherwise unremarkable blood screening.  She saw hematologist at that point.  She was treated for 6 months with anticoagulant.  She then had called back recently with some bilateral leg pain in the absence of significant swelling.  We ordered venous Dopplers and this showed bilateral DVT lower extremities.  She has not had any chest pain or shortness of breath with this episode.  We promptly started Xarelto which she had taken previously with prior event and she is currently on 15 mg twice daily for 3 weeks and then plan to transition to 20 mg daily.  She does have history of endometrial cancer and had total hysterectomy over a year ago.  She had her last colonoscopy about 10 years ago.  She is getting regular mammograms.  She denies any recent appetite or weight changes.  Her bilateral leg pain is stable.  She had some recent transient vertigo but no other major symptoms.  She does feel like she is having significant anxiety  symptoms  She is a non-smoker.  No hormonal use.  She has hypothyroidism and is on replacement.  No known family history of DVT.  She is generally very active and swims regularly until this most recent event.  She had questions regarding when she could return back to activity such as walking and swimming.  She had questions regarding duration of therapy.   ROS: See pertinent positives and negatives per HPI.  Past Medical History:  Diagnosis Date  . Allergy   . DVT (deep venous thrombosis) ([place])    lower extremity   . Endometrial ca ([place]) 03/14/2017  . Family history of adverse reaction to anesthesia    children had N/V   . Hypothyroidism    Hashimoto  . Lichen sclerosus   . MRSA (methicillin resistant staph aureus) culture positive   . Pulmonary embolism ([place]) 01/13/2016   After car trip    Past Surgical History:  Procedure Laterality Date  . CYSTOSCOPY WITH RETROGRADE PYELOGRAM, URETEROSCOPY AND STENT PLACEMENT Left 05/18/2018   Procedure: CYSTOSCOPY WITH RETROGRADE PYELOGRAM, LEFT URETEROSCOPY AND LEFT URETERAL STENT PLACEMENT;  Surgeon: [name], MD;  Location: [facility];  Service: Urology;  Laterality: Left;  . CYSTOSCOPY WITH RETROGRADE PYELOGRAM, URETEROSCOPY AND STENT PLACEMENT Right 05/30/2018   Procedure: CYSTOSCOPY WITH RIGHT RETROGRADE PYELOGRAM, URETEROSCOPY LASER LITHOTRIPSY AND STENT PLACEMENT;  Surgeon: [name], MD;  Location: [facility];  Service: Urology;  Laterality: Right;  . HOLMIUM LASER APPLICATION Left [id number]   Procedure: HOLMIUM LASER APPLICATION;  Surgeon: [name], MD;  Location: [facility];  Service: Urology;  Laterality: Left;  . HOLMIUM LASER APPLICATION Right [id number]   Procedure: HOLMIUM LASER APPLICATION;  Surgeon: [name], MD;  Location: [facility];  Service: Urology;  Laterality: Right;  . MOUTH SURGERY    . ROBOTIC ASSISTED TOTAL HYSTERECTOMY WITH BILATERAL SALPINGO  OOPHERECTOMY Bilateral 04/17/2017   Procedure: XI ROBOTIC ASSISTED TOTAL HYSTERECTOMY WITH BILATERAL SALPINGO OOPHORECTOMY WITH SENTINAL LYMPH NODE BIOPSY AND POSSIBLE LYMPHADENECTOMY;  Surgeon: [name], MD;  Location: [facility];  Service: Gynecology;  Laterality: Bilateral;    Family History  Problem Relation Age of Onset  . Stroke Father   . Alzheimer's disease Father   . Hyperlipidemia Father   . Deep vein thrombosis Sister   . Breast cancer Paternal Grandmother        In her 40s, but lived to be very elderly  . Arthritis Mother   . Lung cancer Maternal Grandmother   . Diabetes Paternal Grandfather   . Pancreatic cancer Paternal Grandfather     SOCIAL HX: Non-smoker   Current Outpatient Medications:  .  ARMOUR THYROID 60 MG tablet, TAKE 1 TABLET BY MOUTH EVERY DAY BEFORE BREAKFAST. TAKE 1 DAILY 5 DAYS PER WEEK, Disp: 90 tablet, Rfl: 2 .  ARMOUR THYROID 90 MG tablet, TAKE 1 TABLET BY MOUTH EVERY DAY TWO DAYS PER WEEK, Disp: 90 tablet, Rfl: 1 .  Rivaroxaban 15 & 20 MG TBPK, Follow package directions: Take one 15mg  tablet by mouth twice a day. On day 22, switch to one 20mg  tablet once a day. Take with food., Disp: 51 each, Rfl: 0  EXAM:  VITALS per patient if applicable:  GENERAL: alert, oriented, appears well and in no acute distress  HEENT: atraumatic, conjunttiva clear, no obvious abnormalities on inspection of external nose and ears  NECK: normal movements of the head and neck  LUNGS: on inspection no signs of respiratory distress, breathing rate appears normal, no obvious gross SOB, gasping or wheezing  CV: no obvious cyanosis  MS: moves all visible extremities without noticeable abnormality  PSYCH/NEURO: pleasant and cooperative, no obvious depression or anxiety, speech and thought processing grossly intact  ASSESSMENT AND PLAN:  Discussed the following assessment and plan:  Recent acute DVT bilateral lower extremities with past history of pulmonary embolus.   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  We discussed several issues as follows  -She will very likely need to be on lifelong anticoagulation therapy.  One consideration would be whether she could reduce Xarelto dose down to perhaps 10 mg daily at the end of 3 months for acute treatment/prevention -We recommend she reconsult with her hematologist Dr. Marin Olp near the end of this 76-month period to get his guidance on that -We discussed repeat colonoscopy and she would like to do Cologuard instead since she has not had previous polyps.  She is aware of potential deficiencies of Cologuard screening versus colonoscopy -We will send in Xarelto 20 mg to start 1 daily at the end of her 3-week 15 mg twice daily dosing. -She will resume her swimming and walking as tolerated    I discussed the assessment and treatment plan with the patient. The patient was provided an opportunity to ask questions and all were answered. The patient agreed with the plan and demonstrated an understanding of the instructions.   The patient was advised to call back or seek an in-person evaluation if the symptoms worsen or if the condition fails to improve as anticipated.    Darnell Level  Elease Hashimoto, MD

## 2019-06-10 NOTE — Telephone Encounter (Signed)
Faxed cologuard request order form to [phone]

## 2019-06-12 ENCOUNTER — Telehealth: Payer: Self-pay | Admitting: Hematology & Oncology

## 2019-06-12 NOTE — Telephone Encounter (Signed)
lmom to inform patient of new patient appt 3/1 at 1030 am per 12/30 referral

## 2019-06-18 ENCOUNTER — Encounter: Payer: Self-pay | Admitting: Family Medicine

## 2019-06-23 ENCOUNTER — Ambulatory Visit (INDEPENDENT_AMBULATORY_CARE_PROVIDER_SITE_OTHER): Payer: 59 | Admitting: Otolaryngology

## 2019-06-24 LAB — COLOGUARD

## 2019-06-27 ENCOUNTER — Ambulatory Visit (INDEPENDENT_AMBULATORY_CARE_PROVIDER_SITE_OTHER): Payer: 59 | Admitting: Otolaryngology

## 2019-07-01 ENCOUNTER — Ambulatory Visit (INDEPENDENT_AMBULATORY_CARE_PROVIDER_SITE_OTHER): Payer: 59 | Admitting: Otolaryngology

## 2019-07-01 ENCOUNTER — Other Ambulatory Visit: Payer: Self-pay

## 2019-07-01 ENCOUNTER — Encounter (INDEPENDENT_AMBULATORY_CARE_PROVIDER_SITE_OTHER): Payer: Self-pay | Admitting: Otolaryngology

## 2019-07-01 VITALS — Temp 98.2°F

## 2019-07-01 DIAGNOSIS — R590 Localized enlarged lymph nodes: Secondary | ICD-10-CM

## 2019-07-01 NOTE — Progress Notes (Signed)
HPI: Sherry Williams is a 59 y.o. female who returns today for evaluation of left neck lymphadenopathy.  Since last visit patient was diagnosed with bilateral DVTs of her calves and has been started on Xarelto.  She has had a previous history of DVTs in the past as well as a PE. She has not noted any discomfort in her neck.  When she feels her neck she feels several different bumps but is not sure what she is feeling..  Past Medical History:  Diagnosis Date  . Allergy   . DVT (deep venous thrombosis) ([place])    lower extremity   . Endometrial ca ([place]) 03/14/2017  . Family history of adverse reaction to anesthesia    children had N/V   . Hypothyroidism    Hashimoto  . Lichen sclerosus   . MRSA (methicillin resistant staph aureus) culture positive   . Pulmonary embolism ([place]) 01/13/2016   After car trip   Past Surgical History:  Procedure Laterality Date  . CYSTOSCOPY WITH RETROGRADE PYELOGRAM, URETEROSCOPY AND STENT PLACEMENT Left 05/18/2018   Procedure: CYSTOSCOPY WITH RETROGRADE PYELOGRAM, LEFT URETEROSCOPY AND LEFT URETERAL STENT PLACEMENT;  Surgeon: [name], MD;  Location: [facility];  Service: Urology;  Laterality: Left;  . CYSTOSCOPY WITH RETROGRADE PYELOGRAM, URETEROSCOPY AND STENT PLACEMENT Right 05/30/2018   Procedure: CYSTOSCOPY WITH RIGHT RETROGRADE PYELOGRAM, URETEROSCOPY LASER LITHOTRIPSY AND STENT PLACEMENT;  Surgeon: [name], MD;  Location: [facility];  Service: Urology;  Laterality: Right;  . HOLMIUM LASER APPLICATION Left [id number]   Procedure: HOLMIUM LASER APPLICATION;  Surgeon: [name], MD;  Location: [facility];  Service: Urology;  Laterality: Left;  . HOLMIUM LASER APPLICATION Right [id number]   Procedure: HOLMIUM LASER APPLICATION;  Surgeon: [name], MD;  Location: [facility];  Service: Urology;  Laterality: Right;  . MOUTH SURGERY    . ROBOTIC ASSISTED TOTAL HYSTERECTOMY WITH BILATERAL SALPINGO  OOPHERECTOMY Bilateral 04/17/2017   Procedure: XI ROBOTIC ASSISTED TOTAL HYSTERECTOMY WITH BILATERAL SALPINGO OOPHORECTOMY WITH SENTINAL LYMPH NODE BIOPSY AND POSSIBLE LYMPHADENECTOMY;  Surgeon: [name], MD;  Location: [facility];  Service: Gynecology;  Laterality: Bilateral;   Social History   Socioeconomic History  . Marital status: Married    Spouse name: Not on file  . Number of children: Not on file  . Years of education: Not on file  . Highest education level: Not on file  Occupational History  . Not on file  Tobacco Use  . Smoking status: Never Smoker  . Smokeless tobacco: Never Used  Substance and Sexual Activity  . Alcohol use: Yes    Comment: occassional  . Drug use: No  . Sexual activity: Yes  Other Topics Concern  . Not on file  Social History Narrative  . Not on file   Social Determinants of Health   Financial Resource Strain:   . Difficulty of Paying Living Expenses: Not on file  Food Insecurity:   . Worried About Charity fundraiser in the Last Year: Not on file  . Ran Out of Food in the Last Year: Not on file  Transportation Needs:   . Lack of Transportation (Medical): Not on file  . Lack of Transportation (Non-Medical): Not on file  Physical Activity:   . Days of Exercise per Week: Not on file  . Minutes of Exercise per Session: Not on file  Stress:   . Feeling of Stress : Not on file  Social Connections:   . Frequency  of Communication with Friends and Family: Not on file  . Frequency of Social Gatherings with Friends and Family: Not on file  . Attends Religious Services: Not on file  . Active Member of Clubs or Organizations: Not on file  . Attends Archivist Meetings: Not on file  . Marital Status: Not on file   Family History  Problem Relation Age of Onset  . Stroke Father   . Alzheimer's disease Father   . Hyperlipidemia Father   . Deep vein thrombosis Sister   . Breast cancer Paternal Grandmother        In her 79s, but lived to  be very elderly  . Arthritis Mother   . Lung cancer Maternal Grandmother   . Diabetes Paternal Grandfather   . Pancreatic cancer Paternal Grandfather    Allergies  Allergen Reactions  . Bee Venom Anaphylaxis  . Adhesive [Tape] Other (See Comments)    Irritation and red   Prior to Admission medications   Medication Sig Start Date End Date Taking? Authorizing Provider  ARMOUR THYROID 60 MG tablet TAKE 1 TABLET BY MOUTH EVERY DAY BEFORE BREAKFAST. TAKE 1 DAILY 5 DAYS PER WEEK 11/05/18  Yes Burchette, Alinda Sierras, MD  ARMOUR THYROID 90 MG tablet TAKE 1 TABLET BY MOUTH EVERY DAY TWO DAYS PER WEEK 05/05/19  Yes Burchette, Alinda Sierras, MD  rivaroxaban (XARELTO) 20 MG TABS tablet Take 1 tablet (20 mg total) by mouth daily with supper. 06/10/19  Yes Burchette, Alinda Sierras, MD  Rivaroxaban 15 & 20 MG TBPK Follow package directions: Take one 15mg  tablet by mouth twice a day. On day 22, switch to one 20mg  tablet once a day. Take with food. 06/03/19  Yes Burchette, Alinda Sierras, MD     Positive ROS: Otherwise negative  All other systems have been reviewed and were otherwise negative with the exception of those mentioned in the HPI and as above.  Physical Exam: Constitutional: Alert, well-appearing, no acute distress Ears: External ears without lesions or tenderness. Ear canals are clear bilaterally with intact, clear TMs.  Nasal: External nose without lesions. Septum midline. Clear nasal passages Oral: Lips and gums without lesions. Tongue and palate mucosa without lesions. Posterior oropharynx clear. Neck: Again noted is a small pea size approximately 8 mm to 9 mm lymph node just posterior to the lower left sternocleidomastoid muscle.  Slightly firm and mobile.  This has not changed in size since previous visit.  I cannot appreciate any other significant supraclavicular lymphadenopathy on the left side or right side.  No appreciable lymphadenopathy higher in the neck either. Respiratory: Breathing comfortably   Skin: No facial/neck lesions or rash noted.  Procedures  Assessment: Clinically this is more consistent with a reactive lymphadenopathy and does not appear to be a neoplastic on clinical evaluation.  Plan: Recommended follow-up in 4 months for recheck She will follow-up earlier if she notices any enlargement of lymph nodes. She is also scheduled to follow-up with Dr. Marin Olp concerning treatment of her DVTs.   Radene Journey, MD

## 2019-07-15 ENCOUNTER — Encounter: Payer: Self-pay | Admitting: Family Medicine

## 2019-07-21 ENCOUNTER — Encounter: Payer: Self-pay | Admitting: Family Medicine

## 2019-07-21 DIAGNOSIS — Z86718 Personal history of other venous thrombosis and embolism: Secondary | ICD-10-CM

## 2019-07-21 NOTE — Telephone Encounter (Signed)
Placed order for future D-dimer that she can come and get either tomorrow or Wednesday at her convenience after scheduling

## 2019-07-22 ENCOUNTER — Telehealth: Payer: Self-pay | Admitting: Family Medicine

## 2019-07-22 NOTE — Telephone Encounter (Signed)
Pt would like a call back in regards to the blood thinner that she is on. It is causing her to have problems with her vision she could not see her hand or phone but it eventually came back into focus. She also see flutters as well.   Patient Phone: [phone]

## 2019-07-22 NOTE — Telephone Encounter (Signed)
Please see message. °

## 2019-07-22 NOTE — Telephone Encounter (Signed)
Spoke with Sherry Williams.  She had a couple episodes today where she had some transient peripheral blurred vision.  She has had some recent intermittent headaches which are atypical for her.  She had one episode where she had difficulty focusing on her phone.  No history of known ocular migraines.  She was concerned this was related to her Xarelto.  She is not any focal weakness and is actually out walking now without difficulty.  We have recommended office follow-up to further evaluate as she has had some recent balance issues and other subtle neurologic symptoms.  Needs further evaluation

## 2019-07-23 ENCOUNTER — Other Ambulatory Visit: Payer: Self-pay

## 2019-07-23 ENCOUNTER — Other Ambulatory Visit: Payer: 59

## 2019-07-23 ENCOUNTER — Encounter: Payer: Self-pay | Admitting: Family Medicine

## 2019-07-23 ENCOUNTER — Ambulatory Visit (INDEPENDENT_AMBULATORY_CARE_PROVIDER_SITE_OTHER): Payer: 59 | Admitting: Family Medicine

## 2019-07-23 VITALS — BP 112/70 | HR 72 | Temp 97.9°F | Ht 66.0 in | Wt 146.6 lb

## 2019-07-23 DIAGNOSIS — R5383 Other fatigue: Secondary | ICD-10-CM | POA: Diagnosis not present

## 2019-07-23 DIAGNOSIS — E063 Autoimmune thyroiditis: Secondary | ICD-10-CM | POA: Diagnosis not present

## 2019-07-23 DIAGNOSIS — H539 Unspecified visual disturbance: Secondary | ICD-10-CM

## 2019-07-23 DIAGNOSIS — R0789 Other chest pain: Secondary | ICD-10-CM

## 2019-07-23 DIAGNOSIS — R29818 Other symptoms and signs involving the nervous system: Secondary | ICD-10-CM

## 2019-07-23 NOTE — Patient Instructions (Signed)
Visual Disturbances  A visual disturbance is any problem that interferes with your normal vision. This can affect one eye or both eyes. Some types of visual disturbances come and go without treatment and do not cause a permanent problem. Other visual disturbances may be a sign of a medical emergency. Visual disturbances include:  Blurred vision.  Being unable to see certain colors.  Being sensitive to light.  Double vision.  Partial vision loss (visual field deficit).  Being unaware of objects on one side of the body (visual spatial inattention).  Rhythmic eye movements that you cannot control (nystagmus).  Short-term or long-term blindness.  Seeing: ? Floating spots or lines (floaters). ? Flashing or shimmering lights. ? Zigzagging lines or stars. ? The floor as tilted (visual midline shift). ? Things that are not really there (hallucinations). Causes of visual disturbances include:  Eye infection.  The thin membrane at the back of the eye separating from the eyeball (retinal detachment).  High blood pressure.  Migraine.  Glaucoma.  Ischemic stroke.  Cerebral aneurysm. It is important to get your eyes checked by a health care provider or eye specialist (ophthalmologist or optometrist) as soon as possible to determine the cause of your visual disturbance. Follow these instructions at home:  Take over-the-counter and prescription medicines only as told by your health care provider.  Do not use any products that contain nicotine or tobacco, such as cigarettes and e-cigarettes. If you need help quitting, ask your health care provider.  To lower your risk of the problems that can lead to visual disturbances: ? Eat a balanced diet that includes fruits and vegetables, whole grains, lean meat, and low-fat dairy. ? Maintain a healthy weight. Work with your health care provider to lose weight if you need to. ? Exercise regularly. Ask your health care provider what  activities are safe for you.  Do not drive if you have trouble seeing. Ask your health care provider for guidance about when it is and is not safe for you to drive.  Keep all follow-up visits as told by your health care provider. This is important. Contact a health care provider if:  Your visual disturbance changes or becomes worse. Get help right away if you:   Have new visual disturbances.  Suddenly see flashing lights or floaters.  Suddenly have a dark area in your field of vision, especially in the lower part. This can lead to a loss of central vision.  Lose vision in one or both eyes.  Have any symptoms of a stroke. "BE FAST" is an easy way to remember the main warning signs of a stroke: ? B - Balance. Signs are dizziness, sudden trouble walking, or loss of balance. ? E - Eyes. Signs are trouble seeing or a sudden change in vision. ? F - Face. Signs are sudden weakness or numbness of the face, or the face or eyelid drooping on one side. ? A - Arms. Signs are weakness or numbness in an arm. This happens suddenly and usually on one side of the body. ? S - Speech. Signs are sudden trouble speaking, slurred speech, or trouble understanding what people say. ? T - Time. Time to call emergency services. Write down what time symptoms started.  Have other signs of a stroke, such as: ? A sudden, severe headache with no known cause. ? Nausea or vomiting. ? Seizure. These symptoms may represent a serious problem that is an emergency. Do not wait to see if the symptoms will go  away. Get medical help right away. Call your local emergency services (911 in the [place] Do not drive yourself to the hospital. Summary  A visual disturbance is any problem that interferes with your normal vision.  Some visual disturbances may be a sign of a medical emergency.  It is important to get your eyes checked by a health care provider or eye specialist to determine what kind of visual disturbance you  have. This information is not intended to replace advice given to you by your health care provider. Make sure you discuss any questions you have with your health care provider. Document Revised: 09/17/2018 Document Reviewed: 06/19/2017 Elsevier Patient Education  2020 [employer]  

## 2019-07-23 NOTE — Progress Notes (Signed)
Subjective:     Patient ID: Sherry Williams, female   DOB: [birth year], 59 y.o.   MRN: [id number]  HPI   Sherry Williams has history of DVT, pulmonary emboli, hypothyroidism, endometrial cancer, history of kidney stones.  She had prior history of pulmonary emboli back in 2017 with minimally elevated protein S level but otherwise unremarkable blood work-up.  She presented back in December with some bilateral leg pain without significant swelling.  Venous Doppler showed bilateral lower extremity DVTs.  She has been back on Xarelto since that time.  She also had acute superficial vein thrombosis of right basilic vein back in September.    She has been consistently taking Xarelto.  She had episode early Saturday morning where she woke up with right-sided substernal chest tightness which lasted about 5 minutes.  No radiation into the arm.  Did not recall any dyspnea.  She has had no chest pain since then.  This was not quite typical of previous pulmonary emboli.  She had no pleuritic pain.  No cough.  No exertional symptoms.  She walks for exercise.  No left-sided chest pain.  No recent exertional chest pain.   Her main reason for prompting this visit was episode yesterday where she had some left peripheral visual disturbance which lasted about 15 minutes.  This occurred in the morning.  She described this as "blinking lights ".  No true loss of vision.  She then that afternoon had episode where she was reaching down to use her phone on a call and for estimated less than 1 minute had difficulty focusing on the phone.  This was very transient lasting less than a minute and then she was able to complete her call.  She has had somewhat more frequent headaches recently.  No history of migraine headaches.  No history of ocular migraines.  She states she had visual exam back in December but this was not a dilated eye exam.  No abnormalities were noted then.   She had episode several weeks ago of vertigo triggered by turning  to the left in bed.  No associated hearing loss or tinnitus.  She has not had any classic vertigo symptoms past few days but has had some nonspecific symptoms of lightheadedness and feeling somewhat off balance.  She has not had any focal weakness.  No slurred speech.  No swallowing difficulties.  Denies any numbness or weakness in either extremity.  She has hypothyroidism and is due for follow-up labs.  She has had some general fatigue issues recently.  She had increased stress from multiple things including work, assisting her parents who have had several health concerns, and worries about her own health.  Past Medical History:  Diagnosis Date  . Allergy   . DVT (deep venous thrombosis) ([place])    lower extremity   . Endometrial ca ([place]) 03/14/2017  . Family history of adverse reaction to anesthesia    children had N/V   . Hypothyroidism    Hashimoto  . Lichen sclerosus   . MRSA (methicillin resistant staph aureus) culture positive   . Pulmonary embolism ([place]) 01/13/2016   After car trip   Past Surgical History:  Procedure Laterality Date  . CYSTOSCOPY WITH RETROGRADE PYELOGRAM, URETEROSCOPY AND STENT PLACEMENT Left 05/18/2018   Procedure: CYSTOSCOPY WITH RETROGRADE PYELOGRAM, LEFT URETEROSCOPY AND LEFT URETERAL STENT PLACEMENT;  Surgeon: [name], MD;  Location: [facility];  Service: Urology;  Laterality: Left;  . CYSTOSCOPY WITH RETROGRADE PYELOGRAM, URETEROSCOPY AND STENT PLACEMENT Right  05/30/2018   Procedure: CYSTOSCOPY WITH RIGHT RETROGRADE PYELOGRAM, URETEROSCOPY LASER LITHOTRIPSY AND STENT PLACEMENT;  Surgeon: [name], MD;  Location: [facility];  Service: Urology;  Laterality: Right;  . HOLMIUM LASER APPLICATION Left [id number]   Procedure: HOLMIUM LASER APPLICATION;  Surgeon: [name], MD;  Location: [facility];  Service: Urology;  Laterality: Left;  . HOLMIUM LASER APPLICATION Right [id number]   Procedure: HOLMIUM LASER APPLICATION;   Surgeon: [name], MD;  Location: [facility];  Service: Urology;  Laterality: Right;  . MOUTH SURGERY    . ROBOTIC ASSISTED TOTAL HYSTERECTOMY WITH BILATERAL SALPINGO OOPHERECTOMY Bilateral 04/17/2017   Procedure: XI ROBOTIC ASSISTED TOTAL HYSTERECTOMY WITH BILATERAL SALPINGO OOPHORECTOMY WITH SENTINAL LYMPH NODE BIOPSY AND POSSIBLE LYMPHADENECTOMY;  Surgeon: [name], MD;  Location: [facility];  Service: Gynecology;  Laterality: Bilateral;    reports that she has never smoked. She has never used smokeless tobacco. She reports current alcohol use. She reports that she does not use drugs. family history includes Alzheimer's disease in her father; Arthritis in her mother; Breast cancer in her paternal grandmother; Deep vein thrombosis in her sister; Diabetes in her paternal grandfather; Hyperlipidemia in her father; Lung cancer in her maternal grandmother; Pancreatic cancer in her paternal grandfather; Stroke in her father. Allergies  Allergen Reactions  . Bee Venom Anaphylaxis  . Adhesive [Tape] Other (See Comments)    Irritation and red     Review of Systems  Constitutional: Negative for appetite change, chills, fever and unexpected weight change.  Eyes: Positive for visual disturbance.  Respiratory: Negative for cough and shortness of breath.   Cardiovascular: Positive for chest pain. Negative for palpitations and leg swelling.  Gastrointestinal: Negative for abdominal pain, nausea and vomiting.  Genitourinary: Negative for dysuria.  Skin: Negative for rash.  Neurological: Positive for dizziness, light-headedness and headaches. Negative for seizures, syncope, facial asymmetry, speech difficulty and weakness.  Hematological: Does not bruise/bleed easily.  Psychiatric/Behavioral: Negative for confusion.       Objective:   Physical Exam Vitals reviewed.  Constitutional:      Appearance: Normal appearance.  HENT:     Head: Normocephalic and atraumatic.   Eyes:     Extraocular Movements: Extraocular movements intact.     Pupils: Pupils are equal, round, and reactive to light.     Comments: Fundi benign  Neck:     Vascular: No carotid bruit.     Comments: He has a couple of very small less than 1/2 cm lymph nodes anterior neck which are mobile.  No supraclavicular nodes palpated Cardiovascular:     Rate and Rhythm: Normal rate and regular rhythm.     Heart sounds: Normal heart sounds. No murmur. No gallop.   Pulmonary:     Effort: Pulmonary effort is normal.     Breath sounds: Normal breath sounds.  Musculoskeletal:     Cervical back: Neck supple. No rigidity.     Right lower leg: No edema.     Left lower leg: No edema.  Skin:    Findings: No rash.  Neurological:     General: No focal deficit present.     Mental Status: She is alert.     Cranial Nerves: No cranial nerve deficit.     Motor: No weakness.     Coordination: Coordination normal.     Gait: Gait normal.     Comments: Attempted Babinski's but she has increased with withdrawl response  Psychiatric:  Mood and Affect: Mood normal.        Thought Content: Thought content normal.        Assessment:     #1 recent episode of atypical right-sided chest pain which was very transient only lasting about 5 minutes and waking from sleep.  We do not suspect this was cardiac in any way.  Doubt related to recurrent pulmonary emboli as patient is on regular Xarelto.  No active reflux symptoms.  No recurrence since initial episode  #2 recent transient visual disturbance as above with 2 separate episodes yesterday.  We considered possibility of ocular migraine as first episode lasted about 15 or 20 minutes but she did not really have any blurred vision and second episode was very transient lasting only a minute.  She did not have any other focal neurologic symptoms at that time  #3 recent vertigo episode.  This sounded like benign peripheral positional vertigo as her symptoms were  triggered with movement to the left.  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  #6 hypothyroidism-on replacement and due for follow-up labs    Plan:     -Check further labs with TSH, free T4, sedimentation rate, D-dimer, CMP, CBC  -Consider MRI brain to further investigate her recent visual disturbance and intermittent headaches  -Follow-up immediately for any recurrent chest pain, visual disturbance, or any focal neurologic symptoms.  - have recommend follow up with ophthalmologist for any recurrent visual disturbance for dilated eye exam   Eulas Post MD [facility]

## 2019-07-24 ENCOUNTER — Encounter: Payer: Self-pay | Admitting: Family Medicine

## 2019-07-24 LAB — COMPREHENSIVE METABOLIC PANEL
ALT: 17 U/L (ref 0–35)
AST: 21 U/L (ref 0–37)
Albumin: 4.4 g/dL (ref 3.5–5.2)
Alkaline Phosphatase: 68 U/L (ref 39–117)
BUN: 19 mg/dL (ref 6–23)
CO2: 31 mEq/L (ref 19–32)
Calcium: 9.7 mg/dL (ref 8.4–10.5)
Chloride: 103 mEq/L (ref 96–112)
Creatinine, Ser: 1.03 mg/dL (ref 0.40–1.20)
GFR: 54.66 mL/min — ABNORMAL LOW (ref >60.00)
Glucose, Bld: 104 mg/dL — ABNORMAL HIGH (ref 70–99)
Potassium: 3.9 mEq/L (ref 3.5–5.1)
Sodium: 141 mEq/L (ref 135–145)
Total Bilirubin: 0.4 mg/dL (ref 0.2–1.2)
Total Protein: 6.9 g/dL (ref 6.0–8.3)

## 2019-07-24 LAB — TSH: TSH: 1.27 u[IU]/mL (ref 0.35–4.50)

## 2019-07-24 LAB — CBC WITH DIFFERENTIAL/PLATELET
Basophils Absolute: 0 10*3/uL (ref 0.0–0.1)
Basophils Relative: 0.9 % (ref 0.0–3.0)
Eosinophils Absolute: 0 10*3/uL (ref 0.0–0.7)
Eosinophils Relative: 1.1 % (ref 0.0–5.0)
HCT: 37.7 % (ref 36.0–46.0)
Hemoglobin: 12.6 g/dL (ref 12.0–15.0)
Lymphocytes Relative: 27.9 % (ref 12.0–46.0)
Lymphs Abs: 1.3 10*3/uL (ref 0.7–4.0)
MCHC: 33.3 g/dL (ref 30.0–36.0)
MCV: 87.5 fl (ref 78.0–100.0)
Monocytes Absolute: 0.4 10*3/uL (ref 0.1–1.0)
Monocytes Relative: 9.4 % (ref 3.0–12.0)
Neutro Abs: 2.9 10*3/uL (ref 1.4–7.7)
Neutrophils Relative %: 60.7 % (ref 43.0–77.0)
Platelets: 178 10*3/uL (ref 150.0–400.0)
RBC: 4.31 Mil/uL (ref 3.87–5.11)
RDW: 13.5 % (ref 11.5–15.5)
WBC: 4.7 10*3/uL (ref 4.0–10.5)

## 2019-07-24 LAB — SEDIMENTATION RATE: Sed Rate: 5 mm/hr (ref 0–30)

## 2019-07-24 LAB — T4, FREE: Free T4: 0.54 ng/dL — ABNORMAL LOW (ref 0.60–1.60)

## 2019-07-24 LAB — D-DIMER, QUANTITATIVE (NOT AT ARMC): D-Dimer, Quant: 2.47 mcg/mL FEU — ABNORMAL HIGH (ref <0.50)

## 2019-07-29 ENCOUNTER — Inpatient Hospital Stay (HOSPITAL_COMMUNITY): Payer: 59

## 2019-07-29 ENCOUNTER — Telehealth: Payer: Self-pay | Admitting: Family Medicine

## 2019-07-29 ENCOUNTER — Emergency Department (HOSPITAL_COMMUNITY): Payer: 59

## 2019-07-29 ENCOUNTER — Encounter (HOSPITAL_COMMUNITY): Payer: Self-pay

## 2019-07-29 ENCOUNTER — Inpatient Hospital Stay (HOSPITAL_COMMUNITY)
Admission: EM | Admit: 2019-07-29 | Discharge: 2019-08-01 | DRG: 065 | Disposition: A | Discharge status: Home / Self Care | Payer: 59 | Source: Ambulatory Visit | Attending: Internal Medicine | Admitting: Internal Medicine

## 2019-07-29 ENCOUNTER — Ambulatory Visit
Admission: RE | Admit: 2019-07-29 | Discharge: 2019-07-29 | Disposition: A | Discharge status: Home / Self Care | Payer: 59 | Source: Ambulatory Visit | Attending: Family Medicine | Admitting: Family Medicine

## 2019-07-29 ENCOUNTER — Other Ambulatory Visit: Payer: Self-pay

## 2019-07-29 DIAGNOSIS — I639 Cerebral infarction, unspecified: Secondary | ICD-10-CM | POA: Diagnosis present

## 2019-07-29 DIAGNOSIS — I82409 Acute embolism and thrombosis of unspecified deep veins of unspecified lower extremity: Secondary | ICD-10-CM | POA: Diagnosis present

## 2019-07-29 DIAGNOSIS — Z91048 Other nonmedicinal substance allergy status: Secondary | ICD-10-CM | POA: Diagnosis not present

## 2019-07-29 DIAGNOSIS — I2699 Other pulmonary embolism without acute cor pulmonale: Secondary | ICD-10-CM | POA: Diagnosis not present

## 2019-07-29 DIAGNOSIS — Z86711 Personal history of pulmonary embolism: Secondary | ICD-10-CM | POA: Diagnosis not present

## 2019-07-29 DIAGNOSIS — Z86718 Personal history of other venous thrombosis and embolism: Secondary | ICD-10-CM

## 2019-07-29 DIAGNOSIS — Z79890 Hormone replacement therapy (postmenopausal): Secondary | ICD-10-CM

## 2019-07-29 DIAGNOSIS — I6300 Cerebral infarction due to thrombosis of unspecified precerebral artery: Secondary | ICD-10-CM

## 2019-07-29 DIAGNOSIS — E063 Autoimmune thyroiditis: Secondary | ICD-10-CM | POA: Diagnosis present

## 2019-07-29 DIAGNOSIS — I6340 Cerebral infarction due to embolism of unspecified cerebral artery: Principal | ICD-10-CM | POA: Diagnosis present

## 2019-07-29 DIAGNOSIS — D6859 Other primary thrombophilia: Secondary | ICD-10-CM | POA: Diagnosis present

## 2019-07-29 DIAGNOSIS — I82501 Chronic embolism and thrombosis of unspecified deep veins of right lower extremity: Secondary | ICD-10-CM | POA: Diagnosis not present

## 2019-07-29 DIAGNOSIS — Z7901 Long term (current) use of anticoagulants: Secondary | ICD-10-CM | POA: Diagnosis not present

## 2019-07-29 DIAGNOSIS — Z87892 Personal history of anaphylaxis: Secondary | ICD-10-CM

## 2019-07-29 DIAGNOSIS — Z90722 Acquired absence of ovaries, bilateral: Secondary | ICD-10-CM | POA: Diagnosis not present

## 2019-07-29 DIAGNOSIS — I6389 Other cerebral infarction: Secondary | ICD-10-CM | POA: Diagnosis not present

## 2019-07-29 DIAGNOSIS — Z90710 Acquired absence of both cervix and uterus: Secondary | ICD-10-CM | POA: Diagnosis not present

## 2019-07-29 DIAGNOSIS — Z9079 Acquired absence of other genital organ(s): Secondary | ICD-10-CM | POA: Diagnosis not present

## 2019-07-29 DIAGNOSIS — Z8542 Personal history of malignant neoplasm of other parts of uterus: Secondary | ICD-10-CM

## 2019-07-29 DIAGNOSIS — E039 Hypothyroidism, unspecified: Secondary | ICD-10-CM | POA: Diagnosis not present

## 2019-07-29 DIAGNOSIS — Z20822 Contact with and (suspected) exposure to covid-19: Secondary | ICD-10-CM | POA: Diagnosis present

## 2019-07-29 DIAGNOSIS — Z91030 Bee allergy status: Secondary | ICD-10-CM | POA: Diagnosis not present

## 2019-07-29 DIAGNOSIS — R29700 NIHSS score 0: Secondary | ICD-10-CM | POA: Diagnosis present

## 2019-07-29 DIAGNOSIS — R29818 Other symptoms and signs involving the nervous system: Secondary | ICD-10-CM

## 2019-07-29 DIAGNOSIS — E785 Hyperlipidemia, unspecified: Secondary | ICD-10-CM | POA: Diagnosis present

## 2019-07-29 DIAGNOSIS — Z832 Family history of diseases of the blood and blood-forming organs and certain disorders involving the immune mechanism: Secondary | ICD-10-CM | POA: Diagnosis not present

## 2019-07-29 DIAGNOSIS — Z823 Family history of stroke: Secondary | ICD-10-CM | POA: Diagnosis not present

## 2019-07-29 DIAGNOSIS — M79661 Pain in right lower leg: Secondary | ICD-10-CM | POA: Diagnosis not present

## 2019-07-29 DIAGNOSIS — I82403 Acute embolism and thrombosis of unspecified deep veins of lower extremity, bilateral: Secondary | ICD-10-CM | POA: Diagnosis not present

## 2019-07-29 DIAGNOSIS — H538 Other visual disturbances: Secondary | ICD-10-CM | POA: Diagnosis present

## 2019-07-29 DIAGNOSIS — M79662 Pain in left lower leg: Secondary | ICD-10-CM | POA: Diagnosis not present

## 2019-07-29 LAB — COMPREHENSIVE METABOLIC PANEL
ALT: 20 U/L (ref 0–44)
AST: 26 U/L (ref 15–41)
Albumin: 4.1 g/dL (ref 3.5–5.0)
Alkaline Phosphatase: 64 U/L (ref 38–126)
Anion gap: 10 (ref 5–15)
BUN: 14 mg/dL (ref 6–20)
CO2: 27 mmol/L (ref 22–32)
Calcium: 9.6 mg/dL (ref 8.9–10.3)
Chloride: 105 mmol/L (ref 98–111)
Creatinine, Ser: 0.91 mg/dL (ref 0.44–1.00)
GFR calc Af Amer: >60 mL/min (ref >60)
GFR calc non Af Amer: >60 mL/min (ref >60)
Glucose, Bld: 96 mg/dL (ref 70–99)
Potassium: 3.8 mmol/L (ref 3.5–5.1)
Sodium: 142 mmol/L (ref 135–145)
Total Bilirubin: 0.5 mg/dL (ref 0.3–1.2)
Total Protein: 6.7 g/dL (ref 6.5–8.1)

## 2019-07-29 LAB — CBC
HCT: 39.5 % (ref 36.0–46.0)
Hemoglobin: 12.8 g/dL (ref 12.0–15.0)
MCH: 29 pg (ref 26.0–34.0)
MCHC: 32.4 g/dL (ref 30.0–36.0)
MCV: 89.6 fL (ref 80.0–100.0)
Platelets: 179 10*3/uL (ref 150–400)
RBC: 4.41 MIL/uL (ref 3.87–5.11)
RDW: 12.6 % (ref 11.5–15.5)
WBC: 5.5 10*3/uL (ref 4.0–10.5)
nRBC: 0 % (ref 0.0–0.2)

## 2019-07-29 LAB — DIFFERENTIAL
Abs Immature Granulocytes: 0.01 10*3/uL (ref 0.00–0.07)
Basophils Absolute: 0 10*3/uL (ref 0.0–0.1)
Basophils Relative: 0 %
Eosinophils Absolute: 0.1 10*3/uL (ref 0.0–0.5)
Eosinophils Relative: 1 %
Immature Granulocytes: 0 %
Lymphocytes Relative: 25 %
Lymphs Abs: 1.4 10*3/uL (ref 0.7–4.0)
Monocytes Absolute: 0.5 10*3/uL (ref 0.1–1.0)
Monocytes Relative: 9 %
Neutro Abs: 3.6 10*3/uL (ref 1.7–7.7)
Neutrophils Relative %: 65 %

## 2019-07-29 LAB — PROTIME-INR
INR: 1.2 (ref 0.8–1.2)
Prothrombin Time: 14.9 seconds (ref 11.4–15.2)

## 2019-07-29 LAB — HEPARIN LEVEL (UNFRACTIONATED): Heparin Unfractionated: 0.21 IU/mL — ABNORMAL LOW (ref 0.30–0.70)

## 2019-07-29 LAB — TSH: TSH: 2.56 u[IU]/mL (ref 0.350–4.500)

## 2019-07-29 LAB — SARS CORONAVIRUS 2 (TAT 6-24 HRS): SARS Coronavirus 2: NEGATIVE

## 2019-07-29 LAB — HIV ANTIBODY (ROUTINE TESTING W REFLEX): HIV Screen 4th Generation wRfx: NONREACTIVE

## 2019-07-29 LAB — APTT: aPTT: 28 seconds (ref 24–36)

## 2019-07-29 MED ORDER — ACETAMINOPHEN 325 MG PO TABS
650.0000 mg | ORAL_TABLET | ORAL | Status: DC | PRN
  Administered 2019-07-30 (×2): 650 mg via ORAL
  Filled 2019-07-29 (×2): qty 2

## 2019-07-29 MED ORDER — SENNOSIDES-DOCUSATE SODIUM 8.6-50 MG PO TABS: 1.0000 | ORAL_TABLET | Freq: Every evening | ORAL | Status: DC | PRN

## 2019-07-29 MED ORDER — THYROID 60 MG PO TABS: 90.0000 mg | ORAL_TABLET | ORAL | Status: DC

## 2019-07-29 MED ORDER — IOHEXOL 350 MG/ML SOLN
100.0000 mL | Freq: Once | INTRAVENOUS | Status: AC | PRN
  Administered 2019-07-29: 80 mL via INTRAVENOUS

## 2019-07-29 MED ORDER — STROKE: EARLY STAGES OF RECOVERY BOOK: Freq: Once | Status: DC

## 2019-07-29 MED ORDER — SODIUM CHLORIDE 0.9 % IV SOLN: INTRAVENOUS | Status: DC

## 2019-07-29 MED ORDER — THYROID 60 MG PO TABS
60.0000 mg | ORAL_TABLET | ORAL | Status: DC
  Administered 2019-07-30 – 2019-08-01 (×3): 60 mg via ORAL
  Filled 2019-07-29 (×3): qty 1

## 2019-07-29 MED ORDER — HEPARIN (PORCINE) 25000 UT/250ML-% IV SOLN
650.0000 [IU]/h | INTRAVENOUS | Status: DC
  Administered 2019-07-29 – 2019-07-31 (×2): 800 [IU]/h via INTRAVENOUS
  Filled 2019-07-29 (×2): qty 250

## 2019-07-29 MED ORDER — RIVAROXABAN 20 MG PO TABS
20.0000 mg | ORAL_TABLET | Freq: Every day | ORAL | Status: DC
  Filled 2019-07-29: qty 1

## 2019-07-29 MED ORDER — ACETAMINOPHEN 650 MG RE SUPP: 650.0000 mg | RECTAL | Status: DC | PRN

## 2019-07-29 MED ORDER — GADOBUTROL 1 MMOL/ML IV SOLN
6.0000 mL | Freq: Once | INTRAVENOUS | Status: AC | PRN
  Administered 2019-07-29: 6 mL via INTRAVENOUS

## 2019-07-29 MED ORDER — THYROID 60 MG PO TABS: 60.0000 mg | ORAL_TABLET | ORAL | Status: DC

## 2019-07-29 MED ORDER — ACETAMINOPHEN 160 MG/5ML PO SOLN: 650.0000 mg | ORAL | Status: DC | PRN

## 2019-07-29 NOTE — ED Provider Notes (Signed)
[facility] EMERGENCY DEPARTMENT Provider Note   CSN: [id number] Arrival date & time: 07/29/19  1039     History Chief Complaint  Patient presents with  . Stroke from [place] is a 59 y.o. female.  Patient is a 59 year old female patient who presents to the ED after an abnormal MRI finding.  She states she has had some unusual symptoms over the last week.  She has had 2 episode of visual issues.  1 episode she had a black and white patch in her left eye.  This resolved on its own.  Another day during this week she had a phone call, and when she was looking down at her phone she could not tell the difference between her phone in her hand.  She was not able to answer the phone.  She felt a little confused.  She lie down on the floor and the symptoms seem to have resolved.  Yesterday she had 2 episodes where her right arm felt heavy.  It did not feel numb.  She was able to move it although it did felt heavier than normal.  She has had feeling of an abnormal headache all week.  She has had a couple episodes of dizziness which is worse if she turns her head to the left.  No persistent vertigo type symptoms.  She was diagnosed with bilateral DVTs in December.  She is currently on Xarelto.  She has had over the past couple of weeks she has had some episodes of chest tightness.  Sometimes it is associated with shortness of breath.  She also feels more short of breath at times when she is exercising.  She has had a couple of episodes where she felt like she was having panic attacks and she is had to lie on the floor and let them resolve.  She says she is not normally an anxious person.  She had an MRI done that was ordered by her PCP this morning which showed concerning findings with acute/subacute multiple possibly embolic type infarcts.        Past Medical History:  Diagnosis Date  . Allergy   . DVT (deep venous thrombosis) ([place])    lower extremity   .  Endometrial ca ([place]) 03/14/2017  . Family history of adverse reaction to anesthesia    children had N/V   . Hypothyroidism    Hashimoto  . Lichen sclerosus   . MRSA (methicillin resistant staph aureus) culture positive   . Pulmonary embolism ([place]) 01/13/2016   After car trip    Patient Active Problem List   Diagnosis Date Noted  . DVT (deep venous thrombosis) ([place])   . Nephrolithiasis 05/17/2018  . Endometrial cancer ([place]) 04/17/2017  . MRSA (methicillin resistant staph aureus) culture positive 03/14/2017  . Lichen sclerosus et atrophicus 03/14/2017  . Pulmonary embolus ([place]) 08/14/2016  . 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 CYSTOSCOPY WITH RETROGRADE PYELOGRAM, URETEROSCOPY AND STENT PLACEMENT Left 05/18/2018   Procedure: CYSTOSCOPY WITH RETROGRADE PYELOGRAM, LEFT URETEROSCOPY AND LEFT URETERAL STENT PLACEMENT;  Surgeon: [name], MD;  [facility];  Service: Urology;  Laterality: Left;  . CYSTOSCOPY WITH RETROGRADE PYELOGRAM, URETEROSCOPY AND STENT PLACEMENT Right 05/30/2018   Procedure: CYSTOSCOPY WITH RIGHT RETROGRADE PYELOGRAM, URETEROSCOPY LASER LITHOTRIPSY AND STENT PLACEMENT;  Surgeon: [name], MD;  Location: [facility];  Service: Urology;  Laterality: Right;  . HOLMIUM  LASER APPLICATION Left [id number]   Procedure: HOLMIUM LASER APPLICATION;  Surgeon: [name], MD;  Location: [facility];  Service: Urology;  Laterality: Left;  . HOLMIUM LASER APPLICATION Right [id number]   Procedure: HOLMIUM LASER APPLICATION;  Surgeon: [name], MD;  Location: [facility];  Service: Urology;  Laterality: Right;  . MOUTH SURGERY    . ROBOTIC ASSISTED TOTAL HYSTERECTOMY WITH BILATERAL SALPINGO OOPHERECTOMY Bilateral 04/17/2017   Procedure: XI ROBOTIC ASSISTED TOTAL HYSTERECTOMY WITH BILATERAL SALPINGO OOPHORECTOMY WITH SENTINAL LYMPH NODE BIOPSY AND  POSSIBLE LYMPHADENECTOMY;  Surgeon: [name], MD;  Location: [facility];  Service: Gynecology;  Laterality: Bilateral;     OB History   No obstetric history on file.     Family History  Problem Relation Age of Onset  . Stroke Father   . Alzheimer's disease Father   . Hyperlipidemia Father   . Deep vein thrombosis Sister   . Breast cancer Paternal Grandmother        In her 75s, but lived to be very elderly  . Arthritis Mother   . Lung cancer Maternal Grandmother   . Diabetes Paternal Grandfather   . Pancreatic cancer Paternal Grandfather     Social History   Tobacco Use  . Smoking status: Never Smoker  . Smokeless tobacco: Never Used  Substance Use Topics  . Alcohol use: Yes    Comment: occassional  . Drug use: No    Home Medications Prior to Admission medications   Medication Sig Start Date End Date Taking? Authorizing Provider  ARMOUR THYROID 60 MG tablet TAKE 1 TABLET BY MOUTH EVERY DAY BEFORE BREAKFAST. TAKE 1 DAILY 5 DAYS PER WEEK Patient taking differently: Take 60 mg by mouth See admin instructions. Taking 60mg  5 days a week 11/05/18  Yes Burchette, Alinda Sierras, MD  ARMOUR THYROID 90 MG tablet TAKE 1 TABLET BY MOUTH EVERY DAY TWO DAYS PER WEEK Patient taking differently: Take 90 mg by mouth See admin instructions. Taking 90mg  2 days a week  ( could not provide schedule) 05/05/19  Yes Burchette, Alinda Sierras, MD  rivaroxaban (XARELTO) 20 MG TABS tablet Take 1 tablet (20 mg total) by mouth daily with supper. 06/10/19  Yes Burchette, Alinda Sierras, MD    Allergies    Bee venom and Adhesive [tape]  Review of Systems   Review of Systems  Constitutional: Positive for fatigue. Negative for chills, diaphoresis and fever.  HENT: Negative for congestion, rhinorrhea and sneezing.   Eyes: Positive for visual disturbance.  Respiratory: Positive for chest tightness and shortness of breath. Negative for cough.   Cardiovascular: Negative for chest pain and leg swelling.  Gastrointestinal:  Negative for abdominal pain, blood in stool, diarrhea, nausea and vomiting.  Genitourinary: Negative for difficulty urinating, flank pain, frequency and hematuria.  Musculoskeletal: Negative for arthralgias and back pain.  Skin: Negative for rash.  Neurological: Positive for dizziness, weakness and headaches. Negative for speech difficulty and numbness.    Physical Exam Updated Vital Signs BP 132/63   Pulse 71   Temp 98.1 F (36.7 C) (Oral)   Resp 16   LMP 09/04/2012   SpO2 100%   Physical Exam Constitutional:      Appearance: She is well-developed.  HENT:     Head: Normocephalic and atraumatic.  Eyes:     Pupils: Pupils are equal, round, and reactive to light.  Cardiovascular:     Rate and Rhythm: Normal rate and regular rhythm.     Heart sounds: Normal  heart sounds.  Pulmonary:     Effort: Pulmonary effort is normal. No respiratory distress.     Breath sounds: Normal breath sounds. No wheezing or rales.  Chest:     Chest wall: No tenderness.  Abdominal:     General: Bowel sounds are normal.     Palpations: Abdomen is soft.     Tenderness: There is no abdominal tenderness. There is no guarding or rebound.  Musculoskeletal:        General: Normal range of motion.     Cervical back: Normal range of motion and neck supple.  Lymphadenopathy:     Cervical: No cervical adenopathy.  Skin:    General: Skin is warm and dry.     Findings: No rash.  Neurological:     Mental Status: She is alert and oriented to person, place, and time.     Comments: Motor 5/5 all extremities Sensation grossly intact to LT all extremities Finger to Nose intact, no pronator drift CN II-XII grossly intact       ED Results / Procedures / Treatments   Labs (all labs ordered are listed, but only abnormal results are displayed) Labs Reviewed  SARS CORONAVIRUS 2 (TAT 6-24 HRS)  PROTIME-INR  APTT  CBC  DIFFERENTIAL  COMPREHENSIVE METABOLIC PANEL    EKG EKG  Interpretation  Date/Time:  Tuesday July 29 2019 11:23:30 EST Ventricular Rate:  71 PR Interval:    QRS Duration: 98 QT Interval:  393 QTC Calculation: 428 R Axis:   85 Text Interpretation: Sinus rhythm Short PR interval Borderline right axis deviation since last tracing no significant change Confirmed by Malvin Johns [phone]) on 07/29/2019 12:25:14 PM   Radiology CT Angio Chest PE W/Cm &/Or Wo Cm  Result Date: 07/29/2019 CLINICAL DATA:  Shortness of breath EXAM: CT ANGIOGRAPHY CHEST WITH CONTRAST TECHNIQUE: Multidetector CT imaging of the chest was performed using the standard protocol during bolus administration of intravenous contrast. Multiplanar CT image reconstructions and MIPs were obtained to evaluate the vascular anatomy. CONTRAST:  14mL OMNIPAQUE IOHEXOL 350 MG/ML SOLN COMPARISON:  CT 11/14/2017 FINDINGS: Cardiovascular: Satisfactory opacification of the pulmonary arteries to the segmental level. No evidence of pulmonary embolism. Normal heart size. No pericardial effusion. Thoracic aorta is nonaneurysmal. Mediastinum/Nodes: No enlarged mediastinal, hilar, or axillary lymph nodes. Thyroid gland, trachea, and esophagus demonstrate no significant findings. Lungs/Pleura: The lungs are clear without focal airspace consolidation, pleural effusion, or pneumothorax. Minimal linear scarring in the anterior aspect of the right middle lobe, unchanged. Upper Abdomen: No acute abnormality. Musculoskeletal: No chest wall abnormality. No acute or significant osseous findings. Review of the MIP images confirms the above findings. IMPRESSION: 1. Negative for pulmonary embolism. 2. No active pulmonary disease. Electronically Signed   By: Davina Poke D.O.   On: 07/29/2019 15:22   MR Brain Wo Contrast  Addendum Date: 07/29/2019   ADDENDUM REPORT: 07/29/2019 09:51 ADDENDUM: These results were called by telephone at the time of interpretation on 07/29/2019 at 9:51 am to provider Carolann Littler , who  verbally acknowledged these results. Electronically Signed   By: Kellie Simmering DO   On: 07/29/2019 09:51   Result Date: 07/29/2019 CLINICAL DATA:  Other symptoms and signs involving the nervous system. Intermittent visual disturbance, recent headache; neuro deficit, subacute. Additional history provided by technologist: Intermittent visual changes, 2 episodes of right arm heaviness, dizziness and some bilateral hearing loss, history of uterine cancer 2018 EXAM: MRI HEAD WITHOUT CONTRAST TECHNIQUE: Multiplanar, multiecho pulse sequences of the brain and  surrounding structures were obtained without intravenous contrast. COMPARISON:  No pertinent prior studies available for comparison. FINDINGS: Brain: There are numerous small foci of restricted diffusion consistent with acute/early subacute infarct. These infarcts are predominantly cortically based within the bilateral parietal lobes. However, there are additional small acute/early subacute infarcts within the parietal white matter, cortical and subcortical posterolateral left frontal lobe, within the anterior right frontal lobe cortex, and within the bilateral occipital lobe cortex, as well as left caudate head. Corresponding T2/FLAIR hyperintensity at these sites. Additionally, there is a small amount of precontrast T1 hyperintensity within the right parietal cortex which may reflect cortical laminar necrosis. No chronic intracranial blood products. There is no significant mass effect. No effacement of the ventricular system or midline shift. No extra-axial fluid collection. No evidence of intracranial mass. Background mild scattered T2/FLAIR hyperintensity within the cerebral white matter is nonspecific, but consistent with chronic small vessel ischemic disease. Cerebral volume is normal for age. Vascular: Flow voids maintained within the proximal large arterial vessels. Skull and upper cervical spine: No focal marrow lesion. Sinuses/Orbits: Visualized orbits  demonstrate no acute abnormality. Minimal ethmoid and maxillary sinus mucosal thickening. No significant mastoid effusion. IMPRESSION: Numerous small acute/early subacute infarcts predominantly located within the bilateral parietal cortex. Additional small acute/early subacute infarcts within the parietal white matter, posterolateral left frontal lobe, anterior right frontal lobe and bilateral occipital lobes as well as left caudate nucleus. Given involvement of multiple vascular territories, correlate for an embolic process. No significant mass effect.  No evidence of hemorrhagic conversion. Background mild chronic small vessel ischemic disease. Electronically Signed: By: Kellie Simmering DO On: 07/29/2019 09:46    Procedures Procedures (including critical care time)  Medications Ordered in ED Medications  iohexol (OMNIPAQUE) 350 MG/ML injection 100 mL (80 mLs Intravenous Contrast Given 07/29/19 1509)    ED Course  I have reviewed the triage vital signs and the nursing notes.  Pertinent labs & imaging results that were available during my care of the patient were reviewed by me and considered in my medical decision making (see chart for details).    MDM Rules/Calculators/A&P                      Patient is a 59 year old female who presents after having an abnormal MRI that showed multiple acute/subacute strokes.  Dr. Leonel Ramsay has reviewed the images and seen the patient and feels that this may be more of a watershed distribution stroke in is concerned that she may have had a cardiac/hypotensive event leading to the brain infarcts.  Her blood pressures and vital signs are remained stable.  She is remained fairly asymptomatic in the ED.  She did have some episodes of shortness of breath and chest pain over the last couple of weeks so I did do a CT scan of her chest to rule out PE given her history of DVT and this was negative.  She has had no arrhythmias.  Her labs are nonconcerning.  I will consult  the hospitalist for admission and further evaluation. Final Clinical Impression(s) / ED Diagnoses Final diagnoses:  Cerebrovascular accident (CVA), unspecified mechanism [facility])    Rx / DC Orders ED Discharge Orders    None       Malvin Johns, MD 08/04/19 1012

## 2019-07-29 NOTE — Consult Note (Signed)
Neurology Consultation  Reason for Consult: Stroke Referring Physician: Dr. Tamera Punt  CC: Abnormal MRI  History is obtained from: Patient   HPI: Sherry Williams is a 59 y.o. female  with history of pulmonary embolism, hypothyroidism, endometrial cancer, DVTs.  Patient is on chronic Xarelto.  Due to multiple symptoms since last Wednesday, patient was sent to get a MRI of her brain which showed bilateral embolic infarcts.  Neurology was consulted to evaluate patient for this reason.  Patient states that on Wednesday she was talking to her sister (Who is her boss).  Apparently, her sister had said something to her that got her worked up to the point she felt as though she was having an anxiety attack.  She felt short of breath, laid down on the floor at which point she describes visual symptoms of "fan like a white and black image on the left aspect of her vision".  She states this lasted for a little while and then dissipated.  Later in the day, her phone was ringing, when she went to pick it up, with her left hand but it appeared as though it was melded into her hand like "putty", she states she did not have good enough dexterity to grab it with her other hand.  Again this eventually dissipated within a minute or two.  At that point she called her PCP who had made an appointment for patient to get MRI of her brain due to the multiple different neurological symptoms.  On Saturday morning of last week patient states that she woke up in the middle night and had severe chest discomfort on the right aspect of her chest.  She noted significant difficulty breathing.  Again she laid down on the floor and eventually this dissipated.  Today patient again had some numbness and tingling in her right arm that again was transient.  At this time she feels back to baseline.  Patient was told to go to the emergency department secondary to the abnormalities they noted on MRI.   ED course   MRI of brain-numerous  small acute/early subacute infarcts predominantly located within the bilateral parietal cortex.  Additional small acute/early subacute infarcts within the parietal white matter, posterior lateral left frontal lobe, anterior right frontal lobe and bilateral occipital lobes as well as left caudate nucleus.  Given involvement of multiple vascular territories, correlate for an embolic process.  Chart review  --6 days ago patient had a D-dimer of 2.47  LKW: 07/23/2019 with unknown time tpa given?: no, out of window Premorbid modified Rankin scale (mRS): 0 NIH stroke scale 0   Past Medical History:  Diagnosis Date  . Allergy   . DVT (deep venous thrombosis) ([place])    lower extremity   . Endometrial ca ([place]) 03/14/2017  . Family history of adverse reaction to anesthesia    children had N/V   . Hypothyroidism    Hashimoto  . Lichen sclerosus   . MRSA (methicillin resistant staph aureus) culture positive   . Pulmonary embolism ([place]) 01/13/2016   After car trip    Family History  Problem Relation Age of Onset  . Stroke Father   . Alzheimer's disease Father   . Hyperlipidemia Father   . Deep vein thrombosis Sister   . Breast cancer Paternal Grandmother        In her 67s, but lived to be very elderly  . Arthritis Mother   . Lung cancer Maternal Grandmother   . Diabetes Paternal Grandfather   .  Pancreatic cancer Paternal Grandfather    Social History:   reports that she has never smoked. She has never used smokeless tobacco. She reports current alcohol use. She reports that she does not use drugs.  Medications No current facility-administered medications for this encounter.  Current Outpatient Medications:  .  ARMOUR THYROID 60 MG tablet, TAKE 1 TABLET BY MOUTH EVERY DAY BEFORE BREAKFAST. TAKE 1 DAILY 5 DAYS PER WEEK (Patient taking differently: Take 60 mg by mouth See admin instructions. Taking 60mg  5 days a week), Disp: 90 tablet, Rfl: 2 .  ARMOUR THYROID 90 MG tablet, TAKE 1 TABLET  BY MOUTH EVERY DAY TWO DAYS PER WEEK (Patient taking differently: Take 90 mg by mouth See admin instructions. Taking 90mg  2 days a week  ( could not provide schedule)), Disp: 90 tablet, Rfl: 1 .  rivaroxaban (XARELTO) 20 MG TABS tablet, Take 1 tablet (20 mg total) by mouth daily with supper., Disp: 90 tablet, Rfl: 1  ROS:    General ROS: negative for - chills, fatigue, fever, night sweats, weight gain or weight loss Psychological ROS: negative for - behavioral disorder, hallucinations, memory difficulties, mood swings or suicidal ideation Ophthalmic ROS: Positive for - blurry vision,  ENT ROS: negative for - epistaxis, nasal discharge, oral lesions, sore throat, tinnitus or vertigo Allergy and Immunology ROS: negative for - hives or itchy/watery eyes Hematological and Lymphatic ROS: negative for - bleeding problems, bruising or swollen lymph nodes Endocrine ROS: negative for - galactorrhea, hair pattern changes, polydipsia/polyuria or temperature intolerance Respiratory ROS: negative for - cough, hemoptysis, shortness of breath or wheezing Cardiovascular ROS: Positive for - chest pain, Gastrointestinal ROS: negative for - abdominal pain, diarrhea, hematemesis, nausea/vomiting or stool incontinence Genito-Urinary ROS: negative for - dysuria, hematuria, incontinence or urinary frequency/urgency Musculoskeletal ROS: negative for - joint swelling or muscular weakness Neurological ROS: as noted in HPI Dermatological ROS: negative for rash and skin lesion changes  Exam: Current vital signs: BP (!) 130/91 (BP Location: Right Arm)   Pulse 61   Temp 98.1 F (36.7 C) (Oral)   Resp 16   LMP 09/04/2012   SpO2 100%  Vital signs in last 24 hours: Temp:  [98.1 F (36.7 C)] 98.1 F (36.7 C) (02/16 1050) Pulse Rate:  [61] 61 (02/16 1050) Resp:  [16] 16 (02/16 1050) BP: (130)/(91) 130/91 (02/16 1050) SpO2:  [100 %] 100 % (02/16 1050)   Constitutional: Appears well-developed and well-nourished.   Psych: Affect appropriate to situation Eyes: No scleral injection HENT: No OP obstrucion Head: Normocephalic.  Cardiovascular: Normal rate and regular rhythm.  Respiratory: Effort normal, non-labored breathing GI: Soft.  No distension. There is no tenderness.  Skin: WDI  Neuro: Mental Status: Patient is awake, alert, oriented to person, place, month, year, and situation. Speech-normal naming, repeating,comprehension Patient is able to give a clear and coherent history. Cranial Nerves: II: Visual Fields are full.  III,IV, VI: EOMI without ptosis or diploplia. Pupils equal, round and reactive to light V: Facial sensation is symmetric to temperature VII: Facial movement is symmetric.  VIII: hearing is intact to voice X: Palat elevates symmetrically XI: Shoulder shrug is symmetric. XII: tongue is midline without atrophy or fasciculations.  Motor: Tone is normal. Bulk is normal. 5/5 strength was present in all four extremities.  No drift Sensory: Sensation is symmetric to light touch and temperature in the arms and legs. Deep Tendon Reflexes: 2+ and symmetric in the biceps and patellae.  Plantars: Toes are downgoing bilaterally.  Cerebellar: FNF  and HKS are intact bilaterally  Labs I have reviewed labs in epic and the results pertinent to this consultation are:   CBC    Component Value Date/Time   WBC 5.5 07/29/2019 1102   RBC 4.41 07/29/2019 1102   HGB 12.8 07/29/2019 1102   HGB 12.0 02/15/2017 1518   HCT 39.5 07/29/2019 1102   HCT 35.5 02/15/2017 1518   PLT 179 07/29/2019 1102   PLT 234 02/15/2017 1518   MCV 89.6 07/29/2019 1102   MCV 91 02/15/2017 1518   MCH 29.0 07/29/2019 1102   MCHC 32.4 07/29/2019 1102   RDW 12.6 07/29/2019 1102   RDW 12.6 02/15/2017 1518   LYMPHSABS 1.4 07/29/2019 1102   LYMPHSABS 1.6 02/15/2017 1518   MONOABS 0.5 07/29/2019 1102   EOSABS 0.1 07/29/2019 1102   EOSABS 0.1 02/15/2017 1518   BASOSABS 0.0 07/29/2019 1102   BASOSABS 0.0  02/15/2017 1518    CMP     Component Value Date/Time   NA 142 07/29/2019 1102   NA 142 02/15/2017 1518   NA 140 04/13/2016 1055   K 3.8 07/29/2019 1102   K 4.1 02/15/2017 1518   K 4.1 04/13/2016 1055   CL 105 07/29/2019 1102   CL 107 02/15/2017 1518   CO2 27 07/29/2019 1102   CO2 29 02/15/2017 1518   CO2 27 04/13/2016 1055   GLUCOSE 96 07/29/2019 1102   GLUCOSE 98 02/15/2017 1518   BUN 14 07/29/2019 1102   BUN 17 02/15/2017 1518   BUN 22.1 04/13/2016 1055   CREATININE 0.91 07/29/2019 1102   CREATININE 1.0 02/15/2017 1518   CREATININE 0.8 04/13/2016 1055   CALCIUM 9.6 07/29/2019 1102   CALCIUM 9.3 02/15/2017 1518   CALCIUM 9.9 04/13/2016 1055   PROT 6.7 07/29/2019 1102   PROT 6.5 02/15/2017 1518   PROT 7.7 04/13/2016 1055   ALBUMIN 4.1 07/29/2019 1102   ALBUMIN 3.7 02/15/2017 1518   ALBUMIN 4.2 04/13/2016 1055   AST 26 07/29/2019 1102   AST 26 02/15/2017 1518   AST 20 04/13/2016 1055   ALT 20 07/29/2019 1102   ALT 24 02/15/2017 1518   ALT 22 04/13/2016 1055   ALKPHOS 64 07/29/2019 1102   ALKPHOS 79 02/15/2017 1518   ALKPHOS 81 04/13/2016 1055   BILITOT 0.5 07/29/2019 1102   BILITOT 0.60 02/15/2017 1518   BILITOT 0.44 04/13/2016 1055   GFRNONAA >60 07/29/2019 1102   GFRAA >60 07/29/2019 1102    Lipid Panel  No results found for: CHOL, TRIG, HDL, CHOLHDL, VLDL, LDLCALC, LDLDIRECT   Imaging I have reviewed the images obtained:  MRI examination of the brain- IMPRESSION: Numerous small acute/early subacute infarcts predominantly located within the bilateral parietal cortex. Additional small acute/early subacute infarcts within the parietal white matter, posterolateral left frontal lobe, anterior right frontal lobe and bilateral occipital lobes as well as left caudate nucleus. Given involvement of multiple vascular territories, correlate for an embolic process.  No significant mass effect.  No evidence of hemorrhagic conversion.  Etta Quill PA-C [facility] [phone]  M-F  (9:00 am- 5:00 PM)  07/29/2019, 2:08 PM   I have seen the patient reviewed the above note.  Assessment:  This is a 59 year old female with a significant past medical history of DVT, pulmonary embolism and on chronic Xarelto who presents with several weeks of visual symptoms.  Her MRI reveals what appears to be bilateral posterior watershed infarcts.  Though cardio emboli are possible, I think they are less likely to  provide such as symmetric appearing watershed pattern.  She will need vascular imaging to see if there is a reason for the predisposition to the posterior watershed.  Her chest pain and episodes of lightheadedness are concerning, and though she has not truly syncopized, her symptoms originally started arising from sleep so it is possible she had a hypotensive episode(e.g. arrhythmia) during sleep which was not noticed until she woke up.  Impression:  -Cardioembolic infarcts  Recommend -MRA head and neck  -Transthoracic Echo, may need TEE and loop monitor  -Would favor heparin as opposed to Xarelto in the short-term -BP goal: permissive HTN upto 220/120 mmHg -HBAIC and Lipid profile -Telemetry monitoring -Frequent neuro checks -NPO until passes stroke swallow screen -PT/OT # please page stroke NP  Or  PA  Or MD from 8am -4 pm  as this patient from this time will be  followed by the stroke.   You can look them up on [email or web address]  Password [email or web address]  Roland Rack, MD [facility] [phone]  If 7pm- 7am, please page neurology on call as listed in [place].

## 2019-07-29 NOTE — ED Triage Notes (Signed)
Pt sent by PCP for evaluation of multiple strokes on an MRI that was done this morning. Pt having arm numbness, chest tightness and blurred vision for the past week. Pt a.o, ambulatory at this time. Hx of DVTS and is taking xarelto, pt forgot to take xarelto this morning.

## 2019-07-29 NOTE — Progress Notes (Signed)
ANTICOAGULATION CONSULT NOTE - Initial Consult  Pharmacy Consult for heparin IV Indication: stroke  Allergies  Allergen Reactions  . Bee Venom Anaphylaxis  . Adhesive [Tape] Other (See Comments)    Irritation and red    Patient Measurements:   Heparin Dosing Weight: 66.5 kg  Vital Signs: Temp: 98.1 F (36.7 C) (02/16 1050) Temp Source: Oral (02/16 1050) BP: 113/71 (02/16 1730) Pulse Rate: 66 (02/16 1730)  Labs: Recent Labs    07/29/19 1102  HGB 12.8  HCT 39.5  PLT 179  APTT 28  LABPROT 14.9  INR 1.2  CREATININE 0.91    Estimated Creatinine Clearance: 61.5 mL/min (by C-G formula based on SCr of 0.91 mg/dL).   Medical History: Past Medical History:  Diagnosis Date  . Allergy   . DVT (deep venous thrombosis) ([place])    lower extremity   . Endometrial ca ([place]) 03/14/2017  . Family history of adverse reaction to anesthesia    children had N/V   . Hypothyroidism    Hashimoto  . Lichen sclerosus   . MRSA (methicillin resistant staph aureus) culture positive   . Pulmonary embolism ([place]) 01/13/2016   After car trip    Medications:  (Not in a hospital admission)  Scheduled:  .  stroke: mapping our early stages of recovery book   Does not apply Once  . thyroid  60 mg Oral See admin instructions   Infusions:  . sodium chloride      Assessment: 59 y/o female on Xarelto PTA for h/o DVTs sent to the ED for evaluation for stroke; MRI showed multiple acute/subacute strokes. Last dose of Xarelto was 2/15 at 0900. Patient missed her dose this morning.   Hemoglobin and platelets WNL. Baseline aPTT 28. Baseline heparin level ordered. No bleeding noted at this time.   Goal of Therapy:  APTT 66-85s Heparin level 0.3-0.5 units/ml Monitor platelets by anticoagulation protocol: Yes   Plan:  Start heparin infusion at 800 units/hr  Check heparin level and aPTT in 6 hours Check daily heparin level, aPTT, CBC Monitor for s/sx of bleeding  Agnes Lawrence, PharmD PGY1  Pharmacy Resident

## 2019-07-29 NOTE — ED Notes (Signed)
Pt transported to MRI 

## 2019-07-29 NOTE — Telephone Encounter (Signed)
FYI

## 2019-07-29 NOTE — Telephone Encounter (Signed)
Sherry Williams from Dr. Armandina Gemma office (radiology) has found critical findings and would like to know if pt should go to ED.  [facility] Radiology  [phone]

## 2019-07-29 NOTE — H&P (Signed)
History and Physical    DOA: 07/29/2019  PCP: Eulas Post, MD  Patient coming from: home  Chief Complaint: Referred to ED by PCP due to abnormal MRI  HPI: Sherry Williams is a 59 y.o. 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  Patient has been having symptoms of transient numbness, altered sensorium in her extremities as well as vision changes, headaches over the last week.  She reports an episode of pleuritic right-sided chest pain 2 days back lasting about 10 minutes, not associated with nausea or vomiting or palpitations.  She last saw her PCP on 2/10 and reported 2 separate episodes of visual disturbance and one episode of vertigo.  She was scheduled for outpatient MRI and advised to continue Xarelto.  MRI today revealed multiple strokes in multiple vascular territories consistent with embolic pattern.  Radiology staff contacted PCP and patient was directly referred to the ED here for further evaluation and management.   She states she had transient tingling sensation in the right forearm while in the ED lasting less than a minute.She currently denies any focal symptoms and appears comfortable talking to her husband on the phone.  Patient states she was diagnosed with DVT/PE in 2017 (apparently had travel history at that time) for which she was prescribed Xarelto.  She had seen Dr. Marin Olp for hypercoagulable work-up at that time and taken off anticoagulation after a year.  Sometime around December 2020, patient had another episode of right lower extremity DVT which prompted resumption of Xarelto.  She states she had several superficial thrombi in upper and lower extremities between 2018-2019 but was told did not need treatment.  She has family history of unprovoked clots  in her sister.   Review of Systems: As per HPI otherwise 10 point review of systems negative.    Past Medical History:  Diagnosis Date  . Allergy   . DVT (deep venous thrombosis) ([place])    lower extremity   . Endometrial ca ([place]) 03/14/2017  . Family history of adverse reaction to anesthesia    children had N/V   . Hypothyroidism    Hashimoto  . Lichen sclerosus   . MRSA (methicillin resistant staph aureus) culture positive   . Pulmonary embolism ([place]) 01/13/2016   After car trip    Past Surgical History:  Procedure Laterality Date  . CYSTOSCOPY WITH RETROGRADE PYELOGRAM, URETEROSCOPY AND STENT PLACEMENT Left 05/18/2018   Procedure: CYSTOSCOPY WITH RETROGRADE PYELOGRAM, LEFT URETEROSCOPY AND LEFT URETERAL STENT PLACEMENT;  Surgeon: [name], MD;  Location: [facility];  Service: Urology;  Laterality: Left;  . CYSTOSCOPY WITH RETROGRADE PYELOGRAM, URETEROSCOPY AND STENT PLACEMENT Right 05/30/2018   Procedure: CYSTOSCOPY WITH RIGHT RETROGRADE PYELOGRAM, URETEROSCOPY LASER LITHOTRIPSY AND STENT PLACEMENT;  Surgeon: [name], MD;  Location: [facility];  Service: Urology;  Laterality: Right;  . HOLMIUM LASER APPLICATION Left [id number]   Procedure: HOLMIUM LASER APPLICATION;  Surgeon: [name], MD;  Location: [facility];  Service: Urology;  Laterality: Left;  . HOLMIUM LASER APPLICATION Right [id number]   Procedure: HOLMIUM LASER APPLICATION;  Surgeon: [name], MD;  Location: [facility];  Service: Urology;  Laterality: Right;  . MOUTH SURGERY    . ROBOTIC ASSISTED TOTAL HYSTERECTOMY WITH BILATERAL SALPINGO OOPHERECTOMY Bilateral 04/17/2017  Procedure: XI ROBOTIC ASSISTED TOTAL HYSTERECTOMY WITH BILATERAL SALPINGO OOPHORECTOMY WITH SENTINAL LYMPH NODE BIOPSY AND POSSIBLE LYMPHADENECTOMY;  Surgeon: [name], MD;  Location: [facility];  Service: Gynecology;  Laterality: Bilateral;    Social history:  reports that she has  never smoked. She has never used smokeless tobacco. She reports current alcohol use. She reports that she does not use drugs.   Allergies  Allergen Reactions  . Bee Venom Anaphylaxis  . Adhesive [Tape] Other (See Comments)    Irritation and red    Family History  Problem Relation Age of Onset  . Stroke Father   . Alzheimer's disease Father   . Hyperlipidemia Father   . Deep vein thrombosis Sister   . Breast cancer Paternal Grandmother        In her 24s, but lived to be very elderly  . Arthritis Mother   . Lung cancer Maternal Grandmother   . Diabetes Paternal Grandfather   . Pancreatic cancer Paternal Grandfather       Prior to Admission medications   Medication Sig Start Date End Date Taking? Authorizing Provider  ARMOUR THYROID 60 MG tablet TAKE 1 TABLET BY MOUTH EVERY DAY BEFORE BREAKFAST. TAKE 1 DAILY 5 DAYS PER WEEK Patient taking differently: Take 60 mg by mouth See admin instructions. Taking 60mg  5 days a week 11/05/18  Yes Burchette, Alinda Sierras, MD  ARMOUR THYROID 90 MG tablet TAKE 1 TABLET BY MOUTH EVERY DAY TWO DAYS PER WEEK Patient taking differently: Take 90 mg by mouth See admin instructions. Taking 90mg  2 days a week  ( could not provide schedule) 05/05/19  Yes Burchette, Alinda Sierras, MD  rivaroxaban (XARELTO) 20 MG TABS tablet Take 1 tablet (20 mg total) by mouth daily with supper. 06/10/19  Yes Burchette, Alinda Sierras, MD    Physical Exam: Vitals:   07/29/19 1330 07/29/19 1400 07/29/19 1430 07/29/19 1730  BP: 125/77 123/89 132/63 113/71  Pulse: 62 69 71 66  Resp: 15 14 16 13   Temp:      TempSrc:      SpO2: 100% 98% 100% 97%    Constitutional: NAD, calm, comfortable Eyes: PERRL, lids and conjunctivae normal ENMT: Mucous membranes are moist. Posterior pharynx clear of any exudate or lesions.Normal dentition.  Neck: normal, supple, no masses, no thyromegaly Respiratory: clear to auscultation bilaterally, no wheezing, no crackles. Normal respiratory effort. No  accessory muscle use.  Cardiovascular: Regular rate and rhythm, no murmurs / rubs / gallops. No extremity edema. 2+ pedal pulses. No carotid bruits.  Abdomen: no tenderness, no masses palpated. No hepatosplenomegaly. Bowel sounds positive.  Musculoskeletal: no clubbing / cyanosis. No joint deformity upper and lower extremities. Good ROM, no contractures. Normal muscle tone.  Neurologic: CN 2-12 grossly intact. Sensation intact, DTR normal. Strength 5/5 in all 4.  Psychiatric: Normal judgment and insight. Alert and oriented x 3. Normal mood.  SKIN/catheters: no rashes, lesions, ulcers. No induration  Labs on Admission: I have personally reviewed following labs and imaging studies  CBC: Recent Labs  Lab 07/23/19 1524 07/29/19 1102  WBC 4.7 5.5  NEUTROABS 2.9 3.6  HGB 12.6 12.8  HCT 37.7 39.5  MCV 87.5 89.6  PLT 178.0 0000000   Basic Metabolic Panel: Recent Labs  Lab 07/23/19 1524 07/29/19 1102  NA 141 142  K 3.9 3.8  CL 103 105  CO2 31 27  GLUCOSE 104* 96  BUN 19 14  CREATININE 1.03 0.91  CALCIUM 9.7 9.6   GFR: Estimated Creatinine Clearance: 61.5  mL/min (by C-G formula based on SCr of 0.91 mg/dL). Recent Labs  Lab 07/23/19 1524 07/29/19 1102  WBC 4.7 5.5   Liver Function Tests: Recent Labs  Lab 07/23/19 1524 07/29/19 1102  AST 21 26  ALT 17 20  ALKPHOS 68 64  BILITOT 0.4 0.5  PROT 6.9 6.7  ALBUMIN 4.4 4.1   No results for input(s): LIPASE, AMYLASE in the last 168 hours. No results for input(s): AMMONIA in the last 168 hours. Coagulation Profile: Recent Labs  Lab 07/29/19 1102  INR 1.2   Cardiac Enzymes: No results for input(s): CKTOTAL, CKMB, CKMBINDEX, TROPONINI in the last 168 hours. BNP (last 3 results) No results for input(s): PROBNP in the last 8760 hours. HbA1C: No results for input(s): HGBA1C in the last 72 hours. CBG: No results for input(s): GLUCAP in the last 168 hours. Lipid Profile: No results for input(s): CHOL, HDL, LDLCALC, TRIG,  CHOLHDL, LDLDIRECT in the last 72 hours. Thyroid Function Tests: No results for input(s): TSH, T4TOTAL, FREET4, T3FREE, THYROIDAB in the last 72 hours. Anemia Panel: No results for input(s): VITAMINB12, FOLATE, FERRITIN, TIBC, IRON, RETICCTPCT in the last 72 hours. Urine analysis:    Component Value Date/Time   COLORURINE AMBER (A) 06/01/2018 2324   APPEARANCEUR CLEAR 06/01/2018 2324   LABSPEC 1.005 06/01/2018 2324   LABSPEC 1.005 08/14/2016 1525   PHURINE 8.0 06/01/2018 2324   GLUCOSEU NEGATIVE 06/01/2018 2324   HGBUR LARGE (A) 06/01/2018 2324   BILIRUBINUR NEGATIVE 06/01/2018 2324   KETONESUR NEGATIVE 06/01/2018 2324   PROTEINUR 30 (A) 06/01/2018 2324   UROBILINOGEN 0.2 08/14/2016 1525   NITRITE POSITIVE (A) 06/01/2018 2324   LEUKOCYTESUR NEGATIVE 06/01/2018 2324    Radiological Exams on Admission: Personally reviewed  CT Angio Chest PE W/Cm &/Or Wo Cm  Result Date: 07/29/2019 CLINICAL DATA:  Shortness of breath EXAM: CT ANGIOGRAPHY CHEST WITH CONTRAST TECHNIQUE: Multidetector CT imaging of the chest was performed using the standard protocol during bolus administration of intravenous contrast. Multiplanar CT image reconstructions and MIPs were obtained to evaluate the vascular anatomy. CONTRAST:  11mL OMNIPAQUE IOHEXOL 350 MG/ML SOLN COMPARISON:  CT 11/14/2017 FINDINGS: Cardiovascular: Satisfactory opacification of the pulmonary arteries to the segmental level. No evidence of pulmonary embolism. Normal heart size. No pericardial effusion. Thoracic aorta is nonaneurysmal. Mediastinum/Nodes: No enlarged mediastinal, hilar, or axillary lymph nodes. Thyroid gland, trachea, and esophagus demonstrate no significant findings. Lungs/Pleura: The lungs are clear without focal airspace consolidation, pleural effusion, or pneumothorax. Minimal linear scarring in the anterior aspect of the right middle lobe, unchanged. Upper Abdomen: No acute abnormality. Musculoskeletal: No chest wall abnormality. No  acute or significant osseous findings. Review of the MIP images confirms the above findings. IMPRESSION: 1. Negative for pulmonary embolism. 2. No active pulmonary disease. Electronically Signed   By: Davina Poke D.O.   On: 07/29/2019 15:22   MR Brain Wo Contrast  Addendum Date: 07/29/2019   ADDENDUM REPORT: 07/29/2019 09:51 ADDENDUM: These results were called by telephone at the time of interpretation on 07/29/2019 at 9:51 am to provider Carolann Littler , who verbally acknowledged these results. Electronically Signed   By: Kellie Simmering DO   On: 07/29/2019 09:51   Result Date: 07/29/2019 CLINICAL DATA:  Other symptoms and signs involving the nervous system. Intermittent visual disturbance, recent headache; neuro deficit, subacute. Additional history provided by technologist: Intermittent visual changes, 2 episodes of right arm heaviness, dizziness and some bilateral hearing loss, history of uterine cancer 2018 EXAM: MRI HEAD WITHOUT CONTRAST TECHNIQUE:  Multiplanar, multiecho pulse sequences of the brain and surrounding structures were obtained without intravenous contrast. COMPARISON:  No pertinent prior studies available for comparison. FINDINGS: Brain: There are numerous small foci of restricted diffusion consistent with acute/early subacute infarct. These infarcts are predominantly cortically based within the bilateral parietal lobes. However, there are additional small acute/early subacute infarcts within the parietal white matter, cortical and subcortical posterolateral left frontal lobe, within the anterior right frontal lobe cortex, and within the bilateral occipital lobe cortex, as well as left caudate head. Corresponding T2/FLAIR hyperintensity at these sites. Additionally, there is a small amount of precontrast T1 hyperintensity within the right parietal cortex which may reflect cortical laminar necrosis. No chronic intracranial blood products. There is no significant mass effect. No effacement  of the ventricular system or midline shift. No extra-axial fluid collection. No evidence of intracranial mass. Background mild scattered T2/FLAIR hyperintensity within the cerebral white matter is nonspecific, but consistent with chronic small vessel ischemic disease. Cerebral volume is normal for age. Vascular: Flow voids maintained within the proximal large arterial vessels. Skull and upper cervical spine: No focal marrow lesion. Sinuses/Orbits: Visualized orbits demonstrate no acute abnormality. Minimal ethmoid and maxillary sinus mucosal thickening. No significant mastoid effusion. IMPRESSION: Numerous small acute/early subacute infarcts predominantly located within the bilateral parietal cortex. Additional small acute/early subacute infarcts within the parietal white matter, posterolateral left frontal lobe, anterior right frontal lobe and bilateral occipital lobes as well as left caudate nucleus. Given involvement of multiple vascular territories, correlate for an embolic process. No significant mass effect.  No evidence of hemorrhagic conversion. Background mild chronic small vessel ischemic disease. Electronically Signed: By: Kellie Simmering DO On: 07/29/2019 09:46    EKG: Independently reviewed.      Assessment and Plan:   Principal Problem:   CVA (cerebral vascular accident) ([place]) Active Problems:   Hypothyroidism, acquired, autoimmune   Pulmonary embolus ([place])   DVT (deep venous thrombosis) ([place])    1.  Acute CVA, embolic pattern: MRI findings as above showing multiple new infarcts in different vascular territories consistent with embolic process.  Discussed with neurology who feel she might have had watershed infarcts from hypotension as well.  Patient currently normotensive.  Will check orthostatics.  Per discussion with Dr. Leonel Ramsay admit with IV heparin until further long-term anticoagulation course determined.  Patient did not take Xarelto today.  No evidence of hemorrhagic  conversion on CT or MRI.  Lipid profile in a.m.  Statins only if LDL elevated per discussion with neurology.  Follow-up MRA head/neck results.  Patient nonfocal currently, consult PT/OT for discharge needs if any.  2.  History of recurrent DVT/PE: Per patient hypercoagulable work-up negative in the past.  Given atypical right-sided chest pain complaints recently, CTA chest obtained today which is negative for PE.  IV heparin for now.  Consider hematology evaluation as inpatient given history of prior malignancy and known to Dr. Marin Olp.  3.  Hypothyroidism: Patient reports taking Armour Thyroid 60 mg daily except for 90 mg on Saturday/Sunday.  Will resume at 60 mg for now.  She will likely be discharged home prior to weekend.  If not, adjust dose accordingly.  TSH in a.m.  4.?  Endometrial cancer mentioned in her problem list.  I do not see this diagnosis listed in prior oncology note.  Will need to corroborate with patient in a.m.  DVT prophylaxis: On heparin drip  COVID screen: Pending  Code Status: Full code.Health care proxy would be her husband  Patient/Family  Communication: Discussed with patient and all questions answered to satisfaction.  Consults called: Neurology Admission status :I certify that at the point of admission it is my clinical judgment that the patient will require inpatient hospital care spanning beyond 2 midnights from the point of admission due to high intensity of service and high frequency of surveillance required.Inpatient status is judged to be reasonable and necessary in order to provide the required intensity of service to ensure the patient's safety. The patient's presenting symptoms, physical exam findings, and initial radiographic and laboratory data in the context of their chronic comorbidities is felt to place them at high risk for further clinical deterioration. The following factors support the patient status of inpatient : Acute CVA requiring IV heparin and  further long-term anticoagulation management.     Guilford Shi MD [facility] Pager in [place]  If 7PM-7AM, please contact night-coverage [email or web address]   07/29/2019, 6:37 PM

## 2019-07-30 ENCOUNTER — Inpatient Hospital Stay (HOSPITAL_COMMUNITY): Payer: 59

## 2019-07-30 DIAGNOSIS — I639 Cerebral infarction, unspecified: Secondary | ICD-10-CM

## 2019-07-30 DIAGNOSIS — E063 Autoimmune thyroiditis: Secondary | ICD-10-CM

## 2019-07-30 DIAGNOSIS — I82409 Acute embolism and thrombosis of unspecified deep veins of unspecified lower extremity: Secondary | ICD-10-CM

## 2019-07-30 DIAGNOSIS — I6389 Other cerebral infarction: Secondary | ICD-10-CM

## 2019-07-30 DIAGNOSIS — I6300 Cerebral infarction due to thrombosis of unspecified precerebral artery: Secondary | ICD-10-CM

## 2019-07-30 LAB — CBC
HCT: 35.1 % — ABNORMAL LOW (ref 36.0–46.0)
Hemoglobin: 11.7 g/dL — ABNORMAL LOW (ref 12.0–15.0)
MCH: 29.2 pg (ref 26.0–34.0)
MCHC: 33.3 g/dL (ref 30.0–36.0)
MCV: 87.5 fL (ref 80.0–100.0)
Platelets: 165 10*3/uL (ref 150–400)
RBC: 4.01 MIL/uL (ref 3.87–5.11)
RDW: 12.7 % (ref 11.5–15.5)
WBC: 5.5 10*3/uL (ref 4.0–10.5)
nRBC: 0 % (ref 0.0–0.2)

## 2019-07-30 LAB — ECHOCARDIOGRAM COMPLETE

## 2019-07-30 LAB — APTT
aPTT: 127 seconds — ABNORMAL HIGH (ref 24–36)
aPTT: 57 seconds — ABNORMAL HIGH (ref 24–36)
aPTT: 66 seconds — ABNORMAL HIGH (ref 24–36)

## 2019-07-30 LAB — LIPID PANEL
Cholesterol: 172 mg/dL (ref 0–200)
HDL: 58 mg/dL (ref >40)
LDL Cholesterol: 107 mg/dL — ABNORMAL HIGH (ref 0–99)
Total CHOL/HDL Ratio: 3 RATIO
Triglycerides: 37 mg/dL (ref <150)
VLDL: 7 mg/dL (ref 0–40)

## 2019-07-30 LAB — HEMOGLOBIN A1C
Hgb A1c MFr Bld: 5.6 % (ref 4.8–5.6)
Mean Plasma Glucose: 114.02 mg/dL

## 2019-07-30 LAB — HEPARIN LEVEL (UNFRACTIONATED)
Heparin Unfractionated: 0.39 IU/mL (ref 0.30–0.70)
Heparin Unfractionated: 0.91 IU/mL — ABNORMAL HIGH (ref 0.30–0.70)

## 2019-07-30 MED ORDER — ATORVASTATIN CALCIUM 40 MG PO TABS
40.0000 mg | ORAL_TABLET | Freq: Every day | ORAL | Status: DC
  Administered 2019-07-30 – 2019-07-31 (×2): 40 mg via ORAL
  Filled 2019-07-30 (×2): qty 1

## 2019-07-30 NOTE — Progress Notes (Signed)
[facility] for Heparin (Xarelto on Hold) Indication: stroke, hx DVT  Allergies  Allergen Reactions  . Bee Venom Anaphylaxis  . Adhesive [Tape] Other (See Comments)    Irritation and red    Patient Measurements: Heparin Dosing Weight: 66.5 kg  Vital Signs: Temp: 97.9 F (36.6 C) (02/17 1942) Temp Source: Oral (02/17 1942) BP: 123/76 (02/17 1942) Pulse Rate: 64 (02/17 1942)  Labs: Recent Labs    07/29/19 1102 07/29/19 1102 07/29/19 2040 07/29/19 2344 07/30/19 0239 07/30/19 0945 07/30/19 2103  HGB 12.8  --   --   --  11.7*  --   --   HCT 39.5  --   --   --  35.1*  --   --   PLT 179  --   --   --  165  --   --   APTT 28   < >  --  57*  --  127* 66*  LABPROT 14.9  --   --   --   --   --   --   INR 1.2  --   --   --   --   --   --   HEPARINUNFRC  --   --  0.21* 0.39  --  0.91*  --   CREATININE 0.91  --   --   --   --   --   --    < > = values in this interval not displayed.    Estimated Creatinine Clearance: 61.5 mL/min (by C-G formula based on SCr of 0.91 mg/dL).   Medical History: Past Medical History:  Diagnosis Date  . Allergy   . DVT (deep venous thrombosis) ([place])    lower extremity   . Endometrial ca ([place]) 03/14/2017  . Family history of adverse reaction to anesthesia    children had N/V   . Hypothyroidism    Hashimoto  . Lichen sclerosus   . MRSA (methicillin resistant staph aureus) culture positive   . Pulmonary embolism ([place]) 01/13/2016   After car trip    Assessment: 59 yr old female on Xarelto PTA for h/o DVTs sent to the ED for evaluation for stroke; MRI showed multiple acute/subacute strokes. Last dose of Xarelto was 2/15 at 0900.  aPTT ~8.5 hrs after heparin infusion was reduced to 750 units/hr was 66 sec, which is at the low end of the goal range for this pt. H/H 10.4/32.0, 211. Per RN, no issues with IV or bleeding observed.  Goal of Therapy:  aPTT 66-85s Heparin level 0.3-0.5 units/ml Monitor  platelets by anticoagulation protocol: Yes   Plan:  Increase heparin infusion to 800 units/hr Check aPTT, heparin level with AM labs Monitor daily aPTT, heparin level, CBC Monitor for signs/symptoms of bleeding  Gillermina Hu, PharmD, BCPS, [facility] Clinical Pharmacist 07/30/19, 21:48 PM

## 2019-07-30 NOTE — Evaluation (Signed)
Physical Therapy Evaluation Sherry Williams Details Name: Sherry Williams MRN: [id number] DOB: 1960/11/13 Today's Date: 07/30/2019   History of Present Illness  Pt is a 59 y.o. 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   Clinical Impression  Sherry Williams evaluated by Physical Therapy with no further acute PT needs identified. Pt denying residual deficits. Ambulating hallway distances and performing high level balance activities independently. All education has been completed and the Sherry Williams has no further questions. No follow-up Physical Therapy or equipment needs. PT is signing off. Thank you for this referral.     Follow Up Recommendations No PT follow up    Equipment Recommendations  None recommended by PT    Recommendations for Other Services       Precautions / Restrictions Precautions Precautions: None Restrictions Weight Bearing Restrictions: No      Mobility  Bed Mobility Overal bed mobility: Independent                Transfers Overall transfer level: Independent Equipment used: None                Ambulation/Gait Ambulation/Gait assistance: Independent Gait Distance (Feet): 500 Feet Assistive device: None Gait Pattern/deviations: WFL(Within Functional Limits)   Gait velocity interpretation: >2.62 ft/sec, indicative of community ambulatory    Stairs            Wheelchair Mobility    Modified Rankin (Stroke Patients Only) Modified Rankin (Stroke Patients Only) Pre-Morbid Rankin Score: No symptoms Modified Rankin: No symptoms     Balance Overall balance assessment: No apparent balance deficits (not formally assessed)                                           Pertinent Vitals/Pain Pain Assessment: No/denies pain    Home Living Family/Sherry Williams expects to be discharged to:: Private residence Living Arrangements:  Spouse/significant other Available Help at Discharge: Family Type of Home: House Home Access: Stairs to enter   Technical brewer of Steps: 4 Home Layout: Two level(split level)        Prior Function Level of Independence: Independent         Comments: Works from home in [employer]        Extremity/Trunk Assessment   Upper Extremity Assessment Upper Extremity Assessment: Overall WFL for tasks assessed    Lower Extremity Assessment Lower Extremity Assessment: Overall WFL for tasks assessed    Cervical / Trunk Assessment Cervical / Trunk Assessment: Normal  Communication   Communication: No difficulties  Cognition Arousal/Alertness: Awake/alert Behavior During Therapy: WFL for tasks assessed/performed Overall Cognitive Status: Within Functional Limits for tasks assessed                                        General Comments      Exercises     Assessment/Plan    PT Assessment [place] does not need any further PT services  PT Problem List         PT Treatment Interventions      PT Goals (Current goals can be found in the Care Plan section)  Acute Rehab PT Goals Sherry Williams Stated Goal: "still be able to  drive." PT Goal Formulation: All assessment and education complete, DC therapy    Frequency     Barriers to discharge        Co-evaluation               AM-PAC PT "6 Clicks" Mobility  Outcome Measure Help needed turning from your back to your side while in a flat bed without using bedrails?: None Help needed moving from lying on your back to sitting on the side of a flat bed without using bedrails?: None Help needed moving to and from a bed to a chair (including a wheelchair)?: None Help needed standing up from a chair using your arms (e.g., wheelchair or bedside chair)?: None Help needed to walk in hospital room?: None Help needed climbing 3-5 steps with a railing? : None 6 Click Score: 24    End of  Session   Activity Tolerance: Sherry Williams tolerated treatment well Sherry Williams left: in bed;with call bell/phone within reach Nurse Communication: Mobility status PT Visit Diagnosis: Other symptoms and signs involving the nervous system (R29.898)    Time: [id number] PT Time Calculation (min) (ACUTE ONLY): 22 min   Charges:   PT Evaluation $PT Eval Low Complexity: Hill, PT, DPT Acute Rehabilitation Services Pager [phone] Office [phone]   Deno Etienne 07/30/2019, 1:37 PM

## 2019-07-30 NOTE — Progress Notes (Signed)
SLP Cancellation Note  Patient Details Name: Sherry Williams MRN: [id number] DOB: Aug 31, 1960   Cancelled treatment:       Reason Eval/Treat Not Completed: SLP screened, no needs identified, will sign off   Carnell Beavers 07/30/2019, 12:02 PM

## 2019-07-30 NOTE — Progress Notes (Signed)
[place] for Heparin (Xarelto on hold) Indication: stroke, hx DVT  Allergies  Allergen Reactions  . Bee Venom Anaphylaxis  . Adhesive [Tape] Other (See Comments)    Irritation and red    Patient Measurements:   Heparin Dosing Weight: 66.5 kg  Vital Signs: Temp: 98.3 F (36.8 C) (02/17 1125) Temp Source: Oral (02/17 1125) BP: 112/77 (02/17 1125) Pulse Rate: 70 (02/17 1125)  Labs: Recent Labs    07/29/19 1102 07/29/19 2040 07/29/19 2344 07/30/19 0239 07/30/19 0945  HGB 12.8  --   --  11.7*  --   HCT 39.5  --   --  35.1*  --   PLT 179  --   --  165  --   APTT 28  --  57*  --  127*  LABPROT 14.9  --   --   --   --   INR 1.2  --   --   --   --   HEPARINUNFRC  --  0.21* 0.39  --  0.91*  CREATININE 0.91  --   --   --   --     Estimated Creatinine Clearance: 61.5 mL/min (by C-G formula based on SCr of 0.91 mg/dL).   Medical History: Past Medical History:  Diagnosis Date  . Allergy   . DVT (deep venous thrombosis) ([place])    lower extremity   . Endometrial ca ([place]) 03/14/2017  . Family history of adverse reaction to anesthesia    children had N/V   . Hypothyroidism    Hashimoto  . Lichen sclerosus   . MRSA (methicillin resistant staph aureus) culture positive   . Pulmonary embolism ([place]) 01/13/2016   After car trip      Assessment: 59 y/o female on Xarelto PTA for h/o DVTs sent to the ED for evaluation for stroke; MRI showed multiple acute/subacute strokes. Last dose of Xarelto was 2/15 at 0900. Patient missed her dose this morning.   Hemoglobin and platelets WNL. Baseline aPTT 28. Baseline heparin level ordered. No bleeding noted at this time.   PTT no above goal at 127 seconds   Goal of Therapy:  APTT 66-85s Heparin level 0.3-0.5 units/ml Monitor platelets by anticoagulation protocol: Yes   Plan:  Decrease heparin to 750 units / hr PTT at 2100 pm Check daily heparin level, aPTT, CBC Monitor for s/sx of  bleeding  Thank you Anette Guarneri, PharmD

## 2019-07-30 NOTE — H&P (View-Only) (Signed)
STROKE TEAM PROGRESS NOTE   INTERVAL HISTORY I have personally reviewed history of presenting illness with the patient and her husband, reviewed imaging films in PACS and electronic medical records.  She presented with positive visual disturbance in the left field of vision following an episode in which she got emotionally upset and thought she had an anxiety attack.  She did not take her pulse and cannot tell me if she had palpitations or not.  MRI scan of the brain interestingly shows multiple small bilateral embolic-looking infarcts.  She has no known history of PFO but history of cardiac murmur as a child.  Vitals:   07/30/19 0045 07/30/19 0300 07/30/19 0500 07/30/19 0745  BP: 110/76 (!) 105/58 117/72 106/72  Pulse: 65 63 65 67  Resp: 18   17  Temp:   98 F (36.7 C) 98 F (36.7 C)  TempSrc:   Oral Oral  SpO2: 98% 100% 98% 99%    CBC:  Recent Labs  Lab 07/23/19 1524 07/23/19 1524 07/29/19 1102 07/30/19 0239  WBC 4.7   < > 5.5 5.5  NEUTROABS 2.9  --  3.6  --   HGB 12.6   < > 12.8 11.7*  HCT 37.7   < > 39.5 35.1*  MCV 87.5   < > 89.6 87.5  PLT 178.0   < > 179 165   < > = values in this interval not displayed.    Basic Metabolic Panel:  Recent Labs  Lab 07/23/19 1524 07/29/19 1102  NA 141 142  K 3.9 3.8  CL 103 105  CO2 31 27  GLUCOSE 104* 96  BUN 19 14  CREATININE 1.03 0.91  CALCIUM 9.7 9.6   Lipid Panel:     Component Value Date/Time   CHOL 172 07/30/2019 0239   TRIG 37 07/30/2019 0239   HDL 58 07/30/2019 0239   CHOLHDL 3.0 07/30/2019 0239   VLDL 7 07/30/2019 0239   LDLCALC 107 (H) 07/30/2019 0239   HgbA1c:  Lab Results  Component Value Date   HGBA1C 5.6 07/30/2019   Urine Drug Screen: No results found for: LABOPIA, COCAINSCRNUR, LABBENZ, AMPHETMU, THCU, LABBARB  Alcohol Level No results found for: ETH  IMAGING past 48 hours CT Angio Chest PE W/Cm &/Or Wo Cm  Result Date: 07/29/2019 CLINICAL DATA:  Shortness of breath EXAM: CT ANGIOGRAPHY CHEST  WITH CONTRAST TECHNIQUE: Multidetector CT imaging of the chest was performed using the standard protocol during bolus administration of intravenous contrast. Multiplanar CT image reconstructions and MIPs were obtained to evaluate the vascular anatomy. CONTRAST:  50mL OMNIPAQUE IOHEXOL 350 MG/ML SOLN COMPARISON:  CT 11/14/2017 FINDINGS: Cardiovascular: Satisfactory opacification of the pulmonary arteries to the segmental level. No evidence of pulmonary embolism. Normal heart size. No pericardial effusion. Thoracic aorta is nonaneurysmal. Mediastinum/Nodes: No enlarged mediastinal, hilar, or axillary lymph nodes. Thyroid gland, trachea, and esophagus demonstrate no significant findings. Lungs/Pleura: The lungs are clear without focal airspace consolidation, pleural effusion, or pneumothorax. Minimal linear scarring in the anterior aspect of the right middle lobe, unchanged. Upper Abdomen: No acute abnormality. Musculoskeletal: No chest wall abnormality. No acute or significant osseous findings. Review of the MIP images confirms the above findings. IMPRESSION: 1. Negative for pulmonary embolism. 2. No active pulmonary disease. Electronically Signed   By: Davina Poke D.O.   On: 07/29/2019 15:22   MR ANGIO HEAD WO CONTRAST  Result Date: 07/29/2019 CLINICAL DATA:  59 year old female with numerous scattered small infarcts in both superior cerebral hemispheres on MRI  earlier today. EXAM: MRA HEAD WITHOUT CONTRAST TECHNIQUE: Angiographic images of the Circle of Willis were obtained using MRA technique without intravenous contrast. COMPARISON:  Neck MRA and brain MRI today reported separately. FINDINGS: Codominant distal vertebral arteries are patent to the basilar without stenosis. Patent PICA origins. Patent basilar artery, AICA and SCA origins without stenosis. Bilateral PCA branches are normal. Antegrade flow in both ICA siphons. No siphon stenosis. There is a right posterior communicating artery which appears  normal. The left is diminutive or absent. Patent carotid termini. Normal MCA and ACA origins. The right ACA 8 1 appears fenestrated (series 1017, image 12) but otherwise normal. Anterior communicating artery and visible ACA branches are within normal limits. Left MCA M1 segment bifurcates early without stenosis. Visible left MCA branches are within normal limits. Right MCA M1 and bifurcation are patent without stenosis. Visible right MCA branches are within normal limits. IMPRESSION: 1.  Negative intracranial MRA. 2. Normal variants: fenestrated right ACA A1 and early bifurcation of the left MCA. Electronically Signed   By: Genevie Ann M.D.   On: 07/29/2019 20:31   MR ANGIO NECK W WO CONTRAST  Result Date: 07/29/2019 CLINICAL DATA:  59 year old female with numerous scattered small infarcts in both superior cerebral hemispheres on MRI earlier today. EXAM: MRA NECK WITHOUT AND WITH CONTRAST TECHNIQUE: Multiplanar and multiecho pulse sequences of the neck were obtained without and with intravenous contrast. Angiographic images of the neck were obtained using MRA technique without and with intravenous contrast. CONTRAST:  61mL GADAVIST GADOBUTROL 1 MMOL/ML IV SOLN COMPARISON:  Brain MRI earlier today. FINDINGS: Precontrast time-of-flight images demonstrate antegrade flow in both cervical carotid and vertebral arteries. The vertebral arteries are codominant with antegrade flow continuing to the upper cervical spine. Carotid bifurcations appear within normal limits. Post-contrast neck MRA images reveal a 3 vessel arch configuration. There is some motion artifact at the proximal great vessels but no definite hemodynamically significant proximal great vessel stenosis. The right CCA, right carotid bifurcation and cervical right ICA appear normal for age. Visible right ICA siphon and terminus appear negative. The proximal left CCA is partially obscured. Beyond that the left CCA, left carotid bifurcation and cervical left ICA  are within normal limits for age. Negative visible left ICA siphon and terminus. No proximal subclavian artery or vertebral artery origin stenosis. Fairly codominant vertebral arteries are patent to the vertebrobasilar junction without stenosis. IMPRESSION: Negative Neck MRA other than suspected artifact at the proximal left CCA. Electronically Signed   By: Genevie Ann M.D.   On: 07/29/2019 20:28   MR Brain Wo Contrast  Addendum Date: 07/29/2019   ADDENDUM REPORT: 07/29/2019 09:51 ADDENDUM: These results were called by telephone at the time of interpretation on 07/29/2019 at 9:51 am to provider Carolann Littler , who verbally acknowledged these results. Electronically Signed   By: Kellie Simmering DO   On: 07/29/2019 09:51   Result Date: 07/29/2019 CLINICAL DATA:  Other symptoms and signs involving the nervous system. Intermittent visual disturbance, recent headache; neuro deficit, subacute. Additional history provided by technologist: Intermittent visual changes, 2 episodes of right arm heaviness, dizziness and some bilateral hearing loss, history of uterine cancer 2018 EXAM: MRI HEAD WITHOUT CONTRAST TECHNIQUE: Multiplanar, multiecho pulse sequences of the brain and surrounding structures were obtained without intravenous contrast. COMPARISON:  No pertinent prior studies available for comparison. FINDINGS: Brain: There are numerous small foci of restricted diffusion consistent with acute/early subacute infarct. These infarcts are predominantly cortically based within the bilateral parietal lobes.  However, there are additional small acute/early subacute infarcts within the parietal white matter, cortical and subcortical posterolateral left frontal lobe, within the anterior right frontal lobe cortex, and within the bilateral occipital lobe cortex, as well as left caudate head. Corresponding T2/FLAIR hyperintensity at these sites. Additionally, there is a small amount of precontrast T1 hyperintensity within the right  parietal cortex which may reflect cortical laminar necrosis. No chronic intracranial blood products. There is no significant mass effect. No effacement of the ventricular system or midline shift. No extra-axial fluid collection. No evidence of intracranial mass. Background mild scattered T2/FLAIR hyperintensity within the cerebral white matter is nonspecific, but consistent with chronic small vessel ischemic disease. Cerebral volume is normal for age. Vascular: Flow voids maintained within the proximal large arterial vessels. Skull and upper cervical spine: No focal marrow lesion. Sinuses/Orbits: Visualized orbits demonstrate no acute abnormality. Minimal ethmoid and maxillary sinus mucosal thickening. No significant mastoid effusion. IMPRESSION: Numerous small acute/early subacute infarcts predominantly located within the bilateral parietal cortex. Additional small acute/early subacute infarcts within the parietal white matter, posterolateral left frontal lobe, anterior right frontal lobe and bilateral occipital lobes as well as left caudate nucleus. Given involvement of multiple vascular territories, correlate for an embolic process. No significant mass effect.  No evidence of hemorrhagic conversion. Background mild chronic small vessel ischemic disease. Electronically Signed: By: Kellie Simmering DO On: 07/29/2019 09:46    PHYSICAL EXAM Pleasant middle-aged Caucasian lady not in distress. . Afebrile. Head is nontraumatic. Neck is supple without bruit.    Cardiac exam no murmur or gallop. Lungs are clear to auscultation. Distal pulses are well felt. Neurological Exam ;  Awake  Alert oriented x 3.  Diminished recall 2/3.  Able to name 10 animals which can walk on 4 legs.  Normal speech and language.eye movements full without nystagmus.fundi were not visualized. Vision acuity and fields appear normal. Hearing is normal. Palatal movements are normal. Face symmetric. Tongue midline. Normal strength, tone, reflexes  and coordination. Normal sensation. Gait deferred.  ASSESSMENT/PLAN Ms. Cassidey Vanfleet is a 59 y.o. 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 OP MRI showed multiple B infarcts.   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 Normal variants.  MRA neck  Negative. Artifact L CCA  LE doppler  pending   Transcranial Doppler w/ bubble performed personally at the bedside was negative for right-to-left shunt  2D Echo EF 60-65%. No source of embolus   TEE to look for PFO. Arranged with [facility] for tomorrow.   (I have made patient NPO after midnight tonight). ROPE SCORE 5-6  LDL 107  HgbA1c 5.6  IV heparin for VTE prophylaxis  Xarelto (rivaroxaban) daily prior to admission, now on heparin IV.   Therapy recommendations:  No therapy needs  Disposition:  Return home  Hypercoagulable State  Hx PE 2017, genetic workup neg.   SVTs arms and legs fall 2020  RLE DVT 05/2019.   On on xarelto PTA - no on IV heparin  Bilateral LE dopplers pending  TCD bubble negative for PFO  TEE to look for PFO. Arranged  with [place] for tomorrow.   (I have made patient NPO after midnight tonight).    Followed by Dr. Marin Olp  Hyperlipidemia  Home meds:  No statin  On lipitor 40  LDL 107, goal < 70  Continue statin at discharge  Other Stroke Risk Factors  ETOH use,  advised to drink no more than 1 drink(s) a day  Family hx stroke (father)  Other Active Problems  Hypothyroid    Hx endometrial cancer  Hospital day # 1  I have personally  obtained history,examined this patient, reviewed notes, independently viewed imaging studies, participated in medical decision making and plan of care.ROS completed by me personally and pertinent positives fully documented  I have made any additions or clarifications directly to the above note.  She has history of recurrent DVT and pulmonary embolism and was on Xarelto and compliant with it but taking it with breakfast.  MRI shows by cerebral embolic infarcts source unclear but not paradoxical embolism as TCD bubble study was negative for PFO.  Recommend check TEE for other cardiac sources of embolism.  Consult Dr. Marin Olp patient's hematologist to consider alternatives to Xarelto for long-term anticoagulation.  Check lower extremity venous Dopplers to see if those clots have resolved with Xarelto.  Long discussion with the patient and husband at the bedside and with Dr. Horris Latino and answered questions.  Greater than 50% time during this 35-minute visit was spent in counseling and coordination of care about her embolic strokes and discussion about evaluation, treatment and answering questions  Antony Contras, MD Medical Director [place] Pager: [phone] 07/30/2019 4:47 PM    To contact Stroke Continuity provider, please refer to [email or web address] After hours, contact General Neurology

## 2019-07-30 NOTE — Progress Notes (Signed)
    [facility] has been requested to perform a transesophageal echocardiogram on 07/31/19 for Stroke.  After careful review of history and examination, the risks and benefits of transesophageal echocardiogram have been explained including risks of esophageal damage, perforation (1:10,000 risk), bleeding, pharyngeal hematoma as well as other potential complications associated with conscious sedation including aspiration, arrhythmia, respiratory failure and death. Alternatives to treatment were discussed, questions were answered. Patient is willing to proceed. Labs and vital signs stable.   Reino Bellis, NP-C 07/30/2019 2:15 PM

## 2019-07-30 NOTE — Evaluation (Signed)
Occupational Therapy Evaluation Patient Details Name: Sherry Williams MRN: [id number] DOB: January 05, 1961 Today's Date: 07/30/2019    History of Present Illness Pt is a 59 y.o. 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    Clinical Impression   Pt reports symptoms have resolved over last few days. Pt does not appear to have visual disturbance creating deficits within functional context. Pt at baseline and independence in all areas of self care. No further need for OT intervention at this time and pt agrees with this assessment. Thank you for referral. OT SIGNING OFF.     Follow Up Recommendations  No OT follow up    Equipment Recommendations  None recommended by OT       Precautions / Restrictions Precautions Precautions: None Restrictions Weight Bearing Restrictions: No      Mobility Bed Mobility Overal bed mobility: Independent     Transfers Overall transfer level: Independent Equipment used: None         Balance Overall balance assessment: No apparent balance deficits (not formally assessed)          ADL either performed or assessed with clinical judgement   ADL Overall ADL's : Independent    General ADL Comments: pt reports and demonstrated Independence with self care tasks and mobility. Pt reports feeling like she is back to baseline     Vision Baseline Vision/History: No visual deficits Patient Visual Report: No change from baseline Additional Comments: reports having vision changes last week but that they have resolved with no deficits noted in functional context this session            Pertinent Vitals/Pain Pain Assessment: No/denies pain     Hand Dominance Right   Extremity/Trunk Assessment Upper Extremity Assessment Upper Extremity Assessment: Overall WFL for tasks assessed   Lower Extremity Assessment Lower Extremity Assessment:  Overall WFL for tasks assessed   Cervical / Trunk Assessment Cervical / Trunk Assessment: Normal   Communication Communication Communication: No difficulties   Cognition Arousal/Alertness: Awake/alert Behavior During Therapy: WFL for tasks assessed/performed Overall Cognitive Status: Within Functional Limits for tasks assessed                  Home Living Family/patient expects to be discharged to:: Private residence Living Arrangements: Spouse/significant other Available Help at Discharge: Family Type of Home: House Home Access: Stairs to enter Technical brewer of Steps: 4   Home Layout: Two level Alternate Level Stairs-Number of Steps: 7         Prior Functioning/Environment Level of Independence: Independent        Comments: 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 can be found in the care plan section) Acute Rehab OT Goals Patient Stated Goal: be able to go home  OT Frequency:     Barriers to D/C:    no known barriers          AM-PAC OT "6 Clicks" Daily Activity     Outcome Measure Help from another person eating meals?: None Help from another person taking care of personal grooming?: None Help from another person toileting, which includes using toliet, bedpan, or urinal?: None Help from another person bathing (including washing, rinsing, drying)?: None Help from another person to put on and taking off regular upper body clothing?: None Help  from another person to put on and taking off regular lower body clothing?: None 6 Click Score: 24   End of Session    Activity Tolerance: Patient tolerated treatment well Patient left: in bed;with call bell/phone within reach                   Time: 1334-1349 OT Time Calculation (min): 15 min Charges:  OT General Charges $OT Visit: 1 Visit OT Evaluation $OT Eval Low Complexity: 1 Low  Donevin Sainsbury P MS, OTR/L 07/30/2019, 3:00 PM

## 2019-07-30 NOTE — Progress Notes (Signed)
Bilateral lower extremity venous duplex and TCD bubble study completed. Refer to "CV Proc" under chart review to view preliminary results.  07/30/2019 5:41 PM Kelby Aline., MHA, RVT, RDCS, RDMS

## 2019-07-30 NOTE — Progress Notes (Signed)
PROGRESS NOTE  Sherry Williams [id number] DOB: 1961/05/04 DOA: 07/29/2019 PCP: Eulas Post, MD  HPI/Recap of past 24 hours: HPI from Dr Clarisa Schools Keoni Nieuwenhuis is a 59 y.o. female with history h/o hypothyroidism, DVT/PE on Xarelto, has been seeing her PCP and concern for multiple neurological symptoms since past Wednesday--she was referred for MRI today which showed new embolic pattern CVA and directed to come to the ED.  Patient has been having symptoms of transient numbness, altered sensorium in her extremities as well as vision changes, headaches over the last week.  She reports an episode of pleuritic right-sided chest pain 2 days back lasting about 10 minutes, not associated with nausea or vomiting or palpitations. Patient states she was diagnosed with DVT/PE in 2017 (apparently had travel history at that time) for which she was prescribed Xarelto.  She had seen Dr. Marin Olp for hypercoagulable work-up at that time and taken off anticoagulation after a year.  Sometime around December 2020, patient had another episode of right lower extremity DVT which prompted resumption of Xarelto.  She states she had several superficial thrombi in upper and lower extremities between 2018-2019 but was told did not need treatment.  She has family history of unprovoked clots in her sister.  Patient admitted for further management.  Neurology consulted.     Today, patient's denies any new complaints, reports some headache was relieved with Tylenol and some sleep.  Patient denies any chest pain, shortness of breath, abdominal pain, nausea/vomiting, fever/chills.   Assessment/Plan: Principal Problem:   CVA (cerebral vascular accident) [facility]) Active Problems:   Hypothyroidism, acquired, autoimmune   Pulmonary embolus ([place])   DVT (deep venous thrombosis) ([facility])  Numerous acute/early subacute infarcts within bilateral parietal cortex, occipital lobes, frontal lobe, caudate nucleus Likely embolic  process MRI showed above, no evidence of hemorrhagic conversion MRA head/neck showed negative intracranial MRA Echo showed to 60-65%, no intracardiac source of embolism TEE scheduled for 07/31/2019 A1c 5.6, LDL 107 Start Lipitor Neurology on board Continue IV heparin Frequent neuro checks, telemetry PT/OT  Hypothyroidism TSH 2.560 Continue Armour  History of recurrent DVT/PE Negative hypercoagulable work-up in the past CTA chest done negative for PE as patient continues to complain of right-sided chest pain Continue IV Heparin May require lifelong anticoagulation Follow-up as an outpatient with heme-onc          Malnutrition Type:      Malnutrition Characteristics:      Nutrition Interventions:       Estimated body mass index is 23.66 kg/m as calculated from the following:   Height as of 07/23/19: 5\' 6"  (1.676 m).   Weight as of 07/23/19: 66.5 kg.     Code Status: Full  Family Communication: None at bedside  Disposition Plan: Likely home on 07/31/2019, after completion of work-up, consultants signed off   Consultants:  Neurology  Procedures:  None  Antimicrobials:  None  DVT prophylaxis: IV Heparin   Objective: Vitals:   07/30/19 0300 07/30/19 0500 07/30/19 0745 07/30/19 1125  BP: (!) 105/58 117/72 106/72 112/77  Pulse: 63 65 67 70  Resp:   17 15  Temp:  98 F (36.7 C) 98 F (36.7 C) 98.3 F (36.8 C)  TempSrc:  Oral Oral Oral  SpO2: 100% 98% 99% 99%    Intake/Output Summary (Last 24 hours) at 07/30/2019 1126 Last data filed at 07/30/2019 0500 Gross per 24 hour  Intake 697.56 ml  Output --  Net 697.56 ml   There were no  vitals filed for this visit.  Exam:  General: NAD   Cardiovascular: S1, S2 present  Respiratory: CTAB  Abdomen: Soft, nontender, nondistended, bowel sounds present  Musculoskeletal: No bilateral pedal edema noted  Skin: Normal  Psychiatry: Normal mood Neurology: Strength equal in all extremities,  no other focal neurologic deficits noted   Data Reviewed: CBC: Recent Labs  Lab 07/23/19 1524 07/29/19 1102 07/30/19 0239  WBC 4.7 5.5 5.5  NEUTROABS 2.9 3.6  --   HGB 12.6 12.8 11.7*  HCT 37.7 39.5 35.1*  MCV 87.5 89.6 87.5  PLT 178.0 179 [id number]   Basic Metabolic Panel: Recent Labs  Lab 07/23/19 1524 07/29/19 1102  NA 141 142  K 3.9 3.8  CL 103 105  CO2 31 27  GLUCOSE 104* 96  BUN 19 14  CREATININE 1.03 0.91  CALCIUM 9.7 9.6   GFR: Estimated Creatinine Clearance: 61.5 mL/min (by C-G formula based on SCr of 0.91 mg/dL). Liver Function Tests: Recent Labs  Lab 07/23/19 1524 07/29/19 1102  AST 21 26  ALT 17 20  ALKPHOS 68 64  BILITOT 0.4 0.5  PROT 6.9 6.7  ALBUMIN 4.4 4.1   No results for input(s): LIPASE, AMYLASE in the last 168 hours. No results for input(s): AMMONIA in the last 168 hours. Coagulation Profile: Recent Labs  Lab 07/29/19 1102  INR 1.2   Cardiac Enzymes: No results for input(s): CKTOTAL, CKMB, CKMBINDEX, TROPONINI in the last 168 hours. BNP (last 3 results) No results for input(s): PROBNP in the last 8760 hours. HbA1C: Recent Labs    07/30/19 0239  HGBA1C 5.6   CBG: No results for input(s): GLUCAP in the last 168 hours. Lipid Profile: Recent Labs    07/30/19 0239  CHOL 172  HDL 58  LDLCALC 107*  TRIG 37  CHOLHDL 3.0   Thyroid Function Tests: Recent Labs    07/29/19 2040  TSH 2.560   Anemia Panel: No results for input(s): VITAMINB12, FOLATE, FERRITIN, TIBC, IRON, RETICCTPCT in the last 72 hours. Urine analysis:    Component Value Date/Time   COLORURINE AMBER (A) 06/01/2018 2324   APPEARANCEUR CLEAR 06/01/2018 2324   LABSPEC 1.005 06/01/2018 2324   LABSPEC 1.005 08/14/2016 1525   PHURINE 8.0 06/01/2018 2324   GLUCOSEU NEGATIVE 06/01/2018 2324   HGBUR LARGE (A) 06/01/2018 2324   BILIRUBINUR NEGATIVE 06/01/2018 2324   KETONESUR NEGATIVE 06/01/2018 2324   PROTEINUR 30 (A) 06/01/2018 2324   UROBILINOGEN 0.2 08/14/2016  1525   NITRITE POSITIVE (A) 06/01/2018 2324   LEUKOCYTESUR NEGATIVE 06/01/2018 2324   Sepsis Labs: @LABRCNTIP (procalcitonin:4,lacticidven:4)  ) Recent Results (from the past 240 hour(s))  SARS CORONAVIRUS 2 (TAT 6-24 HRS) Nasopharyngeal Nasopharyngeal Swab     Status: None   Collection Time: 07/29/19  3:40 PM   Specimen: Nasopharyngeal Swab  Result Value Ref Range Status   SARS Coronavirus 2 NEGATIVE NEGATIVE Final    Comment: (NOTE) SARS-CoV-2 target nucleic acids are NOT DETECTED. The SARS-CoV-2 RNA is generally detectable in upper and lower respiratory specimens during the acute phase of infection. Negative results do not preclude SARS-CoV-2 infection, do not rule out co-infections with other pathogens, and should not be used as the sole basis for treatment or other patient management decisions. Negative results must be combined with clinical observations, patient history, and epidemiological information. The expected result is Negative. Fact Sheet for Patients: SugarRoll.be Fact Sheet for Healthcare Providers: https://www.woods-mathews.com/ This test is not yet approved or cleared by the [place] FDA and  has been  authorized for detection and/or diagnosis of SARS-CoV-2 by FDA under an Emergency Use Authorization (EUA). This EUA will remain  in effect (meaning this test can be used) for the duration of the COVID-19 declaration under Section 56 4(b)(1) of the Act, 21 U.S.C. section 360bbb-3(b)(1), unless the authorization is terminated or revoked sooner. Performed at [facility], [place] [address] [place], [place] [address]       Studies: CT Angio Chest PE W/Cm &/Or Wo Cm  Result Date: 07/29/2019 CLINICAL DATA:  Shortness of breath EXAM: CT ANGIOGRAPHY CHEST WITH CONTRAST TECHNIQUE: Multidetector CT imaging of the chest was performed using the standard protocol during bolus administration of intravenous contrast.  Multiplanar CT image reconstructions and MIPs were obtained to evaluate the vascular anatomy. CONTRAST:  46mL OMNIPAQUE IOHEXOL 350 MG/ML SOLN COMPARISON:  CT 11/14/2017 FINDINGS: Cardiovascular: Satisfactory opacification of the pulmonary arteries to the segmental level. No evidence of pulmonary embolism. Normal heart size. No pericardial effusion. Thoracic aorta is nonaneurysmal. Mediastinum/Nodes: No enlarged mediastinal, hilar, or axillary lymph nodes. Thyroid gland, trachea, and esophagus demonstrate no significant findings. Lungs/Pleura: The lungs are clear without focal airspace consolidation, pleural effusion, or pneumothorax. Minimal linear scarring in the anterior aspect of the right middle lobe, unchanged. Upper Abdomen: No acute abnormality. Musculoskeletal: No chest wall abnormality. No acute or significant osseous findings. Review of the MIP images confirms the above findings. IMPRESSION: 1. Negative for pulmonary embolism. 2. No active pulmonary disease. Electronically Signed   By: Davina Poke D.O.   On: 07/29/2019 15:22   MR ANGIO HEAD WO CONTRAST  Result Date: 07/29/2019 CLINICAL DATA:  60 year old female with numerous scattered small infarcts in both superior cerebral hemispheres on MRI earlier today. EXAM: MRA HEAD WITHOUT CONTRAST TECHNIQUE: Angiographic images of the Circle of Willis were obtained using MRA technique without intravenous contrast. COMPARISON:  Neck MRA and brain MRI today reported separately. FINDINGS: Codominant distal vertebral arteries are patent to the basilar without stenosis. Patent PICA origins. Patent basilar artery, AICA and SCA origins without stenosis. Bilateral PCA branches are normal. Antegrade flow in both ICA siphons. No siphon stenosis. There is a right posterior communicating artery which appears normal. The left is diminutive or absent. Patent carotid termini. Normal MCA and ACA origins. The right ACA 8 1 appears fenestrated (series 1017, image 12) but  otherwise normal. Anterior communicating artery and visible ACA branches are within normal limits. Left MCA M1 segment bifurcates early without stenosis. Visible left MCA branches are within normal limits. Right MCA M1 and bifurcation are patent without stenosis. Visible right MCA branches are within normal limits. IMPRESSION: 1.  Negative intracranial MRA. 2. Normal variants: fenestrated right ACA A1 and early bifurcation of the left MCA. Electronically Signed   By: Genevie Ann M.D.   On: 07/29/2019 20:31   MR ANGIO NECK W WO CONTRAST  Result Date: 07/29/2019 CLINICAL DATA:  59 year old female with numerous scattered small infarcts in both superior cerebral hemispheres on MRI earlier today. EXAM: MRA NECK WITHOUT AND WITH CONTRAST TECHNIQUE: Multiplanar and multiecho pulse sequences of the neck were obtained without and with intravenous contrast. Angiographic images of the neck were obtained using MRA technique without and with intravenous contrast. CONTRAST:  66mL GADAVIST GADOBUTROL 1 MMOL/ML IV SOLN COMPARISON:  Brain MRI earlier today. FINDINGS: Precontrast time-of-flight images demonstrate antegrade flow in both cervical carotid and vertebral arteries. The vertebral arteries are codominant with antegrade flow continuing to the upper cervical spine. Carotid bifurcations appear within normal limits. Post-contrast neck  MRA images reveal a 3 vessel arch configuration. There is some motion artifact at the proximal great vessels but no definite hemodynamically significant proximal great vessel stenosis. The right CCA, right carotid bifurcation and cervical right ICA appear normal for age. Visible right ICA siphon and terminus appear negative. The proximal left CCA is partially obscured. Beyond that the left CCA, left carotid bifurcation and cervical left ICA are within normal limits for age. Negative visible left ICA siphon and terminus. No proximal subclavian artery or vertebral artery origin stenosis. Fairly  codominant vertebral arteries are patent to the vertebrobasilar junction without stenosis. IMPRESSION: Negative Neck MRA other than suspected artifact at the proximal left CCA. Electronically Signed   By: Genevie Ann M.D.   On: 07/29/2019 20:28   ECHOCARDIOGRAM COMPLETE  Result Date: 07/30/2019    ECHOCARDIOGRAM REPORT   Patient Name:   RALONDA SMEDLEY Date of Exam: 07/30/2019 Medical Rec #:  [id number]          Height:       66.0 in Accession #:    [id number]         Weight:       146.6 lb Date of Birth:  [birth year]           BSA:          1.75 m Patient Age:    82 years           BP:           106/72 mmHg Patient Gender: F                  HR:           60 bpm. Exam Location:  Inpatient Procedure: 2D Echo, Color Doppler and Cardiac Doppler Indications:    Stroke i163.9  History:        Patient has no prior history of Echocardiogram examinations.  Sonographer:    Raquel Sarna Senior RDCS Referring Phys: [id number] [place]  1. Left ventricular ejection fraction, by estimation, is 60 to 65%. The left ventricle has normal function. The left ventricle has no regional wall motion abnormalities. Left ventricular diastolic parameters were normal.  2. Right ventricular systolic function is normal. The right ventricular size is normal. Tricuspid regurgitation signal is inadequate for assessing PA pressure.  3. The mitral valve is normal in structure and function. Trivial mitral valve regurgitation. No evidence of mitral stenosis.  4. The aortic valve is normal in structure and function. Aortic valve regurgitation is not visualized. No aortic stenosis is present.  5. The inferior vena cava is normal in size with greater than 50% respiratory variability, suggesting right atrial pressure of 3 mmHg. Comparison(s): No prior Echocardiogram. Conclusion(s)/Recomendation(s): No intracardiac source of embolism detected on this transthoracic study. A transesophageal echocardiogram is recommended to exclude cardiac source of  embolism if clinically indicated. FINDINGS  Left Ventricle: Left ventricular ejection fraction, by estimation, is 60 to 65%. The left ventricle has normal function. The left ventricle has no regional wall motion abnormalities. The left ventricular internal cavity size was normal in size. There is  no left ventricular hypertrophy. Left ventricular diastolic parameters were normal. Right Ventricle: The right ventricular size is normal. No increase in right ventricular wall thickness. Right ventricular systolic function is normal. Tricuspid regurgitation signal is inadequate for assessing PA pressure. Left Atrium: Left atrial size was normal in size. Right Atrium: Right atrial size was normal in size. Pericardium: There is no evidence of pericardial effusion.  Mitral Valve: The mitral valve is normal in structure and function. Trivial mitral valve regurgitation. No evidence of mitral valve stenosis. Tricuspid Valve: The tricuspid valve is normal in structure. Tricuspid valve regurgitation is trivial. No evidence of tricuspid stenosis. Aortic Valve: The aortic valve is normal in structure and function. Aortic valve regurgitation is not visualized. No aortic stenosis is present. Pulmonic Valve: The pulmonic valve was grossly normal. Pulmonic valve regurgitation is not visualized. No evidence of pulmonic stenosis. Aorta: The aortic root, ascending aorta and aortic arch are all structurally normal, with no evidence of dilitation or obstruction. Venous: The inferior vena cava is normal in size with greater than 50% respiratory variability, suggesting right atrial pressure of 3 mmHg. IAS/Shunts: No atrial level shunt detected by color flow Doppler.  LEFT VENTRICLE PLAX 2D LVIDd:         4.35 cm  Diastology LVIDs:         2.90 cm  LV e' lateral:   9.36 cm/s LV PW:         0.80 cm  LV E/e' lateral: 8.1 LV IVS:        0.70 cm  LV e' medial:    6.31 cm/s LVOT diam:     1.90 cm  LV E/e' medial:  12.0 LV SV:         67.76 ml LV SV  Index:   30.10 LVOT Area:     2.84 cm  RIGHT VENTRICLE RV S prime:     11.30 cm/s TAPSE (M-mode): 2.1 cm LEFT ATRIUM             Index       RIGHT ATRIUM           Index LA diam:        2.60 cm 1.48 cm/m  RA Area:     13.00 cm LA Vol (A2C):   36.4 ml 20.77 ml/m RA Volume:   27.80 ml  15.86 ml/m LA Vol (A4C):   20.1 ml 11.47 ml/m LA Biplane Vol: 27.5 ml 15.69 ml/m  AORTIC VALVE LVOT Vmax:   108.00 cm/s LVOT Vmean:  68.700 cm/s LVOT VTI:    0.239 m  AORTA Ao Root diam: 2.30 cm MITRAL VALVE MV Area (PHT): 2.87 cm    SHUNTS MV Decel Time: 264 msec    Systemic VTI:  0.24 m MV E velocity: 75.80 cm/s  Systemic Diam: 1.90 cm MV A velocity: 46.70 cm/s MV E/A ratio:  1.62 Buford Dresser MD Electronically signed by Buford Dresser MD Signature Date/Time: 07/30/2019/11:01:09 AM    Final     Scheduled Meds: .  stroke: mapping our early stages of recovery book   Does not apply Once  . thyroid  60 mg Oral Once per day on Mon Tue Wed Thu Fri  . [START ON 08/02/2019] thyroid  90 mg Oral Once per day on Sun Sat    Continuous Infusions: . sodium chloride 75 mL/hr at 07/30/19 1022  . heparin 900 Units/hr (07/30/19 0129)     LOS: 1 day     Alma Friendly, MD [facility]  If 7PM-7AM, please contact night-coverage [email or web address] 07/30/2019, 11:26 AM

## 2019-07-30 NOTE — Progress Notes (Signed)
[place] for Heparin (Xarelto on hold) Indication: stroke, hx DVT  Allergies  Allergen Reactions  . Bee Venom Anaphylaxis  . Adhesive [Tape] Other (See Comments)    Irritation and red    Patient Measurements:   Heparin Dosing Weight: 66.5 kg  Vital Signs: Temp: 98.2 F (36.8 C) (02/16 2240) Temp Source: Oral (02/16 2240) BP: 110/76 (02/17 0045) Pulse Rate: 65 (02/17 0045)  Labs: Recent Labs    07/29/19 1102 07/29/19 2040 07/29/19 2344  HGB 12.8  --   --   HCT 39.5  --   --   PLT 179  --   --   APTT 28  --  57*  LABPROT 14.9  --   --   INR 1.2  --   --   HEPARINUNFRC  --  0.21* 0.39  CREATININE 0.91  --   --     Estimated Creatinine Clearance: 61.5 mL/min (by C-G formula based on SCr of 0.91 mg/dL).   Medical History: Past Medical History:  Diagnosis Date  . Allergy   . DVT (deep venous thrombosis) ([place])    lower extremity   . Endometrial ca ([place]) 03/14/2017  . Family history of adverse reaction to anesthesia    children had N/V   . Hypothyroidism    Hashimoto  . Lichen sclerosus   . MRSA (methicillin resistant staph aureus) culture positive   . Pulmonary embolism ([place]) 01/13/2016   After car trip    Medications:  Medications Prior to Admission  Medication Sig Dispense Refill Last Dose  . ARMOUR THYROID 60 MG tablet TAKE 1 TABLET BY MOUTH EVERY DAY BEFORE BREAKFAST. TAKE 1 DAILY 5 DAYS PER WEEK (Patient taking differently: Take 60 mg by mouth See admin instructions. Taking 60mg  5 days a week) 90 tablet 2 07/28/2019 at Unknown time  . ARMOUR THYROID 90 MG tablet TAKE 1 TABLET BY MOUTH EVERY DAY TWO DAYS PER WEEK (Patient taking differently: Take 90 mg by mouth See admin instructions. Taking 90mg  2 days a week  ( could not provide schedule)) 90 tablet 1   . rivaroxaban (XARELTO) 20 MG TABS tablet Take 1 tablet (20 mg total) by mouth daily with supper. 90 tablet 1 07/28/2019 at 0900   Scheduled:  .  stroke: mapping our  early stages of recovery book   Does not apply Once  . thyroid  60 mg Oral Once per day on Mon Tue Wed Thu Fri  . [START ON 08/02/2019] thyroid  90 mg Oral Once per day on Sun Sat   Infusions:  . sodium chloride 75 mL/hr at 07/29/19 2037  . heparin 800 Units/hr (07/29/19 2039)    Assessment: 59 y/o female on Xarelto PTA for h/o DVTs sent to the ED for evaluation for stroke; MRI showed multiple acute/subacute strokes. Last dose of Xarelto was 2/15 at 0900. Patient missed her dose this morning.   Hemoglobin and platelets WNL. Baseline aPTT 28. Baseline heparin level ordered. No bleeding noted at this time.   2/17 AM update:  APTT just below goal at 57   Goal of Therapy:  APTT 66-85s Heparin level 0.3-0.5 units/ml Monitor platelets by anticoagulation protocol: Yes   Plan:  Inc heparin to 900 units/hr Re-check heparin level and aPTT at 0900 Check daily heparin level, aPTT, CBC Monitor for s/sx of bleeding  Narda Bonds, PharmD, BCPS Clinical Pharmacist Phone: [phone]

## 2019-07-30 NOTE — Progress Notes (Signed)
STROKE TEAM PROGRESS NOTE   INTERVAL HISTORY I have personally reviewed history of presenting illness with the patient and her husband, reviewed imaging films in PACS and electronic medical records.  She presented with positive visual disturbance in the left field of vision following an episode in which she got emotionally upset and thought she had an anxiety attack.  She did not take her pulse and cannot tell me if she had palpitations or not.  MRI scan of the brain interestingly shows multiple small bilateral embolic-looking infarcts.  She has no known history of PFO but history of cardiac murmur as a child.  Vitals:   07/30/19 0045 07/30/19 0300 07/30/19 0500 07/30/19 0745  BP: 110/76 (!) 105/58 117/72 106/72  Pulse: 65 63 65 67  Resp: 18   17  Temp:   98 F (36.7 C) 98 F (36.7 C)  TempSrc:   Oral Oral  SpO2: 98% 100% 98% 99%    CBC:  Recent Labs  Lab 07/23/19 1524 07/23/19 1524 07/29/19 1102 07/30/19 0239  WBC 4.7   < > 5.5 5.5  NEUTROABS 2.9  --  3.6  --   HGB 12.6   < > 12.8 11.7*  HCT 37.7   < > 39.5 35.1*  MCV 87.5   < > 89.6 87.5  PLT 178.0   < > 179 165   < > = values in this interval not displayed.    Basic Metabolic Panel:  Recent Labs  Lab 07/23/19 1524 07/29/19 1102  NA 141 142  K 3.9 3.8  CL 103 105  CO2 31 27  GLUCOSE 104* 96  BUN 19 14  CREATININE 1.03 0.91  CALCIUM 9.7 9.6   Lipid Panel:     Component Value Date/Time   CHOL 172 07/30/2019 0239   TRIG 37 07/30/2019 0239   HDL 58 07/30/2019 0239   CHOLHDL 3.0 07/30/2019 0239   VLDL 7 07/30/2019 0239   LDLCALC 107 (H) 07/30/2019 0239   HgbA1c:  Lab Results  Component Value Date   HGBA1C 5.6 07/30/2019   Urine Drug Screen: No results found for: LABOPIA, COCAINSCRNUR, LABBENZ, AMPHETMU, THCU, LABBARB  Alcohol Level No results found for: ETH  IMAGING past 48 hours CT Angio Chest PE W/Cm &/Or Wo Cm  Result Date: 07/29/2019 CLINICAL DATA:  Shortness of breath EXAM: CT ANGIOGRAPHY CHEST  WITH CONTRAST TECHNIQUE: Multidetector CT imaging of the chest was performed using the standard protocol during bolus administration of intravenous contrast. Multiplanar CT image reconstructions and MIPs were obtained to evaluate the vascular anatomy. CONTRAST:  59mL OMNIPAQUE IOHEXOL 350 MG/ML SOLN COMPARISON:  CT 11/14/2017 FINDINGS: Cardiovascular: Satisfactory opacification of the pulmonary arteries to the segmental level. No evidence of pulmonary embolism. Normal heart size. No pericardial effusion. Thoracic aorta is nonaneurysmal. Mediastinum/Nodes: No enlarged mediastinal, hilar, or axillary lymph nodes. Thyroid gland, trachea, and esophagus demonstrate no significant findings. Lungs/Pleura: The lungs are clear without focal airspace consolidation, pleural effusion, or pneumothorax. Minimal linear scarring in the anterior aspect of the right middle lobe, unchanged. Upper Abdomen: No acute abnormality. Musculoskeletal: No chest wall abnormality. No acute or significant osseous findings. Review of the MIP images confirms the above findings. IMPRESSION: 1. Negative for pulmonary embolism. 2. No active pulmonary disease. Electronically Signed   By: Davina Poke D.O.   On: 07/29/2019 15:22   MR ANGIO HEAD WO CONTRAST  Result Date: 07/29/2019 CLINICAL DATA:  59 year old female with numerous scattered small infarcts in both superior cerebral hemispheres on MRI  earlier today. EXAM: MRA HEAD WITHOUT CONTRAST TECHNIQUE: Angiographic images of the Circle of Willis were obtained using MRA technique without intravenous contrast. COMPARISON:  Neck MRA and brain MRI today reported separately. FINDINGS: Codominant distal vertebral arteries are patent to the basilar without stenosis. Patent PICA origins. Patent basilar artery, AICA and SCA origins without stenosis. Bilateral PCA branches are normal. Antegrade flow in both ICA siphons. No siphon stenosis. There is a right posterior communicating artery which appears  normal. The left is diminutive or absent. Patent carotid termini. Normal MCA and ACA origins. The right ACA 8 1 appears fenestrated (series 1017, image 12) but otherwise normal. Anterior communicating artery and visible ACA branches are within normal limits. Left MCA M1 segment bifurcates early without stenosis. Visible left MCA branches are within normal limits. Right MCA M1 and bifurcation are patent without stenosis. Visible right MCA branches are within normal limits. IMPRESSION: 1.  Negative intracranial MRA. 2. Normal variants: fenestrated right ACA A1 and early bifurcation of the left MCA. Electronically Signed   By: Genevie Ann M.D.   On: 07/29/2019 20:31   MR ANGIO NECK W WO CONTRAST  Result Date: 07/29/2019 CLINICAL DATA:  59 year old female with numerous scattered small infarcts in both superior cerebral hemispheres on MRI earlier today. EXAM: MRA NECK WITHOUT AND WITH CONTRAST TECHNIQUE: Multiplanar and multiecho pulse sequences of the neck were obtained without and with intravenous contrast. Angiographic images of the neck were obtained using MRA technique without and with intravenous contrast. CONTRAST:  71mL GADAVIST GADOBUTROL 1 MMOL/ML IV SOLN COMPARISON:  Brain MRI earlier today. FINDINGS: Precontrast time-of-flight images demonstrate antegrade flow in both cervical carotid and vertebral arteries. The vertebral arteries are codominant with antegrade flow continuing to the upper cervical spine. Carotid bifurcations appear within normal limits. Post-contrast neck MRA images reveal a 3 vessel arch configuration. There is some motion artifact at the proximal great vessels but no definite hemodynamically significant proximal great vessel stenosis. The right CCA, right carotid bifurcation and cervical right ICA appear normal for age. Visible right ICA siphon and terminus appear negative. The proximal left CCA is partially obscured. Beyond that the left CCA, left carotid bifurcation and cervical left ICA  are within normal limits for age. Negative visible left ICA siphon and terminus. No proximal subclavian artery or vertebral artery origin stenosis. Fairly codominant vertebral arteries are patent to the vertebrobasilar junction without stenosis. IMPRESSION: Negative Neck MRA other than suspected artifact at the proximal left CCA. Electronically Signed   By: Genevie Ann M.D.   On: 07/29/2019 20:28   MR Brain Wo Contrast  Addendum Date: 07/29/2019   ADDENDUM REPORT: 07/29/2019 09:51 ADDENDUM: These results were called by telephone at the time of interpretation on 07/29/2019 at 9:51 am to provider Carolann Littler , who verbally acknowledged these results. Electronically Signed   By: Kellie Simmering DO   On: 07/29/2019 09:51   Result Date: 07/29/2019 CLINICAL DATA:  Other symptoms and signs involving the nervous system. Intermittent visual disturbance, recent headache; neuro deficit, subacute. Additional history provided by technologist: Intermittent visual changes, 2 episodes of right arm heaviness, dizziness and some bilateral hearing loss, history of uterine cancer 2018 EXAM: MRI HEAD WITHOUT CONTRAST TECHNIQUE: Multiplanar, multiecho pulse sequences of the brain and surrounding structures were obtained without intravenous contrast. COMPARISON:  No pertinent prior studies available for comparison. FINDINGS: Brain: There are numerous small foci of restricted diffusion consistent with acute/early subacute infarct. These infarcts are predominantly cortically based within the bilateral parietal lobes.  However, there are additional small acute/early subacute infarcts within the parietal white matter, cortical and subcortical posterolateral left frontal lobe, within the anterior right frontal lobe cortex, and within the bilateral occipital lobe cortex, as well as left caudate head. Corresponding T2/FLAIR hyperintensity at these sites. Additionally, there is a small amount of precontrast T1 hyperintensity within the right  parietal cortex which may reflect cortical laminar necrosis. No chronic intracranial blood products. There is no significant mass effect. No effacement of the ventricular system or midline shift. No extra-axial fluid collection. No evidence of intracranial mass. Background mild scattered T2/FLAIR hyperintensity within the cerebral white matter is nonspecific, but consistent with chronic small vessel ischemic disease. Cerebral volume is normal for age. Vascular: Flow voids maintained within the proximal large arterial vessels. Skull and upper cervical spine: No focal marrow lesion. Sinuses/Orbits: Visualized orbits demonstrate no acute abnormality. Minimal ethmoid and maxillary sinus mucosal thickening. No significant mastoid effusion. IMPRESSION: Numerous small acute/early subacute infarcts predominantly located within the bilateral parietal cortex. Additional small acute/early subacute infarcts within the parietal white matter, posterolateral left frontal lobe, anterior right frontal lobe and bilateral occipital lobes as well as left caudate nucleus. Given involvement of multiple vascular territories, correlate for an embolic process. No significant mass effect.  No evidence of hemorrhagic conversion. Background mild chronic small vessel ischemic disease. Electronically Signed: By: Kellie Simmering DO On: 07/29/2019 09:46    PHYSICAL EXAM Pleasant middle-aged Caucasian lady not in distress. . Afebrile. Head is nontraumatic. Neck is supple without bruit.    Cardiac exam no murmur or gallop. Lungs are clear to auscultation. Distal pulses are well felt. Neurological Exam ;  Awake  Alert oriented x 3.  Diminished recall 2/3.  Able to name 10 animals which can walk on 4 legs.  Normal speech and language.eye movements full without nystagmus.fundi were not visualized. Vision acuity and fields appear normal. Hearing is normal. Palatal movements are normal. Face symmetric. Tongue midline. Normal strength, tone, reflexes  and coordination. Normal sensation. Gait deferred.  ASSESSMENT/PLAN [name] is a 60 y.o. 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 OP MRI showed multiple B infarcts.   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 Normal variants.  MRA neck  Negative. Artifact L CCA  LE doppler  pending   Transcranial Doppler w/ bubble performed personally at the bedside was negative for right-to-left shunt  2D Echo EF 60-65%. No source of embolus   TEE to look for PFO. Arranged with [facility] for tomorrow.   (I have made patient NPO after midnight tonight). ROPE SCORE 5-6  LDL 107  HgbA1c 5.6  IV heparin for VTE prophylaxis  Xarelto (rivaroxaban) daily prior to admission, now on heparin IV.   Therapy recommendations:  No therapy needs  Disposition:  Return home  Hypercoagulable State  Hx PE 2017, genetic workup neg.   SVTs arms and legs fall 2020  RLE DVT 05/2019.   On on xarelto PTA - no on IV heparin  Bilateral LE dopplers pending  TCD bubble negative for PFO  TEE to look for PFO. Arranged  with [place] for tomorrow.   (I have made patient NPO after midnight tonight).    Followed by Dr. Marin Olp  Hyperlipidemia  Home meds:  No statin  On lipitor 40  LDL 107, goal < 70  Continue statin at discharge  Other Stroke Risk Factors  ETOH use,  advised to drink no more than 1 drink(s) a day  Family hx stroke (father)  Other Active Problems  Hypothyroid    Hx endometrial cancer  Hospital day # 1  I have personally  obtained history,examined this patient, reviewed notes, independently viewed imaging studies, participated in medical decision making and plan of care.ROS completed by me personally and pertinent positives fully documented  I have made any additions or clarifications directly to the above note.  She has history of recurrent DVT and pulmonary embolism and was on Xarelto and compliant with it but taking it with breakfast.  MRI shows by cerebral embolic infarcts source unclear but not paradoxical embolism as TCD bubble study was negative for PFO.  Recommend check TEE for other cardiac sources of embolism.  Consult Dr. Marin Olp patient's hematologist to consider alternatives to Xarelto for long-term anticoagulation.  Check lower extremity venous Dopplers to see if those clots have resolved with Xarelto.  Long discussion with the patient and husband at the bedside and with Dr. Horris Latino and answered questions.  Greater than 50% time during this 35-minute visit was spent in counseling and coordination of care about her embolic strokes and discussion about evaluation, treatment and answering questions  Antony Contras, MD Medical Director [place] Pager: [phone] 07/30/2019 4:47 PM    To contact Stroke Continuity provider, please refer to [email or web address] After hours, contact General Neurology

## 2019-07-30 NOTE — Progress Notes (Signed)
Echocardiogram 2D Echocardiogram has been performed.  Sherry Williams [name] 07/30/2019, 8:18 AM

## 2019-07-31 ENCOUNTER — Encounter (HOSPITAL_COMMUNITY)
Admission: EM | Disposition: A | Discharge status: Home / Self Care | Payer: Self-pay | Source: Ambulatory Visit | Attending: Internal Medicine

## 2019-07-31 ENCOUNTER — Encounter (HOSPITAL_COMMUNITY): Payer: Self-pay | Admitting: Internal Medicine

## 2019-07-31 ENCOUNTER — Inpatient Hospital Stay (HOSPITAL_COMMUNITY): Payer: 59

## 2019-07-31 DIAGNOSIS — Z90710 Acquired absence of both cervix and uterus: Secondary | ICD-10-CM

## 2019-07-31 DIAGNOSIS — Z801 Family history of malignant neoplasm of trachea, bronchus and lung: Secondary | ICD-10-CM

## 2019-07-31 DIAGNOSIS — M79662 Pain in left lower leg: Secondary | ICD-10-CM

## 2019-07-31 DIAGNOSIS — M79661 Pain in right lower leg: Secondary | ICD-10-CM

## 2019-07-31 DIAGNOSIS — Z86711 Personal history of pulmonary embolism: Secondary | ICD-10-CM

## 2019-07-31 DIAGNOSIS — Z90722 Acquired absence of ovaries, bilateral: Secondary | ICD-10-CM

## 2019-07-31 DIAGNOSIS — Z800 Family history of malignant neoplasm of digestive organs: Secondary | ICD-10-CM

## 2019-07-31 DIAGNOSIS — E785 Hyperlipidemia, unspecified: Secondary | ICD-10-CM

## 2019-07-31 DIAGNOSIS — Z8542 Personal history of malignant neoplasm of other parts of uterus: Secondary | ICD-10-CM

## 2019-07-31 DIAGNOSIS — I6340 Cerebral infarction due to embolism of unspecified cerebral artery: Principal | ICD-10-CM

## 2019-07-31 DIAGNOSIS — E039 Hypothyroidism, unspecified: Secondary | ICD-10-CM

## 2019-07-31 DIAGNOSIS — I639 Cerebral infarction, unspecified: Secondary | ICD-10-CM

## 2019-07-31 DIAGNOSIS — Z9079 Acquired absence of other genital organ(s): Secondary | ICD-10-CM

## 2019-07-31 DIAGNOSIS — Z832 Family history of diseases of the blood and blood-forming organs and certain disorders involving the immune mechanism: Secondary | ICD-10-CM

## 2019-07-31 DIAGNOSIS — Z86718 Personal history of other venous thrombosis and embolism: Secondary | ICD-10-CM

## 2019-07-31 HISTORY — PX: BUBBLE STUDY: SHX6837

## 2019-07-31 HISTORY — PX: TEE WITHOUT CARDIOVERSION: SHX5443

## 2019-07-31 LAB — CBC WITH DIFFERENTIAL/PLATELET
Abs Immature Granulocytes: 0.02 10*3/uL (ref 0.00–0.07)
Basophils Absolute: 0 10*3/uL (ref 0.0–0.1)
Basophils Relative: 0 %
Eosinophils Absolute: 0.1 10*3/uL (ref 0.0–0.5)
Eosinophils Relative: 3 %
HCT: 35.6 % — ABNORMAL LOW (ref 36.0–46.0)
Hemoglobin: 11.8 g/dL — ABNORMAL LOW (ref 12.0–15.0)
Immature Granulocytes: 0 %
Lymphocytes Relative: 42 %
Lymphs Abs: 2 10*3/uL (ref 0.7–4.0)
MCH: 29.3 pg (ref 26.0–34.0)
MCHC: 33.1 g/dL (ref 30.0–36.0)
MCV: 88.3 fL (ref 80.0–100.0)
Monocytes Absolute: 0.5 10*3/uL (ref 0.1–1.0)
Monocytes Relative: 10 %
Neutro Abs: 2.1 10*3/uL (ref 1.7–7.7)
Neutrophils Relative %: 45 %
Platelets: 196 10*3/uL (ref 150–400)
RBC: 4.03 MIL/uL (ref 3.87–5.11)
RDW: 12.5 % (ref 11.5–15.5)
WBC: 4.7 10*3/uL (ref 4.0–10.5)
nRBC: 0 % (ref 0.0–0.2)

## 2019-07-31 LAB — BASIC METABOLIC PANEL
Anion gap: 11 (ref 5–15)
BUN: 12 mg/dL (ref 6–20)
CO2: 23 mmol/L (ref 22–32)
Calcium: 8.9 mg/dL (ref 8.9–10.3)
Chloride: 107 mmol/L (ref 98–111)
Creatinine, Ser: 0.76 mg/dL (ref 0.44–1.00)
GFR calc Af Amer: >60 mL/min (ref >60)
GFR calc non Af Amer: >60 mL/min (ref >60)
Glucose, Bld: 92 mg/dL (ref 70–99)
Potassium: 3.8 mmol/L (ref 3.5–5.1)
Sodium: 141 mmol/L (ref 135–145)

## 2019-07-31 LAB — HEPARIN LEVEL (UNFRACTIONATED)
Heparin Unfractionated: 0.69 IU/mL (ref 0.30–0.70)
Heparin Unfractionated: 0.44 IU/mL (ref 0.30–0.70)

## 2019-07-31 LAB — APTT: aPTT: 99 seconds — ABNORMAL HIGH (ref 24–36)

## 2019-07-31 SURGERY — ECHOCARDIOGRAM, TRANSESOPHAGEAL
Anesthesia: Moderate Sedation

## 2019-07-31 MED ORDER — FENTANYL CITRATE (PF) 100 MCG/2ML IJ SOLN
INTRAMUSCULAR | Status: AC
  Filled 2019-07-31: qty 4

## 2019-07-31 MED ORDER — FENTANYL CITRATE (PF) 100 MCG/2ML IJ SOLN
INTRAMUSCULAR | Status: DC | PRN
  Administered 2019-07-31 (×2): 25 ug via INTRAVENOUS

## 2019-07-31 MED ORDER — SODIUM CHLORIDE 0.9 % IV SOLN: INTRAVENOUS | Status: DC

## 2019-07-31 MED ORDER — MIDAZOLAM HCL (PF) 10 MG/2ML IJ SOLN
INTRAMUSCULAR | Status: DC | PRN
  Administered 2019-07-31 (×2): 2 mg via INTRAVENOUS

## 2019-07-31 MED ORDER — BUTAMBEN-TETRACAINE-BENZOCAINE 2-2-14 % EX AERO
INHALATION_SPRAY | CUTANEOUS | Status: DC | PRN
  Administered 2019-07-31: 2 via TOPICAL

## 2019-07-31 MED ORDER — MIDAZOLAM HCL (PF) 5 MG/ML IJ SOLN
INTRAMUSCULAR | Status: AC
  Filled 2019-07-31: qty 2

## 2019-07-31 NOTE — Progress Notes (Signed)
[facility] for Heparin (Xarelto on hold) Indication: stroke, hx DVT  Allergies  Allergen Reactions  . Bee Venom Anaphylaxis  . Adhesive [Tape] Other (See Comments)    Irritation and red    Patient Measurements: Height: 5\' 6"  (167.6 cm) Weight: 144 lb (65.3 kg) IBW/kg (Calculated) : 59.3 Heparin Dosing Weight: 66.5 kg  Vital Signs: Temp: 98 F (36.7 C) (02/18 1605) Temp Source: Oral (02/18 1605) BP: 105/66 (02/18 1605) Pulse Rate: 58 (02/18 1605)  Labs: Recent Labs    07/29/19 1102 07/29/19 2040 07/29/19 2344 07/30/19 0239 07/30/19 0945 07/30/19 2103 07/31/19 0315 07/31/19 1548  HGB 12.8  --   --  11.7*  --   --  11.8*  --   HCT 39.5  --   --  35.1*  --   --  35.6*  --   PLT 179  --   --  165  --   --  196  --   APTT 28   < >   < >  --  127* 66* 99*  --   LABPROT 14.9  --   --   --   --   --   --   --   INR 1.2  --   --   --   --   --   --   --   HEPARINUNFRC  --    < >   < >  --  0.91*  --  0.69 0.44  CREATININE 0.91  --   --   --   --   --  0.76  --    < > = values in this interval not displayed.    Estimated Creatinine Clearance: 70 mL/min (by C-G formula based on SCr of 0.76 mg/dL).   Assessment: 59 y/o female on Xarelto PTA for h/o DVTs sent to the ED for evaluation for stroke. MRI showed multiple acute/subacute strokes. Last dose of Xarelto was 2/15 at 0900.   Heparin level after dose reduction to 650 units/hr is 0.44 - therapeutic for goal of 0.3 to 0.5. CBC is stable. No bleeding reported.   Goal of Therapy:  APTT 66-85s Heparin level 0.3-0.5 units/ml Monitor platelets by anticoagulation protocol: Yes   Plan:  Continue heparin at 650 units/hr Daily Heparin level and CBC. Monitor for any signs or symptoms of bleeding.   Follow-up decision on oral anticoagulation.   Sloan Leiter, PharmD, BCPS, BCCCP Clinical Pharmacist Please refer to [facility] for [place] numbers 07/31/2019 4:20 PM

## 2019-07-31 NOTE — Progress Notes (Signed)
[facility] for Heparin (Xarelto on Hold) Indication: stroke, hx DVT   Assessment: 59 yr old female on Xarelto PTA for h/o DVTs sent to the ED for evaluation for stroke; MRI showed multiple acute/subacute strokes. Last dose of Xarelto was 2/15 at 0900.  APTT 99sec, heparin level 0.69  Goal of Therapy:  aPTT 66-85s Heparin level 0.3-0.5 units/ml Monitor platelets by anticoagulation protocol: Yes   Plan:  Continue heparin infusion at 800 units/hr Monitor daily aPTT, heparin level, CBC Monitor for signs/symptoms of bleeding  Thanks for allowing pharmacy to be a part of this patient's care.  Excell Seltzer, PharmD Clinical Pharmacist 07/30/19, 21:48 PM

## 2019-07-31 NOTE — Progress Notes (Signed)
  Echocardiogram Echocardiogram Transesophageal has been performed.  Jannett Celestine 07/31/2019, 10:48 AM

## 2019-07-31 NOTE — Interval H&P Note (Signed)
History and Physical Interval Note:  07/31/2019 9:41 AM  Sherry Williams  has presented today for surgery, with the diagnosis of STROKE.  The various methods of treatment have been discussed with the patient and family. After consideration of risks, benefits and other options for treatment, the patient has consented to  Procedure(s): TRANSESOPHAGEAL ECHOCARDIOGRAM (TEE) (N/A) as a surgical intervention.  The patient's history has been reviewed, patient examined, no change in status, stable for surgery.  I have reviewed the patient's chart and labs.  Questions were answered to the patient's satisfaction.     Kirk Ruths

## 2019-07-31 NOTE — Progress Notes (Signed)
Friend of the family and given permission to look at the chart to find the results of the TEE and discuss with her

## 2019-07-31 NOTE — Progress Notes (Signed)
STROKE TEAM PROGRESS NOTE   INTERVAL HISTORY She had TEE today which was unremarkable except for atrial septal aneurysm. No PFO or clot. 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  Vitals:   07/31/19 1035 07/31/19 1043 07/31/19 1053 07/31/19 1103  BP: 127/72 (!) 97/41 (!) 94/48 (!) 95/52  Pulse: (!) 58 (!) 56 (!) 53 (!) 50  Resp: 14 12 12 12   Temp:  98.4 F (36.9 C)    TempSrc:  Oral    SpO2: 100% 98% 97% 98%  Weight:      Height:        CBC:  Recent Labs  Lab 07/29/19 1102 07/29/19 1102 07/30/19 0239 07/31/19 0315  WBC 5.5   < > 5.5 4.7  NEUTROABS 3.6  --   --  2.1  HGB 12.8   < > 11.7* 11.8*  HCT 39.5   < > 35.1* 35.6*  MCV 89.6   < > 87.5 88.3  PLT 179   < > 165 196   < > = values in this interval not displayed.    Basic Metabolic Panel:  Recent Labs  Lab 07/29/19 1102 07/31/19 0315  NA 142 141  K 3.8 3.8  CL 105 107  CO2 27 23  GLUCOSE 96 92  BUN 14 12  CREATININE 0.91 0.76  CALCIUM 9.6 8.9   Lipid Panel:     Component Value Date/Time   CHOL 172 07/30/2019 0239   TRIG 37 07/30/2019 0239   HDL 58 07/30/2019 0239   CHOLHDL 3.0 07/30/2019 0239   VLDL 7 07/30/2019 0239   LDLCALC 107 (H) 07/30/2019 0239   HgbA1c:  Lab Results  Component Value Date   HGBA1C 5.6 07/30/2019   Urine Drug Screen: No results found for: LABOPIA, COCAINSCRNUR, LABBENZ, AMPHETMU, THCU, LABBARB  Alcohol Level No results found for: ETH  IMAGING past 48 hours MR ANGIO HEAD WO CONTRAST  Result Date: 07/29/2019 CLINICAL DATA:  59 year old female with numerous scattered small infarcts in both superior cerebral hemispheres on MRI earlier today. EXAM: MRA HEAD WITHOUT CONTRAST TECHNIQUE: Angiographic images of the Circle of Willis were obtained using MRA technique without intravenous contrast. COMPARISON:  Neck MRA and brain MRI today reported separately. FINDINGS:  Codominant distal vertebral arteries are patent to the basilar without stenosis. Patent PICA origins. Patent basilar artery, AICA and SCA origins without stenosis. Bilateral PCA branches are normal. Antegrade flow in both ICA siphons. No siphon stenosis. There is a right posterior communicating artery which appears normal. The left is diminutive or absent. Patent carotid termini. Normal MCA and ACA origins. The right ACA 8 1 appears fenestrated (series 1017, image 12) but otherwise normal. Anterior communicating artery and visible ACA branches are within normal limits. Left MCA M1 segment bifurcates early without stenosis. Visible left MCA branches are within normal limits. Right MCA M1 and bifurcation are patent without stenosis. Visible right MCA branches are within normal limits. IMPRESSION: 1.  Negative intracranial MRA. 2. Normal variants: fenestrated right ACA A1 and early bifurcation of the left MCA. Electronically Signed   By: Genevie Ann M.D.   On: 07/29/2019 20:31   MR ANGIO NECK W WO CONTRAST  Result Date: 07/29/2019 CLINICAL DATA:  59 year old female with numerous scattered small infarcts in both superior cerebral hemispheres on MRI earlier today. EXAM: MRA NECK WITHOUT AND WITH CONTRAST TECHNIQUE: Multiplanar and multiecho pulse sequences of the  neck were obtained without and with intravenous contrast. Angiographic images of the neck were obtained using MRA technique without and with intravenous contrast. CONTRAST:  91mL GADAVIST GADOBUTROL 1 MMOL/ML IV SOLN COMPARISON:  Brain MRI earlier today. FINDINGS: Precontrast time-of-flight images demonstrate antegrade flow in both cervical carotid and vertebral arteries. The vertebral arteries are codominant with antegrade flow continuing to the upper cervical spine. Carotid bifurcations appear within normal limits. Post-contrast neck MRA images reveal a 3 vessel arch configuration. There is some motion artifact at the proximal great vessels but no definite  hemodynamically significant proximal great vessel stenosis. The right CCA, right carotid bifurcation and cervical right ICA appear normal for age. Visible right ICA siphon and terminus appear negative. The proximal left CCA is partially obscured. Beyond that the left CCA, left carotid bifurcation and cervical left ICA are within normal limits for age. Negative visible left ICA siphon and terminus. No proximal subclavian artery or vertebral artery origin stenosis. Fairly codominant vertebral arteries are patent to the vertebrobasilar junction without stenosis. IMPRESSION: Negative Neck MRA other than suspected artifact at the proximal left CCA. Electronically Signed   By: Genevie Ann M.D.   On: 07/29/2019 20:28   VAS [place] TRANSCRANIAL DOPPLER W BUBBLES  Result Date: 07/31/2019  Transcranial Doppler with Bubble Indications: Stroke. Comparison Study: No prior study Performing Technologist: Maudry Mayhew MHA, RDMS, RVT, RDCS  Examination Guidelines: A complete evaluation includes B-mode imaging, spectral Doppler, color Doppler, and power Doppler as needed of all accessible portions of each vessel. Bilateral testing is considered an integral part of a complete examination. Limited examinations for reoccurring indications may be performed as noted.  Summary:  A vascular evaluation was performed. The left middle cerebral artery was studied. An IV was inserted into the patient's left forearm. Verbal informed consent was obtained.  No evidence of HITS (high intensity transient signals) at rest or with Valsalva maneuver. Therefore, there is no evidence of clinically significant PFO (patent foramen ovale). Negative TCD Bubble study *See table(s) above for TCD measurements and observations.  Diagnosing physician: Antony Contras MD Electronically signed by Antony Contras MD on 07/31/2019 at 1:07:38 PM.    Final    ECHOCARDIOGRAM COMPLETE  Result Date: 07/30/2019    ECHOCARDIOGRAM REPORT   Patient Name:   ZENYA SCHAEDLER  Date of Exam: 07/30/2019 Medical Rec #:  [id number]          Height:       66.0 in Accession #:    [id number]         Weight:       146.6 lb Date of Birth:  1960-06-28           BSA:          1.75 m Patient Age:    59 years           BP:           106/72 mmHg Patient Gender: F                  HR:           60 bpm. Exam Location:  Inpatient Procedure: 2D Echo, Color Doppler and Cardiac Doppler Indications:    Stroke i163.9  History:        Patient has no prior history of Echocardiogram examinations.  Sonographer:    Raquel Sarna Senior RDCS Referring Phys: [id number] [place]  1. Left ventricular ejection fraction, by estimation, is 60 to 65%. The left ventricle has  normal function. The left ventricle has no regional wall motion abnormalities. Left ventricular diastolic parameters were normal.  2. Right ventricular systolic function is normal. The right ventricular size is normal. Tricuspid regurgitation signal is inadequate for assessing PA pressure.  3. The mitral valve is normal in structure and function. Trivial mitral valve regurgitation. No evidence of mitral stenosis.  4. The aortic valve is normal in structure and function. Aortic valve regurgitation is not visualized. No aortic stenosis is present.  5. The inferior vena cava is normal in size with greater than 50% respiratory variability, suggesting right atrial pressure of 3 mmHg. Comparison(s): No prior Echocardiogram. Conclusion(s)/Recomendation(s): No intracardiac source of embolism detected on this transthoracic study. A transesophageal echocardiogram is recommended to exclude cardiac source of embolism if clinically indicated. FINDINGS  Left Ventricle: Left ventricular ejection fraction, by estimation, is 60 to 65%. The left ventricle has normal function. The left ventricle has no regional wall motion abnormalities. The left ventricular internal cavity size was normal in size. There is  no left ventricular hypertrophy. Left ventricular  diastolic parameters were normal. Right Ventricle: The right ventricular size is normal. No increase in right ventricular wall thickness. Right ventricular systolic function is normal. Tricuspid regurgitation signal is inadequate for assessing PA pressure. Left Atrium: Left atrial size was normal in size. Right Atrium: Right atrial size was normal in size. Pericardium: There is no evidence of pericardial effusion. Mitral Valve: The mitral valve is normal in structure and function. Trivial mitral valve regurgitation. No evidence of mitral valve stenosis. Tricuspid Valve: The tricuspid valve is normal in structure. Tricuspid valve regurgitation is trivial. No evidence of tricuspid stenosis. Aortic Valve: The aortic valve is normal in structure and function. Aortic valve regurgitation is not visualized. No aortic stenosis is present. Pulmonic Valve: The pulmonic valve was grossly normal. Pulmonic valve regurgitation is not visualized. No evidence of pulmonic stenosis. Aorta: The aortic root, ascending aorta and aortic arch are all structurally normal, with no evidence of dilitation or obstruction. Venous: The inferior vena cava is normal in size with greater than 50% respiratory variability, suggesting right atrial pressure of 3 mmHg. IAS/Shunts: No atrial level shunt detected by color flow Doppler.  LEFT VENTRICLE PLAX 2D LVIDd:         4.35 cm  Diastology LVIDs:         2.90 cm  LV e' lateral:   9.36 cm/s LV PW:         0.80 cm  LV E/e' lateral: 8.1 LV IVS:        0.70 cm  LV e' medial:    6.31 cm/s LVOT diam:     1.90 cm  LV E/e' medial:  12.0 LV SV:         67.76 ml LV SV Index:   30.10 LVOT Area:     2.84 cm  RIGHT VENTRICLE RV S prime:     11.30 cm/s TAPSE (M-mode): 2.1 cm LEFT ATRIUM             Index       RIGHT ATRIUM           Index LA diam:        2.60 cm 1.48 cm/m  RA Area:     13.00 cm LA Vol (A2C):   36.4 ml 20.77 ml/m RA Volume:   27.80 ml  15.86 ml/m LA Vol (A4C):   20.1 ml 11.47 ml/m LA Biplane  Vol: 27.5 ml 15.69 ml/m  AORTIC VALVE  LVOT Vmax:   108.00 cm/s LVOT Vmean:  68.700 cm/s LVOT VTI:    0.239 m  AORTA Ao Root diam: 2.30 cm MITRAL VALVE MV Area (PHT): 2.87 cm    SHUNTS MV Decel Time: 264 msec    Systemic VTI:  0.24 m MV E velocity: 75.80 cm/s  Systemic Diam: 1.90 cm MV A velocity: 46.70 cm/s MV E/A ratio:  1.62 Buford Dresser MD Electronically signed by Buford Dresser MD Signature Date/Time: 07/30/2019/11:01:09 AM    Final    ECHO TEE  Result Date: 07/31/2019    TRANSESOPHOGEAL ECHO REPORT   Patient Name:   TAIRRA BULLIS Date of Exam: 07/31/2019 Medical Rec #:  [id number]          Height:       66.0 in Accession #:    [id number]         Weight:       146.6 lb Date of Birth:  [birth year]           BSA:          1.75 m Patient Age:    70 years           BP:           127/72 mmHg Patient Gender: F                  HR:           62 bpm. Exam Location:  Inpatient Procedure: Transesophageal Echo and Color Doppler Indications:    stroke 434.91  History:        Patient has prior history of Echocardiogram examinations, most                 recent 07/31/2019. PE.  Sonographer:    Jannett Celestine RDCS (AE) Referring Phys: Erin: The transesophogeal probe was passed without difficulty through the esophogus of the patient. Sedation performed by performing physician. Patients was under conscious sedation during this procedure. Anesthetic administered: 6mcg of Fentanyl, 4.0mg  of Versed. Image quality was good. The patient developed no complications during the procedure. IMPRESSIONS  1. Normal LV function; redundant atrial septum; negative saline microcavitation study.  2. Left ventricular ejection fraction, by estimation, is 55 to 60%. The left ventricle has normal function. The left ventricle has no regional wall motion abnormalities. Left ventricular diastolic function could not be evaluated.  3. Right ventricular systolic function is normal. The right ventricular  size is normal.  4. No left atrial/left atrial appendage thrombus was detected.  5. The mitral valve is normal in structure and function. Trivial mitral valve regurgitation.  6. The aortic valve is tricuspid. Aortic valve regurgitation is trivial. FINDINGS  Left Ventricle: Left ventricular ejection fraction, by estimation, is 55 to 60%. The left ventricle has normal function. The left ventricle has no regional wall motion abnormalities. The left ventricular internal cavity size was normal in size. There is  no left ventricular hypertrophy. Left ventricular diastolic function could not be evaluated. Right Ventricle: The right ventricular size is normal. Right vetricular wall thickness was not assessed. Right ventricular systolic function is normal. Left Atrium: Left atrial size was normal in size. No left atrial/left atrial appendage thrombus was detected. Right Atrium: Right atrial size was normal in size. Pericardium: There is no evidence of pericardial effusion. Mitral Valve: The mitral valve is normal in structure and function. Trivial mitral valve regurgitation. Tricuspid Valve: The tricuspid valve is normal in structure. Tricuspid valve regurgitation  is trivial. Aortic Valve: The aortic valve is tricuspid. Aortic valve regurgitation is trivial. Pulmonic Valve: The pulmonic valve was normal in structure. Pulmonic valve regurgitation is not visualized. Aorta: The aortic root is normal in size and structure. There is minimal (Grade I) plaque involving the descending aorta. IAS/Shunts: There is redundancy of the interatrial septum. No atrial level shunt detected by color flow Doppler. Additional Comments: Normal LV function; redundant atrial septum; negative saline microcavitation study. Kirk Ruths MD Electronically signed by Kirk Ruths MD Signature Date/Time: 07/31/2019/10:59:31 AM    Final    VAS [place] LOWER EXTREMITY VENOUS (DVT)  Result Date: 07/30/2019  Lower Venous DVTStudy Indications: Stroke, and  history of DVT.  Comparison Study: 06/03/2019- bilateral lower extremity venous duplex: positive                   for age indeterminate DVT right popliteal vein, right peroneal                   veins, right gastrocnemius veins, left soleal veins, and left                   posterior tibial veins. Performing Technologist: Maudry Mayhew MHA, RDMS, RVT, RDCS  Examination Guidelines: A complete evaluation includes B-mode imaging, spectral Doppler, color Doppler, and power Doppler as needed of all accessible portions of each vessel. Bilateral testing is considered an integral part of a complete examination. Limited examinations for reoccurring indications may be performed as noted. The reflux portion of the exam is performed with the patient in reverse Trendelenburg.  +---------+---------------+---------+-----------+----------+--------------+ RIGHT    CompressibilityPhasicitySpontaneityPropertiesThrombus Aging +---------+---------------+---------+-----------+----------+--------------+ CFV      Full           Yes      Yes                                 +---------+---------------+---------+-----------+----------+--------------+ SFJ      Full                                                        +---------+---------------+---------+-----------+----------+--------------+ FV Prox  Full                                                        +---------+---------------+---------+-----------+----------+--------------+ FV Mid   Full                                                        +---------+---------------+---------+-----------+----------+--------------+ FV DistalFull                                                        +---------+---------------+---------+-----------+----------+--------------+ PFV      Full                                                        +---------+---------------+---------+-----------+----------+--------------+  POP      Full            Yes      Yes                                 +---------+---------------+---------+-----------+----------+--------------+ PTV      Full                                                        +---------+---------------+---------+-----------+----------+--------------+ PERO     Partial                 No                   Chronic        +---------+---------------+---------+-----------+----------+--------------+ Gastroc  Full                                                        +---------+---------------+---------+-----------+----------+--------------+   +---------+---------------+---------+-----------+----------+--------------+ LEFT     CompressibilityPhasicitySpontaneityPropertiesThrombus Aging +---------+---------------+---------+-----------+----------+--------------+ CFV      Full           Yes      Yes                                 +---------+---------------+---------+-----------+----------+--------------+ SFJ      Full                                                        +---------+---------------+---------+-----------+----------+--------------+ FV Prox  Full                                                        +---------+---------------+---------+-----------+----------+--------------+ FV Mid   Full                                                        +---------+---------------+---------+-----------+----------+--------------+ FV DistalFull                                                        +---------+---------------+---------+-----------+----------+--------------+ PFV      Full                                                        +---------+---------------+---------+-----------+----------+--------------+  POP      Full           Yes      Yes                                 +---------+---------------+---------+-----------+----------+--------------+ PTV      Full                                                         +---------+---------------+---------+-----------+----------+--------------+ PERO     Full                                                        +---------+---------------+---------+-----------+----------+--------------+ Soleal   Full                                                        +---------+---------------+---------+-----------+----------+--------------+     Summary: RIGHT: - 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 - No cystic structure found in the popliteal fossa.  LEFT: - There is no evidence of deep vein thrombosis in the lower extremity.  - No cystic structure found in the popliteal fossa.  *See table(s) above for measurements and observations. Electronically signed by Harold Barban MD on 07/30/2019 at 6:35:55 PM.    Final     PHYSICAL EXAM Pleasant middle-aged Caucasian lady not in distress. . Afebrile. Head is nontraumatic. Neck is supple without bruit.    Cardiac exam no murmur or gallop. Lungs are clear to auscultation. Distal pulses are well felt. Neurological Exam ;  Awake  Alert oriented x 3.     Normal speech and language.eye movements full without nystagmus.fundi were not visualized. Vision acuity and fields appear normal. Hearing is normal. Palatal movements are normal. Face symmetric. Tongue midline. Normal strength, tone, reflexes and coordination. Normal sensation. Gait deferred.  ASSESSMENT/PLAN Ms. Lonzetta Jr is a 59 y.o. 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 OP MRI showed multiple B infarcts.   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 Normal variants.  MRA neck  Negative. Artifact L CCA  LE doppler  pending   Transcranial Doppler w/ bubble performed personally at the bedside was negative for right-to-left shunt  2D Echo EF 60-65%. No source of embolus   TEE  Normal. No PFO or clot.  LDL 107  HgbA1c 5.6  IV heparin for VTE prophylaxis  Xarelto (rivaroxaban) daily prior to admission, now on heparin IV.   Therapy recommendations:  No therapy needs  Disposition:  Return home  Hypercoagulable  State  Hx PE 2017, genetic workup neg.   SVTs arms and legs fall 2020  RLE DVT 05/2019.   On on xarelto PTA - no on IV heparin  Bilateral LE dopplers pending  TCD bubble negative for PFO  TEE to look for PFO. Arranged with [facility] for tomorrow.   (I have made patient NPO after midnight tonight).    Followed by Dr. Marin Olp  Hyperlipidemia  Home meds:  No statin  On lipitor 40  LDL 107, goal < 70  Continue statin at discharge  Other Stroke Risk Factors  ETOH use,  advised to drink no more than 1 drink(s) a day  Family hx stroke (father)  Other Active Problems  Hypothyroid    Hx endometrial cancer  Hospital day # 2   .  Recommend   Consult Dr. Marin Olp patient's hematologist to consider alternatives to Xarelto for long-term anticoagulation.   Long discussion with the patient and husband at the bedside and with Dr. Horris Latino and answered questions.  Greater than 50% time during this 25-minute visit was spent in counseling and coordination of care about her embolic strokes and discussion about evaluation, treatment and answering questions  Antony Contras, MD Medical Director [place] Pager: [phone] 07/31/2019 4:02 PM    To contact Stroke Continuity provider, please refer to [email or web address] After hours, contact General Neurology

## 2019-07-31 NOTE — Progress Notes (Signed)
    Transesophageal Echocardiogram Note  Terrie Pinch [id number] 01-02-1961  Procedure: Transesophageal Echocardiogram Indications: CVA  Procedure Details Consent: Obtained Time Out: Verified patient identification, verified procedure, site/side was marked, verified correct patient position, special equipment/implants available, Radiology Safety Procedures followed,  medications/allergies/relevent history reviewed, required imaging and test results available.  Performed  Medications:  During this procedure the patient is administered a total of Versed 4 mg and Fentanyl 50 mcg  to achieve and maintain moderate conscious sedation.  The patient's heart rate, blood pressure, and oxygen saturation are monitored continuously during the procedure. The period of conscious sedation is 30 minutes, of which I was present face-to-face 100% of this time.  Normal LV function; borderline atrial septal aneurysm; negative saline microcavitation study.   Complications: No apparent complications Patient did tolerate procedure well.  Kirk Ruths, MD

## 2019-07-31 NOTE — Progress Notes (Signed)
PROGRESS NOTE  Sherry Williams [id number] DOB: 02-22-1961 DOA: 07/29/2019 PCP: Eulas Post, MD  HPI/Recap of past 24 hours: HPI from Dr Clarisa Schools Sherry Williams is a 59 y.o. female with history h/o hypothyroidism, DVT/PE on Xarelto, has been seeing her PCP and concern for multiple neurological symptoms since past Wednesday--she was referred for MRI today which showed new embolic pattern CVA and directed to come to the ED.  Patient has been having symptoms of transient numbness, altered sensorium in her extremities as well as vision changes, headaches over the last week.  She reports an episode of pleuritic right-sided chest pain 2 days back lasting about 10 minutes, not associated with nausea or vomiting or palpitations. Patient states she was diagnosed with DVT/PE in 2017 (apparently had travel history at that time) for which she was prescribed Xarelto.  She had seen Dr. Marin Olp for hypercoagulable work-up at that time and taken off anticoagulation after a year.  Sometime around December 2020, patient had another episode of right lower extremity DVT which prompted resumption of Xarelto.  She states she had several superficial thrombi in upper and lower extremities between 2018-2019 but was told did not need treatment.  She has family history of unprovoked clots in her sister.  Patient admitted for further management.  Neurology consulted.     Today, saw pt prior to TEE, pt denies any new complaints. Denies any chest pain, SOB, abdominal pain, N/V, fever/chills. Had a lengthy discussion with patient about overall care, questions and concerns addressed.    Assessment/Plan: Principal Problem:   CVA (cerebral vascular accident) [facility]) Active Problems:   Hypothyroidism, acquired, autoimmune   Pulmonary embolus ([place])   DVT (deep venous thrombosis) ([facility])  Numerous acute/early subacute infarcts within bilateral parietal cortex, occipital lobes, frontal lobe, caudate nucleus Likely  embolic process MRI showed above, no evidence of hemorrhagic conversion MRA head/neck showed negative intracranial MRA Echo showed to 60-65%, no intracardiac source of embolism TEE done on 07/31/2019 showed normal LV function, borderline atrial septal aneurysm, negative saline microcavitation study A1c 5.6, LDL 107 Start Lipitor Neurology on board Continue IV heparin Frequent neuro checks, telemetry PT/OT- no follow up needed  Hypothyroidism TSH 2.560 Continue Armour  History of recurrent DVT/PE Negative hypercoagulable work-up in the past CTA chest done negative for PE as patient continues to complain of right-sided chest pain Continue IV Heparin Will require lifelong anticoagulation, and switch to alternative AC.  Patient is unwilling to take Coumadin due to diet restrictions Heme-onc Dr. Marin Olp consulted, will see patient likely on 08/01/2019, for further recommendation of alternate [facility]  History of endometrial cancer Status post hysterectomy and bilateral salpingo-oophorectomy on 04/27/2017         Malnutrition Type:      Malnutrition Characteristics:      Nutrition Interventions:       Estimated body mass index is 23.24 kg/m as calculated from the following:   Height as of this encounter: 5\' 6"  (1.676 m).   Weight as of this encounter: 65.3 kg.     Code Status: Full  Family Communication: None at bedside  Disposition Plan: Likely home on 08/01/2019, after completion of work-up, consultants signed off   Consultants:  Neurology  Heme-onc  Procedures:  TEE on 07/31/2019  Antimicrobials:  None  DVT prophylaxis: IV Heparin   Objective: Vitals:   07/31/19 1035 07/31/19 1043 07/31/19 1053 07/31/19 1103  BP: 127/72 (!) 97/41 (!) 94/48 (!) 95/52  Pulse: (!) 58 (!) 56 (!) 53 Marland Kitchen)  50  Resp: 14 12 12 12   Temp:  98.4 F (36.9 C)    TempSrc:  Oral    SpO2: 100% 98% 97% 98%  Weight:      Height:        Intake/Output Summary (Last 24 hours)  at 07/31/2019 1603 Last data filed at 07/31/2019 1500 Gross per 24 hour  Intake 576.84 ml  Output --  Net 576.84 ml   Filed Weights   07/31/19 0925  Weight: 65.3 kg    Exam:  General: NAD   Cardiovascular: S1, S2 present  Respiratory: CTAB  Abdomen: Soft, nontender, nondistended, bowel sounds present  Musculoskeletal: No bilateral pedal edema noted  Skin: Normal  Psychiatry: Normal mood  Neurology: Strength equal in all extremities, no other focal neurologic deficits noted   Data Reviewed: CBC: Recent Labs  Lab 07/29/19 1102 07/30/19 0239 07/31/19 0315  WBC 5.5 5.5 4.7  NEUTROABS 3.6  --  2.1  HGB 12.8 11.7* 11.8*  HCT 39.5 35.1* 35.6*  MCV 89.6 87.5 88.3  PLT 179 165 [id number]   Basic Metabolic Panel: Recent Labs  Lab 07/29/19 1102 07/31/19 0315  NA 142 141  K 3.8 3.8  CL 105 107  CO2 27 23  GLUCOSE 96 92  BUN 14 12  CREATININE 0.91 0.76  CALCIUM 9.6 8.9   GFR: Estimated Creatinine Clearance: 70 mL/min (by C-G formula based on SCr of 0.76 mg/dL). Liver Function Tests: Recent Labs  Lab 07/29/19 1102  AST 26  ALT 20  ALKPHOS 64  BILITOT 0.5  PROT 6.7  ALBUMIN 4.1   No results for input(s): LIPASE, AMYLASE in the last 168 hours. No results for input(s): AMMONIA in the last 168 hours. Coagulation Profile: Recent Labs  Lab 07/29/19 1102  INR 1.2   Cardiac Enzymes: No results for input(s): CKTOTAL, CKMB, CKMBINDEX, TROPONINI in the last 168 hours. BNP (last 3 results) No results for input(s): PROBNP in the last 8760 hours. HbA1C: Recent Labs    07/30/19 0239  HGBA1C 5.6   CBG: No results for input(s): GLUCAP in the last 168 hours. Lipid Profile: Recent Labs    07/30/19 0239  CHOL 172  HDL 58  LDLCALC 107*  TRIG 37  CHOLHDL 3.0   Thyroid Function Tests: Recent Labs    07/29/19 2040  TSH 2.560   Anemia Panel: No results for input(s): VITAMINB12, FOLATE, FERRITIN, TIBC, IRON, RETICCTPCT in the last 72 hours. Urine  analysis:    Component Value Date/Time   COLORURINE AMBER (A) 06/01/2018 2324   APPEARANCEUR CLEAR 06/01/2018 2324   LABSPEC 1.005 06/01/2018 2324   LABSPEC 1.005 08/14/2016 1525   PHURINE 8.0 06/01/2018 2324   GLUCOSEU NEGATIVE 06/01/2018 2324   HGBUR LARGE (A) 06/01/2018 2324   BILIRUBINUR NEGATIVE 06/01/2018 2324   KETONESUR NEGATIVE 06/01/2018 2324   PROTEINUR 30 (A) 06/01/2018 2324   UROBILINOGEN 0.2 08/14/2016 1525   NITRITE POSITIVE (A) 06/01/2018 2324   LEUKOCYTESUR NEGATIVE 06/01/2018 2324   Sepsis Labs: @LABRCNTIP (procalcitonin:4,lacticidven:4)  ) Recent Results (from the past 240 hour(s))  SARS CORONAVIRUS 2 (TAT 6-24 HRS) Nasopharyngeal Nasopharyngeal Swab     Status: None   Collection Time: 07/29/19  3:40 PM   Specimen: Nasopharyngeal Swab  Result Value Ref Range Status   SARS Coronavirus 2 NEGATIVE NEGATIVE Final    Comment: (NOTE) SARS-CoV-2 target nucleic acids are NOT DETECTED. The SARS-CoV-2 RNA is generally detectable in upper and lower respiratory specimens during the acute phase of infection. Negative results do  not preclude SARS-CoV-2 infection, do not rule out co-infections with other pathogens, and should not be used as the sole basis for treatment or other patient management decisions. Negative results must be combined with clinical observations, patient history, and epidemiological information. The expected result is Negative. Fact Sheet for Patients: SugarRoll.be Fact Sheet for Healthcare Providers: https://www.woods-mathews.com/ This test is not yet approved or cleared by the [place] FDA and  has been authorized for detection and/or diagnosis of SARS-CoV-2 by FDA under an Emergency Use Authorization (EUA). This EUA will remain  in effect (meaning this test can be used) for the duration of the COVID-19 declaration under Section 56 4(b)(1) of the Act, 21 U.S.C. section 360bbb-3(b)(1), unless the  authorization is terminated or revoked sooner. Performed at [facility], [place] [address], [place],  [address]       Studies: VAS [place] TRANSCRANIAL DOPPLER W BUBBLES  Result Date: 07/31/2019  Transcranial Doppler with Bubble Indications: Stroke. Comparison Study: No prior study Performing Technologist: Maudry Mayhew MHA, RDMS, RVT, RDCS  Examination Guidelines: A complete evaluation includes B-mode imaging, spectral Doppler, color Doppler, and power Doppler as needed of all accessible portions of each vessel. Bilateral testing is considered an integral part of a complete examination. Limited examinations for reoccurring indications may be performed as noted.  Summary:  A vascular evaluation was performed. The left middle cerebral artery was studied. An IV was inserted into the patient's left forearm. Verbal informed consent was obtained.  No evidence of HITS (high intensity transient signals) at rest or with Valsalva maneuver. Therefore, there is no evidence of clinically significant PFO (patent foramen ovale). Negative TCD Bubble study *See table(s) above for TCD measurements and observations.  Diagnosing physician: Antony Contras MD Electronically signed by Antony Contras MD on 07/31/2019 at 1:07:38 PM.    Final    ECHO TEE  Result Date: 07/31/2019    TRANSESOPHOGEAL ECHO REPORT   Patient Name:   Sherry Williams Date of Exam: 07/31/2019 Medical Rec #:  [id number]          Height:       66.0 in Accession #:    [id number]         Weight:       146.6 lb Date of Birth:  01-Jun-1961           BSA:          1.75 m Patient Age:    5 years           BP:           127/72 mmHg Patient Gender: F                  HR:           62 bpm. Exam Location:  Inpatient Procedure: Transesophageal Echo and Color Doppler Indications:    stroke 434.91  History:        Patient has prior history of Echocardiogram examinations, most                 recent 07/31/2019. PE.  Sonographer:    Jannett Celestine RDCS (AE)  Referring Phys: [place]: The transesophogeal probe was passed without difficulty through the esophogus of the patient. Sedation performed by performing physician. Patients was under conscious sedation during this procedure. Anesthetic administered: 33mcg of Fentanyl, 4.0mg  of Versed. Image quality was good. The patient developed no complications during the procedure. IMPRESSIONS  1. Normal LV function; redundant  atrial septum; negative saline microcavitation study.  2. Left ventricular ejection fraction, by estimation, is 55 to 60%. The left ventricle has normal function. The left ventricle has no regional wall motion abnormalities. Left ventricular diastolic function could not be evaluated.  3. Right ventricular systolic function is normal. The right ventricular size is normal.  4. No left atrial/left atrial appendage thrombus was detected.  5. The mitral valve is normal in structure and function. Trivial mitral valve regurgitation.  6. The aortic valve is tricuspid. Aortic valve regurgitation is trivial. FINDINGS  Left Ventricle: Left ventricular ejection fraction, by estimation, is 55 to 60%. The left ventricle has normal function. The left ventricle has no regional wall motion abnormalities. The left ventricular internal cavity size was normal in size. There is  no left ventricular hypertrophy. Left ventricular diastolic function could not be evaluated. Right Ventricle: The right ventricular size is normal. Right vetricular wall thickness was not assessed. Right ventricular systolic function is normal. Left Atrium: Left atrial size was normal in size. No left atrial/left atrial appendage thrombus was detected. Right Atrium: Right atrial size was normal in size. Pericardium: There is no evidence of pericardial effusion. Mitral Valve: The mitral valve is normal in structure and function. Trivial mitral valve regurgitation. Tricuspid Valve: The tricuspid valve is normal in structure.  Tricuspid valve regurgitation is trivial. Aortic Valve: The aortic valve is tricuspid. Aortic valve regurgitation is trivial. Pulmonic Valve: The pulmonic valve was normal in structure. Pulmonic valve regurgitation is not visualized. Aorta: The aortic root is normal in size and structure. There is minimal (Grade I) plaque involving the descending aorta. IAS/Shunts: There is redundancy of the interatrial septum. No atrial level shunt detected by color flow Doppler. Additional Comments: Normal LV function; redundant atrial septum; negative saline microcavitation study. Kirk Ruths MD Electronically signed by Kirk Ruths MD Signature Date/Time: 07/31/2019/10:59:31 AM    Final    VAS [place] LOWER EXTREMITY VENOUS (DVT)  Result Date: 07/30/2019  Lower Venous DVTStudy Indications: Stroke, and history of DVT.  Comparison Study: 06/03/2019- bilateral lower extremity venous duplex: positive                   for age indeterminate DVT right popliteal vein, right peroneal                   veins, right gastrocnemius veins, left soleal veins, and left                   posterior tibial veins. Performing Technologist: Maudry Mayhew MHA, RDMS, RVT, RDCS  Examination Guidelines: A complete evaluation includes B-mode imaging, spectral Doppler, color Doppler, and power Doppler as needed of all accessible portions of each vessel. Bilateral testing is considered an integral part of a complete examination. Limited examinations for reoccurring indications may be performed as noted. The reflux portion of the exam is performed with the patient in reverse Trendelenburg.  +---------+---------------+---------+-----------+----------+--------------+ RIGHT    CompressibilityPhasicitySpontaneityPropertiesThrombus Aging +---------+---------------+---------+-----------+----------+--------------+ CFV      Full           Yes      Yes                                  +---------+---------------+---------+-----------+----------+--------------+ SFJ      Full                                                        +---------+---------------+---------+-----------+----------+--------------+  FV Prox  Full                                                        +---------+---------------+---------+-----------+----------+--------------+ FV Mid   Full                                                        +---------+---------------+---------+-----------+----------+--------------+ FV DistalFull                                                        +---------+---------------+---------+-----------+----------+--------------+ PFV      Full                                                        +---------+---------------+---------+-----------+----------+--------------+ POP      Full           Yes      Yes                                 +---------+---------------+---------+-----------+----------+--------------+ PTV      Full                                                        +---------+---------------+---------+-----------+----------+--------------+ PERO     Partial                 No                   Chronic        +---------+---------------+---------+-----------+----------+--------------+ Gastroc  Full                                                        +---------+---------------+---------+-----------+----------+--------------+   +---------+---------------+---------+-----------+----------+--------------+ LEFT     CompressibilityPhasicitySpontaneityPropertiesThrombus Aging +---------+---------------+---------+-----------+----------+--------------+ CFV      Full           Yes      Yes                                 +---------+---------------+---------+-----------+----------+--------------+ SFJ      Full                                                         +---------+---------------+---------+-----------+----------+--------------+  FV Prox  Full                                                        +---------+---------------+---------+-----------+----------+--------------+ FV Mid   Full                                                        +---------+---------------+---------+-----------+----------+--------------+ FV DistalFull                                                        +---------+---------------+---------+-----------+----------+--------------+ PFV      Full                                                        +---------+---------------+---------+-----------+----------+--------------+ POP      Full           Yes      Yes                                 +---------+---------------+---------+-----------+----------+--------------+ PTV      Full                                                        +---------+---------------+---------+-----------+----------+--------------+ PERO     Full                                                        +---------+---------------+---------+-----------+----------+--------------+ Soleal   Full                                                        +---------+---------------+---------+-----------+----------+--------------+     Summary: RIGHT: - 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 - No cystic structure found in the popliteal fossa.  LEFT: - There is no evidence of deep vein thrombosis in the lower extremity.  - No cystic structure found in the popliteal fossa.  *See table(s) above for measurements and observations. Electronically signed by Harold Barban MD on 07/30/2019 at 6:35:55 PM.    Final     Scheduled Meds: .  stroke: mapping our early stages of recovery book   Does not apply Once  . atorvastatin  40 mg Oral q1800  . thyroid  60 mg Oral Once per  day on Mon Tue Wed Thu Fri  . [START ON 08/02/2019]  thyroid  90 mg Oral Once per day on Sun Sat    Continuous Infusions: . heparin 650 Units/hr (07/31/19 0840)     LOS: 2 days     Alma Friendly, MD [facility]  If 7PM-7AM, please contact night-coverage [email or web address] 07/31/2019, 4:03 PM

## 2019-07-31 NOTE — Progress Notes (Signed)
[place] for Heparin (Xarelto on hold) Indication: stroke, hx DVT  Allergies  Allergen Reactions  . Bee Venom Anaphylaxis  . Adhesive [Tape] Other (See Comments)    Irritation and red    Patient Measurements:   Heparin Dosing Weight: 66.5 kg  Vital Signs: Temp: 98 F (36.7 C) (02/18 0802) Temp Source: Oral (02/18 0802) BP: 128/83 (02/18 0802) Pulse Rate: 57 (02/18 0802)  Labs: Recent Labs    07/29/19 1102 07/29/19 1102 07/29/19 2040 07/29/19 2344 07/29/19 2344 07/30/19 0239 07/30/19 0945 07/30/19 2103 07/31/19 0315  HGB 12.8   < >  --   --   --  11.7*  --   --  11.8*  HCT 39.5  --   --   --   --  35.1*  --   --  35.6*  PLT 179  --   --   --   --  165  --   --  196  APTT 28  --   --  57*   < >  --  127* 66* 99*  LABPROT 14.9  --   --   --   --   --   --   --   --   INR 1.2  --   --   --   --   --   --   --   --   HEPARINUNFRC  --   --    < > 0.39  --   --  0.91*  --  0.69  CREATININE 0.91  --   --   --   --   --   --   --  0.76   < > = values in this interval not displayed.    Estimated Creatinine Clearance: 70 mL/min (by C-G formula based on SCr of 0.76 mg/dL).   Assessment: 59 y/o female on Xarelto PTA for h/o DVTs sent to the ED for evaluation for stroke; MRI showed multiple acute/subacute strokes. Last dose of Xarelto was 2/15 at 0900. \  Hep lvl this morning 0.69; aPTT 99 - correlating so can return to hep lvl monitoring only  Cbc stable  Goal of Therapy:  APTT 66-85s Heparin level 0.3-0.5 units/ml Monitor platelets by anticoagulation protocol: Yes   Plan:  Decrease heparin to 650 units/hr 1700 HL Daily hep lvl cbc F/u return to [place], PharmD, BCPS, BCCCP Clinical Pharmacist [phone]  Please check AMION for all [place] numbers  07/31/2019 8:34 AM

## 2019-07-31 NOTE — Consult Note (Addendum)
Sherry Williams  [phone] [phone]   MEDICAL ONCOLOGY - INITIAL CONSULTATION  Referral MD: Dr. 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 is a 59 year old female with a past medical history significant for hypothyroidism, history of DVT/PE status post treatment with Xarelto, and history of stage I, grade 1 endometrial cancer status post total robotic hysterectomy and bilateral salpingo-oophorectomy on 04/27/2017. The patient was last seen by Dr. Marin Williams for her history of PE and DVT on 02/15/2017. She had completed 6 months of Xarelto in March 2018. The patient had been seeing her primary care provider due to concerns for multiple neurological symptoms and had an MRI of the brain without contrast which showed numerous small acute/early subacute infarcts predominantly located within the bilateral parietal cortex. Additionally, there were small acute/early subacute infarcts within the parietal white matter, posterolateral left frontal lobe, anterior right frontal lobe, and bilateral occipital lobes as well as the left caudate nucleus. There was concern for an embolic process. Due to these results and the fact that she was having transient numbness, and altered sensorium in all her extremities, as well as vision changes and headaches, she was referred to the emergency room for evaluation. The patient has CT angiogram of the chest performed on 07/29/2019 which was negative for PE. Of note, the patient had a negative hypercoagulable work-up in December 2017. It appears that she had a another episode of right lower extremity DVT in around December 2020 which prompted her to resume Xarelto.  The patient was seen by neurology this admission who recommended MRA of the head and neck, transthoracic echo with possible TEE and loop monitor and for the patient to be placed on heparin. The MRA of the head performed on  07/29/2019 was negative. MRA of the neck performed on the same date was negative other than suspected artifact at the proximal left CCA. TEE performed earlier today showed a normal LV function, borderline atrial septal aneurysm, and negative saline microcavitation study.  When seen today, the patient reports that she is feeling much better.  She still has some mild difficulty remembering things but is no longer having any vision changes or headaches.  The patient reports that she was started back on Xarelto in about December 2020 and was taking it without any missed doses.  She denied any recent trips or immobilization.  Denies recent COVID-19 infection. She states that she developed vertigo the day after starting Xarelto.  She has also had some chest discomfort since starting Xarelto.  She has not had any bleeding.  No recent fever or chills.  Chest pain has resolved.  Denies shortness of breath, abdominal pain, nausea, vomiting, constipation, diarrhea.  She is still having pain in her calves which is what she felt when she was diagnosed with a recurrent DVT.  The patient is not taking any new medications.  She does take some homeopathic medications and also does juicing.  Denies tobacco use.  Drinks alcohol only occasionally.  The patient is married.  Family history significant for a sister with DVT.  Medical oncology was asked to the patient to make recommendations regarding her recurrent DVT and CVA while taking anticoagulation.   Past Medical History:  Diagnosis Date  . Allergy   . DVT (deep venous thrombosis) ([place])    lower extremity   . Endometrial ca ([place]) 03/14/2017  . Family history of adverse reaction to anesthesia  children had N/V   . Hypothyroidism    Hashimoto  . Lichen sclerosus   . MRSA (methicillin resistant staph aureus) culture positive   . Pulmonary embolism ([place]) 01/13/2016   After car trip  :  Past Surgical History:  Procedure Laterality Date  . CYSTOSCOPY WITH  RETROGRADE PYELOGRAM, URETEROSCOPY AND STENT PLACEMENT Left 05/18/2018   Procedure: CYSTOSCOPY WITH RETROGRADE PYELOGRAM, LEFT URETEROSCOPY AND LEFT URETERAL STENT PLACEMENT;  Surgeon: [name], MD;  Location: [facility];  Service: Urology;  Laterality: Left;  . CYSTOSCOPY WITH RETROGRADE PYELOGRAM, URETEROSCOPY AND STENT PLACEMENT Right 05/30/2018   Procedure: CYSTOSCOPY WITH RIGHT RETROGRADE PYELOGRAM, URETEROSCOPY LASER LITHOTRIPSY AND STENT PLACEMENT;  Surgeon: [name], MD;  [facility];  Service: Urology;  Laterality: Right;  . HOLMIUM LASER APPLICATION Left [id number]   Procedure: HOLMIUM LASER APPLICATION;  Surgeon: [name], MD;  Location: [facility];  Service: Urology;  Laterality: Left;  . HOLMIUM LASER APPLICATION Right [id number]   Procedure: HOLMIUM LASER APPLICATION;  Surgeon: [name], MD;  Location[facility] [address];  Service: Urology;  Laterality: Right;  . MOUTH SURGERY    . ROBOTIC ASSISTED TOTAL HYSTERECTOMY WITH BILATERAL SALPINGO OOPHERECTOMY Bilateral 04/17/2017   Procedure: XI ROBOTIC ASSISTED TOTAL HYSTERECTOMY WITH BILATERAL SALPINGO OOPHORECTOMY WITH SENTINAL LYMPH NODE BIOPSY AND POSSIBLE LYMPHADENECTOMY;  Surgeon: [name], MD;  Location: [facility];  Service: Gynecology;  Laterality: Bilateral;  :  Current Facility-Administered Medications  Medication Dose Route Frequency Provider Last Rate Last Admin  .  stroke: mapping our early stages of recovery book   Does not apply Once Guilford Shi, MD   Stopped at 07/29/19 2036  . acetaminophen (TYLENOL) tablet 650 mg  650 mg Oral Q4H PRN Guilford Shi, MD   650 mg at 07/30/19 1930   Or  . acetaminophen (TYLENOL) 160 MG/5ML solution 650 mg  650 mg Per Tube Q4H PRN Guilford Shi, MD       Or  . acetaminophen (TYLENOL) suppository 650 mg  650 mg Rectal Q4H PRN Guilford Shi, MD      . atorvastatin (LIPITOR) tablet 40 mg  40 mg Oral q1800  Alma Friendly, MD   40 mg at 07/30/19 1729  . heparin ADULT infusion 100 units/mL (25000 units/222mL sodium chloride 0.45%)  650 Units/hr Intravenous Continuous Alma Friendly, MD 6.5 mL/hr at 07/31/19 0840 650 Units/hr at 07/31/19 0840  . senna-docusate (Senokot-S) tablet 1 tablet  1 tablet Oral QHS PRN Guilford Shi, MD      . thyroid (ARMOUR) tablet 60 mg  60 mg Oral Once per day on Mon Tue Wed Thu Fri Guilford Shi, MD   60 mg at 07/31/19 0540  . [START ON 08/02/2019] thyroid (ARMOUR) tablet 90 mg  90 mg Oral Once per day on Sun Sat Guilford Shi, MD         Allergies  Allergen Reactions  . Bee Venom Anaphylaxis  . Adhesive [Tape] Other (See Comments)    Irritation and red  :  Family History  Problem Relation Age of Onset  . Stroke Father   . Alzheimer's disease Father   . Hyperlipidemia Father   . Deep vein thrombosis Sister   . Breast cancer Paternal Grandmother        In her 60s, but lived to be very elderly  . Arthritis Mother   . Lung cancer Maternal Grandmother   . Diabetes Paternal Grandfather   . Pancreatic cancer Paternal Grandfather   :  Social History   Socioeconomic History  . Marital status: Married    Spouse name: Not on file  . Number of children: Not on file  . Years of education: Not on file  . Highest education level: Not on file  Occupational History  . Not on file  Tobacco Use  . Smoking status: Never Smoker  . Smokeless tobacco: Never Used  Substance and Sexual Activity  . Alcohol use: Yes    Alcohol/week: 1.0 standard drinks    Types: 1 Glasses of wine per week    Comment: occassional  . Drug use: No  . Sexual activity: Yes  Other Topics Concern  . Not on file  Social History Narrative  . Not on file   Social Determinants of Health   Financial Resource Strain:   . Difficulty of Paying Living Expenses: Not on file  Food Insecurity:   . Worried About Charity fundraiser in the Last Year: Not on file  . Ran Out  of Food in the Last Year: Not on file  Transportation Needs:   . Lack of Transportation (Medical): Not on file  . Lack of Transportation (Non-Medical): Not on file  Physical Activity:   . Days of Exercise per Week: Not on file  . Minutes of Exercise per Session: Not on file  Stress:   . Feeling of Stress : Not on file  Social Connections:   . Frequency of Communication with Friends and Family: Not on file  . Frequency of Social Gatherings with Friends and Family: Not on file  . Attends Religious Services: Not on file  . Active Member of Clubs or Organizations: Not on file  . Attends Archivist Meetings: Not on file  . Marital Status: Not on file  Intimate Partner Violence:   . Fear of Current or Ex-Partner: Not on file  . Emotionally Abused: Not on file  . Physically Abused: Not on file  . Sexually Abused: Not on file  :  Review of Systems: A comprehensive 14 point review of systems was negative except as noted in the HPI.  Exam: Patient Vitals for the past 24 hrs:  BP Temp Temp src Pulse Resp SpO2 Height Weight  07/31/19 1103 (!) 95/52 -- -- (!) 50 12 98 % -- --  07/31/19 1053 (!) 94/48 -- -- (!) 53 12 97 % -- --  07/31/19 1043 (!) 97/41 98.4 F (36.9 C) Oral (!) 56 12 98 % -- --  07/31/19 1035 127/72 -- -- (!) 58 14 100 % -- --  07/31/19 1030 117/62 -- -- (!) 57 12 100 % -- --  07/31/19 1025 (!) 141/62 -- -- (!) 56 12 100 % -- --  07/31/19 1020 120/62 -- -- (!) 55 12 100 % -- --  07/31/19 1015 133/64 -- -- 66 13 100 % -- --  07/31/19 1010 (!) 125/59 -- -- 64 11 100 % -- --  07/31/19 0925 140/61 (!) 96.9 F (36.1 C) Temporal 65 14 100 % 5\' 6"  (1.676 m) 144 lb (65.3 kg)  07/31/19 0802 128/83 98 F (36.7 C) Oral (!) 57 18 100 % -- --  07/31/19 0412 104/61 97.8 F (36.6 C) Oral (!) 51 17 99 % -- --  07/30/19 2349 110/65 97.6 F (36.4 C) Oral 69 17 99 % -- --  07/30/19 1942 123/76 97.9 F (36.6 C) Oral 64 15 100 % -- --    General:  well-nourished in no acute  distress.  Eyes:  no scleral icterus.   ENT:  There were no oropharyngeal lesions.   Neck was without thyromegaly.   Lymphatics:  Negative cervical, supraclavicular or axillary adenopathy.   Respiratory: lungs were clear bilaterally without wheezing or crackles.   Cardiovascular:  Regular rate and rhythm, S1/S2, without murmur, rub or gallop.  There was no pedal edema.   GI:  abdomen was soft, flat, nontender, nondistended, without organomegaly.   Musculoskeletal:  no spinal tenderness of palpation of vertebral spine.   Skin exam was without echymosis, petichae.   Neuro exam was nonfocal. Patient was alert and oriented.  Attention was good.   Language was appropriate.  Mood was normal without depression.  Speech was not pressured.  Thought content was not tangential.     Lab Results  Component Value Date   WBC 4.7 07/31/2019   HGB 11.8 (L) 07/31/2019   HCT 35.6 (L) 07/31/2019   PLT 196 07/31/2019   GLUCOSE 92 07/31/2019   CHOL 172 07/30/2019   TRIG 37 07/30/2019   HDL 58 07/30/2019   LDLCALC 107 (H) 07/30/2019   ALT 20 07/29/2019   AST 26 07/29/2019   NA 141 07/31/2019   K 3.8 07/31/2019   CL 107 07/31/2019   CREATININE 0.76 07/31/2019   BUN 12 07/31/2019   CO2 23 07/31/2019    CT Angio Chest PE W/Cm &/Or Wo Cm  Result Date: 07/29/2019 CLINICAL DATA:  Shortness of breath EXAM: CT ANGIOGRAPHY CHEST WITH CONTRAST TECHNIQUE: Multidetector CT imaging of the chest was performed using the standard protocol during bolus administration of intravenous contrast. Multiplanar CT image reconstructions and MIPs were obtained to evaluate the vascular anatomy. CONTRAST:  11mL OMNIPAQUE IOHEXOL 350 MG/ML SOLN COMPARISON:  CT 11/14/2017 FINDINGS: Cardiovascular: Satisfactory opacification of the pulmonary arteries to the segmental level. No evidence of pulmonary embolism. Normal heart size. No pericardial effusion. Thoracic aorta is nonaneurysmal. Mediastinum/Nodes: No enlarged mediastinal, hilar,  or axillary lymph nodes. Thyroid gland, trachea, and esophagus demonstrate no significant findings. Lungs/Pleura: The lungs are clear without focal airspace consolidation, pleural effusion, or pneumothorax. Minimal linear scarring in the anterior aspect of the right middle lobe, unchanged. Upper Abdomen: No acute abnormality. Musculoskeletal: No chest wall abnormality. No acute or significant osseous findings. Review of the MIP images confirms the above findings. IMPRESSION: 1. Negative for pulmonary embolism. 2. No active pulmonary disease. Electronically Signed   By: Davina Poke D.O.   On: 07/29/2019 15:22   MR ANGIO HEAD WO CONTRAST  Result Date: 07/29/2019 CLINICAL DATA:  59 year old female with numerous scattered small infarcts in both superior cerebral hemispheres on MRI earlier today. EXAM: MRA HEAD WITHOUT CONTRAST TECHNIQUE: Angiographic images of the Circle of Willis were obtained using MRA technique without intravenous contrast. COMPARISON:  Neck MRA and brain MRI today reported separately. FINDINGS: Codominant distal vertebral arteries are patent to the basilar without stenosis. Patent PICA origins. Patent basilar artery, AICA and SCA origins without stenosis. Bilateral PCA branches are normal. Antegrade flow in both ICA siphons. No siphon stenosis. There is a right posterior communicating artery which appears normal. The left is diminutive or absent. Patent carotid termini. Normal MCA and ACA origins. The right ACA 8 1 appears fenestrated (series 1017, image 12) but otherwise normal. Anterior communicating artery and visible ACA branches are within normal limits. Left MCA M1 segment bifurcates early without stenosis. Visible left MCA branches are within normal limits. Right MCA M1 and bifurcation are patent without stenosis. Visible right MCA branches are within  normal limits. IMPRESSION: 1.  Negative intracranial MRA. 2. Normal variants: fenestrated right ACA A1 and early bifurcation of the  left MCA. Electronically Signed   By: Genevie Ann M.D.   On: 07/29/2019 20:31   MR ANGIO NECK W WO CONTRAST  Result Date: 07/29/2019 CLINICAL DATA:  59 year old female with numerous scattered small infarcts in both superior cerebral hemispheres on MRI earlier today. EXAM: MRA NECK WITHOUT AND WITH CONTRAST TECHNIQUE: Multiplanar and multiecho pulse sequences of the neck were obtained without and with intravenous contrast. Angiographic images of the neck were obtained using MRA technique without and with intravenous contrast. CONTRAST:  60mL GADAVIST GADOBUTROL 1 MMOL/ML IV SOLN COMPARISON:  Brain MRI earlier today. FINDINGS: Precontrast time-of-flight images demonstrate antegrade flow in both cervical carotid and vertebral arteries. The vertebral arteries are codominant with antegrade flow continuing to the upper cervical spine. Carotid bifurcations appear within normal limits. Post-contrast neck MRA images reveal a 3 vessel arch configuration. There is some motion artifact at the proximal great vessels but no definite hemodynamically significant proximal great vessel stenosis. The right CCA, right carotid bifurcation and cervical right ICA appear normal for age. Visible right ICA siphon and terminus appear negative. The proximal left CCA is partially obscured. Beyond that the left CCA, left carotid bifurcation and cervical left ICA are within normal limits for age. Negative visible left ICA siphon and terminus. No proximal subclavian artery or vertebral artery origin stenosis. Fairly codominant vertebral arteries are patent to the vertebrobasilar junction without stenosis. IMPRESSION: Negative Neck MRA other than suspected artifact at the proximal left CCA. Electronically Signed   By: Genevie Ann M.D.   On: 07/29/2019 20:28   MR Brain Wo Contrast  Addendum Date: 07/29/2019   ADDENDUM REPORT: 07/29/2019 09:51 ADDENDUM: These results were called by telephone at the time of interpretation on 07/29/2019 at 9:51 am to  provider Carolann Littler , who verbally acknowledged these results. Electronically Signed   By: Kellie Simmering DO   On: 07/29/2019 09:51   Result Date: 07/29/2019 CLINICAL DATA:  Other symptoms and signs involving the nervous system. Intermittent visual disturbance, recent headache; neuro deficit, subacute. Additional history provided by technologist: Intermittent visual changes, 2 episodes of right arm heaviness, dizziness and some bilateral hearing loss, history of uterine cancer 2018 EXAM: MRI HEAD WITHOUT CONTRAST TECHNIQUE: Multiplanar, multiecho pulse sequences of the brain and surrounding structures were obtained without intravenous contrast. COMPARISON:  No pertinent prior studies available for comparison. FINDINGS: Brain: There are numerous small foci of restricted diffusion consistent with acute/early subacute infarct. These infarcts are predominantly cortically based within the bilateral parietal lobes. However, there are additional small acute/early subacute infarcts within the parietal white matter, cortical and subcortical posterolateral left frontal lobe, within the anterior right frontal lobe cortex, and within the bilateral occipital lobe cortex, as well as left caudate head. Corresponding T2/FLAIR hyperintensity at these sites. Additionally, there is a small amount of precontrast T1 hyperintensity within the right parietal cortex which may reflect cortical laminar necrosis. No chronic intracranial blood products. There is no significant mass effect. No effacement of the ventricular system or midline shift. No extra-axial fluid collection. No evidence of intracranial mass. Background mild scattered T2/FLAIR hyperintensity within the cerebral white matter is nonspecific, but consistent with chronic small vessel ischemic disease. Cerebral volume is normal for age. Vascular: Flow voids maintained within the proximal large arterial vessels. Skull and upper cervical spine: No focal marrow lesion.  Sinuses/Orbits: Visualized orbits demonstrate no acute abnormality. Minimal ethmoid  and maxillary sinus mucosal thickening. No significant mastoid effusion. IMPRESSION: Numerous small acute/early subacute infarcts predominantly located within the bilateral parietal cortex. Additional small acute/early subacute infarcts within the parietal white matter, posterolateral left frontal lobe, anterior right frontal lobe and bilateral occipital lobes as well as left caudate nucleus. Given involvement of multiple vascular territories, correlate for an embolic process. No significant mass effect.  No evidence of hemorrhagic conversion. Background mild chronic small vessel ischemic disease. Electronically Signed: By: Kellie Simmering DO On: 07/29/2019 09:46   VAS [place] TRANSCRANIAL DOPPLER W BUBBLES  Result Date: 07/31/2019  Transcranial Doppler with Bubble Indications: Stroke. Comparison Study: No prior study Performing Technologist: Maudry Mayhew MHA, RDMS, RVT, RDCS  Examination Guidelines: A complete evaluation includes B-mode imaging, spectral Doppler, color Doppler, and power Doppler as needed of all accessible portions of each vessel. Bilateral testing is considered an integral part of a complete examination. Limited examinations for reoccurring indications may be performed as noted.  Summary:  A vascular evaluation was performed. The left middle cerebral artery was studied. An IV was inserted into the patient's left forearm. Verbal informed consent was obtained.  No evidence of HITS (high intensity transient signals) at rest or with Valsalva maneuver. Therefore, there is no evidence of clinically significant PFO (patent foramen ovale). Negative TCD Bubble study *See table(s) above for TCD measurements and observations.  Diagnosing physician: Antony Contras MD Electronically signed by Antony Contras MD on 07/31/2019 at 1:07:38 PM.    Final    ECHOCARDIOGRAM COMPLETE  Result Date: 07/30/2019    ECHOCARDIOGRAM REPORT    Patient Name:   Sherry Williams Date of Exam: 07/30/2019 Medical Rec #:  [id number]          Height:       66.0 in Accession #:    [id number]         Weight:       146.6 lb Date of Birth:  1961/01/28           BSA:          1.75 m Patient Age:    75 years           BP:           106/72 mmHg Patient Gender: F                  HR:           60 bpm. Exam Location:  Inpatient Procedure: 2D Echo, Color Doppler and Cardiac Doppler Indications:    Stroke i163.9  History:        Patient has no prior history of Echocardiogram examinations.  Sonographer:    Raquel Sarna Senior RDCS Referring Phys: [id number] [place]  1. Left ventricular ejection fraction, by estimation, is 60 to 65%. The left ventricle has normal function. The left ventricle has no regional wall motion abnormalities. Left ventricular diastolic parameters were normal.  2. Right ventricular systolic function is normal. The right ventricular size is normal. Tricuspid regurgitation signal is inadequate for assessing PA pressure.  3. The mitral valve is normal in structure and function. Trivial mitral valve regurgitation. No evidence of mitral stenosis.  4. The aortic valve is normal in structure and function. Aortic valve regurgitation is not visualized. No aortic stenosis is present.  5. The inferior vena cava is normal in size with greater than 50% respiratory variability, suggesting right atrial pressure of 3 mmHg. Comparison(s): No prior Echocardiogram. Conclusion(s)/Recomendation(s): No intracardiac source of embolism detected  on this transthoracic study. A transesophageal echocardiogram is recommended to exclude cardiac source of embolism if clinically indicated. FINDINGS  Left Ventricle: Left ventricular ejection fraction, by estimation, is 60 to 65%. The left ventricle has normal function. The left ventricle has no regional wall motion abnormalities. The left ventricular internal cavity size was normal in size. There is  no left ventricular  hypertrophy. Left ventricular diastolic parameters were normal. Right Ventricle: The right ventricular size is normal. No increase in right ventricular wall thickness. Right ventricular systolic function is normal. Tricuspid regurgitation signal is inadequate for assessing PA pressure. Left Atrium: Left atrial size was normal in size. Right Atrium: Right atrial size was normal in size. Pericardium: There is no evidence of pericardial effusion. Mitral Valve: The mitral valve is normal in structure and function. Trivial mitral valve regurgitation. No evidence of mitral valve stenosis. Tricuspid Valve: The tricuspid valve is normal in structure. Tricuspid valve regurgitation is trivial. No evidence of tricuspid stenosis. Aortic Valve: The aortic valve is normal in structure and function. Aortic valve regurgitation is not visualized. No aortic stenosis is present. Pulmonic Valve: The pulmonic valve was grossly normal. Pulmonic valve regurgitation is not visualized. No evidence of pulmonic stenosis. Aorta: The aortic root, ascending aorta and aortic arch are all structurally normal, with no evidence of dilitation or obstruction. Venous: The inferior vena cava is normal in size with greater than 50% respiratory variability, suggesting right atrial pressure of 3 mmHg. IAS/Shunts: No atrial level shunt detected by color flow Doppler.  LEFT VENTRICLE PLAX 2D LVIDd:         4.35 cm  Diastology LVIDs:         2.90 cm  LV e' lateral:   9.36 cm/s LV PW:         0.80 cm  LV E/e' lateral: 8.1 LV IVS:        0.70 cm  LV e' medial:    6.31 cm/s LVOT diam:     1.90 cm  LV E/e' medial:  12.0 LV SV:         67.76 ml LV SV Index:   30.10 LVOT Area:     2.84 cm  RIGHT VENTRICLE RV S prime:     11.30 cm/s TAPSE (M-mode): 2.1 cm LEFT ATRIUM             Index       RIGHT ATRIUM           Index LA diam:        2.60 cm 1.48 cm/m  RA Area:     13.00 cm LA Vol (A2C):   36.4 ml 20.77 ml/m RA Volume:   27.80 ml  15.86 ml/m LA Vol (A4C):    20.1 ml 11.47 ml/m LA Biplane Vol: 27.5 ml 15.69 ml/m  AORTIC VALVE LVOT Vmax:   108.00 cm/s LVOT Vmean:  68.700 cm/s LVOT VTI:    0.239 m  AORTA Ao Root diam: 2.30 cm MITRAL VALVE MV Area (PHT): 2.87 cm    SHUNTS MV Decel Time: 264 msec    Systemic VTI:  0.24 m MV E velocity: 75.80 cm/s  Systemic Diam: 1.90 cm MV A velocity: 46.70 cm/s MV E/A ratio:  1.62 Buford Dresser MD Electronically signed by Buford Dresser MD Signature Date/Time: 07/30/2019/11:01:09 AM    Final    ECHO TEE  Result Date: 07/31/2019    TRANSESOPHOGEAL ECHO REPORT   Patient Name:   Sherry Williams Date of Exam: 07/31/2019 Medical Rec #:  [id number]          Height:       66.0 in Accession #:    [id number]         Weight:       146.6 lb Date of Birth:  20-Mar-1961           BSA:          1.75 m Patient Age:    79 years           BP:           127/72 mmHg Patient Gender: F                  HR:           62 bpm. Exam Location:  Inpatient Procedure: Transesophageal Echo and Color Doppler Indications:    stroke 434.91  History:        Patient has prior history of Echocardiogram examinations, most                 recent 07/31/2019. PE.  Sonographer:    Jannett Celestine RDCS (AE) Referring Phys: [place]: The transesophogeal probe was passed without difficulty through the esophogus of the patient. Sedation performed by performing physician. Patients was under conscious sedation during this procedure. Anesthetic administered: 2mcg of Fentanyl, 4.0mg  of Versed. Image quality was good. The patient developed no complications during the procedure. IMPRESSIONS  1. Normal LV function; redundant atrial septum; negative saline microcavitation study.  2. Left ventricular ejection fraction, by estimation, is 55 to 60%. The left ventricle has normal function. The left ventricle has no regional wall motion abnormalities. Left ventricular diastolic function could not be evaluated.  3. Right ventricular systolic function is  normal. The right ventricular size is normal.  4. No left atrial/left atrial appendage thrombus was detected.  5. The mitral valve is normal in structure and function. Trivial mitral valve regurgitation.  6. The aortic valve is tricuspid. Aortic valve regurgitation is trivial. FINDINGS  Left Ventricle: Left ventricular ejection fraction, by estimation, is 55 to 60%. The left ventricle has normal function. The left ventricle has no regional wall motion abnormalities. The left ventricular internal cavity size was normal in size. There is  no left ventricular hypertrophy. Left ventricular diastolic function could not be evaluated. Right Ventricle: The right ventricular size is normal. Right vetricular wall thickness was not assessed. Right ventricular systolic function is normal. Left Atrium: Left atrial size was normal in size. No left atrial/left atrial appendage thrombus was detected. Right Atrium: Right atrial size was normal in size. Pericardium: There is no evidence of pericardial effusion. Mitral Valve: The mitral valve is normal in structure and function. Trivial mitral valve regurgitation. Tricuspid Valve: The tricuspid valve is normal in structure. Tricuspid valve regurgitation is trivial. Aortic Valve: The aortic valve is tricuspid. Aortic valve regurgitation is trivial. Pulmonic Valve: The pulmonic valve was normal in structure. Pulmonic valve regurgitation is not visualized. Aorta: The aortic root is normal in size and structure. There is minimal (Grade I) plaque involving the descending aorta. IAS/Shunts: There is redundancy of the interatrial septum. No atrial level shunt detected by color flow Doppler. Additional Comments: Normal LV function; redundant atrial septum; negative saline microcavitation study. Kirk Ruths MD Electronically signed by Kirk Ruths MD Signature Date/Time: 07/31/2019/10:59:31 AM    Final    VAS [place] LOWER EXTREMITY VENOUS (DVT)  Result Date: 07/30/2019  Lower Venous  DVTStudy Indications: Stroke, and history of  DVT.  Comparison Study: 06/03/2019- bilateral lower extremity venous duplex: positive                   for age indeterminate DVT right popliteal vein, right peroneal                   veins, right gastrocnemius veins, left soleal veins, and left                   posterior tibial veins. Performing Technologist: Maudry Mayhew MHA, RDMS, RVT, RDCS  Examination Guidelines: A complete evaluation includes B-mode imaging, spectral Doppler, color Doppler, and power Doppler as needed of all accessible portions of each vessel. Bilateral testing is considered an integral part of a complete examination. Limited examinations for reoccurring indications may be performed as noted. The reflux portion of the exam is performed with the patient in reverse Trendelenburg.  +---------+---------------+---------+-----------+----------+--------------+ RIGHT    CompressibilityPhasicitySpontaneityPropertiesThrombus Aging +---------+---------------+---------+-----------+----------+--------------+ CFV      Full           Yes      Yes                                 +---------+---------------+---------+-----------+----------+--------------+ SFJ      Full                                                        +---------+---------------+---------+-----------+----------+--------------+ FV Prox  Full                                                        +---------+---------------+---------+-----------+----------+--------------+ FV Mid   Full                                                        +---------+---------------+---------+-----------+----------+--------------+ FV DistalFull                                                        +---------+---------------+---------+-----------+----------+--------------+ PFV      Full                                                         +---------+---------------+---------+-----------+----------+--------------+ POP      Full           Yes      Yes                                 +---------+---------------+---------+-----------+----------+--------------+ PTV      Full                                                        +---------+---------------+---------+-----------+----------+--------------+  PERO     Partial                 No                   Chronic        +---------+---------------+---------+-----------+----------+--------------+ Gastroc  Full                                                        +---------+---------------+---------+-----------+----------+--------------+   +---------+---------------+---------+-----------+----------+--------------+ LEFT     CompressibilityPhasicitySpontaneityPropertiesThrombus Aging +---------+---------------+---------+-----------+----------+--------------+ CFV      Full           Yes      Yes                                 +---------+---------------+---------+-----------+----------+--------------+ SFJ      Full                                                        +---------+---------------+---------+-----------+----------+--------------+ FV Prox  Full                                                        +---------+---------------+---------+-----------+----------+--------------+ FV Mid   Full                                                        +---------+---------------+---------+-----------+----------+--------------+ FV DistalFull                                                        +---------+---------------+---------+-----------+----------+--------------+ PFV      Full                                                        +---------+---------------+---------+-----------+----------+--------------+ POP      Full           Yes      Yes                                  +---------+---------------+---------+-----------+----------+--------------+ PTV      Full                                                        +---------+---------------+---------+-----------+----------+--------------+  PERO     Full                                                        +---------+---------------+---------+-----------+----------+--------------+ Soleal   Full                                                        +---------+---------------+---------+-----------+----------+--------------+     Summary: RIGHT: - 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 - No cystic structure found in the popliteal fossa.  LEFT: - There is no evidence of deep vein thrombosis in the lower extremity.  - No cystic structure found in the popliteal fossa.  *See table(s) above for measurements and observations. Electronically signed by Harold Barban MD on 07/30/2019 at 6:35:55 PM.    Final      CT Angio Chest PE W/Cm &/Or Wo Cm  Result Date: 07/29/2019 CLINICAL DATA:  Shortness of breath EXAM: CT ANGIOGRAPHY CHEST WITH CONTRAST TECHNIQUE: Multidetector CT imaging of the chest was performed using the standard protocol during bolus administration of intravenous contrast. Multiplanar CT image reconstructions and MIPs were obtained to evaluate the vascular anatomy. CONTRAST:  41mL OMNIPAQUE IOHEXOL 350 MG/ML SOLN COMPARISON:  CT 11/14/2017 FINDINGS: Cardiovascular: Satisfactory opacification of the pulmonary arteries to the segmental level. No evidence of pulmonary embolism. Normal heart size. No pericardial effusion. Thoracic aorta is nonaneurysmal. Mediastinum/Nodes: No enlarged mediastinal, hilar, or axillary lymph nodes. Thyroid gland, trachea, and esophagus demonstrate no significant findings. Lungs/Pleura: The lungs are clear without focal airspace consolidation, pleural effusion, or pneumothorax. Minimal linear scarring in the anterior aspect of  the right middle lobe, unchanged. Upper Abdomen: No acute abnormality. Musculoskeletal: No chest wall abnormality. No acute or significant osseous findings. Review of the MIP images confirms the above findings. IMPRESSION: 1. Negative for pulmonary embolism. 2. No active pulmonary disease. Electronically Signed   By: Davina Poke D.O.   On: 07/29/2019 15:22   MR ANGIO HEAD WO CONTRAST  Result Date: 07/29/2019 CLINICAL DATA:  59 year old female with numerous scattered small infarcts in both superior cerebral hemispheres on MRI earlier today. EXAM: MRA HEAD WITHOUT CONTRAST TECHNIQUE: Angiographic images of the Circle of Willis were obtained using MRA technique without intravenous contrast. COMPARISON:  Neck MRA and brain MRI today reported separately. FINDINGS: Codominant distal vertebral arteries are patent to the basilar without stenosis. Patent PICA origins. Patent basilar artery, AICA and SCA origins without stenosis. Bilateral PCA branches are normal. Antegrade flow in both ICA siphons. No siphon stenosis. There is a right posterior communicating artery which appears normal. The left is diminutive or absent. Patent carotid termini. Normal MCA and ACA origins. The right ACA 8 1 appears fenestrated (series 1017, image 12) but otherwise normal. Anterior communicating artery and visible ACA branches are within normal limits. Left MCA M1 segment bifurcates early without stenosis. Visible left MCA branches are within normal limits. Right MCA M1 and bifurcation are patent without stenosis. Visible right MCA branches are within normal limits. IMPRESSION: 1.  Negative intracranial MRA. 2. Normal variants: fenestrated right ACA A1 and early bifurcation of the left MCA. Electronically Signed   By:  Genevie Ann M.D.   On: 07/29/2019 20:31   MR ANGIO NECK W WO CONTRAST  Result Date: 07/29/2019 CLINICAL DATA:  59 year old female with numerous scattered small infarcts in both superior cerebral hemispheres on MRI earlier  today. EXAM: MRA NECK WITHOUT AND WITH CONTRAST TECHNIQUE: Multiplanar and multiecho pulse sequences of the neck were obtained without and with intravenous contrast. Angiographic images of the neck were obtained using MRA technique without and with intravenous contrast. CONTRAST:  10mL GADAVIST GADOBUTROL 1 MMOL/ML IV SOLN COMPARISON:  Brain MRI earlier today. FINDINGS: Precontrast time-of-flight images demonstrate antegrade flow in both cervical carotid and vertebral arteries. The vertebral arteries are codominant with antegrade flow continuing to the upper cervical spine. Carotid bifurcations appear within normal limits. Post-contrast neck MRA images reveal a 3 vessel arch configuration. There is some motion artifact at the proximal great vessels but no definite hemodynamically significant proximal great vessel stenosis. The right CCA, right carotid bifurcation and cervical right ICA appear normal for age. Visible right ICA siphon and terminus appear negative. The proximal left CCA is partially obscured. Beyond that the left CCA, left carotid bifurcation and cervical left ICA are within normal limits for age. Negative visible left ICA siphon and terminus. No proximal subclavian artery or vertebral artery origin stenosis. Fairly codominant vertebral arteries are patent to the vertebrobasilar junction without stenosis. IMPRESSION: Negative Neck MRA other than suspected artifact at the proximal left CCA. Electronically Signed   By: Genevie Ann M.D.   On: 07/29/2019 20:28   MR Brain Wo Contrast  Addendum Date: 07/29/2019   ADDENDUM REPORT: 07/29/2019 09:51 ADDENDUM: These results were called by telephone at the time of interpretation on 07/29/2019 at 9:51 am to provider Carolann Littler , who verbally acknowledged these results. Electronically Signed   By: Kellie Simmering DO   On: 07/29/2019 09:51   Result Date: 07/29/2019 CLINICAL DATA:  Other symptoms and signs involving the nervous system. Intermittent visual  disturbance, recent headache; neuro deficit, subacute. Additional history provided by technologist: Intermittent visual changes, 2 episodes of right arm heaviness, dizziness and some bilateral hearing loss, history of uterine cancer 2018 EXAM: MRI HEAD WITHOUT CONTRAST TECHNIQUE: Multiplanar, multiecho pulse sequences of the brain and surrounding structures were obtained without intravenous contrast. COMPARISON:  No pertinent prior studies available for comparison. FINDINGS: Brain: There are numerous small foci of restricted diffusion consistent with acute/early subacute infarct. These infarcts are predominantly cortically based within the bilateral parietal lobes. However, there are additional small acute/early subacute infarcts within the parietal white matter, cortical and subcortical posterolateral left frontal lobe, within the anterior right frontal lobe cortex, and within the bilateral occipital lobe cortex, as well as left caudate head. Corresponding T2/FLAIR hyperintensity at these sites. Additionally, there is a small amount of precontrast T1 hyperintensity within the right parietal cortex which may reflect cortical laminar necrosis. No chronic intracranial blood products. There is no significant mass effect. No effacement of the ventricular system or midline shift. No extra-axial fluid collection. No evidence of intracranial mass. Background mild scattered T2/FLAIR hyperintensity within the cerebral white matter is nonspecific, but consistent with chronic small vessel ischemic disease. Cerebral volume is normal for age. Vascular: Flow voids maintained within the proximal large arterial vessels. Skull and upper cervical spine: No focal marrow lesion. Sinuses/Orbits: Visualized orbits demonstrate no acute abnormality. Minimal ethmoid and maxillary sinus mucosal thickening. No significant mastoid effusion. IMPRESSION: Numerous small acute/early subacute infarcts predominantly located within the bilateral  parietal cortex. Additional small acute/early subacute infarcts  within the parietal white matter, posterolateral left frontal lobe, anterior right frontal lobe and bilateral occipital lobes as well as left caudate nucleus. Given involvement of multiple vascular territories, correlate for an embolic process. No significant mass effect.  No evidence of hemorrhagic conversion. Background mild chronic small vessel ischemic disease. Electronically Signed: By: Kellie Simmering DO On: 07/29/2019 09:46   VAS [place] TRANSCRANIAL DOPPLER W BUBBLES  Result Date: 07/31/2019  Transcranial Doppler with Bubble Indications: Stroke. Comparison Study: No prior study Performing Technologist: Maudry Mayhew MHA, RDMS, RVT, RDCS  Examination Guidelines: A complete evaluation includes B-mode imaging, spectral Doppler, color Doppler, and power Doppler as needed of all accessible portions of each vessel. Bilateral testing is considered an integral part of a complete examination. Limited examinations for reoccurring indications may be performed as noted.  Summary:  A vascular evaluation was performed. The left middle cerebral artery was studied. An IV was inserted into the patient's left forearm. Verbal informed consent was obtained.  No evidence of HITS (high intensity transient signals) at rest or with Valsalva maneuver. Therefore, there is no evidence of clinically significant PFO (patent foramen ovale). Negative TCD Bubble study *See table(s) above for TCD measurements and observations.  Diagnosing physician: Antony Contras MD Electronically signed by Antony Contras MD on 07/31/2019 at 1:07:38 PM.    Final    ECHOCARDIOGRAM COMPLETE  Result Date: 07/30/2019    ECHOCARDIOGRAM REPORT   Patient Name:   Sherry Williams Date of Exam: 07/30/2019 Medical Rec #:  [id number]          Height:       66.0 in Accession #:    [id number]         Weight:       146.6 lb Date of Birth:  September 14, 1960           BSA:          1.75 m Patient Age:    63 years            BP:           106/72 mmHg Patient Gender: F                  HR:           60 bpm. Exam Location:  Inpatient Procedure: 2D Echo, Color Doppler and Cardiac Doppler Indications:    Stroke i163.9  History:        Patient has no prior history of Echocardiogram examinations.  Sonographer:    Raquel Sarna Senior RDCS Referring Phys: [id number] [place]  1. Left ventricular ejection fraction, by estimation, is 60 to 65%. The left ventricle has normal function. The left ventricle has no regional wall motion abnormalities. Left ventricular diastolic parameters were normal.  2. Right ventricular systolic function is normal. The right ventricular size is normal. Tricuspid regurgitation signal is inadequate for assessing PA pressure.  3. The mitral valve is normal in structure and function. Trivial mitral valve regurgitation. No evidence of mitral stenosis.  4. The aortic valve is normal in structure and function. Aortic valve regurgitation is not visualized. No aortic stenosis is present.  5. The inferior vena cava is normal in size with greater than 50% respiratory variability, suggesting right atrial pressure of 3 mmHg. Comparison(s): No prior Echocardiogram. Conclusion(s)/Recomendation(s): No intracardiac source of embolism detected on this transthoracic study. A transesophageal echocardiogram is recommended to exclude cardiac source of embolism if clinically indicated. FINDINGS  Left Ventricle: Left ventricular ejection fraction, by  estimation, is 60 to 65%. The left ventricle has normal function. The left ventricle has no regional wall motion abnormalities. The left ventricular internal cavity size was normal in size. There is  no left ventricular hypertrophy. Left ventricular diastolic parameters were normal. Right Ventricle: The right ventricular size is normal. No increase in right ventricular wall thickness. Right ventricular systolic function is normal. Tricuspid regurgitation signal is inadequate  for assessing PA pressure. Left Atrium: Left atrial size was normal in size. Right Atrium: Right atrial size was normal in size. Pericardium: There is no evidence of pericardial effusion. Mitral Valve: The mitral valve is normal in structure and function. Trivial mitral valve regurgitation. No evidence of mitral valve stenosis. Tricuspid Valve: The tricuspid valve is normal in structure. Tricuspid valve regurgitation is trivial. No evidence of tricuspid stenosis. Aortic Valve: The aortic valve is normal in structure and function. Aortic valve regurgitation is not visualized. No aortic stenosis is present. Pulmonic Valve: The pulmonic valve was grossly normal. Pulmonic valve regurgitation is not visualized. No evidence of pulmonic stenosis. Aorta: The aortic root, ascending aorta and aortic arch are all structurally normal, with no evidence of dilitation or obstruction. Venous: The inferior vena cava is normal in size with greater than 50% respiratory variability, suggesting right atrial pressure of 3 mmHg. IAS/Shunts: No atrial level shunt detected by color flow Doppler.  LEFT VENTRICLE PLAX 2D LVIDd:         4.35 cm  Diastology LVIDs:         2.90 cm  LV e' lateral:   9.36 cm/s LV PW:         0.80 cm  LV E/e' lateral: 8.1 LV IVS:        0.70 cm  LV e' medial:    6.31 cm/s LVOT diam:     1.90 cm  LV E/e' medial:  12.0 LV SV:         67.76 ml LV SV Index:   30.10 LVOT Area:     2.84 cm  RIGHT VENTRICLE RV S prime:     11.30 cm/s TAPSE (M-mode): 2.1 cm LEFT ATRIUM             Index       RIGHT ATRIUM           Index LA diam:        2.60 cm 1.48 cm/m  RA Area:     13.00 cm LA Vol (A2C):   36.4 ml 20.77 ml/m RA Volume:   27.80 ml  15.86 ml/m LA Vol (A4C):   20.1 ml 11.47 ml/m LA Biplane Vol: 27.5 ml 15.69 ml/m  AORTIC VALVE LVOT Vmax:   108.00 cm/s LVOT Vmean:  68.700 cm/s LVOT VTI:    0.239 m  AORTA Ao Root diam: 2.30 cm MITRAL VALVE MV Area (PHT): 2.87 cm    SHUNTS MV Decel Time: 264 msec    Systemic VTI:   0.24 m MV E velocity: 75.80 cm/s  Systemic Diam: 1.90 cm MV A velocity: 46.70 cm/s MV E/A ratio:  1.62 Buford Dresser MD Electronically signed by Buford Dresser MD Signature Date/Time: 07/30/2019/11:01:09 AM    Final    ECHO TEE  Result Date: 07/31/2019    TRANSESOPHOGEAL ECHO REPORT   Patient Name:   Sherry Williams Date of Exam: 07/31/2019 Medical Rec #:  [id number]          Height:       66.0 in Accession #:    [id number]  Weight:       146.6 lb Date of Birth:  1961/04/24           BSA:          1.75 m Patient Age:    59 years           BP:           127/72 mmHg Patient Gender: F                  HR:           62 bpm. Exam Location:  Inpatient Procedure: Transesophageal Echo and Color Doppler Indications:    stroke 434.91  History:        Patient has prior history of Echocardiogram examinations, most                 recent 07/31/2019. PE.  Sonographer:    Jannett Celestine RDCS (AE) Referring Phys: [place]: The transesophogeal probe was passed without difficulty through the esophogus of the patient. Sedation performed by performing physician. Patients was under conscious sedation during this procedure. Anesthetic administered: 74mcg of Fentanyl, 4.0mg  of Versed. Image quality was good. The patient developed no complications during the procedure. IMPRESSIONS  1. Normal LV function; redundant atrial septum; negative saline microcavitation study.  2. Left ventricular ejection fraction, by estimation, is 55 to 60%. The left ventricle has normal function. The left ventricle has no regional wall motion abnormalities. Left ventricular diastolic function could not be evaluated.  3. Right ventricular systolic function is normal. The right ventricular size is normal.  4. No left atrial/left atrial appendage thrombus was detected.  5. The mitral valve is normal in structure and function. Trivial mitral valve regurgitation.  6. The aortic valve is tricuspid. Aortic valve  regurgitation is trivial. FINDINGS  Left Ventricle: Left ventricular ejection fraction, by estimation, is 55 to 60%. The left ventricle has normal function. The left ventricle has no regional wall motion abnormalities. The left ventricular internal cavity size was normal in size. There is  no left ventricular hypertrophy. Left ventricular diastolic function could not be evaluated. Right Ventricle: The right ventricular size is normal. Right vetricular wall thickness was not assessed. Right ventricular systolic function is normal. Left Atrium: Left atrial size was normal in size. No left atrial/left atrial appendage thrombus was detected. Right Atrium: Right atrial size was normal in size. Pericardium: There is no evidence of pericardial effusion. Mitral Valve: The mitral valve is normal in structure and function. Trivial mitral valve regurgitation. Tricuspid Valve: The tricuspid valve is normal in structure. Tricuspid valve regurgitation is trivial. Aortic Valve: The aortic valve is tricuspid. Aortic valve regurgitation is trivial. Pulmonic Valve: The pulmonic valve was normal in structure. Pulmonic valve regurgitation is not visualized. Aorta: The aortic root is normal in size and structure. There is minimal (Grade I) plaque involving the descending aorta. IAS/Shunts: There is redundancy of the interatrial septum. No atrial level shunt detected by color flow Doppler. Additional Comments: Normal LV function; redundant atrial septum; negative saline microcavitation study. Kirk Ruths MD Electronically signed by Kirk Ruths MD Signature Date/Time: 07/31/2019/10:59:31 AM    Final    VAS [place] LOWER EXTREMITY VENOUS (DVT)  Result Date: 07/30/2019  Lower Venous DVTStudy Indications: Stroke, and history of DVT.  Comparison Study: 06/03/2019- bilateral lower extremity venous duplex: positive                   for age indeterminate DVT right  popliteal vein, right peroneal                   veins, right gastrocnemius  veins, left soleal veins, and left                   posterior tibial veins. Performing Technologist: Maudry Mayhew MHA, RDMS, RVT, RDCS  Examination Guidelines: A complete evaluation includes B-mode imaging, spectral Doppler, color Doppler, and power Doppler as needed of all accessible portions of each vessel. Bilateral testing is considered an integral part of a complete examination. Limited examinations for reoccurring indications may be performed as noted. The reflux portion of the exam is performed with the patient in reverse Trendelenburg.  +---------+---------------+---------+-----------+----------+--------------+ RIGHT    CompressibilityPhasicitySpontaneityPropertiesThrombus Aging +---------+---------------+---------+-----------+----------+--------------+ CFV      Full           Yes      Yes                                 +---------+---------------+---------+-----------+----------+--------------+ SFJ      Full                                                        +---------+---------------+---------+-----------+----------+--------------+ FV Prox  Full                                                        +---------+---------------+---------+-----------+----------+--------------+ FV Mid   Full                                                        +---------+---------------+---------+-----------+----------+--------------+ FV DistalFull                                                        +---------+---------------+---------+-----------+----------+--------------+ PFV      Full                                                        +---------+---------------+---------+-----------+----------+--------------+ POP      Full           Yes      Yes                                 +---------+---------------+---------+-----------+----------+--------------+ PTV      Full                                                         +---------+---------------+---------+-----------+----------+--------------+  PERO     Partial                 No                   Chronic        +---------+---------------+---------+-----------+----------+--------------+ Gastroc  Full                                                        +---------+---------------+---------+-----------+----------+--------------+   +---------+---------------+---------+-----------+----------+--------------+ LEFT     CompressibilityPhasicitySpontaneityPropertiesThrombus Aging +---------+---------------+---------+-----------+----------+--------------+ CFV      Full           Yes      Yes                                 +---------+---------------+---------+-----------+----------+--------------+ SFJ      Full                                                        +---------+---------------+---------+-----------+----------+--------------+ FV Prox  Full                                                        +---------+---------------+---------+-----------+----------+--------------+ FV Mid   Full                                                        +---------+---------------+---------+-----------+----------+--------------+ FV DistalFull                                                        +---------+---------------+---------+-----------+----------+--------------+ PFV      Full                                                        +---------+---------------+---------+-----------+----------+--------------+ POP      Full           Yes      Yes                                 +---------+---------------+---------+-----------+----------+--------------+ PTV      Full                                                        +---------+---------------+---------+-----------+----------+--------------+  PERO     Full                                                         +---------+---------------+---------+-----------+----------+--------------+ Soleal   Full                                                        +---------+---------------+---------+-----------+----------+--------------+     Summary: RIGHT: - 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 - No cystic structure found in the popliteal fossa.  LEFT: - There is no evidence of deep vein thrombosis in the lower extremity.  - No cystic structure found in the popliteal fossa.  *See table(s) above for measurements and observations. Electronically signed by Harold Barban MD on 07/30/2019 at 6:35:55 PM.    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 Hypothyroidism 4. Hyperlipidemia 5. History of endometrial cancer  -The patient has a history of PE and right lower extremity DVT diagnosed 2017.  She completed 6 months of Xarelto.  The patient developed recurrent DVT in December 2020.  She was restarted on Xarelto which she had tolerated well in the past but developed numerous small acute and subacute infarcts despite compliance with Xarelto.  She is currently on a heparin drip and tolerating this well overall.  Prior hypercoagulable work-up was negative.  Etiology of the recurrent DVT and emboli is unclear to me.  I would not place this patient back on Xarelto given the development of the emboli while on this medication.  We discussed other anticoagulation options today.  Cardiology has also suggested possible addition of antiplatelet agent to an anticoagulant.  Dr. Marin Williams to make additional recommendations regarding anticoagulant +/- antiplatelet treatment.  The patient also inquired about a second opinion at [facility]ology. Will defer this decision to Dr. Marin Williams. -Patient has a history of endometrial cancer and is following up with gynecology and GYN oncology on a regular basis.  Thank you for this referral.    Mikey Bussing, DNP, AGPCNP-BC, AOCNP  ADDENDUM: I appreciate all the great care that Ms. Rugg has received.  I am not seen her for about 2 and half years.  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  I am not sure exactly what the exact etiology is.  She had a fairly extensive work-up to date.  She was on Xarelto.  She was on Xarelto because of a recurrent thromboembolic issue.  I think that we can try her on Pradaxa.  This is a direct thrombin inhibitor.  I think this would be reasonable.  She would have a really hard time with Coumadin given that she is somewhat a vegetarian.  I think can try to manage Coumadin would be very difficult and would adversely affect her quality of life.  She has had a fairly extensive work-up.  I probably would see about carotid Dopplers make sure there is nothing going on there.  She is now on Lipitor.  She is not too happy about being on Lipitor but she understands  why the benefit of Lipitor is needed.  I am not sure if we need to get her on baby aspirin.  I will have to think about this.  I know that there is a thrombophilic clinic down at [facility].  It is hard to say whether they can provide any further input as to what is going on.  I probably would check a factor VIII and factor IX level on her.  I would probably do this when things settle down a little bit.  Again, I think Pradaxa would be reasonable.  She has good renal function.  She already has an appointment to see me in a couple weeks.  I had a nice time talking with her this morning.  It was nice to see her again.  Lattie Haw, MD  Colossians 3:23

## 2019-08-01 ENCOUNTER — Encounter (HOSPITAL_COMMUNITY): Payer: 59

## 2019-08-01 ENCOUNTER — Telehealth: Payer: Self-pay | Admitting: *Deleted

## 2019-08-01 DIAGNOSIS — I82501 Chronic embolism and thrombosis of unspecified deep veins of right lower extremity: Secondary | ICD-10-CM

## 2019-08-01 DIAGNOSIS — I6300 Cerebral infarction due to thrombosis of unspecified precerebral artery: Secondary | ICD-10-CM

## 2019-08-01 LAB — CBC WITH DIFFERENTIAL/PLATELET
Abs Immature Granulocytes: 0.01 10*3/uL (ref 0.00–0.07)
Basophils Absolute: 0 10*3/uL (ref 0.0–0.1)
Basophils Relative: 0 %
Eosinophils Absolute: 0.2 10*3/uL (ref 0.0–0.5)
Eosinophils Relative: 3 %
HCT: 35.4 % — ABNORMAL LOW (ref 36.0–46.0)
Hemoglobin: 11.8 g/dL — ABNORMAL LOW (ref 12.0–15.0)
Immature Granulocytes: 0 %
Lymphocytes Relative: 33 %
Lymphs Abs: 1.7 10*3/uL (ref 0.7–4.0)
MCH: 29.1 pg (ref 26.0–34.0)
MCHC: 33.3 g/dL (ref 30.0–36.0)
MCV: 87.2 fL (ref 80.0–100.0)
Monocytes Absolute: 0.5 10*3/uL (ref 0.1–1.0)
Monocytes Relative: 11 %
Neutro Abs: 2.6 10*3/uL (ref 1.7–7.7)
Neutrophils Relative %: 53 %
Platelets: 201 10*3/uL (ref 150–400)
RBC: 4.06 MIL/uL (ref 3.87–5.11)
RDW: 12.6 % (ref 11.5–15.5)
WBC: 5 10*3/uL (ref 4.0–10.5)
nRBC: 0 % (ref 0.0–0.2)

## 2019-08-01 LAB — HEPARIN LEVEL (UNFRACTIONATED): Heparin Unfractionated: 0.34 IU/mL (ref 0.30–0.70)

## 2019-08-01 MED ORDER — DABIGATRAN ETEXILATE MESYLATE 150 MG PO CAPS
150.0000 mg | ORAL_CAPSULE | Freq: Two times a day (BID) | ORAL | Status: DC
  Administered 2019-08-01: 150 mg via ORAL
  Filled 2019-08-01 (×2): qty 1

## 2019-08-01 MED ORDER — ATORVASTATIN CALCIUM 40 MG PO TABS: 40.0000 mg | ORAL_TABLET | Freq: Every day | ORAL | 0 refills | Status: DC

## 2019-08-01 MED ORDER — DABIGATRAN ETEXILATE MESYLATE 150 MG PO CAPS: 150.0000 mg | ORAL_CAPSULE | Freq: Two times a day (BID) | ORAL | 0 refills | Status: DC

## 2019-08-01 NOTE — Discharge Summary (Signed)
Discharge Summary  Allina Rothfeld [id number] DOB: 1961-04-17  PCP: Eulas Post, MD  Admit date: 07/29/2019 Discharge date: 08/01/2019  Time spent: 40 mins   Recommendations for Outpatient Follow-up:  1. Follow-up with PCP in 1 week 2. Follow-up with heme-onc, Dr. Marin Olp as scheduled 3. Follow-up with neurology as needed  Discharge Diagnoses:  Active Hospital Problems   Diagnosis Date Noted  . CVA (cerebral vascular accident) ([place]) 07/29/2019  . DVT (deep venous thrombosis) ([place])   . Pulmonary embolus ([place]) 08/14/2016  . Hypothyroidism, acquired, autoimmune 01/13/2016    Resolved Hospital Problems  No resolved problems to display.    Discharge Condition: Stable  Diet recommendation: Heart healthy  Vitals:   08/01/19 0748 08/01/19 1143  BP: 104/69 98/74  Pulse: (!) 55 (!) 58  Resp: 20 20  Temp: 98.9 F (37.2 C) 97.9 F (36.6 C)  SpO2: 100% 100%    History of present illness:  Kiasia Pelky a 59 y.o.femalewith history h/ohypothyroidism, DVT/PEon Xarelto, has been seeing her PCP and concern for multiple neurological symptoms since past Wednesday--she was referred for MRI today which showed new embolic pattern CVA and directed to come to the ED. Patient has been having symptoms of transient numbness, altered sensorium in her extremities as well as vision changes, headaches over the last week. She reports an episode of pleuritic right-sided chest pain 2 days back lasting about 10 minutes, not associated with nausea or vomiting or palpitations. Patient states she was diagnosed with DVT/PE in 2017 (apparently had travel history at that time) for which she was prescribed Xarelto. She had seen Dr. Marin Olp for hypercoagulable work-up at that time and taken off anticoagulation after a year. Sometime around December 2020, patient had another episode of right lower extremity DVT which prompted resumption of Xarelto. She states she had several superficial  thrombi in upper and lower extremities between 2018-2019but was told did not need treatment. She has family history of unprovoked clots in her sister.  Patient admitted for further management.  Neurology consulted.    Today, patient denies any new complaints, denies any chest pain, shortness of breath, abdominal pain, nausea/vomiting, fever/chills.  Patient advised to follow-up with her PCP in 1 week as well as heme-onc Dr. Marin Olp on 08/11/2019.   Hospital Course:  Principal Problem:   CVA (cerebral vascular accident) [facility]) Active Problems:   Hypothyroidism, acquired, autoimmune   Pulmonary embolus ([facility])   DVT (deep venous thrombosis) ([facility])   Numerous acute/early subacute infarcts within bilateral parietal cortex, occipital lobes, frontal lobe, caudate nucleus Likely embolic process MRI showed above, no evidence of hemorrhagic conversion MRA head/neck showed negative intracranial MRA Echo showed to 60-65%, no intracardiac source of embolism TEE done on 07/31/2019 showed normal LV function, borderline atrial septal aneurysm, negative saline microcavitation study A1c 5.6, LDL 107 Continue Lipitor Neurology on board, appreciate recs PT/OT- no follow up needed Follow-up with neurology as needed  Hypothyroidism TSH 2.560 Continue Armour  History of recurrent DVT/PE Negative hypercoagulable work-up in the past CTA chest done negative for PE as patient continues to complain of right-sided chest pain Heme-onc Dr. Marin Olp consulted, recommend switching anticoagulation to dabigatran, may require lifelong anticoagulation.  Follow-up with Dr. Marin Olp scheduled for 08/11/2019, will further evaluate for starting aspirin Patient is unwilling to take Coumadin due to the diet restrictions  History of endometrial cancer Status post hysterectomy and bilateral salpingo-oophorectomy on 04/27/2017        Malnutrition Type:      Malnutrition Characteristics:  Nutrition  Interventions:      Estimated body mass index is 23.24 kg/m as calculated from the following:   Height as of this encounter: 5\' 6"  (1.676 m).   Weight as of this encounter: 65.3 kg.    Procedures:  TEE on 07/31/19  Consultations:  Neurology  Heme-onc  Discharge Exam: BP 98/74 (BP Location: Right Arm)   Pulse (!) 58   Temp 97.9 F (36.6 C) (Oral)   Resp 20   Ht 5\' 6"  (1.676 m)   Wt 65.3 kg   LMP 09/04/2012   SpO2 100%   BMI 23.24 kg/m   General: NAD Cardiovascular: S1, S2 present Respiratory: CTAB Neurology: No obvious focal neurologic deficits noted    Discharge Instructions You were cared for by a hospitalist during your hospital stay. If you have any questions about your discharge medications or the care you received while you were in the hospital after you are discharged, you can call the unit and asked to speak with the hospitalist on call if the hospitalist that took care of you is not available. Once you are discharged, your primary care physician will handle any further medical issues. Please note that NO REFILLS for any discharge medications will be authorized once you are discharged, as it is imperative that you return to your primary care physician (or establish a relationship with a primary care physician if you do not have one) for your aftercare needs so that they can reassess your need for medications and monitor your lab values.  Discharge Instructions    Diet - low sodium heart healthy   Complete by: As directed    Increase activity slowly   Complete by: As directed      Allergies as of 08/01/2019      Reactions   Bee Venom Anaphylaxis   Adhesive [tape] Other (See Comments)   Irritation and red      Medication List    STOP taking these medications   rivaroxaban 20 MG Tabs tablet Commonly known as: Xarelto     TAKE these medications   Armour Thyroid 60 MG tablet Generic drug: thyroid TAKE 1 TABLET BY MOUTH EVERY DAY BEFORE BREAKFAST.  TAKE 1 DAILY 5 DAYS PER WEEK What changed: See the new instructions.   Armour Thyroid 90 MG tablet Generic drug: thyroid TAKE 1 TABLET BY MOUTH EVERY DAY TWO DAYS PER WEEK What changed: See the new instructions.   atorvastatin 40 MG tablet Commonly known as: LIPITOR Take 1 tablet (40 mg total) by mouth daily at 6 PM.   dabigatran 150 MG Caps capsule Commonly known as: PRADAXA Take 1 capsule (150 mg total) by mouth every 12 (twelve) hours.      Allergies  Allergen Reactions  . Bee Venom Anaphylaxis  . Adhesive [Tape] Other (See Comments)    Irritation and red   Follow-up Information    Eulas Post, MD. Schedule an appointment as soon as possible for a visit in 1 week(s).   Specialty: Family Medicine Contact information: [place] [address] [phone]            The results of significant diagnostics from this hospitalization (including imaging, microbiology, ancillary and laboratory) are listed below for reference.    Significant Diagnostic Studies: CT Angio Chest PE W/Cm &/Or Wo Cm  Result Date: 07/29/2019 CLINICAL DATA:  Shortness of breath EXAM: CT ANGIOGRAPHY CHEST WITH CONTRAST TECHNIQUE: Multidetector CT imaging of the chest was performed using the standard protocol  during bolus administration of intravenous contrast. Multiplanar CT image reconstructions and MIPs were obtained to evaluate the vascular anatomy. CONTRAST:  71mL OMNIPAQUE IOHEXOL 350 MG/ML SOLN COMPARISON:  CT 11/14/2017 FINDINGS: Cardiovascular: Satisfactory opacification of the pulmonary arteries to the segmental level. No evidence of pulmonary embolism. Normal heart size. No pericardial effusion. Thoracic aorta is nonaneurysmal. Mediastinum/Nodes: No enlarged mediastinal, hilar, or axillary lymph nodes. Thyroid gland, trachea, and esophagus demonstrate no significant findings. Lungs/Pleura: The lungs are clear without focal airspace consolidation, pleural effusion, or  pneumothorax. Minimal linear scarring in the anterior aspect of the right middle lobe, unchanged. Upper Abdomen: No acute abnormality. Musculoskeletal: No chest wall abnormality. No acute or significant osseous findings. Review of the MIP images confirms the above findings. IMPRESSION: 1. Negative for pulmonary embolism. 2. No active pulmonary disease. Electronically Signed   By: Davina Poke D.O.   On: 07/29/2019 15:22   MR ANGIO HEAD WO CONTRAST  Result Date: 07/29/2019 CLINICAL DATA:  59 year old female with numerous scattered small infarcts in both superior cerebral hemispheres on MRI earlier today. EXAM: MRA HEAD WITHOUT CONTRAST TECHNIQUE: Angiographic images of the Circle of Willis were obtained using MRA technique without intravenous contrast. COMPARISON:  Neck MRA and brain MRI today reported separately. FINDINGS: Codominant distal vertebral arteries are patent to the basilar without stenosis. Patent PICA origins. Patent basilar artery, AICA and SCA origins without stenosis. Bilateral PCA branches are normal. Antegrade flow in both ICA siphons. No siphon stenosis. There is a right posterior communicating artery which appears normal. The left is diminutive or absent. Patent carotid termini. Normal MCA and ACA origins. The right ACA 8 1 appears fenestrated (series 1017, image 12) but otherwise normal. Anterior communicating artery and visible ACA branches are within normal limits. Left MCA M1 segment bifurcates early without stenosis. Visible left MCA branches are within normal limits. Right MCA M1 and bifurcation are patent without stenosis. Visible right MCA branches are within normal limits. IMPRESSION: 1.  Negative intracranial MRA. 2. Normal variants: fenestrated right ACA A1 and early bifurcation of the left MCA. Electronically Signed   By: Genevie Ann M.D.   On: 07/29/2019 20:31   MR ANGIO NECK W WO CONTRAST  Result Date: 07/29/2019 CLINICAL DATA:  59 year old female with numerous scattered  small infarcts in both superior cerebral hemispheres on MRI earlier today. EXAM: MRA NECK WITHOUT AND WITH CONTRAST TECHNIQUE: Multiplanar and multiecho pulse sequences of the neck were obtained without and with intravenous contrast. Angiographic images of the neck were obtained using MRA technique without and with intravenous contrast. CONTRAST:  36mL GADAVIST GADOBUTROL 1 MMOL/ML IV SOLN COMPARISON:  Brain MRI earlier today. FINDINGS: Precontrast time-of-flight images demonstrate antegrade flow in both cervical carotid and vertebral arteries. The vertebral arteries are codominant with antegrade flow continuing to the upper cervical spine. Carotid bifurcations appear within normal limits. Post-contrast neck MRA images reveal a 3 vessel arch configuration. There is some motion artifact at the proximal great vessels but no definite hemodynamically significant proximal great vessel stenosis. The right CCA, right carotid bifurcation and cervical right ICA appear normal for age. Visible right ICA siphon and terminus appear negative. The proximal left CCA is partially obscured. Beyond that the left CCA, left carotid bifurcation and cervical left ICA are within normal limits for age. Negative visible left ICA siphon and terminus. No proximal subclavian artery or vertebral artery origin stenosis. Fairly codominant vertebral arteries are patent to the vertebrobasilar junction without stenosis. IMPRESSION: Negative Neck MRA other than suspected artifact at  the proximal left CCA. Electronically Signed   By: Genevie Ann M.D.   On: 07/29/2019 20:28   MR Brain Wo Contrast  Addendum Date: 07/29/2019   ADDENDUM REPORT: 07/29/2019 09:51 ADDENDUM: These results were called by telephone at the time of interpretation on 07/29/2019 at 9:51 am to provider Carolann Littler , who verbally acknowledged these results. Electronically Signed   By: Kellie Simmering DO   On: 07/29/2019 09:51   Result Date: 07/29/2019 CLINICAL DATA:  Other symptoms  and signs involving the nervous system. Intermittent visual disturbance, recent headache; neuro deficit, subacute. Additional history provided by technologist: Intermittent visual changes, 2 episodes of right arm heaviness, dizziness and some bilateral hearing loss, history of uterine cancer 2018 EXAM: MRI HEAD WITHOUT CONTRAST TECHNIQUE: Multiplanar, multiecho pulse sequences of the brain and surrounding structures were obtained without intravenous contrast. COMPARISON:  No pertinent prior studies available for comparison. FINDINGS: Brain: There are numerous small foci of restricted diffusion consistent with acute/early subacute infarct. These infarcts are predominantly cortically based within the bilateral parietal lobes. However, there are additional small acute/early subacute infarcts within the parietal white matter, cortical and subcortical posterolateral left frontal lobe, within the anterior right frontal lobe cortex, and within the bilateral occipital lobe cortex, as well as left caudate head. Corresponding T2/FLAIR hyperintensity at these sites. Additionally, there is a small amount of precontrast T1 hyperintensity within the right parietal cortex which may reflect cortical laminar necrosis. No chronic intracranial blood products. There is no significant mass effect. No effacement of the ventricular system or midline shift. No extra-axial fluid collection. No evidence of intracranial mass. Background mild scattered T2/FLAIR hyperintensity within the cerebral white matter is nonspecific, but consistent with chronic small vessel ischemic disease. Cerebral volume is normal for age. Vascular: Flow voids maintained within the proximal large arterial vessels. Skull and upper cervical spine: No focal marrow lesion. Sinuses/Orbits: Visualized orbits demonstrate no acute abnormality. Minimal ethmoid and maxillary sinus mucosal thickening. No significant mastoid effusion. IMPRESSION: Numerous small acute/early  subacute infarcts predominantly located within the bilateral parietal cortex. Additional small acute/early subacute infarcts within the parietal white matter, posterolateral left frontal lobe, anterior right frontal lobe and bilateral occipital lobes as well as left caudate nucleus. Given involvement of multiple vascular territories, correlate for an embolic process. No significant mass effect.  No evidence of hemorrhagic conversion. Background mild chronic small vessel ischemic disease. Electronically Signed: By: Kellie Simmering DO On: 07/29/2019 09:46   VAS [place] TRANSCRANIAL DOPPLER W BUBBLES  Result Date: 07/31/2019  Transcranial Doppler with Bubble Indications: Stroke. Comparison Study: No prior study Performing Technologist: Maudry Mayhew MHA, RDMS, RVT, RDCS  Examination Guidelines: A complete evaluation includes B-mode imaging, spectral Doppler, color Doppler, and power Doppler as needed of all accessible portions of each vessel. Bilateral testing is considered an integral part of a complete examination. Limited examinations for reoccurring indications may be performed as noted.  Summary:  A vascular evaluation was performed. The left middle cerebral artery was studied. An IV was inserted into the patient's left forearm. Verbal informed consent was obtained.  No evidence of HITS (high intensity transient signals) at rest or with Valsalva maneuver. Therefore, there is no evidence of clinically significant PFO (patent foramen ovale). Negative TCD Bubble study *See table(s) above for TCD measurements and observations.  Diagnosing physician: Antony Contras MD Electronically signed by Antony Contras MD on 07/31/2019 at 1:07:38 PM.    Final    ECHOCARDIOGRAM COMPLETE  Result Date: 07/30/2019    ECHOCARDIOGRAM  REPORT   Patient Name:   SHAILA HARDIN Date of Exam: 07/30/2019 Medical Rec #:  [id number]          Height:       66.0 in Accession #:    [id number]         Weight:       146.6 lb Date of Birth:   [birth year]           BSA:          1.75 m Patient Age:    59 years           BP:           106/72 mmHg Patient Gender: F                  HR:           60 bpm. Exam Location:  Inpatient Procedure: 2D Echo, Color Doppler and Cardiac Doppler Indications:    Stroke i163.9  History:        Patient has no prior history of Echocardiogram examinations.  Sonographer:    Raquel Sarna Senior RDCS Referring Phys: [id number] [place]  1. Left ventricular ejection fraction, by estimation, is 60 to 65%. The left ventricle has normal function. The left ventricle has no regional wall motion abnormalities. Left ventricular diastolic parameters were normal.  2. Right ventricular systolic function is normal. The right ventricular size is normal. Tricuspid regurgitation signal is inadequate for assessing PA pressure.  3. The mitral valve is normal in structure and function. Trivial mitral valve regurgitation. No evidence of mitral stenosis.  4. The aortic valve is normal in structure and function. Aortic valve regurgitation is not visualized. No aortic stenosis is present.  5. The inferior vena cava is normal in size with greater than 50% respiratory variability, suggesting right atrial pressure of 3 mmHg. Comparison(s): No prior Echocardiogram. Conclusion(s)/Recomendation(s): No intracardiac source of embolism detected on this transthoracic study. A transesophageal echocardiogram is recommended to exclude cardiac source of embolism if clinically indicated. FINDINGS  Left Ventricle: Left ventricular ejection fraction, by estimation, is 60 to 65%. The left ventricle has normal function. The left ventricle has no regional wall motion abnormalities. The left ventricular internal cavity size was normal in size. There is  no left ventricular hypertrophy. Left ventricular diastolic parameters were normal. Right Ventricle: The right ventricular size is normal. No increase in right ventricular wall thickness. Right ventricular  systolic function is normal. Tricuspid regurgitation signal is inadequate for assessing PA pressure. Left Atrium: Left atrial size was normal in size. Right Atrium: Right atrial size was normal in size. Pericardium: There is no evidence of pericardial effusion. Mitral Valve: The mitral valve is normal in structure and function. Trivial mitral valve regurgitation. No evidence of mitral valve stenosis. Tricuspid Valve: The tricuspid valve is normal in structure. Tricuspid valve regurgitation is trivial. No evidence of tricuspid stenosis. Aortic Valve: The aortic valve is normal in structure and function. Aortic valve regurgitation is not visualized. No aortic stenosis is present. Pulmonic Valve: The pulmonic valve was grossly normal. Pulmonic valve regurgitation is not visualized. No evidence of pulmonic stenosis. Aorta: The aortic root, ascending aorta and aortic arch are all structurally normal, with no evidence of dilitation or obstruction. Venous: The inferior vena cava is normal in size with greater than 50% respiratory variability, suggesting right atrial pressure of 3 mmHg. IAS/Shunts: No atrial level shunt detected by color flow Doppler.  LEFT VENTRICLE PLAX 2D LVIDd:  4.35 cm  Diastology LVIDs:         2.90 cm  LV e' lateral:   9.36 cm/s LV PW:         0.80 cm  LV E/e' lateral: 8.1 LV IVS:        0.70 cm  LV e' medial:    6.31 cm/s LVOT diam:     1.90 cm  LV E/e' medial:  12.0 LV SV:         67.76 ml LV SV Index:   30.10 LVOT Area:     2.84 cm  RIGHT VENTRICLE RV S prime:     11.30 cm/s TAPSE (M-mode): 2.1 cm LEFT ATRIUM             Index       RIGHT ATRIUM           Index LA diam:        2.60 cm 1.48 cm/m  RA Area:     13.00 cm LA Vol (A2C):   36.4 ml 20.77 ml/m RA Volume:   27.80 ml  15.86 ml/m LA Vol (A4C):   20.1 ml 11.47 ml/m LA Biplane Vol: 27.5 ml 15.69 ml/m  AORTIC VALVE LVOT Vmax:   108.00 cm/s LVOT Vmean:  68.700 cm/s LVOT VTI:    0.239 m  AORTA Ao Root diam: 2.30 cm MITRAL VALVE MV  Area (PHT): 2.87 cm    SHUNTS MV Decel Time: 264 msec    Systemic VTI:  0.24 m MV E velocity: 75.80 cm/s  Systemic Diam: 1.90 cm MV A velocity: 46.70 cm/s MV E/A ratio:  1.62 Buford Dresser MD Electronically signed by Buford Dresser MD Signature Date/Time: 07/30/2019/11:01:09 AM    Final    ECHO TEE  Result Date: 07/31/2019    TRANSESOPHOGEAL ECHO REPORT   Patient Name:   HEAVYNN VANDERHYDE Date of Exam: 07/31/2019 Medical Rec #:  [id number]          Height:       66.0 in Accession #:    [id number]         Weight:       146.6 lb Date of Birth:  [birth year]           BSA:          1.75 m Patient Age:    59 years           BP:           127/72 mmHg Patient Gender: F                  HR:           62 bpm. Exam Location:  Inpatient Procedure: Transesophageal Echo and Color Doppler Indications:    stroke 434.91  History:        Patient has prior history of Echocardiogram examinations, most                 recent 07/31/2019. PE.  Sonographer:    Jannett Celestine RDCS (AE) Referring Phys: [place]: The transesophogeal probe was passed without difficulty through the esophogus of the patient. Sedation performed by performing physician. Patients was under conscious sedation during this procedure. Anesthetic administered: 74mcg of Fentanyl, 4.0mg  of Versed. Image quality was good. The patient developed no complications during the procedure. IMPRESSIONS  1. Normal LV function; redundant atrial septum; negative saline microcavitation study.  2. Left ventricular ejection fraction, by estimation, is 55 to 60%. The  left ventricle has normal function. The left ventricle has no regional wall motion abnormalities. Left ventricular diastolic function could not be evaluated.  3. Right ventricular systolic function is normal. The right ventricular size is normal.  4. No left atrial/left atrial appendage thrombus was detected.  5. The mitral valve is normal in structure and function. Trivial mitral valve  regurgitation.  6. The aortic valve is tricuspid. Aortic valve regurgitation is trivial. FINDINGS  Left Ventricle: Left ventricular ejection fraction, by estimation, is 55 to 60%. The left ventricle has normal function. The left ventricle has no regional wall motion abnormalities. The left ventricular internal cavity size was normal in size. There is  no left ventricular hypertrophy. Left ventricular diastolic function could not be evaluated. Right Ventricle: The right ventricular size is normal. Right vetricular wall thickness was not assessed. Right ventricular systolic function is normal. Left Atrium: Left atrial size was normal in size. No left atrial/left atrial appendage thrombus was detected. Right Atrium: Right atrial size was normal in size. Pericardium: There is no evidence of pericardial effusion. Mitral Valve: The mitral valve is normal in structure and function. Trivial mitral valve regurgitation. Tricuspid Valve: The tricuspid valve is normal in structure. Tricuspid valve regurgitation is trivial. Aortic Valve: The aortic valve is tricuspid. Aortic valve regurgitation is trivial. Pulmonic Valve: The pulmonic valve was normal in structure. Pulmonic valve regurgitation is not visualized. Aorta: The aortic root is normal in size and structure. There is minimal (Grade I) plaque involving the descending aorta. IAS/Shunts: There is redundancy of the interatrial septum. No atrial level shunt detected by color flow Doppler. Additional Comments: Normal LV function; redundant atrial septum; negative saline microcavitation study. Kirk Ruths MD Electronically signed by Kirk Ruths MD Signature Date/Time: 07/31/2019/10:59:31 AM    Final    VAS [place] LOWER EXTREMITY VENOUS (DVT)  Result Date: 07/30/2019  Lower Venous DVTStudy Indications: Stroke, and history of DVT.  Comparison Study: 06/03/2019- bilateral lower extremity venous duplex: positive                   for age indeterminate DVT right popliteal  vein, right peroneal                   veins, right gastrocnemius veins, left soleal veins, and left                   posterior tibial veins. Performing Technologist: Maudry Mayhew MHA, RDMS, RVT, RDCS  Examination Guidelines: A complete evaluation includes B-mode imaging, spectral Doppler, color Doppler, and power Doppler as needed of all accessible portions of each vessel. Bilateral testing is considered an integral part of a complete examination. Limited examinations for reoccurring indications may be performed as noted. The reflux portion of the exam is performed with the patient in reverse Trendelenburg.  +---------+---------------+---------+-----------+----------+--------------+ RIGHT    CompressibilityPhasicitySpontaneityPropertiesThrombus Aging +---------+---------------+---------+-----------+----------+--------------+ CFV      Full           Yes      Yes                                 +---------+---------------+---------+-----------+----------+--------------+ SFJ      Full                                                        +---------+---------------+---------+-----------+----------+--------------+  FV Prox  Full                                                        +---------+---------------+---------+-----------+----------+--------------+ FV Mid   Full                                                        +---------+---------------+---------+-----------+----------+--------------+ FV DistalFull                                                        +---------+---------------+---------+-----------+----------+--------------+ PFV      Full                                                        +---------+---------------+---------+-----------+----------+--------------+ POP      Full           Yes      Yes                                 +---------+---------------+---------+-----------+----------+--------------+ PTV      Full                                                         +---------+---------------+---------+-----------+----------+--------------+ PERO     Partial                 No                   Chronic        +---------+---------------+---------+-----------+----------+--------------+ Gastroc  Full                                                        +---------+---------------+---------+-----------+----------+--------------+   +---------+---------------+---------+-----------+----------+--------------+ LEFT     CompressibilityPhasicitySpontaneityPropertiesThrombus Aging +---------+---------------+---------+-----------+----------+--------------+ CFV      Full           Yes      Yes                                 +---------+---------------+---------+-----------+----------+--------------+ SFJ      Full                                                        +---------+---------------+---------+-----------+----------+--------------+  FV Prox  Full                                                        +---------+---------------+---------+-----------+----------+--------------+ FV Mid   Full                                                        +---------+---------------+---------+-----------+----------+--------------+ FV DistalFull                                                        +---------+---------------+---------+-----------+----------+--------------+ PFV      Full                                                        +---------+---------------+---------+-----------+----------+--------------+ POP      Full           Yes      Yes                                 +---------+---------------+---------+-----------+----------+--------------+ PTV      Full                                                        +---------+---------------+---------+-----------+----------+--------------+ PERO     Full                                                         +---------+---------------+---------+-----------+----------+--------------+ Soleal   Full                                                        +---------+---------------+---------+-----------+----------+--------------+     Summary: RIGHT: - 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 - No cystic structure found in the popliteal fossa.  LEFT: - There is no evidence of deep vein thrombosis in the lower extremity.  - No cystic structure found in the popliteal fossa.  *See table(s) above for measurements and observations. Electronically signed by Harold Barban MD on 07/30/2019 at 6:35:55 PM.    Final     Microbiology: Recent Results (from the past 240 hour(s))  SARS CORONAVIRUS 2 (TAT 6-24 HRS) Nasopharyngeal Nasopharyngeal Swab     Status: None   Collection Time: 07/29/19  3:40 PM  Specimen: Nasopharyngeal Swab  Result Value Ref Range Status   SARS Coronavirus 2 NEGATIVE NEGATIVE Final    Comment: (NOTE) SARS-CoV-2 target nucleic acids are NOT DETECTED. The SARS-CoV-2 RNA is generally detectable in upper and lower respiratory specimens during the acute phase of infection. Negative results do not preclude SARS-CoV-2 infection, do not rule out co-infections with other pathogens, and should not be used as the sole basis for treatment or other patient management decisions. Negative results must be combined with clinical observations, patient history, and epidemiological information. The expected result is Negative. Fact Sheet for Patients: SugarRoll.be Fact Sheet for Healthcare Providers: https://www.woods-mathews.com/ This test is not yet approved or cleared by the [place] FDA and  has been authorized for detection and/or diagnosis of SARS-CoV-2 by FDA under an Emergency Use Authorization (EUA). This EUA will remain  in effect (meaning this test can be used) for the duration of the COVID-19  declaration under Section 56 4(b)(1) of the Act, 21 U.S.C. section 360bbb-3(b)(1), unless the authorization is terminated or revoked sooner. Performed at [facility], [place] [address] [place], [place] [address]      Labs: Basic Metabolic Panel: Recent Labs  Lab 07/29/19 1102 07/31/19 0315  NA 142 141  K 3.8 3.8  CL 105 107  CO2 27 23  GLUCOSE 96 92  BUN 14 12  CREATININE 0.91 0.76  CALCIUM 9.6 8.9   Liver Function Tests: Recent Labs  Lab 07/29/19 1102  AST 26  ALT 20  ALKPHOS 64  BILITOT 0.5  PROT 6.7  ALBUMIN 4.1   No results for input(s): LIPASE, AMYLASE in the last 168 hours. No results for input(s): AMMONIA in the last 168 hours. CBC: Recent Labs  Lab 07/29/19 1102 07/30/19 0239 07/31/19 0315 08/01/19 0447  WBC 5.5 5.5 4.7 5.0  NEUTROABS 3.6  --  2.1 2.6  HGB 12.8 11.7* 11.8* 11.8*  HCT 39.5 35.1* 35.6* 35.4*  MCV 89.6 87.5 88.3 87.2  PLT 179 165 196 201   Cardiac Enzymes: No results for input(s): CKTOTAL, CKMB, CKMBINDEX, TROPONINI in the last 168 hours. BNP: BNP (last 3 results) No results for input(s): BNP in the last 8760 hours.  ProBNP (last 3 results) No results for input(s): PROBNP in the last 8760 hours.  CBG: No results for input(s): GLUCAP in the last 168 hours.     Signed:  Alma Friendly, MD [facility] 08/01/2019, 12:10 PM

## 2019-08-01 NOTE — Progress Notes (Signed)
STROKE TEAM PROGRESS NOTE   INTERVAL HISTORY She has no complaints.  She met Dr. Marin Williams earlier this morning who recommended switching Xarelto to Pradaxa.  Carotid ultrasound has been ordered for unclear reason but patient had MRI of the neck which was normal hence I do not think it is necessary Vitals:   08/01/19 0014 08/01/19 0502 08/01/19 0748 08/01/19 1143  BP: 102/63 101/63 104/69 98/74  Pulse: (!) 53 (!) 53 (!) 55 (!) 58  Resp: '18 16 20 20  ' Temp: 97.7 F (36.5 C) (!) 97.4 F (36.3 C) 98.9 F (37.2 C) 97.9 F (36.6 C)  TempSrc: Oral Oral Oral Oral  SpO2: 99% 97% 100% 100%  Weight:      Height:        CBC:  Recent Labs  Lab 07/31/19 0315 08/01/19 0447  WBC 4.7 5.0  NEUTROABS 2.1 2.6  HGB 11.8* 11.8*  HCT 35.6* 35.4*  MCV 88.3 87.2  PLT 196 001    Basic Metabolic Panel:  Recent Labs  Lab 07/29/19 1102 07/31/19 0315  NA 142 141  K 3.8 3.8  CL 105 107  CO2 27 23  GLUCOSE 96 92  BUN 14 12  CREATININE 0.91 0.76  CALCIUM 9.6 8.9   Lipid Panel:     Component Value Date/Time   CHOL 172 07/30/2019 0239   TRIG 37 07/30/2019 0239   HDL 58 07/30/2019 0239   CHOLHDL 3.0 07/30/2019 0239   VLDL 7 07/30/2019 0239   LDLCALC 107 (H) 07/30/2019 0239   HgbA1c:  Lab Results  Component Value Date   HGBA1C 5.6 07/30/2019   Urine Drug Screen: No results found for: LABOPIA, COCAINSCRNUR, LABBENZ, AMPHETMU, THCU, LABBARB  Alcohol Level No results found for: ETH  IMAGING past 48 hours VAS [place] TRANSCRANIAL DOPPLER W BUBBLES  Result Date: 07/31/2019  Transcranial Doppler with Bubble Indications: Stroke. Comparison Study: No prior study Performing Technologist: Maudry Mayhew MHA, RDMS, RVT, RDCS  Examination Guidelines: A complete evaluation includes B-mode imaging, spectral Doppler, color Doppler, and power Doppler as needed of all accessible portions of each vessel. Bilateral testing is considered an integral part of a complete examination. Limited examinations for  reoccurring indications may be performed as noted.  Summary:  A vascular evaluation was performed. The left middle cerebral artery was studied. An IV was inserted into the patient's left forearm. Verbal informed consent was obtained.  No evidence of HITS (high intensity transient signals) at rest or with Valsalva maneuver. Therefore, there is no evidence of clinically significant PFO (patent foramen ovale). Negative TCD Bubble study *See table(s) above for TCD measurements and observations.  Diagnosing physician: Antony Contras MD Electronically signed by Antony Contras MD on 07/31/2019 at 1:07:38 PM.    Final    ECHO TEE  Result Date: 07/31/2019    TRANSESOPHOGEAL ECHO REPORT   Patient Name:   Sherry Williams Date of Exam: 07/31/2019 Medical Rec #:  [id number]          Height:       66.0 in Accession #:    [id number]         Weight:       146.6 lb Date of Birth:  1960/06/30           BSA:          1.75 m Patient Age:    58 years           BP:           127/72  mmHg Patient Gender: F                  HR:           62 bpm. Exam Location:  Inpatient Procedure: Transesophageal Echo and Color Doppler Indications:    stroke 434.91  History:        Patient has prior history of Echocardiogram examinations, most                 recent 07/31/2019. PE.  Sonographer:    Jannett Celestine RDCS (AE) Referring Phys: [place]: The transesophogeal probe was passed without difficulty through the esophogus of the patient. Sedation performed by performing physician. Patients was under conscious sedation during this procedure. Anesthetic administered: 48mg of Fentanyl, 4.058mof Versed. Image quality was good. The patient developed no complications during the procedure. IMPRESSIONS  1. Normal LV function; redundant atrial septum; negative saline microcavitation study.  2. Left ventricular ejection fraction, by estimation, is 55 to 60%. The left ventricle has normal function. The left ventricle has no regional wall  motion abnormalities. Left ventricular diastolic function could not be evaluated.  3. Right ventricular systolic function is normal. The right ventricular size is normal.  4. No left atrial/left atrial appendage thrombus was detected.  5. The mitral valve is normal in structure and function. Trivial mitral valve regurgitation.  6. The aortic valve is tricuspid. Aortic valve regurgitation is trivial. FINDINGS  Left Ventricle: Left ventricular ejection fraction, by estimation, is 55 to 60%. The left ventricle has normal function. The left ventricle has no regional wall motion abnormalities. The left ventricular internal cavity size was normal in size. There is  no left ventricular hypertrophy. Left ventricular diastolic function could not be evaluated. Right Ventricle: The right ventricular size is normal. Right vetricular wall thickness was not assessed. Right ventricular systolic function is normal. Left Atrium: Left atrial size was normal in size. No left atrial/left atrial appendage thrombus was detected. Right Atrium: Right atrial size was normal in size. Pericardium: There is no evidence of pericardial effusion. Mitral Valve: The mitral valve is normal in structure and function. Trivial mitral valve regurgitation. Tricuspid Valve: The tricuspid valve is normal in structure. Tricuspid valve regurgitation is trivial. Aortic Valve: The aortic valve is tricuspid. Aortic valve regurgitation is trivial. Pulmonic Valve: The pulmonic valve was normal in structure. Pulmonic valve regurgitation is not visualized. Aorta: The aortic root is normal in size and structure. There is minimal (Grade I) plaque involving the descending aorta. IAS/Shunts: There is redundancy of the interatrial septum. No atrial level shunt detected by color flow Doppler. Additional Comments: Normal LV function; redundant atrial septum; negative saline microcavitation study. BrKirk RuthsD Electronically signed by BrKirk RuthsD Signature  Date/Time: 07/31/2019/10:59:31 AM    Final    VAS [place]OWER EXTREMITY VENOUS (DVT)  Result Date: 07/30/2019  Lower Venous DVTStudy Indications: Stroke, and history of DVT.  Comparison Study: 06/03/2019- bilateral lower extremity venous duplex: positive                   for age indeterminate DVT right popliteal vein, right peroneal                   veins, right gastrocnemius veins, left soleal veins, and left                   posterior tibial veins. Performing Technologist: MiMaudry MayhewHA, RDMS, RVT, RDCS  Examination Guidelines: A  complete evaluation includes B-mode imaging, spectral Doppler, color Doppler, and power Doppler as needed of all accessible portions of each vessel. Bilateral testing is considered an integral part of a complete examination. Limited examinations for reoccurring indications may be performed as noted. The reflux portion of the exam is performed with the patient in reverse Trendelenburg.  +---------+---------------+---------+-----------+----------+--------------+ RIGHT    CompressibilityPhasicitySpontaneityPropertiesThrombus Aging +---------+---------------+---------+-----------+----------+--------------+ CFV      Full           Yes      Yes                                 +---------+---------------+---------+-----------+----------+--------------+ SFJ      Full                                                        +---------+---------------+---------+-----------+----------+--------------+ FV Prox  Full                                                        +---------+---------------+---------+-----------+----------+--------------+ FV Mid   Full                                                        +---------+---------------+---------+-----------+----------+--------------+ FV DistalFull                                                        +---------+---------------+---------+-----------+----------+--------------+ PFV      Full                                                         +---------+---------------+---------+-----------+----------+--------------+ POP      Full           Yes      Yes                                 +---------+---------------+---------+-----------+----------+--------------+ PTV      Full                                                        +---------+---------------+---------+-----------+----------+--------------+ PERO     Partial                 No                   Chronic        +---------+---------------+---------+-----------+----------+--------------+ Gastroc  Full                                                        +---------+---------------+---------+-----------+----------+--------------+   +---------+---------------+---------+-----------+----------+--------------+ LEFT     CompressibilityPhasicitySpontaneityPropertiesThrombus Aging +---------+---------------+---------+-----------+----------+--------------+ CFV      Full           Yes      Yes                                 +---------+---------------+---------+-----------+----------+--------------+ SFJ      Full                                                        +---------+---------------+---------+-----------+----------+--------------+ FV Prox  Full                                                        +---------+---------------+---------+-----------+----------+--------------+ FV Mid   Full                                                        +---------+---------------+---------+-----------+----------+--------------+ FV DistalFull                                                        +---------+---------------+---------+-----------+----------+--------------+ PFV      Full                                                        +---------+---------------+---------+-----------+----------+--------------+ POP      Full           Yes      Yes                                  +---------+---------------+---------+-----------+----------+--------------+ PTV      Full                                                        +---------+---------------+---------+-----------+----------+--------------+ PERO     Full                                                        +---------+---------------+---------+-----------+----------+--------------+  Soleal   Full                                                        +---------+---------------+---------+-----------+----------+--------------+     Summary: RIGHT: - 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 - No cystic structure found in the popliteal fossa.  LEFT: - There is no evidence of deep vein thrombosis in the lower extremity.  - No cystic structure found in the popliteal fossa.  *See table(s) above for measurements and observations. Electronically signed by Harold Barban MD on 07/30/2019 at 6:35:55 PM.    Final     PHYSICAL EXAM Pleasant middle-aged Caucasian lady not in distress. . Afebrile. Head is nontraumatic. Neck is supple without bruit.    Cardiac exam no murmur or gallop. Lungs are clear to auscultation. Distal pulses are well felt. Neurological Exam ;  Awake  Alert oriented x 3.     Normal speech and language.eye movements full without nystagmus.fundi were not visualized. Vision acuity and fields appear normal. Hearing is normal. Palatal movements are normal. Face symmetric. Tongue midline. Normal strength, tone, reflexes and coordination. Normal sensation. Gait deferred.  ASSESSMENT/PLAN [name] is a 59 y.o. 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 OP MRI showed multiple B infarcts.   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 Normal variants.  MRA neck  Negative. Artifact L CCA  LE doppler  pending   Transcranial Doppler w/ bubble performed personally at the bedside was negative for right-to-left shunt  2D Echo EF 60-65%. No source of embolus   TEE  Normal. No PFO or clot.  LDL 107  HgbA1c 5.6  IV heparin for VTE prophylaxis  Xarelto (rivaroxaban) daily prior to admission, now on heparin IV.   Therapy recommendations:  No therapy needs  Disposition:  Return home  Hypercoagulable State  Hx PE 2017, genetic workup neg.   SVTs arms and legs fall 2020  RLE DVT 05/2019.   On on xarelto PTA - no on IV heparin  Bilateral LE dopplers pending  TCD bubble negative for PFO  TEE to look for PFO. Arranged with [place] for tomorrow.   (I have made patient NPO after midnight tonight).    Followed by Dr. Marin Williams  Hyperlipidemia  Home meds:  No statin  On lipitor 40  LDL 107, goal < 70  Continue statin at discharge  Other Stroke Risk Factors  ETOH use,  advised to drink no more than 1 drink(s) a day  Family hx stroke (father)  Other Active Problems  Hypothyroid    Hx endometrial cancer  Hospital day # 3   .  Agree with changing Xarelto to Pradaxa.  Cancel carotid ultrasound as MRA neck was normal.   Long discussion with the patient  at the bedside and with Dr. Horris Latino and answered questions.  Greater than 50% time during this 25-minute  visit was spent in counseling and coordination of care about her embolic strokes and discussion about evaluation, treatment and answering questions.  Stroke team will sign off.  Kindly call for questions.  Follow-up as an outpatient stroke clinic in 6 weeks  Antony Contras, [place] Pager:  [phone] 08/01/2019 3:04 PM    To contact Stroke Continuity provider, please refer to [email or web address] After hours, contact General Neurology

## 2019-08-01 NOTE — Social Work (Signed)
CSW met with patient to confirm that she can afford the Pradaxa, patient appreciative of information. No other TOC needs at this time, patient to discharge home today.  Laveda Abbe, [place] Clinical Social Worker [phone]

## 2019-08-01 NOTE — Progress Notes (Signed)
Patient dc'd today. AOX4 verbalized understanding of dc'd order. Education provided. Iv lines removed tip intact

## 2019-08-01 NOTE — TOC Benefit Eligibility Note (Signed)
Transition of Care [facility]) Benefit Eligibility Note    Patient Details  Name: Sherry Williams MRN: [id number] Date of Birth: 05-06-1961   Medication/Dose: Pradaxa 150mg  twice a day  Covered?: Yes  Tier: 2 Drug  Prescription Coverage Preferred Pharmacy: Any retail Pharmacy  Spoke with Person/Company/Phone Number:: Elise/ [employer]/ [phone]  Co-Pay: 35.00 for a 30 day supply  Prior Approval: No  Deductible: (No Deducible)       Orbie Pyo Phone Number: 08/01/2019, 10:34 AM

## 2019-08-01 NOTE — Telephone Encounter (Signed)
Message received from patient wanting to know if there is anything cheaper for her to take than Pradaxa.  Message left on patient's private cell phone to inform her that Dr. Marin Olp is out of the office today, to continue taking Pradaxa as prescribed and that I would call her back on Monday after speaking with Dr. Marin Olp.

## 2019-08-04 ENCOUNTER — Encounter: Payer: Self-pay | Admitting: Hematology & Oncology

## 2019-08-07 ENCOUNTER — Encounter: Payer: Self-pay | Admitting: Hematology & Oncology

## 2019-08-11 ENCOUNTER — Inpatient Hospital Stay: Payer: 59 | Attending: Hematology & Oncology | Admitting: Hematology & Oncology

## 2019-08-11 ENCOUNTER — Inpatient Hospital Stay: Payer: 59

## 2019-08-11 ENCOUNTER — Encounter: Payer: Self-pay | Admitting: Hematology & Oncology

## 2019-08-11 ENCOUNTER — Other Ambulatory Visit: Payer: Self-pay

## 2019-08-11 VITALS — BP 108/77 | HR 65 | Temp 97.1°F | Resp 16 | Wt 145.0 lb

## 2019-08-11 DIAGNOSIS — E063 Autoimmune thyroiditis: Secondary | ICD-10-CM

## 2019-08-11 DIAGNOSIS — I2609 Other pulmonary embolism with acute cor pulmonale: Secondary | ICD-10-CM | POA: Diagnosis not present

## 2019-08-11 DIAGNOSIS — I82461 Acute embolism and thrombosis of right calf muscular vein: Secondary | ICD-10-CM | POA: Diagnosis not present

## 2019-08-11 DIAGNOSIS — I6309 Cerebral infarction due to thrombosis of other precerebral artery: Secondary | ICD-10-CM

## 2019-08-11 DIAGNOSIS — Z8673 Personal history of transient ischemic attack (TIA), and cerebral infarction without residual deficits: Secondary | ICD-10-CM | POA: Diagnosis not present

## 2019-08-11 DIAGNOSIS — I2699 Other pulmonary embolism without acute cor pulmonale: Secondary | ICD-10-CM | POA: Diagnosis not present

## 2019-08-11 DIAGNOSIS — I82431 Acute embolism and thrombosis of right popliteal vein: Secondary | ICD-10-CM | POA: Diagnosis not present

## 2019-08-11 DIAGNOSIS — Z7901 Long term (current) use of anticoagulants: Secondary | ICD-10-CM | POA: Insufficient documentation

## 2019-08-11 DIAGNOSIS — I82451 Acute embolism and thrombosis of right peroneal vein: Secondary | ICD-10-CM | POA: Diagnosis not present

## 2019-08-11 LAB — CBC WITH DIFFERENTIAL (CANCER CENTER ONLY)
Abs Immature Granulocytes: 0.01 10*3/uL (ref 0.00–0.07)
Basophils Absolute: 0 10*3/uL (ref 0.0–0.1)
Basophils Relative: 1 %
Eosinophils Absolute: 0.1 10*3/uL (ref 0.0–0.5)
Eosinophils Relative: 2 %
HCT: 36.3 % (ref 36.0–46.0)
Hemoglobin: 12 g/dL (ref 12.0–15.0)
Immature Granulocytes: 0 %
Lymphocytes Relative: 29 %
Lymphs Abs: 1.3 10*3/uL (ref 0.7–4.0)
MCH: 28.8 pg (ref 26.0–34.0)
MCHC: 33.1 g/dL (ref 30.0–36.0)
MCV: 87.3 fL (ref 80.0–100.0)
Monocytes Absolute: 0.4 10*3/uL (ref 0.1–1.0)
Monocytes Relative: 9 %
Neutro Abs: 2.6 10*3/uL (ref 1.7–7.7)
Neutrophils Relative %: 59 %
Platelet Count: 268 10*3/uL (ref 150–400)
RBC: 4.16 MIL/uL (ref 3.87–5.11)
RDW: 12.6 % (ref 11.5–15.5)
WBC Count: 4.4 10*3/uL (ref 4.0–10.5)
nRBC: 0 % (ref 0.0–0.2)

## 2019-08-11 LAB — CMP (CANCER CENTER ONLY)
ALT: 25 U/L (ref 0–44)
AST: 21 U/L (ref 15–41)
Albumin: 4.6 g/dL (ref 3.5–5.0)
Alkaline Phosphatase: 62 U/L (ref 38–126)
Anion gap: 8 (ref 5–15)
BUN: 15 mg/dL (ref 6–20)
CO2: 29 mmol/L (ref 22–32)
Calcium: 9.8 mg/dL (ref 8.9–10.3)
Chloride: 105 mmol/L (ref 98–111)
Creatinine: 0.92 mg/dL (ref 0.44–1.00)
GFR, Est AFR Am: >60 mL/min (ref >60)
GFR, Estimated: >60 mL/min (ref >60)
Glucose, Bld: 116 mg/dL — ABNORMAL HIGH (ref 70–99)
Potassium: 4 mmol/L (ref 3.5–5.1)
Sodium: 142 mmol/L (ref 135–145)
Total Bilirubin: 0.4 mg/dL (ref 0.3–1.2)
Total Protein: 7.1 g/dL (ref 6.5–8.1)

## 2019-08-11 NOTE — Progress Notes (Signed)
Hematology and Oncology Follow Up Visit  Sherry Williams [id number] 02/13/1961 58 y.o. 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 twice daily-started on 08/01/2019     Interim History:  Sherry Williams is back for a long awaited visit.  We had seen her probably 3-1/2 years ago.  At that time, she had a idiopathic pulmonary embolus and a right lower extremity thrombus.  We did not see her back after that.  I am not sure as to why this was.  We had her on Xarelto for 6 months.  Since then, she have not seen her family doctor.  Back in October 2020, she had a superficial thrombus in the right lower leg.  I am not sure she was treated with any anticoagulation.  She may have just been given aspirin.  In December 2020 she had further swelling and pain in the right leg.  She was found to have a age-indeterminate DVT involving the right popliteal vein, right peroneal vein and right gastrocnemius vein.  Then, in February, she began to have some dizziness.  She I think may have had some blurred vision.  She was subsequently found to have a multi-infarct CVA on MRI.  An MRI that was done on 07/29/2019.  She had small acute infarcts within the bilateral parietal cortex.  Also noted was some infarcts in the left frontal lobe, occipital lobes, left caudate nucleus.  It is felt this was all from an embolic source "downstream."  She was admitted.  She was I think started on heparin.  All of her studies were really unremarkable.  She had MRI angio of the neck which was unremarkable.  She had MR angio of the brain which also was unremarkable.  She had a TEE done.  This was done on 07/31/2019.  This really did not show [place] anything that looks like embolic.  She had ejection fraction of 55-60%.  There is no evidence of any thrombus.  Her valves looked okay.  She was subsequently placed on anticoagulation  with Pradaxa.  She was put on Lipitor.  She was seen by neurology in the hospital.  She is a big believer of homeopathic medications.  She really does not want to be on Lipitor.  I told her that this is some as she has to talk to her neurologist about.  I did know she needs to be on any aspirin.  Again she probably wants to avoid aspirin and try some natural alternative to aspirin, referred to as arnica [place].  Again I am not familiar with this medication.  She has had a fairly thorough hypercoagulable work-up when we first saw her back in 2018.  All of this was unremarkable.  She has not been taking any estrogens.  She does not smoke.  She has not been sedentary.  She has not had any type of long distance travel.  She feels little bit tired right now.  I told her that is  Take a month or so to get through the hospitalization that she had.  For each day in the hospital takes about a week to recover.  She would like to see the hypercoagulable clinic down at [facility].  I have no problems with this.  I am not sure exactly what they will be able to uncover but they certainly are a very good resource for [place].  She has  had no bleeding.  She has had no change in bowel or bladder habits.  She had a lot of diarrhea when she started the Pradaxa but this is better.  She is worried about her thyroid.  I told her that her endocrinologist is her family doctor really needs to be taking care of this.  She I guess is not seen him for quite a while.  We will send a thyroid panel off on her.  Currently, her performance status is ECOG 1.  Medications:  Current Outpatient Medications:  .  ARMOUR THYROID 60 MG tablet, TAKE 1 TABLET BY MOUTH EVERY DAY BEFORE BREAKFAST. TAKE 1 DAILY 5 DAYS PER WEEK (Patient taking differently: Take 60 mg by mouth See admin instructions. Taking 60mg  5 days a week), Disp: 90 tablet, Rfl: 2 .  ARMOUR THYROID 90 MG tablet, TAKE 1 TABLET BY MOUTH EVERY DAY TWO DAYS PER WEEK (Patient  taking differently: Take 90 mg by mouth See admin instructions. Taking 90mg  2 days a week  ( could not provide schedule)), Disp: 90 tablet, Rfl: 1 .  atorvastatin (LIPITOR) 40 MG tablet, Take 1 tablet (40 mg total) by mouth daily at 6 PM., Disp: 30 tablet, Rfl: 0 .  dabigatran (PRADAXA) 150 MG CAPS capsule, Take 1 capsule (150 mg total) by mouth every 12 (twelve) hours., Disp: 60 capsule, Rfl: 0  Allergies:  Allergies  Allergen Reactions  . Bee Venom Anaphylaxis  . Adhesive [Tape] Other (See Comments)    Irritation and red    Past Medical History, Surgical history, Social history, and Family History were reviewed and updated.  Review of Systems: Review of Systems  Constitutional: Positive for fatigue.  HENT:  Negative.   Eyes: Negative.   Respiratory: Negative.   Cardiovascular: Negative.   Gastrointestinal: Negative.   Endocrine: Negative.   Genitourinary: Negative.    Musculoskeletal: Negative.   Skin: Negative.   Neurological: Negative.   Hematological: Negative.   Psychiatric/Behavioral: Negative.     Physical Exam:  weight is 145 lb (65.8 kg). Her temporal temperature is 97.1 F (36.2 C) (abnormal). Her blood pressure is 108/77 and her pulse is 65. Her respiration is 16.   Wt Readings from Last 3 Encounters:  08/11/19 145 lb (65.8 kg)  07/31/19 144 lb (65.3 kg)  07/23/19 146 lb 9.6 oz (66.5 kg)    Physical Exam Vitals reviewed.  HENT:     Head: Normocephalic and atraumatic.  Eyes:     Pupils: Pupils are equal, round, and reactive to light.  Cardiovascular:     Rate and Rhythm: Normal rate and regular rhythm.     Heart sounds: Normal heart sounds.  Pulmonary:     Effort: Pulmonary effort is normal.     Breath sounds: Normal breath sounds.  Abdominal:     General: Bowel sounds are normal.     Palpations: Abdomen is soft.  Musculoskeletal:        General: No tenderness or deformity. Normal range of motion.     Cervical back: Normal range of motion.    Lymphadenopathy:     Cervical: No cervical adenopathy.  Skin:    General: Skin is warm and dry.     Findings: No erythema or rash.  Neurological:     Mental Status: She is alert and oriented to person, place, and time.  Psychiatric:        Behavior: Behavior normal.        Thought Content: Thought content normal.  Judgment: Judgment normal.      Lab Results  Component Value Date   WBC 4.4 08/11/2019   HGB 12.0 08/11/2019   HCT 36.3 08/11/2019   MCV 87.3 08/11/2019   PLT 268 08/11/2019     Chemistry      Component Value Date/Time   NA 142 08/11/2019 1054   NA 142 02/15/2017 1518   NA 140 04/13/2016 1055   K 4.0 08/11/2019 1054   K 4.1 02/15/2017 1518   K 4.1 04/13/2016 1055   CL 105 08/11/2019 1054   CL 107 02/15/2017 1518   CO2 29 08/11/2019 1054   CO2 29 02/15/2017 1518   CO2 27 04/13/2016 1055   BUN 15 08/11/2019 1054   BUN 17 02/15/2017 1518   BUN 22.1 04/13/2016 1055   CREATININE 0.92 08/11/2019 1054   CREATININE 1.0 02/15/2017 1518   CREATININE 0.8 04/13/2016 1055      Component Value Date/Time   CALCIUM 9.8 08/11/2019 1054   CALCIUM 9.3 02/15/2017 1518   CALCIUM 9.9 04/13/2016 1055   ALKPHOS 62 08/11/2019 1054   ALKPHOS 79 02/15/2017 1518   ALKPHOS 81 04/13/2016 1055   AST 21 08/11/2019 1054   AST 20 04/13/2016 1055   ALT 25 08/11/2019 1054   ALT 24 02/15/2017 1518   ALT 22 04/13/2016 1055   BILITOT 0.4 08/11/2019 1054   BILITOT 0.44 04/13/2016 1055      Impression and Plan: Sherry Williams is a 59 year old Caucasian female.  She had a idiopathic pulmonary embolus/DVT back in 2018.  She was on Xarelto for 6 months.  She now has another thromboembolic episode.  This 1 appears to be in the brain and also a venous 1 in the leg.  I have to believe that there is some form of hypercoagulability causing all this.  I just do not know if we have the technology to uncover the problem right now.  I am a little bit worried regarding her use of  homeopathic agents.  However, she is very confident in using these.  I will know if neurology will go along with her being off Lipitor and taking something more "natural."  I told her that she is going to have to be on lifelong anticoagulation.  I do still think there is any way around this.  We will probably get a repeat her MRI in about 3 months.  I would also get a Doppler of her leg.  This might show a chronic thrombus.  I literally spent 45 minutes with her.  Her husband was on the cell phone so was able to answer any questions at he had.  She is quite nice.  I does feel bad that she has had all this.  We will have to see about making referral down to the hypercoagulable clinic down at [facility].  I would like to see her back in 6 weeks.  I would not plan for any scans on her probably until May.   [name], MD 3/1/20215:02 PM

## 2019-08-12 ENCOUNTER — Other Ambulatory Visit: Payer: Self-pay | Admitting: *Deleted

## 2019-08-12 DIAGNOSIS — I6309 Cerebral infarction due to thrombosis of other precerebral artery: Secondary | ICD-10-CM

## 2019-08-12 DIAGNOSIS — I2609 Other pulmonary embolism with acute cor pulmonale: Secondary | ICD-10-CM

## 2019-08-12 LAB — CARDIOLIPIN ANTIBODIES, IGG, IGM, IGA
Anticardiolipin IgA: <9 APL U/mL (ref 0–11)
Anticardiolipin IgG: <9 GPL U/mL (ref 0–14)
Anticardiolipin IgM: 12 MPL U/mL (ref 0–12)

## 2019-08-12 LAB — HOMOCYSTEINE: Homocysteine: 12.5 umol/L (ref 0.0–14.5)

## 2019-08-12 NOTE — Progress Notes (Signed)
Ambulatory referral to [facility] for hypercoag clinic placed per order of Dr. Marin Olp.

## 2019-08-13 ENCOUNTER — Telehealth: Payer: Self-pay | Admitting: Hematology & Oncology

## 2019-08-13 NOTE — Telephone Encounter (Signed)
Appointments scheduled calendar/letter mailed  

## 2019-08-15 LAB — FACTOR 8 ASSAY: Coagulation Factor VIII: 93 % (ref 56–140)

## 2019-08-16 LAB — PTT-LA MIX: PTT-LA Mix: 56.7 s — ABNORMAL HIGH (ref 0.0–48.9)

## 2019-08-16 LAB — LUPUS ANTICOAGULANT PANEL
DRVVT: 119.5 s — ABNORMAL HIGH (ref 0.0–47.0)
PTT Lupus Anticoagulant: 61.1 s — ABNORMAL HIGH (ref 0.0–51.9)

## 2019-08-16 LAB — DRVVT MIX: dRVVT Mix: 89.7 s — ABNORMAL HIGH (ref 0.0–40.4)

## 2019-08-16 LAB — DRVVT CONFIRM: dRVVT Confirm: 1 ratio (ref 0.8–1.2)

## 2019-08-16 LAB — HEXAGONAL PHASE PHOSPHOLIPID: Hexagonal Phase Phospholipid: 0 s (ref 0–11)

## 2019-08-18 ENCOUNTER — Encounter: Payer: Self-pay | Admitting: *Deleted

## 2019-08-19 NOTE — ED Provider Notes (Signed)
Addendum to ED note on 07/29/19  CRITICAL CARE Performed by: Malvin Johns Total critical care time: 45 minutes Critical care time was exclusive of separately billable procedures and treating other patients. Critical care was necessary to treat or prevent imminent or life-threatening deterioration. Critical care was time spent personally by me on the following activities: development of treatment plan with patient and/or surrogate as well as nursing, discussions with consultants, evaluation of patient's response to treatment, examination of patient, obtaining history from patient or surrogate, ordering and performing treatments and interventions, ordering and review of laboratory studies, ordering and review of radiographic studies, pulse oximetry and re-evaluation of patient's condition.    Malvin Johns, MD 08/19/19 [phone]

## 2019-08-25 ENCOUNTER — Encounter: Payer: Self-pay | Admitting: Hematology & Oncology

## 2019-09-01 ENCOUNTER — Other Ambulatory Visit: Payer: Self-pay | Admitting: *Deleted

## 2019-09-01 ENCOUNTER — Encounter: Payer: Self-pay | Admitting: Hematology & Oncology

## 2019-09-01 DIAGNOSIS — I6300 Cerebral infarction due to thrombosis of unspecified precerebral artery: Secondary | ICD-10-CM

## 2019-09-01 MED ORDER — DABIGATRAN ETEXILATE MESYLATE 150 MG PO CAPS: 150.0000 mg | ORAL_CAPSULE | Freq: Two times a day (BID) | ORAL | 0 refills | Status: DC

## 2019-09-11 ENCOUNTER — Telehealth: Payer: Self-pay | Admitting: *Deleted

## 2019-09-11 NOTE — Telephone Encounter (Signed)
Returned patient's phone call regarding issues. I reviewed labs for Monday, and made Dr. Marin Olp aware that she had a left blurry eye yesterday, but has no problems today. She verbalized understanding.

## 2019-09-15 ENCOUNTER — Inpatient Hospital Stay: Payer: 59 | Attending: Hematology & Oncology

## 2019-09-15 ENCOUNTER — Inpatient Hospital Stay (HOSPITAL_BASED_OUTPATIENT_CLINIC_OR_DEPARTMENT_OTHER): Payer: 59 | Admitting: Hematology & Oncology

## 2019-09-15 ENCOUNTER — Other Ambulatory Visit: Payer: Self-pay | Admitting: *Deleted

## 2019-09-15 ENCOUNTER — Other Ambulatory Visit: Payer: Self-pay

## 2019-09-15 VITALS — BP 117/73 | HR 83 | Temp 97.1°F | Resp 17 | Wt 143.5 lb

## 2019-09-15 DIAGNOSIS — R300 Dysuria: Secondary | ICD-10-CM

## 2019-09-15 DIAGNOSIS — E063 Autoimmune thyroiditis: Secondary | ICD-10-CM

## 2019-09-15 DIAGNOSIS — H538 Other visual disturbances: Secondary | ICD-10-CM | POA: Insufficient documentation

## 2019-09-15 DIAGNOSIS — I63019 Cerebral infarction due to thrombosis of unspecified vertebral artery: Secondary | ICD-10-CM | POA: Diagnosis not present

## 2019-09-15 DIAGNOSIS — Z8673 Personal history of transient ischemic attack (TIA), and cerebral infarction without residual deficits: Secondary | ICD-10-CM | POA: Diagnosis not present

## 2019-09-15 DIAGNOSIS — R7989 Other specified abnormal findings of blood chemistry: Secondary | ICD-10-CM | POA: Insufficient documentation

## 2019-09-15 DIAGNOSIS — I82401 Acute embolism and thrombosis of unspecified deep veins of right lower extremity: Secondary | ICD-10-CM

## 2019-09-15 DIAGNOSIS — R5383 Other fatigue: Secondary | ICD-10-CM | POA: Diagnosis not present

## 2019-09-15 DIAGNOSIS — C541 Malignant neoplasm of endometrium: Secondary | ICD-10-CM | POA: Insufficient documentation

## 2019-09-15 DIAGNOSIS — Z90710 Acquired absence of both cervix and uterus: Secondary | ICD-10-CM | POA: Insufficient documentation

## 2019-09-15 DIAGNOSIS — I2609 Other pulmonary embolism with acute cor pulmonale: Secondary | ICD-10-CM

## 2019-09-15 DIAGNOSIS — Z7901 Long term (current) use of anticoagulants: Secondary | ICD-10-CM | POA: Insufficient documentation

## 2019-09-15 DIAGNOSIS — I82431 Acute embolism and thrombosis of right popliteal vein: Secondary | ICD-10-CM | POA: Insufficient documentation

## 2019-09-15 DIAGNOSIS — I6309 Cerebral infarction due to thrombosis of other precerebral artery: Secondary | ICD-10-CM

## 2019-09-15 DIAGNOSIS — R590 Localized enlarged lymph nodes: Secondary | ICD-10-CM | POA: Insufficient documentation

## 2019-09-15 DIAGNOSIS — Z90722 Acquired absence of ovaries, bilateral: Secondary | ICD-10-CM | POA: Insufficient documentation

## 2019-09-15 DIAGNOSIS — I8002 Phlebitis and thrombophlebitis of superficial vessels of left lower extremity: Secondary | ICD-10-CM | POA: Diagnosis not present

## 2019-09-15 DIAGNOSIS — R109 Unspecified abdominal pain: Secondary | ICD-10-CM | POA: Diagnosis not present

## 2019-09-15 DIAGNOSIS — I6300 Cerebral infarction due to thrombosis of unspecified precerebral artery: Secondary | ICD-10-CM

## 2019-09-15 LAB — URINALYSIS, COMPLETE (UACMP) WITH MICROSCOPIC
Bilirubin Urine: NEGATIVE
Glucose, UA: NEGATIVE mg/dL
Ketones, ur: NEGATIVE mg/dL
Leukocytes,Ua: NEGATIVE
Nitrite: NEGATIVE
Protein, ur: NEGATIVE mg/dL
Specific Gravity, Urine: <1.005 — ABNORMAL LOW (ref 1.005–1.030)
pH: 6.5 (ref 5.0–8.0)

## 2019-09-15 LAB — CBC WITH DIFFERENTIAL (CANCER CENTER ONLY)
Abs Immature Granulocytes: 0.01 10*3/uL (ref 0.00–0.07)
Basophils Absolute: 0 10*3/uL (ref 0.0–0.1)
Basophils Relative: 1 %
Eosinophils Absolute: 0.1 10*3/uL (ref 0.0–0.5)
Eosinophils Relative: 1 %
HCT: 36.1 % (ref 36.0–46.0)
Hemoglobin: 11.8 g/dL — ABNORMAL LOW (ref 12.0–15.0)
Immature Granulocytes: 0 %
Lymphocytes Relative: 35 %
Lymphs Abs: 1.3 10*3/uL (ref 0.7–4.0)
MCH: 28.7 pg (ref 26.0–34.0)
MCHC: 32.7 g/dL (ref 30.0–36.0)
MCV: 87.8 fL (ref 80.0–100.0)
Monocytes Absolute: 0.4 10*3/uL (ref 0.1–1.0)
Monocytes Relative: 11 %
Neutro Abs: 1.9 10*3/uL (ref 1.7–7.7)
Neutrophils Relative %: 52 %
Platelet Count: 236 10*3/uL (ref 150–400)
RBC: 4.11 MIL/uL (ref 3.87–5.11)
RDW: 12.8 % (ref 11.5–15.5)
WBC Count: 3.7 10*3/uL — ABNORMAL LOW (ref 4.0–10.5)
nRBC: 0 % (ref 0.0–0.2)

## 2019-09-15 LAB — CMP (CANCER CENTER ONLY)
ALT: 10 U/L (ref 0–44)
AST: 16 U/L (ref 15–41)
Albumin: 4.3 g/dL (ref 3.5–5.0)
Alkaline Phosphatase: 64 U/L (ref 38–126)
Anion gap: 8 (ref 5–15)
BUN: 13 mg/dL (ref 6–20)
CO2: 26 mmol/L (ref 22–32)
Calcium: 9.5 mg/dL (ref 8.9–10.3)
Chloride: 105 mmol/L (ref 98–111)
Creatinine: 0.8 mg/dL (ref 0.44–1.00)
GFR, Est AFR Am: >60 mL/min (ref >60)
GFR, Estimated: >60 mL/min (ref >60)
Glucose, Bld: 99 mg/dL (ref 70–99)
Potassium: 4 mmol/L (ref 3.5–5.1)
Sodium: 139 mmol/L (ref 135–145)
Total Bilirubin: 0.4 mg/dL (ref 0.3–1.2)
Total Protein: 6.7 g/dL (ref 6.5–8.1)

## 2019-09-15 LAB — D-DIMER, QUANTITATIVE: D-Dimer, Quant: 1.03 ug/mL-FEU — ABNORMAL HIGH (ref 0.00–0.50)

## 2019-09-15 MED ORDER — DABIGATRAN ETEXILATE MESYLATE 150 MG PO CAPS: 150.0000 mg | ORAL_CAPSULE | Freq: Two times a day (BID) | ORAL | 0 refills | Status: DC

## 2019-09-16 ENCOUNTER — Telehealth: Payer: Self-pay | Admitting: Hematology & Oncology

## 2019-09-16 ENCOUNTER — Other Ambulatory Visit: Payer: Self-pay | Admitting: *Deleted

## 2019-09-16 ENCOUNTER — Encounter: Payer: Self-pay | Admitting: *Deleted

## 2019-09-16 LAB — CARDIOLIPIN ANTIBODIES, IGG, IGM, IGA
Anticardiolipin IgA: <9 APL U/mL (ref 0–11)
Anticardiolipin IgG: <9 GPL U/mL (ref 0–14)
Anticardiolipin IgM: <9 MPL U/mL (ref 0–12)

## 2019-09-16 NOTE — Telephone Encounter (Signed)
Called and spoke with patient regarding appointments added per 4/5 los.  She will be getting phone call from [facility] to schedule [place] & for 4/7 and then 5/4 for MR

## 2019-09-16 NOTE — Progress Notes (Addendum)
Hematology and Oncology Follow Up Visit  Sherry Williams [id number] [birth year] 59 y.o. 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 twice daily-started on 08/01/2019     Interim History:  Sherry Williams is back for a long awaited visit.  She is doing pretty well.  She does have occasional blurred vision.  I just think that this is probably going to be a occasional event for her.  She is doing the Pradaxa.  She is tolerating this pretty well right now.  She had another hypercoagulable evaluation.  So far, we have not found any obvious etiology for this recurrent thromboembolic event.  She has not seen neurology for follow-up.  She will not take the statin agent.  There has been no problems with leg pain or swelling.  She is back to walking more now.  She has more energy.  Her lab studies look pretty good today.  Her white cell count is 3.7.  Hemoglobin 11.8 and platelet count 236,000.  We are rechecking a lupus anticoagulant on her.  Were also going to check a factor IX level.  She has a mildly elevated D-dimer today.  In light of this, I think it would not be a bad idea to check a Doppler of her legs just to make sure there is nothing going on.  She is complaining of some left flank pain.  We did do a urinalysis on her.  There is no blood in the urinalysis.  There is no obvious infection with the urinalysis.  There is no chest wall pain.  She has had no cough.  She has had no obvious bleeding.  There is no nausea or vomiting.  She did have a nice Easter weekend.  Overall, I would say her performance status is probably ECOG 0.     Medications:  Current Outpatient Medications:  .  ARMOUR THYROID 60 MG tablet, TAKE 1 TABLET BY MOUTH EVERY DAY BEFORE BREAKFAST. TAKE 1 DAILY 5 DAYS PER WEEK (Patient taking differently: Take 60 mg by mouth See admin instructions.  Taking 60mg  5 days a week), Disp: 90 tablet, Rfl: 2 .  ARMOUR THYROID 90 MG tablet, TAKE 1 TABLET BY MOUTH EVERY DAY TWO DAYS PER WEEK (Patient taking differently: Take 90 mg by mouth See admin instructions. Taking 90mg  2 days a week  ( could not provide schedule)), Disp: 90 tablet, Rfl: 1 .  dabigatran (PRADAXA) 150 MG CAPS capsule, Take 1 capsule (150 mg total) by mouth every 12 (twelve) hours., Disp: 180 capsule, Rfl: 0  Allergies:  Allergies  Allergen Reactions  . Bee Venom Anaphylaxis  . Adhesive [Tape] Other (See Comments)    Irritation and red    Past Medical History, Surgical history, Social history, and Family History were reviewed and updated.  Review of Systems: Review of Systems  Constitutional: Positive for fatigue.  HENT:  Negative.   Eyes: Negative.   Respiratory: Negative.   Cardiovascular: Negative.   Gastrointestinal: Negative.   Endocrine: Negative.   Genitourinary: Negative.    Musculoskeletal: Negative.   Skin: Negative.   Neurological: Negative.   Hematological: Negative.   Psychiatric/Behavioral: Negative.     Physical Exam:  weight is 143 lb 8 oz (65.1 kg). Her temporal temperature is 97.1 F (36.2 C) (abnormal). Her blood pressure is 117/73 and her pulse is 83. Her respiration is 17 and oxygen saturation is  98%.   Wt Readings from Last 3 Encounters:  09/15/19 143 lb 8 oz (65.1 kg)  08/11/19 145 lb (65.8 kg)  07/31/19 144 lb (65.3 kg)    Physical Exam Vitals reviewed.  HENT:     Head: Normocephalic and atraumatic.  Eyes:     Pupils: Pupils are equal, round, and reactive to light.  Cardiovascular:     Rate and Rhythm: Normal rate and regular rhythm.     Heart sounds: Normal heart sounds.  Pulmonary:     Effort: Pulmonary effort is normal.     Breath sounds: Normal breath sounds.  Abdominal:     General: Bowel sounds are normal.     Palpations: Abdomen is soft.  Musculoskeletal:        General: No tenderness or deformity. Normal range of  motion.     Cervical back: Normal range of motion.  Lymphadenopathy:     Cervical: No cervical adenopathy.  Skin:    General: Skin is warm and dry.     Findings: No erythema or rash.  Neurological:     Mental Status: She is alert and oriented to person, place, and time.  Psychiatric:        Behavior: Behavior normal.        Thought Content: Thought content normal.        Judgment: Judgment normal.      Lab Results  Component Value Date   WBC 3.7 (L) 09/15/2019   HGB 11.8 (L) 09/15/2019   HCT 36.1 09/15/2019   MCV 87.8 09/15/2019   PLT 236 09/15/2019     Chemistry      Component Value Date/Time   NA 139 09/15/2019 1522   NA 142 02/15/2017 1518   NA 140 04/13/2016 1055   K 4.0 09/15/2019 1522   K 4.1 02/15/2017 1518   K 4.1 04/13/2016 1055   CL 105 09/15/2019 1522   CL 107 02/15/2017 1518   CO2 26 09/15/2019 1522   CO2 29 02/15/2017 1518   CO2 27 04/13/2016 1055   BUN 13 09/15/2019 1522   BUN 17 02/15/2017 1518   BUN 22.1 04/13/2016 1055   CREATININE 0.80 09/15/2019 1522   CREATININE 1.0 02/15/2017 1518   CREATININE 0.8 04/13/2016 1055      Component Value Date/Time   CALCIUM 9.5 09/15/2019 1522   CALCIUM 9.3 02/15/2017 1518   CALCIUM 9.9 04/13/2016 1055   ALKPHOS 64 09/15/2019 1522   ALKPHOS 79 02/15/2017 1518   ALKPHOS 81 04/13/2016 1055   AST 16 09/15/2019 1522   AST 20 04/13/2016 1055   ALT 10 09/15/2019 1522   ALT 24 02/15/2017 1518   ALT 22 04/13/2016 1055   BILITOT 0.4 09/15/2019 1522   BILITOT 0.44 04/13/2016 1055      Impression and Plan: Sherry Williams is a 59 year old Caucasian female.  She had a idiopathic pulmonary embolus/DVT back in 2018.  She was on Xarelto for 6 months.  She now has another thromboembolic episode.  This 1 appears to be in the brain and also a venous 1 in the leg.  I have to believe that there is some form of hypercoagulability causing all this.  I just do not know if we have the technology to uncover the problem right  now.  We will check a Doppler of her legs.  I does want to make sure that with the elevated D-dimer that were not overlooking an issue down there.  I also will follow up with an  MRI of her brain.  We will get this in a month.  I want to see how everything looks.  I suspect she probably will have some chronic type changes.  I know them she will see Dr. Odis Hollingshead down at [facility].  He is the coagulation expert.  Maybe, they may have some technology that we just do not have access to that might help determine why she has had the stroke and the recurrent DVT in the leg.  She was on Xarelto.  I just glad that she is more active now.  She is very busy at work.  She has a very active lifestyle.  I am just glad that she is able to get back to that level of functioning.  We will plan to see her back in another 4 weeks or so.     [name], MD 4/6/20216:50 AM   ADDENDUM: When I got her labs back, I saw that her D-dimer was slightly elevated.  We did get a Doppler of her legs.  This was done on 09/17/2019.  Unfortunately, the Doppler shows she now has bilateral lower extremity DVTs.  She has DVTs in the right leg and the right popliteal vein and right peroneal vein.  In the left leg, there is a thrombus in the posterior tibial vein.  She has some superficial thrombophlebitis in the right lesser saphenous vein in the left soleal vein.  I am really troubled because she is on Pradaxa.  I have to believe that there is some resistance that she has with respect to these new oral anticoagulants.  I think that the options now are Coumadin or subcutaneous Lovenox.  All of our hypercoagulable studies have been negative.  I actually checked a factor IX level on her which was normal.  I take another lupus anticoagulant level and this also was negative.  I talked to her on the phone.  I told her what was going on.  She really has had no symptoms.  When I saw her, her legs were not swollen.  She was having  no pain.  I just worry that she might have propagation of these thrombi that might potentially lead to a pulmonary embolism.  Again I gave her the options of Coumadin versus Lovenox.  I think that Coumadin probably would be acceptable although she would have to modify her diet.  She does take homeopathic agents which might interfere with Coumadin or vice versa.  Lovenox probably would be more effective.  However, this would be daily subcutaneous injections.  She will get back with me when she decides on how to proceed.  This is truly very complicated.  I am not sure as to why she would have a resistance to the new oral anticoagulants.  We still have to get her down to [facility] to see the coagulation experts down there.  Maybe they might be able to help [place] out.  Lattie Haw, MD

## 2019-09-17 ENCOUNTER — Encounter (HOSPITAL_BASED_OUTPATIENT_CLINIC_OR_DEPARTMENT_OTHER): Payer: Self-pay

## 2019-09-17 ENCOUNTER — Encounter: Payer: Self-pay | Admitting: Hematology & Oncology

## 2019-09-17 ENCOUNTER — Other Ambulatory Visit: Payer: Self-pay

## 2019-09-17 ENCOUNTER — Ambulatory Visit (HOSPITAL_BASED_OUTPATIENT_CLINIC_OR_DEPARTMENT_OTHER)
Admission: RE | Admit: 2019-09-17 | Discharge: 2019-09-17 | Disposition: A | Discharge status: Home / Self Care | Payer: 59 | Source: Ambulatory Visit | Attending: Hematology & Oncology | Admitting: Hematology & Oncology

## 2019-09-17 DIAGNOSIS — I63019 Cerebral infarction due to thrombosis of unspecified vertebral artery: Secondary | ICD-10-CM | POA: Diagnosis present

## 2019-09-17 LAB — DRVVT CONFIRM: dRVVT Confirm: 1 ratio (ref 0.8–1.2)

## 2019-09-17 LAB — FACTOR 9 ASSAY: Coagulation Factor IX: 104 % (ref 60–177)

## 2019-09-17 LAB — LUPUS ANTICOAGULANT PANEL
DRVVT: 68.3 s — ABNORMAL HIGH (ref 0.0–47.0)
PTT Lupus Anticoagulant: 43.6 s (ref 0.0–51.9)

## 2019-09-17 LAB — DRVVT MIX: dRVVT Mix: 56.9 s — ABNORMAL HIGH (ref 0.0–40.4)

## 2019-09-18 ENCOUNTER — Other Ambulatory Visit: Payer: Self-pay | Admitting: Hematology & Oncology

## 2019-09-18 ENCOUNTER — Other Ambulatory Visit: Payer: Self-pay | Admitting: *Deleted

## 2019-09-18 ENCOUNTER — Encounter: Payer: Self-pay | Admitting: Hematology & Oncology

## 2019-09-18 MED ORDER — ENOXAPARIN SODIUM 100 MG/ML ~~LOC~~ SOLN: 100.0000 mg | SUBCUTANEOUS | 6 refills | Status: DC

## 2019-09-21 ENCOUNTER — Other Ambulatory Visit: Payer: Self-pay

## 2019-09-21 ENCOUNTER — Ambulatory Visit (HOSPITAL_BASED_OUTPATIENT_CLINIC_OR_DEPARTMENT_OTHER)
Admission: RE | Admit: 2019-09-21 | Discharge: 2019-09-21 | Disposition: A | Discharge status: Home / Self Care | Payer: 59 | Source: Ambulatory Visit | Attending: Hematology & Oncology | Admitting: Hematology & Oncology

## 2019-09-21 DIAGNOSIS — I63019 Cerebral infarction due to thrombosis of unspecified vertebral artery: Secondary | ICD-10-CM

## 2019-09-21 MED ORDER — GADOBUTROL 1 MMOL/ML IV SOLN
6.0000 mL | Freq: Once | INTRAVENOUS | Status: AC | PRN
  Administered 2019-09-21: 6 mL via INTRAVENOUS

## 2019-09-22 ENCOUNTER — Encounter: Payer: Self-pay | Admitting: *Deleted

## 2019-09-25 ENCOUNTER — Other Ambulatory Visit: Payer: Self-pay

## 2019-09-25 ENCOUNTER — Encounter (HOSPITAL_COMMUNITY): Payer: Self-pay

## 2019-09-25 ENCOUNTER — Emergency Department (HOSPITAL_COMMUNITY): Payer: 59

## 2019-09-25 DIAGNOSIS — E039 Hypothyroidism, unspecified: Secondary | ICD-10-CM | POA: Insufficient documentation

## 2019-09-25 DIAGNOSIS — Z86718 Personal history of other venous thrombosis and embolism: Secondary | ICD-10-CM | POA: Diagnosis not present

## 2019-09-25 DIAGNOSIS — Z7901 Long term (current) use of anticoagulants: Secondary | ICD-10-CM | POA: Insufficient documentation

## 2019-09-25 DIAGNOSIS — Z79899 Other long term (current) drug therapy: Secondary | ICD-10-CM | POA: Insufficient documentation

## 2019-09-25 DIAGNOSIS — G459 Transient cerebral ischemic attack, unspecified: Secondary | ICD-10-CM | POA: Insufficient documentation

## 2019-09-25 DIAGNOSIS — R531 Weakness: Secondary | ICD-10-CM | POA: Diagnosis present

## 2019-09-25 LAB — CBC
HCT: 42 % (ref 36.0–46.0)
Hemoglobin: 13.4 g/dL (ref 12.0–15.0)
MCH: 29.5 pg (ref 26.0–34.0)
MCHC: 31.9 g/dL (ref 30.0–36.0)
MCV: 92.3 fL (ref 80.0–100.0)
Platelets: 283 10*3/uL (ref 150–400)
RBC: 4.55 MIL/uL (ref 3.87–5.11)
RDW: 13 % (ref 11.5–15.5)
WBC: 4.4 10*3/uL (ref 4.0–10.5)
nRBC: 0 % (ref 0.0–0.2)

## 2019-09-25 LAB — BASIC METABOLIC PANEL
Anion gap: 9 (ref 5–15)
BUN: 12 mg/dL (ref 6–20)
CO2: 25 mmol/L (ref 22–32)
Calcium: 9.3 mg/dL (ref 8.9–10.3)
Chloride: 108 mmol/L (ref 98–111)
Creatinine, Ser: 0.65 mg/dL (ref 0.44–1.00)
GFR calc Af Amer: >60 mL/min (ref >60)
GFR calc non Af Amer: >60 mL/min (ref >60)
Glucose, Bld: 70 mg/dL (ref 70–99)
Potassium: 3.7 mmol/L (ref 3.5–5.1)
Sodium: 142 mmol/L (ref 135–145)

## 2019-09-25 LAB — I-STAT BETA HCG BLOOD, ED (NOT ORDERABLE): I-stat hCG, quantitative: 5.5 m[IU]/mL — ABNORMAL HIGH (ref <5)

## 2019-09-25 LAB — TROPONIN I (HIGH SENSITIVITY): Troponin I (High Sensitivity): 6 ng/L (ref <18)

## 2019-09-25 MED ORDER — SODIUM CHLORIDE 0.9% FLUSH: 3.0000 mL | Freq: Once | INTRAVENOUS | Status: DC

## 2019-09-25 NOTE — ED Triage Notes (Addendum)
Pt sts arm numbness/ weakness starting at 2045 tonight. Lasted for 10 minutes. Pt sts she feels back to normal. Came here anyways due to history of blood clots and stroke. Being treated with lovenox for dvt's.

## 2019-09-26 ENCOUNTER — Emergency Department (HOSPITAL_COMMUNITY)
Admission: EM | Admit: 2019-09-26 | Discharge: 2019-09-26 | Disposition: A | Discharge status: Home / Self Care | Payer: 59 | Attending: Emergency Medicine | Admitting: Emergency Medicine

## 2019-09-26 ENCOUNTER — Emergency Department (HOSPITAL_COMMUNITY): Payer: 59

## 2019-09-26 ENCOUNTER — Encounter: Payer: Self-pay | Admitting: Family Medicine

## 2019-09-26 ENCOUNTER — Encounter: Payer: Self-pay | Admitting: Hematology & Oncology

## 2019-09-26 DIAGNOSIS — Z7901 Long term (current) use of anticoagulants: Secondary | ICD-10-CM

## 2019-09-26 DIAGNOSIS — G459 Transient cerebral ischemic attack, unspecified: Secondary | ICD-10-CM

## 2019-09-26 HISTORY — DX: Cerebral infarction, unspecified: I63.9

## 2019-09-26 LAB — TROPONIN I (HIGH SENSITIVITY): Troponin I (High Sensitivity): 6 ng/L (ref <18)

## 2019-09-26 NOTE — ED Provider Notes (Signed)
[facility] DEPT Provider Note   CSN: [id number] Arrival date & time: 09/25/19  2114   History Chief Complaint  Patient presents with  . Extremity Weakness    Sherry Williams is a 59 y.o. female.  The history is provided by the patient.  Extremity Weakness  She has history pulmonary embolism, stroke, DVT and is currently anticoagulated on enoxaparin and comes in because of an episode of right arm heaviness.  This happened at about 8 PM and lasted about 10 minutes.  She was able to move the arm.  She was sitting at the time of does not know if she had any leg heaviness or weakness.  She did not have any headache or difficulty speaking.  She is now completely back to normal.   Past Medical History:  Diagnosis Date  . Allergy   . DVT (deep venous thrombosis) ([place])    lower extremity   . Endometrial ca ([place]) 03/14/2017  . Family history of adverse reaction to anesthesia    children had N/V   . Hypothyroidism    Hashimoto  . Lichen sclerosus   . MRSA (methicillin resistant staph aureus) culture positive   . Pulmonary embolism ([place]) 01/13/2016   After car trip  . Stroke [facility])     Patient Active Problem List   Diagnosis Date Noted  . CVA (cerebral vascular accident) ([place]) 07/29/2019  . DVT (deep venous thrombosis) ([place])   . Nephrolithiasis 05/17/2018  . Endometrial cancer ([place]) 04/17/2017  . MRSA (methicillin resistant staph aureus) culture positive 03/14/2017  . Lichen sclerosus et atrophicus 03/14/2017  . Pulmonary embolus ([place]) 08/14/2016  . 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 BUBBLE STUDY  07/31/2019   Procedure: BUBBLE STUDY;  Surgeon: [name], MD;  Location: [facility];  Service: Cardiovascular;;  . CYSTOSCOPY WITH RETROGRADE PYELOGRAM, URETEROSCOPY AND STENT PLACEMENT Left 05/18/2018   Procedure: CYSTOSCOPY WITH RETROGRADE PYELOGRAM, LEFT URETEROSCOPY  AND LEFT URETERAL STENT PLACEMENT;  Surgeon: [name], MD;  [facility];  Service: Urology;  Laterality: Left;  . CYSTOSCOPY WITH RETROGRADE PYELOGRAM, URETEROSCOPY AND STENT PLACEMENT Right 05/30/2018   Procedure: CYSTOSCOPY WITH RIGHT RETROGRADE PYELOGRAM, URETEROSCOPY LASER LITHOTRIPSY AND STENT PLACEMENT;  Surgeon: [name], MD;  [facility]  Service: Urology;  Laterality: Right;  . HOLMIUM LASER APPLICATION Left [id number]   Procedure: HOLMIUM LASER APPLICATION;  Surgeon: [name], MD;  [facility];  Service: Urology;  Laterality: Left;  . HOLMIUM LASER APPLICATION Right [id number]   Procedure: HOLMIUM LASER APPLICATION;  Surgeon: [name], MD;  Location: [facility];  Service: Urology;  Laterality: Right;  . MOUTH SURGERY    . ROBOTIC ASSISTED TOTAL HYSTERECTOMY WITH BILATERAL SALPINGO OOPHERECTOMY Bilateral 04/17/2017   Procedure: XI ROBOTIC ASSISTED TOTAL HYSTERECTOMY WITH BILATERAL SALPINGO OOPHORECTOMY WITH SENTINAL LYMPH NODE BIOPSY AND POSSIBLE LYMPHADENECTOMY;  Surgeon: [name], MD;  [facility];  Service: Gynecology;  Laterality: Bilateral;  . TEE WITHOUT CARDIOVERSION N/A 07/31/2019   Procedure: TRANSESOPHAGEAL ECHOCARDIOGRAM (TEE);  Surgeon: [name], MD;  Location: [facility] ENDOSCOPY;  Service: Cardiovascular;  Laterality: N/A;     OB History   No obstetric history on file.     Family History  Problem Relation Age of Onset  . Stroke Father   . Alzheimer's disease Father   . Hyperlipidemia Father   . Deep vein thrombosis Sister   .  Breast cancer Paternal Grandmother        In her 61s, but lived to be very elderly  . Arthritis Mother   . Lung cancer Maternal Grandmother   . Diabetes Paternal Grandfather   . Pancreatic cancer Paternal Grandfather     Social History   Tobacco Use  . Smoking status: Never Smoker  . Smokeless tobacco: Never Used  Substance Use  Topics  . Alcohol use: Yes    Alcohol/week: 1.0 standard drinks    Types: 1 Glasses of wine per week    Comment: occassional  . Drug use: No    Home Medications Prior to Admission medications   Medication Sig Start Date End Date Taking? Authorizing Provider  ARMOUR THYROID 60 MG tablet TAKE 1 TABLET BY MOUTH EVERY DAY BEFORE BREAKFAST. TAKE 1 DAILY 5 DAYS PER WEEK Patient taking differently: Take 60 mg by mouth See admin instructions. Taking 60mg  5 days a week 11/05/18   Eulas Post, MD  ARMOUR THYROID 90 MG tablet TAKE 1 TABLET BY MOUTH EVERY DAY TWO DAYS PER WEEK Patient taking differently: Take 90 mg by mouth See admin instructions. Taking 90mg  2 days a week  ( could not provide schedule) 05/05/19   Burchette, Alinda Sierras, MD  enoxaparin (LOVENOX) 100 MG/ML injection Inject 1 mL (100 mg total) into the skin daily. 09/18/19   [name], MD    Allergies    Bee venom and Adhesive [tape]  Review of Systems   Review of Systems  Musculoskeletal: Positive for extremity weakness.  All other systems reviewed and are negative.   Physical Exam Updated Vital Signs BP 109/68 (BP Location: Right Arm)   Pulse (!) 51   Temp (!) 97.4 F (36.3 C) (Oral)   Resp 16   Ht 5\' 6"  (1.676 m)   Wt 63.5 kg   LMP 09/04/2012   SpO2 99%   BMI 22.60 kg/m   Physical Exam Vitals and nursing note reviewed.   59 year old female, resting comfortably and in no acute distress. Vital signs are significant for slow heart rate. Oxygen saturation is 99%, which is normal. Head is normocephalic and atraumatic. PERRLA, EOMI. Oropharynx is clear. Neck is nontender and supple without adenopathy or JVD.  There are no carotid bruits. Back is nontender and there is no CVA tenderness. Lungs are clear without rales, wheezes, or rhonchi. Chest is nontender. Heart has regular rate and rhythm without murmur. Abdomen is soft, flat, nontender without masses or hepatosplenomegaly and peristalsis is  normoactive. Extremities have no cyanosis or edema, full range of motion is present. Skin is warm and dry without rash. Neurologic: Mental status is normal, cranial nerves are intact.  Motor strength is 5/5 in all muscle groups.  There is no pronator drift.  Sensation is normal and there is no extinction on double simultaneous stimulation.  ED Results / Procedures / Treatments   Labs (all labs ordered are listed, but only abnormal results are displayed) Labs Reviewed  I-STAT BETA HCG BLOOD, ED (NOT ORDERABLE) - Abnormal; Notable for the following components:      Result Value   I-stat hCG, quantitative 5.5 (*)    All other components within normal limits  BASIC METABOLIC PANEL  CBC  I-STAT BETA HCG BLOOD, ED ([facility], [facility], [facility] ONLY)  TROPONIN I (HIGH SENSITIVITY)  TROPONIN I (HIGH SENSITIVITY)    EKG EKG Interpretation  Date/Time:  Thursday September 25 2019 21:43:50 EDT Ventricular Rate:  56 PR Interval:  QRS Duration: 92 QT Interval:  430 QTC Calculation: 415 R Axis:   86 Text Interpretation: Sinus rhythm Short PR interval Borderline right axis deviation 12 Lead; [place] When compared with ECG of 07/29/2019, No significant change was found Confirmed by Delora Fuel ([id number]) on 09/26/2019 2:42:04 AM   Radiology DG Chest 2 View  Result Date: 09/25/2019 CLINICAL DATA:  Chest pain for several hours EXAM: CHEST - 2 VIEW COMPARISON:  07/29/2019 CT FINDINGS: The heart size and mediastinal contours are within normal limits. Both lungs are clear. The visualized skeletal structures are unremarkable. IMPRESSION: No active cardiopulmonary disease. Electronically Signed   By: Inez Catalina M.D.   On: 09/25/2019 22:14   CT Head Wo Contrast  Result Date: 09/26/2019 CLINICAL DATA:  Numbness and weakness EXAM: CT HEAD WITHOUT CONTRAST TECHNIQUE: Contiguous axial images were obtained from the base of the skull through the vertex without intravenous contrast. COMPARISON:  None. FINDINGS: Brain: There  is no mass, hemorrhage or extra-axial collection. The size and configuration of the ventricles and extra-axial CSF spaces are normal. The brain parenchyma is normal, without acute or chronic infarction. Vascular: No abnormal hyperdensity of the major intracranial arteries or dural venous sinuses. No intracranial atherosclerosis. Skull: The visualized skull base, calvarium and extracranial soft tissues are normal. Sinuses/Orbits: No fluid levels or advanced mucosal thickening of the visualized paranasal sinuses. No mastoid or middle ear effusion. The orbits are normal. IMPRESSION: Normal head CT. Electronically Signed   By: Ulyses Jarred M.D.   On: 09/26/2019 03:32    Procedures Procedures  Medications Ordered in ED Medications - No data to display  ED Course  I have reviewed the triage vital signs and the nursing notes.  Pertinent labs & imaging results that were available during my care of the patient were reviewed by me and considered in my medical decision making (see chart for details).  MDM Rules/Calculators/A&P Transient ischemic attack.  Old records are reviewed, and she had extensive work-up following her stroke in February.  No indication for additional testing, but will check CT of head to make sure she has not had a bleed.  Labs today are normal except for minimal elevation of hCG which is felt to be a lab error.  CT of head is unremarkable.  It is noted that she is not on any antiplatelet agents.  She is advised to start low-dose aspirin, follow-up with her physicians who are evaluating her hypercoagulable state.  Final Clinical Impression(s) / ED Diagnoses Final diagnoses:  Transient ischemic attack  Chronic anticoagulation    Rx / DC Orders ED Discharge Orders    None       Delora Fuel, [place] [id number] [phone]

## 2019-09-26 NOTE — ED Notes (Signed)
Pt not in lobby for lab draw of 2nd Troponin

## 2019-09-26 NOTE — Discharge Instructions (Addendum)
Start taking Aspirin 81mg every day.    

## 2019-09-26 NOTE — ED Notes (Signed)
Pt not in lobby at this time for recheck vitals

## 2019-09-28 ENCOUNTER — Other Ambulatory Visit: Payer: Self-pay | Admitting: Hematology & Oncology

## 2019-09-28 ENCOUNTER — Encounter: Payer: Self-pay | Admitting: Family Medicine

## 2019-09-28 DIAGNOSIS — I6300 Cerebral infarction due to thrombosis of unspecified precerebral artery: Secondary | ICD-10-CM

## 2019-09-30 ENCOUNTER — Telehealth (INDEPENDENT_AMBULATORY_CARE_PROVIDER_SITE_OTHER): Payer: 59 | Admitting: Family Medicine

## 2019-09-30 ENCOUNTER — Ambulatory Visit (HOSPITAL_COMMUNITY): Payer: 59

## 2019-09-30 ENCOUNTER — Other Ambulatory Visit: Payer: Self-pay | Admitting: Hematology & Oncology

## 2019-09-30 DIAGNOSIS — Z8673 Personal history of transient ischemic attack (TIA), and cerebral infarction without residual deficits: Secondary | ICD-10-CM

## 2019-09-30 DIAGNOSIS — Z8542 Personal history of malignant neoplasm of other parts of uterus: Secondary | ICD-10-CM | POA: Diagnosis not present

## 2019-09-30 DIAGNOSIS — R768 Other specified abnormal immunological findings in serum: Secondary | ICD-10-CM | POA: Diagnosis not present

## 2019-09-30 DIAGNOSIS — C541 Malignant neoplasm of endometrium: Secondary | ICD-10-CM

## 2019-09-30 DIAGNOSIS — E063 Autoimmune thyroiditis: Secondary | ICD-10-CM

## 2019-09-30 DIAGNOSIS — Z86718 Personal history of other venous thrombosis and embolism: Secondary | ICD-10-CM

## 2019-09-30 MED ORDER — FONDAPARINUX SODIUM 7.5 MG/0.6ML ~~LOC~~ SOLN: 7.5000 mg | SUBCUTANEOUS | 3 refills | Status: DC

## 2019-09-30 NOTE — Progress Notes (Signed)
Patient ID: Sherry Williams, female   DOB: 06-18-1960, 59 y.o.   MRN: [id number]  This visit type was conducted due to national recommendations for restrictions regarding the COVID-19 pandemic in an effort to limit this patient's exposure and mitigate transmission in our community.   Virtual Visit via Video Note  I connected with Sherry Williams on 09/30/19 at 11:00 AM EDT by a video enabled telemedicine application and verified that I am speaking with the correct person using two identifiers.  Location patient: home Location provider:work or home office Persons participating in the virtual visit: patient, provider  I discussed the limitations of evaluation and management by telemedicine and the availability of in person appointments. The patient expressed understanding and agreed to proceed.   HPI: We set up this virtual visit to discuss Sherry Williams's recent visit with hematology specialist at [facility].  History of multiple clotting episodes dating back to August 2017.  She had bilateral distal DVTs and pulmonary emboli.  She was on Xarelto until March 2018.  She then had second episode of venous clot documented September 2020.  In late September she had some right upper extremity swelling and Doppler revealed no deep vein thrombosis but she has some acute superficial vein thrombosis. By December she developed some calf pain and had evidence for DVT right lower extremity and Xarelto started at that point. By February she developed some neurologic symptoms then ended up getting MRI after some visual symptoms and this showed numerous small acute to subacute infarcts bilateral parietal cortex. MRA of the neck and brain were normal. Patient was admitted at that point. She was sent home on Pradaxa and Xarelto was discontinued..  By March 2021 she had third neurologic event with some blurred vision and finally had repeat Doppler studies in April which again showed DVT right and left lower  extremities.  Notes from hematologist at [facility] reviewed. He made several suggestions including  -aggressive workup for occult malignancy. -Consideration for rheumatology referral with her prior positive ANA 1:320 -4-week cardiac event monitor -Abdominal and pelvic CT scans -Consideration for PET scan if any abnormalities with the above -Consideration for referral to neurologist [facility] (presumably in process) -Changing her enoxaparin to fondaparinux 7.5 mg daily -Consideration for referral to GI for EGD and colonoscopy   ROS: See pertinent positives and negatives per HPI.  Past Medical History:  Diagnosis Date  . Allergy   . DVT (deep venous thrombosis) ([place])    lower extremity   . Endometrial ca ([place]) 03/14/2017  . Family history of adverse reaction to anesthesia    children had N/V   . Hypothyroidism    Hashimoto  . Lichen sclerosus   . MRSA (methicillin resistant staph aureus) culture positive   . Pulmonary embolism ([place]) 01/13/2016   After car trip  . Stroke [facility])     Past Surgical History:  Procedure Laterality Date  . BUBBLE STUDY  07/31/2019   Procedure: BUBBLE STUDY;  Surgeon: [name], MD;  Location: [facility];  Service: Cardiovascular;;  . CYSTOSCOPY WITH RETROGRADE PYELOGRAM, URETEROSCOPY AND STENT PLACEMENT Left 05/18/2018   Procedure: CYSTOSCOPY WITH RETROGRADE PYELOGRAM, LEFT URETEROSCOPY AND LEFT URETERAL STENT PLACEMENT;  Surgeon: [name], MD;  [facility];  Service: Urology;  Laterality: Left;  . CYSTOSCOPY WITH RETROGRADE PYELOGRAM, URETEROSCOPY AND STENT PLACEMENT Right 05/30/2018   Procedure: CYSTOSCOPY WITH RIGHT RETROGRADE PYELOGRAM, URETEROSCOPY LASER LITHOTRIPSY AND STENT PLACEMENT;  Surgeon: [name], MD;  Location: [facility];  Service: Urology;  Laterality: Right;  . HOLMIUM LASER APPLICATION Left [id number]   Procedure: HOLMIUM LASER APPLICATION;  Surgeon: [name], MD;   [facility];  Service: Urology;  Laterality: Left;  . HOLMIUM LASER APPLICATION Right [id number]   Procedure: HOLMIUM LASER APPLICATION;  Surgeon: [name], MD;  Location: [facility];  Service: Urology;  Laterality: Right;  . MOUTH SURGERY    . ROBOTIC ASSISTED TOTAL HYSTERECTOMY WITH BILATERAL SALPINGO OOPHERECTOMY Bilateral 04/17/2017   Procedure: XI ROBOTIC ASSISTED TOTAL HYSTERECTOMY WITH BILATERAL SALPINGO OOPHORECTOMY WITH SENTINAL LYMPH NODE BIOPSY AND POSSIBLE LYMPHADENECTOMY;  Surgeon: [name], MD;  [facility];  Service: Gynecology;  Laterality: Bilateral;  . TEE WITHOUT CARDIOVERSION N/A 07/31/2019   Procedure: TRANSESOPHAGEAL ECHOCARDIOGRAM (TEE);  Surgeon: [name], MD;  Location[facility] ENDOSCOPY;  Service: Cardiovascular;  Laterality: N/A;    Family History  Problem Relation Age of Onset  . Stroke Father   . Alzheimer's disease Father   . Hyperlipidemia Father   . Deep vein thrombosis Sister   . Breast cancer Paternal Grandmother        In her 36s, but lived to be very elderly  . Arthritis Mother   . Lung cancer Maternal Grandmother   . Diabetes Paternal Grandfather   . Pancreatic cancer Paternal Grandfather     SOCIAL HX: Non-smoker   Current Outpatient Medications:  .  ARMOUR THYROID 60 MG tablet, TAKE 1 TABLET BY MOUTH EVERY DAY BEFORE BREAKFAST. TAKE 1 DAILY 5 DAYS PER WEEK (Patient taking differently: Take 60 mg by mouth See admin instructions. Taking 60mg  5 days a week), Disp: 90 tablet, Rfl: 2 .  ARMOUR THYROID 90 MG tablet, TAKE 1 TABLET BY MOUTH EVERY DAY TWO DAYS PER WEEK (Patient taking differently: Take 90 mg by mouth See admin instructions. Taking 90mg  2 days a week  ( could not provide schedule)), Disp: 90 tablet, Rfl: 1 .  enoxaparin (LOVENOX) 100 MG/ML injection, Inject 1 mL (100 mg total) into the skin daily., Disp: 30 mL, Rfl: 6  EXAM:  VITALS per patient if applicable:  GENERAL: alert, oriented,  appears well and in no acute distress  HEENT: atraumatic, conjunttiva clear, no obvious abnormalities on inspection of external nose and ears  NECK: normal movements of the head and neck  LUNGS: on inspection no signs of respiratory distress, breathing rate appears normal, no obvious gross SOB, gasping or wheezing  CV: no obvious cyanosis  MS: moves all visible extremities without noticeable abnormality  PSYCH/NEURO: pleasant and cooperative, no obvious depression or anxiety, speech and thought processing grossly intact  ASSESSMENT AND PLAN:  Discussed the following assessment and plan:  #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  -We will set up rheumatology referral  -We will set up cardiac event monitor     I discussed the assessment and treatment plan with the patient. The patient was provided an opportunity to ask questions and all were answered. The patient agreed with the plan and demonstrated an understanding of the instructions.   The patient was advised to call back or seek an in-person evaluation if the  symptoms worsen or if the condition fails to improve as anticipated.   Carolann Littler, MD

## 2019-10-01 ENCOUNTER — Encounter: Payer: Self-pay | Admitting: *Deleted

## 2019-10-01 NOTE — Progress Notes (Signed)
Patient ID: Sherry Williams, female   DOB: Dec 29, 1960, 59 y.o.   MRN: [id number] Patient enrolled for [employer] to ship a 30 day cardiac event monitor to her home.  Instructions sent to patient via My Chart Message and will also be included in her monitor kit.

## 2019-10-02 ENCOUNTER — Encounter: Payer: Self-pay | Admitting: Hematology & Oncology

## 2019-10-02 ENCOUNTER — Other Ambulatory Visit: Payer: Self-pay | Admitting: *Deleted

## 2019-10-03 ENCOUNTER — Other Ambulatory Visit: Payer: Self-pay

## 2019-10-03 ENCOUNTER — Encounter (HOSPITAL_BASED_OUTPATIENT_CLINIC_OR_DEPARTMENT_OTHER): Payer: Self-pay

## 2019-10-03 ENCOUNTER — Ambulatory Visit (HOSPITAL_BASED_OUTPATIENT_CLINIC_OR_DEPARTMENT_OTHER)
Admission: RE | Admit: 2019-10-03 | Discharge: 2019-10-03 | Disposition: A | Discharge status: Home / Self Care | Payer: 59 | Source: Ambulatory Visit | Attending: Hematology & Oncology | Admitting: Hematology & Oncology

## 2019-10-03 DIAGNOSIS — C541 Malignant neoplasm of endometrium: Secondary | ICD-10-CM | POA: Insufficient documentation

## 2019-10-03 MED ORDER — IOHEXOL 300 MG/ML  SOLN
100.0000 mL | Freq: Once | INTRAMUSCULAR | Status: AC | PRN
  Administered 2019-10-03: 100 mL via INTRAVENOUS

## 2019-10-04 ENCOUNTER — Ambulatory Visit (INDEPENDENT_AMBULATORY_CARE_PROVIDER_SITE_OTHER): Payer: 59

## 2019-10-04 DIAGNOSIS — I639 Cerebral infarction, unspecified: Secondary | ICD-10-CM | POA: Diagnosis not present

## 2019-10-04 DIAGNOSIS — I4891 Unspecified atrial fibrillation: Secondary | ICD-10-CM | POA: Diagnosis not present

## 2019-10-04 DIAGNOSIS — Z8673 Personal history of transient ischemic attack (TIA), and cerebral infarction without residual deficits: Secondary | ICD-10-CM

## 2019-10-05 ENCOUNTER — Encounter: Payer: Self-pay | Admitting: Hematology & Oncology

## 2019-10-06 ENCOUNTER — Encounter: Payer: Self-pay | Admitting: Nurse Practitioner

## 2019-10-06 ENCOUNTER — Encounter: Payer: Self-pay | Admitting: Hematology & Oncology

## 2019-10-06 ENCOUNTER — Other Ambulatory Visit: Payer: Self-pay | Admitting: Hematology & Oncology

## 2019-10-06 DIAGNOSIS — I82401 Acute embolism and thrombosis of unspecified deep veins of right lower extremity: Secondary | ICD-10-CM

## 2019-10-06 NOTE — Progress Notes (Signed)
Gynecologic Oncology Return Clinic Visit  10/07/19  Reason for Visit: follow-up recent CT imaging in the setting of a history of low-risk endometrial cancer  Treatment History: Oncology History  Endometrial cancer ([place])  04/17/2017 Initial Diagnosis   Endometrial cancer ([place])   04/17/2017 Surgery   TRH/BSO, SLN bx   04/17/2017 Pathologic Stage   IA grade 1 EMCA Negative SLNs, negative LVSI     Interval History: Patient presents today to discuss recent findings of new abdominopelvic adenopathy on CT scan performed in the setting of recurrent and unexplained VTE.  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 She was placed on Xarelto for 6 months (until 08/2016) at which time a follow-up ultrasound showed clost resolution. Hematologic work-up at that time was negative. In 02/2019, she was diagnosed with SVTs of the upper extremity. In 03/2019, she was diagnosed with a left LE SVT. Due to right calf pain, she underwent ultrasound in 05/2019 showing bilateral DVTs. She was started on Xarelto and very quickly became symptomatic (malaise, chest tightness, anxiety, fatigue, dizziness). Since February, she has had 3 neurologic events (deatiled in Dr. Lorita Officer note in Care Every in 09/2019), including an admission to [facility] in 07/2019 where MRI of the head showed numerous small acute/early subacute infarcts. MRA of the neck and brain were normal. Ultrasound of legs was normal, no DVT. TEE was negative. Given stroke occurred on Xarelto, the patient was discharged home on Pradaxa. In early April, due to leg soreness, she underwent leg dopplers showing an occlusive R DVT and SVT. She was transitioned from Pradaxa to lovenox. Ultimately, after seeing Dr. Joan Flores, she was transitioned to fondaparinux. She was also referred for CT, EGD/colonoscopy. A thrombophilia  work-up was negative.   She describes weeks of intermittent L flank/abdominal dull aching as well as lower back pain and abdominal bloating. She also continues to have mild intermittent ongoing headaches. She endorses continued fatigue. Denies any vaginal bleeding, discharge or pelvic pain.   She is currently wearing a cardiac event monitor for 30 days given above detailed history.  Past Medical/Surgical History: Past Medical History:  Diagnosis Date  . Allergy   . DVT (deep venous thrombosis) ([place])    lower extremity   . Endometrial ca ([place]) 03/14/2017  . Family history of adverse reaction to anesthesia    children had N/V   . Hypothyroidism    Hashimoto  . Lichen sclerosus   . MRSA (methicillin resistant staph aureus) culture positive   . Pulmonary embolism ([place]) 01/13/2016   After car trip  . Stroke [facility])     Past Surgical History:  Procedure Laterality Date  . BUBBLE STUDY  07/31/2019   Procedure: BUBBLE STUDY;  Surgeon: [name], MD;  Location: [place];  Service: Cardiovascular;;  . CYSTOSCOPY WITH RETROGRADE PYELOGRAM, URETEROSCOPY AND STENT PLACEMENT Left 05/18/2018   Procedure: CYSTOSCOPY WITH RETROGRADE PYELOGRAM, LEFT URETEROSCOPY AND LEFT URETERAL STENT PLACEMENT;  Surgeon: [name], MD;  [facility];  Service: Urology;  Laterality: Left;  . CYSTOSCOPY WITH RETROGRADE PYELOGRAM, URETEROSCOPY AND STENT PLACEMENT Right 05/30/2018   Procedure: CYSTOSCOPY WITH RIGHT RETROGRADE PYELOGRAM, URETEROSCOPY LASER LITHOTRIPSY AND STENT PLACEMENT;  Surgeon: [name], MD;  Location: [facility];  Service: Urology;  Laterality: Right;  . HOLMIUM LASER APPLICATION Left [id number]   Procedure: HOLMIUM LASER APPLICATION;  Surgeon: [name]  W, MD;  [facility];  Service: Urology;  Laterality: Left;  . HOLMIUM LASER APPLICATION Right [id number]   Procedure: HOLMIUM LASER APPLICATION;  Surgeon: [name], MD;   Location: [facility];  Service: Urology;  Laterality: Right;  . MOUTH SURGERY    . ROBOTIC ASSISTED TOTAL HYSTERECTOMY WITH BILATERAL SALPINGO OOPHERECTOMY Bilateral 04/17/2017   Procedure: XI ROBOTIC ASSISTED TOTAL HYSTERECTOMY WITH BILATERAL SALPINGO OOPHORECTOMY WITH SENTINAL LYMPH NODE BIOPSY AND POSSIBLE LYMPHADENECTOMY;  Surgeon: [name], MD;  [facility];  Service: Gynecology;  Laterality: Bilateral;  . TEE WITHOUT CARDIOVERSION N/A 07/31/2019   Procedure: TRANSESOPHAGEAL ECHOCARDIOGRAM (TEE);  Surgeon: [name], MD;  Location: [facility] END[facility]COPY;  Service: Cardiovascular;  Laterality: N/A;    Family History  Problem Relation Age of Onset  . Stroke Father   . Alzheimer's disease Father   . Hyperlipidemia Father   . Deep vein thrombosis Sister   . Breast cancer Paternal Grandmother        In her 27s, but lived to be very elderly  . Arthritis Mother   . Lung cancer Maternal Grandmother   . Diabetes Paternal Grandfather   . Pancreatic cancer Paternal Grandfather     Social History   Socioeconomic History  . Marital status: Married    Spouse name: Not on file  . Number of children: Not on file  . Years of education: Not on file  . Highest education level: Not on file  Occupational History  . Not on file  Tobacco Use  . Smoking status: Never Smoker  . Smokeless tobacco: Never Used  Substance and Sexual Activity  . Alcohol use: Yes    Alcohol/week: 1.0 standard drinks    Types: 1 Glasses of wine per week    Comment: occassional  . Drug use: No  . Sexual activity: Yes  Other Topics Concern  . Not on file  Social History Narrative  . Not on file   Social Determinants of Health   Financial Resource Strain:   . Difficulty of Paying Living Expenses:   Food Insecurity:   . Worried About Charity fundraiser in the Last Year:   . Arboriculturist in the Last Year:   Transportation Needs:   . Film/video editor (Medical):   Marland Kitchen Lack of  Transportation (Non-Medical):   Physical Activity:   . Days of Exercise per Week:   . Minutes of Exercise per Session:   Stress:   . Feeling of Stress :   Social Connections:   . Frequency of Communication with Friends and Family:   . Frequency of Social Gatherings with Friends and Family:   . Attends Religious Services:   . Active Member of Clubs or Organizations:   . Attends Archivist Meetings:   Marland Kitchen Marital Status:     Current Medications:  Current Outpatient Medications:  .  ARMOUR THYROID 60 MG tablet, TAKE 1 TABLET BY MOUTH EVERY DAY BEFORE BREAKFAST. TAKE 1 DAILY 5 DAYS PER WEEK (Patient taking differently: Take 60 mg by mouth See admin instructions. Taking 60mg  5 days a week), Disp: 90 tablet, Rfl: 2 .  ARMOUR THYROID 90 MG tablet, TAKE 1 TABLET BY MOUTH EVERY DAY TWO DAYS PER WEEK (Patient taking differently: Take 90 mg by mouth See admin instructions. Taking 90mg  2 days a week  ( could not provide schedule)), Disp: 90 tablet, Rfl: 1 .  ASPIRIN 81 PO, Take by mouth daily., Disp: , Rfl:  .  fondaparinux (ARIXTRA) 7.5 MG/0.6ML SOLN injection, Inject 0.6 mLs (7.5 mg total) into the skin daily., Disp: 18 mL, Rfl: 3  Review of Systems: Pertinent positive as per HPI Denies appetite changes, fevers, chills, unexplained weight changes. Denies hearing loss, neck lumps or masses, mouth sores, ringing in ears or voice changes. Denies cough or wheezing.  Denies shortness of breath. Denies chest pain or palpitations. Denies leg swelling. Denies abdominal distention, blood in stools, constipation, diarrhea, nausea, vomiting, or early satiety. Denies pain with intercourse, dysuria, frequency, hematuria or incontinence. Denies hot flashes, pelvic pain, vaginal bleeding or vaginal discharge.   Denies joint pain, back pain or muscle pain/cramps. Denies itching, rash, or wounds. Denies numbness or seizures. Denies swollen lymph nodes or glands, denies easy bruising or  bleeding. Denies anxiety, depression, confusion, or decreased concentration.  Physical Exam: BP 139/70 (BP Location: Right Arm, Patient Position: Sitting)   Pulse 69   Temp 99.1 F (37.3 C) (Temporal)   Resp 16   Ht 5\' 6"  (1.676 m)   Wt 140 lb 2 oz (63.6 kg)   LMP 09/04/2012   SpO2 100%   BMI 22.62 kg/m  General: Alert, oriented, no acute distress. HEENT: Normocephalic, atraumatic, sclera anicteric. Chest: Clear to auscultation bilaterally. No wheezes or rales. Cardiovascular: Regular rate and rhythm, no murmurs. Abdomen: soft, nontender.  Normoactive bowel sounds.  No masses or hepatosplenomegaly appreciated.  Well-healed lsc incisions. Extremities: Grossly normal range of motion.  Warm, well perfused.  No edema bilaterally. Skin: o rashes or lesions noted. Lymphatics: No cervical, supraclavicular, or inguinal adenopathy. GU: Normal appearing external genitalia without erythema, excoriation, or lesions.  Speculum exam reveals mildly atrophic vaginal mucosa, cuff intact, no lesions or masses.  Bimanual exam reveals cuff intact.  Rectovaginal exam confirms no nodularity.  Laboratory & Radiologic Studies: CT C/A/P on 4/23: 1. Enlargement of abdominopelvic nodes, highly suspicious for metastatic disease from the patient's endometrial carcinoma. Lymphoma could look similar but is felt less likely. 2.  No acute process or evidence of metastatic disease in the chest. 3.  Aortic Atherosclerosis (ICD10-I70.0).  CT head on 4/16: No acute abnormalities  Assessment & Plan: Sherry Williams is a 59 y.o. woman with early-stage low risk endometrial cancer s/p surgery in 2018 who presents for follow-up given recent abdominopelvic adenopathy on CT scan performed for work-up of multiple VTE events in the last 9 months despite being on anticoagulation.  Reviewed CT findings which are concerning for disease recurrence but somewhat atypical given her low-risk disease. Additionally, given  distribution among lymph nodes with no other abdominopelvic evidence of disease, I think further evaluation to r/o other cause for imaging findings is warranted. Given there small size (all lymph nodes at most 1 cm), I don't think that percutaneous biopsy is feasible. Thus, I recommend proceeding with exploratory surgery with LN biopsy versus lymph node dissection. I think it is worth evaluating with a PET scan pre-op to assure no other evidence concerning for possible metastatic disease if this is recurrent cancer vs. New primary. Will plan for robotic lymph node dissection and any other indicated procedures on 5/11.  The risks of surgery were discussed in detail and she understands these to include infection; wound separation; hernia; injury to adjacent organs such as bowel, bladder, blood vessels, ureters and nerves; bleeding which may require blood transfusion; anesthesia risk; thromboembolic events; possible death; unforeseen complications; possible need for re-exploration; medical complications such as heart attack, stroke, pleural effusion and pneumonia; and, if full lymphadenectomy is performed  the risk of lymphedema and lymphocyst. The patient will receive DVT and antibiotic prophylaxis as indicated. She voiced a clear understanding. She had the opportunity to ask questions. Perioperative instructions were reviewed with her.   We will reach out to Dr. Joan Flores, her hematologist, for recommendations regarding her anticoagulation peri-op.   45 minutes of total time was spent for this patient encounter, including preparation, face-to-face counseling with the patient and coordination of care, and documentation of the encounter.  Jeral Pinch, MD  Division of Gynecologic Oncology  Department of Obstetrics and Gynecology  [facility]

## 2019-10-06 NOTE — H&P (View-Only) (Signed)
Gynecologic Oncology Return Clinic Visit  10/07/19  Reason for Visit: follow-up recent CT imaging in the setting of a history of low-risk endometrial cancer  Treatment History: Oncology History  Endometrial cancer ([place])  04/17/2017 Initial Diagnosis   Endometrial cancer ([name])   04/17/2017 Surgery   TRH/BSO, SLN bx   04/17/2017 Pathologic Stage   IA grade 1 EMCA Negative SLNs, negative LVSI     Interval History: Patient presents today to discuss recent findings of new abdominopelvic adenopathy on CT scan performed in the setting of recurrent and unexplained VTE.  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 She was placed on Xarelto for 6 months (until 08/2016) at which time a follow-up ultrasound showed clost resolution. Hematologic work-up at that time was negative. In 02/2019, she was diagnosed with SVTs of the upper extremity. In 03/2019, she was diagnosed with a left LE SVT. Due to right calf pain, she underwent ultrasound in 05/2019 showing bilateral DVTs. She was started on Xarelto and very quickly became symptomatic (malaise, chest tightness, anxiety, fatigue, dizziness). Since February, she has had 3 neurologic events (deatiled in Dr. Lorita Officer note in Care Every in 09/2019), including an admission to [facility] in 07/2019 where MRI of the head showed numerous small acute/early subacute infarcts. MRA of the neck and brain were normal. Ultrasound of legs was normal, no DVT. TEE was negative. Given stroke occurred on Xarelto, the patient was discharged home on Pradaxa. In early April, due to leg soreness, she underwent leg dopplers showing an occlusive R DVT and SVT. She was transitioned from Pradaxa to lovenox. Ultimately, after seeing Dr. Joan Flores, she was transitioned to fondaparinux. She was also referred for CT, EGD/colonoscopy. A thrombophilia  work-up was negative.   She describes weeks of intermittent L flank/abdominal dull aching as well as lower back pain and abdominal bloating. She also continues to have mild intermittent ongoing headaches. She endorses continued fatigue. Denies any vaginal bleeding, discharge or pelvic pain.   She is currently wearing a cardiac event monitor for 30 days given above detailed history.  Past Medical/Surgical History: Past Medical History:  Diagnosis Date  . Allergy   . DVT (deep venous thrombosis) ([place])    lower extremity   . Endometrial ca ([place]) 03/14/2017  . Family history of adverse reaction to anesthesia    children had N/V   . Hypothyroidism    Hashimoto  . Lichen sclerosus   . MRSA (methicillin resistant staph aureus) culture positive   . Pulmonary embolism ([place]) 01/13/2016   After car trip  . Stroke [facility])     Past Surgical History:  Procedure Laterality Date  . BUBBLE STUDY  07/31/2019   Procedure: BUBBLE STUDY;  Surgeon: [name], MD;  Location: [place];  Service: Cardiovascular;;  . CYSTOSCOPY WITH RETROGRADE PYELOGRAM, URETEROSCOPY AND STENT PLACEMENT Left 05/18/2018   Procedure: CYSTOSCOPY WITH RETROGRADE PYELOGRAM, LEFT URETEROSCOPY AND LEFT URETERAL STENT PLACEMENT;  Surgeon: [name], MD;  [facility];  Service: Urology;  Laterality: Left;  . CYSTOSCOPY WITH RETROGRADE PYELOGRAM, URETEROSCOPY AND STENT PLACEMENT Right 05/30/2018   Procedure: CYSTOSCOPY WITH RIGHT RETROGRADE PYELOGRAM, URETEROSCOPY LASER LITHOTRIPSY AND STENT PLACEMENT;  Surgeon: [name], MD;  Location: [facility];  Service: Urology;  Laterality: Right;  . HOLMIUM LASER APPLICATION Left [id number]   Procedure: HOLMIUM LASER APPLICATION;  Surgeon: [name]  W, MD;  [facility];  Service: Urology;  Laterality: Left;  . HOLMIUM LASER APPLICATION Right [id number]   Procedure: HOLMIUM LASER APPLICATION;  Surgeon: [name], MD;   Location: [facility];  Service: Urology;  Laterality: Right;  . MOUTH SURGERY    . ROBOTIC ASSISTED TOTAL HYSTERECTOMY WITH BILATERAL SALPINGO OOPHERECTOMY Bilateral 04/17/2017   Procedure: XI ROBOTIC ASSISTED TOTAL HYSTERECTOMY WITH BILATERAL SALPINGO OOPHORECTOMY WITH SENTINAL LYMPH NODE BIOPSY AND POSSIBLE LYMPHADENECTOMY;  Surgeon: [name], MD;  [facility];  Service: Gynecology;  Laterality: Bilateral;  . TEE WITHOUT CARDIOVERSION N/A 07/31/2019   Procedure: TRANSESOPHAGEAL ECHOCARDIOGRAM (TEE);  Surgeon: [name], MD;  Location: [facility] ENDOSCOPY;  Service: Cardiovascular;  Laterality: N/A;    Family History  Problem Relation Age of Onset  . Stroke Father   . Alzheimer's disease Father   . Hyperlipidemia Father   . Deep vein thrombosis Sister   . Breast cancer Paternal Grandmother        In her 30s, but lived to be very elderly  . Arthritis Mother   . Lung cancer Maternal Grandmother   . Diabetes Paternal Grandfather   . Pancreatic cancer Paternal Grandfather     Social History   Socioeconomic History  . Marital status: Married    Spouse name: Not on file  . Number of children: Not on file  . Years of education: Not on file  . Highest education level: Not on file  Occupational History  . Not on file  Tobacco Use  . Smoking status: Never Smoker  . Smokeless tobacco: Never Used  Substance and Sexual Activity  . Alcohol use: Yes    Alcohol/week: 1.0 standard drinks    Types: 1 Glasses of wine per week    Comment: occassional  . Drug use: No  . Sexual activity: Yes  Other Topics Concern  . Not on file  Social History Narrative  . Not on file   Social Determinants of Health   Financial Resource Strain:   . Difficulty of Paying Living Expenses:   Food Insecurity:   . Worried About Charity fundraiser in the Last Year:   . Arboriculturist in the Last Year:   Transportation Needs:   . Film/video editor (Medical):   Marland Kitchen Lack of  Transportation (Non-Medical):   Physical Activity:   . Days of Exercise per Week:   . Minutes of Exercise per Session:   Stress:   . Feeling of Stress :   Social Connections:   . Frequency of Communication with Friends and Family:   . Frequency of Social Gatherings with Friends and Family:   . Attends Religious Services:   . Active Member of Clubs or Organizations:   . Attends Archivist Meetings:   Marland Kitchen Marital Status:     Current Medications:  Current Outpatient Medications:  .  ARMOUR THYROID 60 MG tablet, TAKE 1 TABLET BY MOUTH EVERY DAY BEFORE BREAKFAST. TAKE 1 DAILY 5 DAYS PER WEEK (Patient taking differently: Take 60 mg by mouth See admin instructions. Taking 60mg  5 days a week), Disp: 90 tablet, Rfl: 2 .  ARMOUR THYROID 90 MG tablet, TAKE 1 TABLET BY MOUTH EVERY DAY TWO DAYS PER WEEK (Patient taking differently: Take 90 mg by mouth See admin instructions. Taking 90mg  2 days a week  ( could not provide schedule)), Disp: 90 tablet, Rfl: 1 .  ASPIRIN 81 PO, Take by mouth daily., Disp: , Rfl:  .  fondaparinux (ARIXTRA) 7.5 MG/0.6ML SOLN injection, Inject 0.6 mLs (7.5 mg total) into the skin daily., Disp: 18 mL, Rfl: 3  Review of Systems: Pertinent positive as per HPI Denies appetite changes, fevers, chills, unexplained weight changes. Denies hearing loss, neck lumps or masses, mouth sores, ringing in ears or voice changes. Denies cough or wheezing.  Denies shortness of breath. Denies chest pain or palpitations. Denies leg swelling. Denies abdominal distention, blood in stools, constipation, diarrhea, nausea, vomiting, or early satiety. Denies pain with intercourse, dysuria, frequency, hematuria or incontinence. Denies hot flashes, pelvic pain, vaginal bleeding or vaginal discharge.   Denies joint pain, back pain or muscle pain/cramps. Denies itching, rash, or wounds. Denies numbness or seizures. Denies swollen lymph nodes or glands, denies easy bruising or  bleeding. Denies anxiety, depression, confusion, or decreased concentration.  Physical Exam: BP 139/70 (BP Location: Right Arm, Patient Position: Sitting)   Pulse 69   Temp 99.1 F (37.3 C) (Temporal)   Resp 16   Ht 5\' 6"  (1.676 m)   Wt 140 lb 2 oz (63.6 kg)   LMP 09/04/2012   SpO2 100%   BMI 22.62 kg/m  General: Alert, oriented, no acute distress. HEENT: Normocephalic, atraumatic, sclera anicteric. Chest: Clear to auscultation bilaterally. No wheezes or rales. Cardiovascular: Regular rate and rhythm, no murmurs. Abdomen: soft, nontender.  Normoactive bowel sounds.  No masses or hepatosplenomegaly appreciated.  Well-healed lsc incisions. Extremities: Grossly normal range of motion.  Warm, well perfused.  No edema bilaterally. Skin: o rashes or lesions noted. Lymphatics: No cervical, supraclavicular, or inguinal adenopathy. GU: Normal appearing external genitalia without erythema, excoriation, or lesions.  Speculum exam reveals mildly atrophic vaginal mucosa, cuff intact, no lesions or masses.  Bimanual exam reveals cuff intact.  Rectovaginal exam confirms no nodularity.  Laboratory & Radiologic Studies: CT C/A/P on 4/23: 1. Enlargement of abdominopelvic nodes, highly suspicious for metastatic disease from the patient's endometrial carcinoma. Lymphoma could look similar but is felt less likely. 2.  No acute process or evidence of metastatic disease in the chest. 3.  Aortic Atherosclerosis (ICD10-I70.0).  CT head on 4/16: No acute abnormalities  Assessment & Plan: Sherry Williams is a 59 y.o. woman with early-stage low risk endometrial cancer s/p surgery in 2018 who presents for follow-up given recent abdominopelvic adenopathy on CT scan performed for work-up of multiple VTE events in the last 9 months despite being on anticoagulation.  Reviewed CT findings which are concerning for disease recurrence but somewhat atypical given her low-risk disease. Additionally, given  distribution among lymph nodes with no other abdominopelvic evidence of disease, I think further evaluation to r/o other cause for imaging findings is warranted. Given there small size (all lymph nodes at most 1 cm), I don't think that percutaneous biopsy is feasible. Thus, I recommend proceeding with exploratory surgery with LN biopsy versus lymph node dissection. I think it is worth evaluating with a PET scan pre-op to assure no other evidence concerning for possible metastatic disease if this is recurrent cancer vs. New primary. Will plan for robotic lymph node dissection and any other indicated procedures on 5/11.  The risks of surgery were discussed in detail and she understands these to include infection; wound separation; hernia; injury to adjacent organs such as bowel, bladder, blood vessels, ureters and nerves; bleeding which may require blood transfusion; anesthesia risk; thromboembolic events; possible death; unforeseen complications; possible need for re-exploration; medical complications such as heart attack, stroke, pleural effusion and pneumonia; and, if full lymphadenectomy is performed  the risk of lymphedema and lymphocyst. The patient will receive DVT and antibiotic prophylaxis as indicated. She voiced a clear understanding. She had the opportunity to ask questions. Perioperative instructions were reviewed with her.   We will reach out to Dr. Joan Flores, her hematologist, for recommendations regarding her anticoagulation peri-op.   45 minutes of total time was spent for this patient encounter, including preparation, face-to-face counseling with the patient and coordination of care, and documentation of the encounter.  Jeral Pinch, MD  Division of Gynecologic Oncology  Department of Obstetrics and Gynecology  [facility]

## 2019-10-07 ENCOUNTER — Encounter: Payer: Self-pay | Admitting: Hematology & Oncology

## 2019-10-07 ENCOUNTER — Other Ambulatory Visit: Payer: Self-pay

## 2019-10-07 ENCOUNTER — Encounter: Payer: Self-pay | Admitting: Nurse Practitioner

## 2019-10-07 ENCOUNTER — Encounter: Payer: Self-pay | Admitting: Family Medicine

## 2019-10-07 ENCOUNTER — Inpatient Hospital Stay (HOSPITAL_BASED_OUTPATIENT_CLINIC_OR_DEPARTMENT_OTHER): Payer: 59 | Admitting: Gynecologic Oncology

## 2019-10-07 VITALS — BP 139/70 | HR 69 | Temp 99.1°F | Resp 16 | Ht 66.0 in | Wt 140.1 lb

## 2019-10-07 DIAGNOSIS — Z90722 Acquired absence of ovaries, bilateral: Secondary | ICD-10-CM

## 2019-10-07 DIAGNOSIS — Z90710 Acquired absence of both cervix and uterus: Secondary | ICD-10-CM

## 2019-10-07 DIAGNOSIS — C541 Malignant neoplasm of endometrium: Secondary | ICD-10-CM

## 2019-10-07 DIAGNOSIS — I82431 Acute embolism and thrombosis of right popliteal vein: Secondary | ICD-10-CM | POA: Diagnosis not present

## 2019-10-07 DIAGNOSIS — R590 Localized enlarged lymph nodes: Secondary | ICD-10-CM | POA: Diagnosis not present

## 2019-10-07 NOTE — Patient Instructions (Addendum)
Preparing for your Surgery  Plan for surgery on Oct 21, 2019 with Dr. Jeral Pinch at [place] will be scheduled for a robotic assisted laparoscopic lymph node dissection, possible laparotomy, tumor debulking.   Pre-operative Testing -You will receive a phone call from presurgical testing at [facility] to arrange for a pre-operative appointment over the phone, lab appointment, and COVID test. The COVID test normally happens 3 days prior to the surgery and they ask that you self quarantine after the test up until surgery to decrease chance of exposure.  -Bring your insurance card, copy of an advanced directive if applicable, medication list  -At that visit, you will be asked to sign a consent for a possible blood transfusion in case a transfusion becomes necessary during surgery.  The need for a blood transfusion is rare but having consent is a necessary part of your care.     -Continue taking baby aspirin and we will contact you with the recommendations in regards to stopping your Arixtra.  -Do not take supplements such as fish oil (omega 3), red yeast rice, turmeric before your surgery.   Day Before Surgery at [place] will be asked to take in a light diet the day before surgery.  Avoid carbonated beverages.  You will be advised you can have clear liquids after midnight and up until 3 hours before your surgery.    Eat a light diet the day before surgery.  Examples including soups, broths, toast, yogurt, mashed potatoes.  Things to avoid include carbonated beverages (fizzy beverages), raw fruits and raw vegetables, or beans.   If your bowels are filled with gas, your surgeon will have difficulty visualizing your pelvic organs which increases your surgical risks.  Your role in recovery Your role is to become active as soon as directed by your doctor, while still giving yourself time to heal.  Rest when you feel tired. You will be asked to do the following in  order to speed your recovery:  - Cough and breathe deeply. This helps to clear and expand your lungs and can prevent pneumonia after surgery.  - [place]. Do mild physical activity. Walking or moving your legs help your circulation and body functions return to normal. Do not try to get up or walk alone the first time after surgery.   -If you develop swelling on one leg or the other, pain in the back of your leg, redness/warmth in one of your legs, please call the office or go to the Emergency Room to have a doppler to rule out a blood clot. For shortness of breath, chest pain-seek care in the Emergency Room as soon as possible. - Actively manage your pain. Managing your pain lets you move in comfort. We will ask you to rate your pain on a scale of zero to 10. It is your responsibility to tell your doctor or nurse where and how much you hurt so your pain can be treated.  Special Considerations -If you are diabetic, you may be placed on insulin after surgery to have closer control over your blood sugars to promote healing and recovery.  This does not mean that you will be discharged on insulin.  If applicable, your oral antidiabetics will be resumed when you are tolerating a solid diet.  -Your final pathology results from surgery should be available around one week after surgery and the results will be relayed to you when available.  -Dr. Lahoma Crocker is  the surgeon that assists your GYN Oncologist with surgery.  If you end up staying the night, the next day after your surgery you will either see Dr. Denman George, Dr. Berline Lopes, or Dr. Lahoma Crocker.  -FMLA forms can be faxed to [phone] and please allow 5-7 business days for completion.  Pain Management After Surgery -You have been prescribed your pain medication and bowel regimen medications before surgery so that you can have these available when you are discharged from the hospital. The pain medication is for use ONLY  AFTER surgery and a new prescription will not be given.   -Make sure that you have Tylenol and Ibuprofen at home to use on a regular basis after surgery for pain control. We recommend alternating the medications every hour to six hours since they work differently and are processed in the body differently for pain relief.  -Review the attached handout on narcotic use and their risks and side effects.   Bowel Regimen -You have been prescribed Sennakot-S to take nightly to prevent constipation especially if you are taking the narcotic pain medication intermittently.  It is important to prevent constipation and drink adequate amounts of liquids. You can stop taking this medication when you are not taking pain medication and you are back on your normal bowel routine.  Risks of Surgery Risks of surgery are low but include bleeding, infection, damage to surrounding structures, re-operation, blood clots, and very rarely death.   Blood Transfusion Information (For the consent to be signed before surgery)  We will be checking your blood type before surgery so in case of emergencies, we will know what type of blood you would need.                                            WHAT IS A BLOOD TRANSFUSION?  A transfusion is the replacement of blood or some of its parts. Blood is made up of multiple cells which provide different functions.  Red blood cells carry oxygen and are used for blood loss replacement.  White blood cells fight against infection.  Platelets control bleeding.  Plasma helps clot blood.  Other blood products are available for specialized needs, such as hemophilia or other clotting disorders. BEFORE THE TRANSFUSION  Who gives blood for transfusions?   You may be able to donate blood to be used at a later date on yourself (autologous donation).  Relatives can be asked to donate blood. This is generally not any safer than if you have received blood from a stranger. The same  precautions are taken to ensure safety when a relative's blood is donated.  Healthy volunteers who are fully evaluated to make sure their blood is safe. This is blood bank blood. Transfusion therapy is the safest it has ever been in the practice of medicine. Before blood is taken from a donor, a complete history is taken to make sure that person has no history of diseases nor engages in risky social behavior (examples are intravenous drug use or sexual activity with multiple partners). The donor's travel history is screened to minimize risk of transmitting infections, such as malaria. The donated blood is tested for signs of infectious diseases, such as HIV and hepatitis. The blood is then tested to be sure it is compatible with you in order to minimize the chance of a transfusion reaction. If you or a relative donates blood,  this is often done in anticipation of surgery and is not appropriate for emergency situations. It takes many days to process the donated blood. RISKS AND COMPLICATIONS Although transfusion therapy is very safe and saves many lives, the main dangers of transfusion include:   Getting an infectious disease.  Developing a transfusion reaction. This is an allergic reaction to something in the blood you were given. Every precaution is taken to prevent this. The decision to have a blood transfusion has been considered carefully by your caregiver before blood is given. Blood is not given unless the benefits outweigh the risks.  AFTER SURGERY INSTRUCTIONS  Return to work: 4-6 weeks if applicable  Activity: 1. Be up and out of the bed during the day.  Take a nap if needed.  You may walk up steps but be careful and use the hand rail.  Stair climbing will tire you more than you think, you may need to stop part way and rest.   2. No lifting or straining for 6 weeks over 10 pounds. No pushing, pulling, straining for 6 weeks.  3. No driving for around 1 week(s).  Do not drive if you are  taking narcotic pain medicine and make sure that your reaction time has returned.   4. You can shower as soon as the next day after surgery. Shower daily.  Use soap and water on your incision and pat dry; don't rub.  No tub baths or submerging your body in water until cleared by your surgeon. If you have the soap that was given to you by pre-surgical testing that was used before surgery, you do not need to use it afterwards because this can irritate your incisions.   5. No sexual activity and nothing in the vagina for 2 weeks.  6. You may experience a small amount of clear drainage from your incisions, which is normal.  If the drainage persists, increases, or changes color please call the office.  7. Do not use creams, lotions, or ointments such as neosporin on your incisions after surgery until advised by your surgeon because they can cause removal of the dermabond glue on your incisions.    8. Take Tylenol or ibuprofen first for pain and only use narcotic pain medication for severe pain not relieved by the Tylenol or Ibuprofen.  Monitor your Tylenol intake to a max of 4,000 mg.  Diet: 1. Low sodium Heart Healthy Diet is recommended.  2. It is safe to use a laxative, such as Miralax or Colace, if you have difficulty moving your bowels. You can take Sennakot at bedtime every evening to keep bowel movements regular and to prevent constipation.    Wound Care: 1. Keep clean and dry.  Shower daily.  Reasons to call the Doctor:  Fever - Oral temperature greater than 100.4 degrees Fahrenheit  Foul-smelling vaginal discharge  Difficulty urinating  Nausea and vomiting  Increased pain at the site of the incision that is unrelieved with pain medicine.  Difficulty breathing with or without chest pain  New calf pain especially if only on one side  Sudden, continuing increased vaginal bleeding with or without clots.   Contacts: For questions or concerns you should contact:  Dr. Jeral Pinch at [phone]  Joylene John, NP at [phone]  After Hours: call [phone] and have the GYN Oncologist paged/contacted

## 2019-10-08 ENCOUNTER — Ambulatory Visit (INDEPENDENT_AMBULATORY_CARE_PROVIDER_SITE_OTHER): Payer: 59 | Admitting: Nurse Practitioner

## 2019-10-08 ENCOUNTER — Encounter: Payer: Self-pay | Admitting: Gynecologic Oncology

## 2019-10-08 ENCOUNTER — Encounter: Payer: Self-pay | Admitting: Nurse Practitioner

## 2019-10-08 ENCOUNTER — Telehealth: Payer: Self-pay | Admitting: *Deleted

## 2019-10-08 VITALS — BP 120/78 | HR 63 | Temp 98.4°F | Ht 66.0 in | Wt 142.0 lb

## 2019-10-08 DIAGNOSIS — Z8542 Personal history of malignant neoplasm of other parts of uterus: Secondary | ICD-10-CM | POA: Diagnosis not present

## 2019-10-08 DIAGNOSIS — I82501 Chronic embolism and thrombosis of unspecified deep veins of right lower extremity: Secondary | ICD-10-CM

## 2019-10-08 DIAGNOSIS — I2699 Other pulmonary embolism without acute cor pulmonale: Secondary | ICD-10-CM | POA: Diagnosis not present

## 2019-10-08 NOTE — Telephone Encounter (Signed)
Fax office note and clearance form to Dr Joan Flores

## 2019-10-08 NOTE — Progress Notes (Addendum)
10/08/2019 Sherry Williams [id number] [birth year]   CHIEF COMPLAINT: Schedule an EGD and colonoscopy   HISTORY OF PRESENT ILLNESS:  Sherry Williams is a 59 year female with a complex medical history. She was relatively heathy with the exception of having hypothyroidism until 2017. She developed bilateral calf pain described as  " Charlie horse" pain a few days before driving to [place] and [place] 01/2016. Upon her return to [facility], she was seen by her PCP with persistent calf pain. A D-dimer was positive, a chest CT showed a PE and LE doppler showed bilateral DVTs.  She underwent an extensive hematology consult with Dr. Marin Olp. No evidence of a coagulopathy disorder was identified. She was prescribed Xarelto for at least 6 months.  She developed post menopausal bleeding in 2018 which resulted in the diagnosis of uterine cancer. She underwent a complete hysterectomy and lymph node resection. No radiation or chemotherapy was required. She developed kidney stones with an obstruction which required surgery and a stent in 2019. In the fall of 2020 she developed numerous superficial clots in the small veins in her arms, ankles and feet. No anticoagulation was required. Dec. 2020 she had recurrence of bilateral calf pain, LE doppler showed 5 DVTs. Xarelto was restarted. She had some dizziness after starting Xarelto but she continued taking it. Feb. 2021 she developed visual changes, brain MRI  of the head identified numerous small acute/early subacute infarcts predominantly which occurred while she was on Xarelto.  She was admitted to [facility] 2/16. She was placed on Heparin IV and her neurostatus remained stable. She was discharged home 2/19/202 on Pradaxa.   She presented to [facility] ED on 09/25/2015 with complaints of having right arm heaviness and weakness which lasted about 10 minutes. A CT of the head 4/16 was negative. She was diagnosed with a TIA.  She was already on Lovenox 184m SQ daily and ASA 834mwas  added. She was discharged home with the instruction to follow up with her hematologists [facility]. Lovenox was discontinued. She is now on Arixtra 7.85m42mQ daily and ASA 29m44mily. She underwent a chest/abd/pelvic CT on 10/03/2019 which showed enlargement of abdominopelvic nodes, highly suspicious for metastatic disease from the patient's endometrial carcinoma. No evidence of metastatic disease to the chest. She [facility] to review the CTAP findings. She is scheduled for a robotic laparoscopic/possible laparotomy surgery and to remove 4 para aortic  lymph nodes on 10/21/2019. A PET scan is scheduled for 10/15/2019. She is currently on a 30 day heart monitor to rule out any arrhythmia resulting in microthrombus/emboli. She is scheduled to see neurologist Dr. AnnaBlanchie Servehe was referred to see Dr. CiriLinford Arnoldrecommended by Dr. EnneMarin Olp Dr. MollJoan Floresschedule a an EGD and colonoscopy to rule out UGI and  colon malignancy as possible cause of her hypercoagulable disorder. She wishes to schedule an EGD and colonoscopy asap, prior to her laparoscopic surgery scheduled on 5/11. Currently, she feels well. She remains active. She walks 4 to 6 miles daily. She has intermittent left flank pain that radiates down toward her left hip. She denies having any dysphagia, heartburn or upper abdominal pain. She is passing a normal formed brown bowel movement daily. No rectal bleeding or black stools. No family history of esophageal, gastric or colon cancer. She underwent  an EGD and colonoscopy by Dr. Collene Mares in 2012 which she reported was normal. She underwent a Cologuard Test 06/2019 which she reported was negative.   CBC Latest Ref Rng & Units 09/25/2019 09/15/2019 08/11/2019  WBC 4.0 - 10.5 K/uL 4.4 3.7(L) 4.4  Hemoglobin 12.0 - 15.0 g/dL 13.4 11.8(L) 12.0  Hematocrit 36.0 - 46.0 %  42.0 36.1 36.3  Platelets 150 - 400 K/uL 283 236 268   CMP Latest Ref Rng & Units 09/25/2019 09/15/2019 08/11/2019  Glucose 70 - 99 mg/dL 70 99 116(H)  BUN 6 - 20 mg/dL '12 13 15  ' Creatinine 0.44 - 1.00 mg/dL 0.65 0.80 0.92  Sodium 135 - 145 mmol/L 142 139 142  Potassium 3.5 - 5.1 mmol/L 3.7 4.0 4.0  Chloride 98 - 111 mmol/L 108 105 105  CO2 22 - 32 mmol/L '25 26 29  ' Calcium 8.9 - 10.3 mg/dL 9.3 9.5 9.8  Total Protein 6.5 - 8.1 g/dL - 6.7 7.1  Total Bilirubin 0.3 - 1.2 mg/dL - 0.4 0.4  Alkaline Phos 38 - 126 U/L - 64 62  AST 15 - 41 U/L - 16 21  ALT 0 - 44 U/L - 10 25   Chest/abdominal/pelvic CT 10/03/2019:  Cardiovascular: Aortic atherosclerosis. Tortuous thoracic aorta. Normal heart size, without pericardial effusion. No central pulmonary embolism, on this non-dedicated study. Mediastinum/Nodes: No supraclavicular adenopathy. No mediastinal or hilar adenopathy. Lungs/Pleura: No pleural fluid. Mild biapical pleuroparenchymal scarring. Musculoskeletal: No acute osseous abnormality. Hepatobiliary: Normal liver. Normal gallbladder, without biliary ductal dilatation. Pancreas: Normal, without mass or ductal dilatation. Spleen: Normal in size, without focal abnormality. Adrenals/Urinary Tract: Normal adrenal glands. Too small to characterize lower pole left renal lesion. Normal right kidney, without hydronephrosis. Normal urinary bladder. Stomach/Bowel: Proximal gastric underdistention. Normal colon and terminal ileum. Normal small bowel. Vascular/Lymphatic: Aortic atherosclerosis. Subtle edema within the right groin may be the site of reported deep venous thrombosis. Development of mild abdominal adenopathy since 05/13/2018. Left periaortic node measures 1.0 cm on 70/2, new. A retrocaval node measures 8 mm on 65/2 and is significantly enlarged compared to the prior. Left common iliac node measures 8 mm on 84/2 and is significantly enlarged since the prior. Right pelvic  sidewall nodes, including an external iliac node of 8 mm on 109/2, new. Reproductive: Hysterectomy.  Hysterectomy.  No adnexal mass. Other: No significant free fluid. No evidence of omental or peritoneal disease. Presumed injection site gas within the abdominal wall. Musculoskeletal: Bilateral L5 pars defects. No malalignment. Degenerate disc disease involves L1-2.    Past Medical History:  Diagnosis Date  . Allergy   . DVT (deep venous thrombosis) ([place])    lower extremity   . Endometrial ca ([place]) 03/14/2017  . Family history of adverse reaction to anesthesia    children had N/V   . Hypothyroidism    Hashimoto  . Lichen sclerosus   . MRSA (methicillin resistant staph aureus) culture positive   . Pulmonary embolism ([place]) 01/13/2016   After car trip  . Stroke [facility])    multiple   Past Surgical History:  Procedure Laterality Date  . BUBBLE STUDY  07/31/2019   Procedure: BUBBLE STUDY;  Surgeon: [name], MD;  Location: [facility];  Service: Cardiovascular;;  . CYSTOSCOPY WITH RETROGRADE PYELOGRAM, URETEROSCOPY AND STENT PLACEMENT Left 05/18/2018   Procedure: CYSTOSCOPY WITH RETROGRADE PYELOGRAM, LEFT URETEROSCOPY AND LEFT URETERAL STENT PLACEMENT;  Surgeon: [name], MD;  Location: [facility];  Service: Urology;  Laterality: Left;  . CYSTOSCOPY WITH RETROGRADE  PYELOGRAM, URETEROSCOPY AND STENT PLACEMENT Right 05/30/2018   Procedure: CYSTOSCOPY WITH RIGHT RETROGRADE PYELOGRAM, URETEROSCOPY LASER LITHOTRIPSY AND STENT PLACEMENT;  Surgeon: [name], MD;  Location: [facility];  Service: Urology;  Laterality: Right;  . HOLMIUM LASER APPLICATION Left 01/11/4480   Procedure: HOLMIUM LASER APPLICATION;  Surgeon: [name], MD;  Location: [facility];  Service: Urology;  Laterality: Left;  . HOLMIUM LASER APPLICATION Right 85/63/1497   Procedure: HOLMIUM LASER APPLICATION;  Surgeon: [name], MD;  Location: [facility];  Service: Urology;  Laterality: Right;  . MOUTH SURGERY    . ROBOTIC ASSISTED TOTAL HYSTERECTOMY WITH BILATERAL SALPINGO OOPHERECTOMY Bilateral 04/17/2017   Procedure: XI ROBOTIC ASSISTED TOTAL HYSTERECTOMY WITH BILATERAL SALPINGO OOPHORECTOMY WITH SENTINAL LYMPH NODE BIOPSY AND POSSIBLE LYMPHADENECTOMY;  Surgeon: [name], MD;  Location: [facility];  Service: Gynecology;  Laterality: Bilateral;  . TEE WITHOUT CARDIOVERSION N/A 07/31/2019   Procedure: TRANSESOPHAGEAL ECHOCARDIOGRAM (TEE);  Surgeon: [name], MD;  [facility];  Service: Cardiovascular;  Laterality: N/A;    reports that she has never smoked. She has never used smokeless tobacco. She reports current alcohol use of about 1.0 standard drinks of alcohol per week. She reports that she does not use drugs. family history includes Alzheimer's disease in her father; Arthritis in her mother; Breast cancer in her paternal grandmother; Cancer in her father; Deep vein thrombosis in her sister; Diabetes in her paternal grandfather; Hyperlipidemia in her father; Lung cancer in her maternal grandmother; Other in her sister; Pancreatic cancer in her paternal grandfather; Prostate cancer in her father; Stroke in her father. Allergies  Allergen Reactions  . Bee Venom Anaphylaxis and Swelling  . Adhesive [Tape] Other (See Comments)    Irritation and red      Outpatient Encounter Medications as of 10/08/2019  Medication Sig  . ARMOUR THYROID 60 MG tablet TAKE 1 TABLET BY MOUTH EVERY DAY BEFORE BREAKFAST. TAKE 1 DAILY 5 DAYS PER WEEK (Patient taking differently: Take 60 mg by mouth See admin instructions. Taking 49m 5 days a week)  . ARMOUR THYROID 90 MG tablet TAKE 1 TABLET BY MOUTH EVERY DAY TWO DAYS PER WEEK (Patient taking differently: Take 90 mg by mouth See admin instructions. Taking 920m2 days a week  ( could not provide schedule))  . ASPIRIN 81 PO Take by mouth daily.  . fondaparinux (ARIXTRA) 7.5 MG/0.6ML SOLN  injection Inject 0.6 mLs (7.5 mg total) into the skin daily.   No facility-administered encounter medications on file as of 10/08/2019.     REVIEW OF SYSTEMS: All other systems reviewed and negative except where noted in the History of Present Illness.   PHYSICAL EXAM: BP 120/78   Pulse 63   Temp 98.4 F (36.9 C)   Ht '5\' 6"'  (1.676 m)   Wt 142 lb (64.4 kg)   LMP 09/04/2012   BMI 22.92 kg/m  General: Well developed  [age]ear old female in  no acute distress. Head: Normocephalic and atraumatic. Eyes:  Sclerae non-icteric, conjunctive pink. Ears: Normal auditory acuity. Mouth: Dentition intact. No ulcers or lesions.  Neck: Supple, no  thyromegaly. Shotty left cervical lymph node.  Lungs: Clear bilaterally to auscultation without wheezes, crackles or rhonchi. Heart: Regular rate and rhythm. No murmur, rub or gallop appreciated.  Abdomen: Soft, nontender, non distended. No masses. No hepatosplenomegaly. Normoactive bowel sounds x 4 quadrants. Laparoscopic scars intact.  Rectal: Deferred.  Musculoskeletal: Symmetrical with no gross deformities. Skin: Warm  and dry. No rash or lesions on visible extremities. Extremities: No edema. Neurological: Alert oriented x 4, no focal deficits.  Psychological:  Alert and cooperative. Normal mood and affect.  ASSESSMENT AND PLAN:  12. 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 She presents today to schedule an EGD/colonoscopy to rule out UGI and colon malignancy as cause of her hypercoagulable disorder. She reported having a normal EGD and colonoscopy by Dr. Collene Mares in 2012. She is on Arixtra and ASA. She is clearly at high risk for further embolic events if her anticoagulation is interrupted. Pursuing a diagnostic EGD/colonoscopy will be challenging. I will discuss the following options with Dr. Bryan Lemma:  # 1.  Schedule a diagnostic EGD/colonoscopy without stopping her anticoagulation or ASA, if a suspicious UGI or colon lesion or mass is identified then a repeat EGD or colonoscopy off Arixta with Heparin bridge to be scheduled. # 3. Schedule an EGD or colonoscopy at [facility] as inpatient with patient on IV heparin to be held for 3-4 hours prior to her procedures.   Further recommendations per Dr. Bryan Lemma   I have requested a copy of her EGD/colonoscopy in 2012 from Dr. Lorie Apley office.   I have spent a total of 60 minutes obtaining the patient's history, completing a physical exam and review of her medica records.    ADDENDUM: See my chart message from patient 4/29/20201 as follows: "Ervie Mccard, Since we met yesterday, I received messages from Dr. Joan Flores, my [facility] hematologist, and a call from his assistant this morning.  After reviewing my CT scans and pending 5/11 surgery to biopsy my lymph nodes, he said that I should wait on the GI and some other procedures".   EGD/colonoscopy on hold for now. Dr. Bryan Lemma was updated. Further GI recommendations to be determined after she completes her surgery 5/11.     CC:  [name], MD

## 2019-10-08 NOTE — Patient Instructions (Signed)
If you are age 60 or older, your body mass index should be between 23-30. Your Body mass index is 22.92 kg/m. If this is out of the aforementioned range listed, please consider follow up with your Primary Care Provider.  If you are age 72 or younger, your body mass index should be between 19-25. Your Body mass index is 22.92 kg/m. If this is out of the aformentioned range listed, please consider follow up with your Primary Care Provider.   Sherry Williams will contact you regarding scheduling a colonoscopy after Dr Suzan Nailer reviews your case.  Due to recent changes in healthcare laws, you may see the results of your imaging and laboratory studies on MyChart before your provider has had a chance to review them.  We understand that in some cases there may be results that are confusing or concerning to you. Not all laboratory results come back in the same time frame and the provider may be waiting for multiple results in order to interpret others.  Please give [place] 48 hours in order for your provider to thoroughly review all the results before contacting the office for clarification of your results.   Thank you for choosing [facility] Noralyn Pick, CRNP

## 2019-10-09 ENCOUNTER — Encounter (HOSPITAL_COMMUNITY): Payer: Self-pay

## 2019-10-09 ENCOUNTER — Other Ambulatory Visit: Payer: Self-pay | Admitting: Gynecologic Oncology

## 2019-10-09 DIAGNOSIS — C541 Malignant neoplasm of endometrium: Secondary | ICD-10-CM

## 2019-10-09 DIAGNOSIS — R590 Localized enlarged lymph nodes: Secondary | ICD-10-CM

## 2019-10-09 NOTE — Progress Notes (Signed)
Agree with the assessment and plan as outlined by Carl Best, NP.  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  Agree with plan for EGD/colonoscopy, and plan to expedite on my schedule.  Given recent thrombotic events despite anticoagulation, I think it more prudent to remain on Arixtra/ASA.  Can still perform biopsies and limited polypectomy, but agreed, would not be able to resect any larger polyps, which would require a procedure at a later date with a heparin bridge.  However, we still would be able to accomplish the goals of the study which would be ruling out GI malignancy.  Therefore, I feel the benefits of remaining on anticoagulation/antiplatelet therapy outweigh the periprocedural risks.  -Schedule EGD/colonoscopy ASAP with me -Remain on Arixtra/ASA throughout the periprocedural period  Gerrit Heck, DO, [facility]

## 2019-10-09 NOTE — Progress Notes (Addendum)
PCP - Carolann Littler Cardiologist -   Chest x-ray - CT chest 10-03-19 epic EKG - 09-25-19 epic Stress Test -  ECHO - [facility] 07-31-19 , [facility] 07-30-19 epic Cardiac Cath -   Sleep Study -  CPAP -   Fasting Blood Sugar -  Checks Blood Sugar _____ times a day  Blood Thinner Instructions: Arixtra, 81mg  asa Waiting on Dr. Marin Olp and Dr. Molinda Bailiff to coordinate with Dr. Berline Lopes regarding blood thinner instructions . Pt. Continues clotting with blood thinners. Needing test results from surgery to narrow down cause per pt.   INSTRUCTIONS ON CHART for ARIXTRA  Aspirin Instructions: Last Dose:  Anesthesia review: PE,DVT,+ ANA result , stroke 2-21 and 4-21 regardless blood thinner  . Pt. States, she has been referred to rheumatology [facility]. Also has cardiology appt and neuro appt scheduled for after surgery Wearing heart monitor at home currently per PCP   Patient denies shortness of breath, fever, cough and chest pain at PAT appointment  NONE   Patient verbalized understanding of instructions that were given to them at the PAT appointment. Patient was also instructed that they will need to review over the PAT instructions again at home before surgery.

## 2019-10-09 NOTE — Patient Instructions (Addendum)
DUE TO COVID-19 ONLY ONE VISITOR IS ALLOWED TO COME WITH YOU AND STAY IN THE WAITING ROOM ONLY DURING PRE OP AND PROCEDURE DAY OF SURGERY. Two  VISITOR MAY VISIT WITH YOU AFTER SURGERY IN YOUR PRIVATE ROOM DURING VISITING HOURS ONLY!  10a-8p  YOU NEED TO HAVE A COVID 19 TEST ON_ 5-7-21______ @___2 :30pm____, THIS TEST MUST BE DONE BEFORE SURGERY, COME  [address], [place] , [address].  ([name]) ONCE YOUR COVID TEST IS COMPLETED, PLEASE BEGIN THE QUARANTINE INSTRUCTIONS AS OUTLINED IN YOUR HANDOUT.                Sherry Williams  10/09/2019   Your procedure is scheduled on: 10-21-19   Report to [facility] Main  Entrance   Report to admitting at       0900 AM     Call this number if you have problems the morning of surgery [phone]    Remember:Eat a light diet the day before surgery.  Examples including soups, broths, toast, yogurt, mashed potatoes.  Things to avoid include carbonated beverages (fizzy beverages), raw fruits and raw vegetables, or beans.   If your bowels are filled with gas, your surgeon will have difficulty visualizing your pelvic organs which increases your surgical risks.   Do not eat food or drink liquids :After Midnight.   BRUSH YOUR TEETH MORNING OF SURGERY AND RINSE YOUR MOUTH OUT, NO CHEWING GUM CANDY OR MINTS.     Take these medicines the morning of surgery with A SIP OF WATER: thyroid pill,                                  You may not have any metal on your body including hair pins and              piercings  Do not wear jewelry, make-up, lotions, powders or perfumes, deodorant             Do not wear nail polish on your fingernails.  Do not shave  48 hours prior to surgery.              Do not bring valuables to the hospital. [place].  Contacts, dentures or bridgework may not be worn into surgery.  Leave suitcase in the car. After surgery it may be brought to  your room.     Patients discharged the day of surgery will not be allowed to drive home. IF YOU ARE HAVING SURGERY AND GOING HOME THE SAME DAY, YOU MUST HAVE AN ADULT TO DRIVE YOU HOME AND BE WITH YOU FOR 24 HOURS. YOU MAY GO HOME BY TAXI OR UBER OR ORTHERWISE, BUT AN ADULT MUST ACCOMPANY YOU HOME AND STAY WITH YOU FOR 24 HOURS.  Name and phone number of your driver:  Special Instructions: N/A              Please read over the following fact sheets you were given: _____________________________________________________________________             [facility] - Preparing for Surgery Before surgery, you can play an important role.  Because skin is not sterile, your skin needs to be as free of germs as possible.  You can reduce the number of germs on your skin by washing with  CHG (chlorahexidine gluconate) soap before surgery.  CHG is an antiseptic cleaner which kills germs and bonds with the skin to continue killing germs even after washing. Please DO NOT use if you have an allergy to CHG or antibacterial soaps.  If your skin becomes reddened/irritated stop using the CHG and inform your nurse when you arrive at Short Stay. Do not shave (including legs and underarms) for at least 48 hours prior to the first CHG shower.  You may shave your face/neck. Please follow these instructions carefully:  1.  Shower with CHG Soap the night before surgery and the  morning of Surgery.  2.  If you choose to wash your hair, wash your hair first as usual with your  normal  shampoo.  3.  After you shampoo, rinse your hair and body thoroughly to remove the  shampoo.                           4.  Use CHG as you would any other liquid soap.  You can apply chg directly  to the skin and wash                       Gently with a scrungie or clean washcloth.  5.  Apply the CHG Soap to your body ONLY FROM THE NECK DOWN.   Do not use on face/ open                           Wound or open sores. Avoid contact with eyes, ears  mouth and genitals (private parts).                       Wash face,  Genitals (private parts) with your normal soap.             6.  Wash thoroughly, paying special attention to the area where your surgery  will be performed.  7.  Thoroughly rinse your body with warm water from the neck down.  8.  DO NOT shower/wash with your normal soap after using and rinsing off  the CHG Soap.                9.  Pat yourself dry with a clean towel.            10.  Wear clean pajamas.            11.  Place clean sheets on your bed the night of your first shower and do not  sleep with pets. Day of Surgery : Do not apply any lotions/deodorants the morning of surgery.  Please wear clean clothes to the hospital/surgery center.  FAILURE TO FOLLOW THESE INSTRUCTIONS MAY RESULT IN THE CANCELLATION OF YOUR SURGERY PATIENT SIGNATURE_________________________________  NURSE SIGNATURE__________________________________  ________________________________________________________________________   Adam Phenix  An incentive spirometer is a tool that can help keep your lungs clear and active. This tool measures how well you are filling your lungs with each breath. Taking long deep breaths may help reverse or decrease the chance of developing breathing (pulmonary) problems (especially infection) following:  A long period of time when you are unable to move or be active. BEFORE THE PROCEDURE   If the spirometer includes an indicator to show your best effort, your nurse or respiratory therapist will set it to a desired goal.  If possible, sit up straight or lean  slightly forward. Try not to slouch.  Hold the incentive spirometer in an upright position. INSTRUCTIONS FOR USE  1. Sit on the edge of your bed if possible, or sit up as far as you can in bed or on a chair. 2. Hold the incentive spirometer in an upright position. 3. Breathe out normally. 4. Place the mouthpiece in your mouth and seal your lips  tightly around it. 5. Breathe in slowly and as deeply as possible, raising the piston or the ball toward the top of the column. 6. Hold your breath for 3-5 seconds or for as long as possible. Allow the piston or ball to fall to the bottom of the column. 7. Remove the mouthpiece from your mouth and breathe out normally. 8. Rest for a few seconds and repeat Steps 1 through 7 at least 10 times every 1-2 hours when you are awake. Take your time and take a few normal breaths between deep breaths. 9. The spirometer may include an indicator to show your best effort. Use the indicator as a goal to work toward during each repetition. 10. After each set of 10 deep breaths, practice coughing to be sure your lungs are clear. If you have an incision (the cut made at the time of surgery), support your incision when coughing by placing a pillow or rolled up towels firmly against it. Once you are able to get out of bed, walk around indoors and cough well. You may stop using the incentive spirometer when instructed by your caregiver.  RISKS AND COMPLICATIONS  Take your time so you do not get dizzy or light-headed.  If you are in pain, you may need to take or ask for pain medication before doing incentive spirometry. It is harder to take a deep breath if you are having pain. AFTER USE  Rest and breathe slowly and easily.  It can be helpful to keep track of a log of your progress. Your caregiver can provide you with a simple table to help with this. If you are using the spirometer at home, follow these instructions: [place] IF:   You are having difficultly using the spirometer.  You have trouble using the spirometer as often as instructed.  Your pain medication is not giving enough relief while using the spirometer.  You develop fever of 100.5 F (38.1 C) or higher. SEEK IMMEDIATE MEDICAL CARE IF:   You cough up bloody sputum that had not been present before.  You develop fever of 102 F  (38.9 C) or greater.  You develop worsening pain at or near the incision site. MAKE SURE YOU:   Understand these instructions.  Will watch your condition.  Will get help right away if you are not doing well or get worse. Document Released: 10/09/2006 Document Revised: 08/21/2011 Document Reviewed: 12/10/2006 ExitCare Patient Information 2014 ExitCare, [place].   ________________________________________________________________________  WHAT IS A BLOOD TRANSFUSION? Blood Transfusion Information  A transfusion is the replacement of blood or some of its parts. Blood is made up of multiple cells which provide different functions.  Red blood cells carry oxygen and are used for blood loss replacement.  White blood cells fight against infection.  Platelets control bleeding.  Plasma helps clot blood.  Other blood products are available for specialized needs, such as hemophilia or other clotting disorders. BEFORE THE TRANSFUSION  Who gives blood for transfusions?   Healthy volunteers who are fully evaluated to make sure their blood is safe. This is blood bank blood. Transfusion  therapy is the safest it has ever been in the practice of medicine. Before blood is taken from a donor, a complete history is taken to make sure that person has no history of diseases nor engages in risky social behavior (examples are intravenous drug use or sexual activity with multiple partners). The donor's travel history is screened to minimize risk of transmitting infections, such as malaria. The donated blood is tested for signs of infectious diseases, such as HIV and hepatitis. The blood is then tested to be sure it is compatible with you in order to minimize the chance of a transfusion reaction. If you or a relative donates blood, this is often done in anticipation of surgery and is not appropriate for emergency situations. It takes many days to process the donated blood. RISKS AND COMPLICATIONS Although  transfusion therapy is very safe and saves many lives, the main dangers of transfusion include:   Getting an infectious disease.  Developing a transfusion reaction. This is an allergic reaction to something in the blood you were given. Every precaution is taken to prevent this. The decision to have a blood transfusion has been considered carefully by your caregiver before blood is given. Blood is not given unless the benefits outweigh the risks. AFTER THE TRANSFUSION  Right after receiving a blood transfusion, you will usually feel much better and more energetic. This is especially true if your red blood cells have gotten low (anemic). The transfusion raises the level of the red blood cells which carry oxygen, and this usually causes an energy increase.  The nurse administering the transfusion will monitor you carefully for complications. HOME CARE INSTRUCTIONS  No special instructions are needed after a transfusion. You may find your energy is better. Speak with your caregiver about any limitations on activity for underlying diseases you may have. SEEK MEDICAL CARE IF:   Your condition is not improving after your transfusion.  You develop redness or irritation at the intravenous (IV) site. SEEK IMMEDIATE MEDICAL CARE IF:  Any of the following symptoms occur over the next 12 hours:  Shaking chills.  You have a temperature by mouth above 102 F (38.9 C), not controlled by medicine.  Chest, back, or muscle pain.  People around you feel you are not acting correctly or are confused.  Shortness of breath or difficulty breathing.  Dizziness and fainting.  You get a rash or develop hives.  You have a decrease in urine output.  Your urine turns a dark color or changes to pink, red, or brown. Any of the following symptoms occur over the next 10 days:  You have a temperature by mouth above 102 F (38.9 C), not controlled by medicine.  Shortness of breath.  Weakness after normal  activity.  The white part of the eye turns yellow (jaundice).  You have a decrease in the amount of urine or are urinating less often.  Your urine turns a dark color or changes to pink, red, or brown. Document Released: 05/26/2000 Document Revised: 08/21/2011 Document Reviewed: 01/13/2008 [facility] Patient Information 2014 [place], [place].  _______________________________________________________________________

## 2019-10-13 ENCOUNTER — Encounter (HOSPITAL_COMMUNITY)
Admission: RE | Admit: 2019-10-13 | Discharge: 2019-10-13 | Disposition: A | Discharge status: Home / Self Care | Payer: 59 | Source: Ambulatory Visit | Attending: Gynecologic Oncology | Admitting: Gynecologic Oncology

## 2019-10-13 ENCOUNTER — Encounter (HOSPITAL_COMMUNITY): Payer: Self-pay

## 2019-10-13 ENCOUNTER — Telehealth: Payer: Self-pay

## 2019-10-13 ENCOUNTER — Other Ambulatory Visit: Payer: Self-pay

## 2019-10-13 DIAGNOSIS — Z01812 Encounter for preprocedural laboratory examination: Secondary | ICD-10-CM | POA: Diagnosis present

## 2019-10-13 HISTORY — DX: Personal history of urinary calculi: Z87.442

## 2019-10-13 HISTORY — DX: Pneumonia, unspecified organism: J18.9

## 2019-10-13 HISTORY — DX: Unspecified osteoarthritis, unspecified site: M19.90

## 2019-10-13 LAB — URINALYSIS, ROUTINE W REFLEX MICROSCOPIC
Bilirubin Urine: NEGATIVE
Glucose, UA: NEGATIVE mg/dL
Hgb urine dipstick: NEGATIVE
Ketones, ur: 5 mg/dL — AB
Leukocytes,Ua: NEGATIVE
Nitrite: NEGATIVE
Protein, ur: NEGATIVE mg/dL
Specific Gravity, Urine: 1.005 (ref 1.005–1.030)
pH: 6 (ref 5.0–8.0)

## 2019-10-13 LAB — CBC
HCT: 39.1 % (ref 36.0–46.0)
Hemoglobin: 12.3 g/dL (ref 12.0–15.0)
MCH: 28.5 pg (ref 26.0–34.0)
MCHC: 31.5 g/dL (ref 30.0–36.0)
MCV: 90.5 fL (ref 80.0–100.0)
Platelets: 274 10*3/uL (ref 150–400)
RBC: 4.32 MIL/uL (ref 3.87–5.11)
RDW: 13 % (ref 11.5–15.5)
WBC: 4 10*3/uL (ref 4.0–10.5)
nRBC: 0 % (ref 0.0–0.2)

## 2019-10-13 LAB — SURGICAL PCR SCREEN
MRSA, PCR: NEGATIVE
Staphylococcus aureus: NEGATIVE

## 2019-10-13 LAB — BASIC METABOLIC PANEL
Anion gap: 8 (ref 5–15)
BUN: 11 mg/dL (ref 6–20)
CO2: 25 mmol/L (ref 22–32)
Calcium: 8.9 mg/dL (ref 8.9–10.3)
Chloride: 109 mmol/L (ref 98–111)
Creatinine, Ser: 0.69 mg/dL (ref 0.44–1.00)
GFR calc Af Amer: >60 mL/min (ref >60)
GFR calc non Af Amer: >60 mL/min (ref >60)
Glucose, Bld: 145 mg/dL — ABNORMAL HIGH (ref 70–99)
Potassium: 4.1 mmol/L (ref 3.5–5.1)
Sodium: 142 mmol/L (ref 135–145)

## 2019-10-13 NOTE — Telephone Encounter (Signed)
Told Sherry Williams that the injection for the PET scan does not contain aspartame. It is a a nuclear mixture with sugar water. She can continue to wear her cardiac event monitor during the scan per Pet scan tech. Pt appreciated the follow up.

## 2019-10-15 ENCOUNTER — Encounter (HOSPITAL_COMMUNITY)
Admission: RE | Admit: 2019-10-15 | Discharge: 2019-10-15 | Disposition: A | Discharge status: Home / Self Care | Payer: 59 | Source: Ambulatory Visit | Attending: Gynecologic Oncology | Admitting: Gynecologic Oncology

## 2019-10-15 ENCOUNTER — Other Ambulatory Visit: Payer: Self-pay

## 2019-10-15 DIAGNOSIS — C541 Malignant neoplasm of endometrium: Secondary | ICD-10-CM | POA: Insufficient documentation

## 2019-10-15 DIAGNOSIS — R590 Localized enlarged lymph nodes: Secondary | ICD-10-CM | POA: Diagnosis present

## 2019-10-15 MED ORDER — FLUDEOXYGLUCOSE F - 18 (FDG) INJECTION: 6.8000 | Freq: Once | INTRAVENOUS | Status: DC | PRN

## 2019-10-16 ENCOUNTER — Inpatient Hospital Stay: Payer: 59 | Admitting: Hematology & Oncology

## 2019-10-16 ENCOUNTER — Encounter: Payer: Self-pay | Admitting: Hematology & Oncology

## 2019-10-16 ENCOUNTER — Inpatient Hospital Stay: Payer: 59 | Attending: Hematology & Oncology | Admitting: Hematology & Oncology

## 2019-10-16 ENCOUNTER — Inpatient Hospital Stay: Payer: 59

## 2019-10-16 ENCOUNTER — Inpatient Hospital Stay (HOSPITAL_BASED_OUTPATIENT_CLINIC_OR_DEPARTMENT_OTHER): Payer: 59 | Admitting: Gynecologic Oncology

## 2019-10-16 ENCOUNTER — Other Ambulatory Visit: Payer: 59

## 2019-10-16 VITALS — BP 102/69 | HR 69 | Temp 97.1°F | Resp 16 | Wt 137.0 lb

## 2019-10-16 DIAGNOSIS — Z7901 Long term (current) use of anticoagulants: Secondary | ICD-10-CM | POA: Insufficient documentation

## 2019-10-16 DIAGNOSIS — C541 Malignant neoplasm of endometrium: Secondary | ICD-10-CM

## 2019-10-16 DIAGNOSIS — I82441 Acute embolism and thrombosis of right tibial vein: Secondary | ICD-10-CM | POA: Diagnosis present

## 2019-10-16 DIAGNOSIS — R590 Localized enlarged lymph nodes: Secondary | ICD-10-CM | POA: Diagnosis not present

## 2019-10-16 DIAGNOSIS — I82451 Acute embolism and thrombosis of right peroneal vein: Secondary | ICD-10-CM | POA: Diagnosis not present

## 2019-10-16 DIAGNOSIS — I82442 Acute embolism and thrombosis of left tibial vein: Secondary | ICD-10-CM | POA: Insufficient documentation

## 2019-10-16 DIAGNOSIS — Z8673 Personal history of transient ischemic attack (TIA), and cerebral infarction without residual deficits: Secondary | ICD-10-CM | POA: Insufficient documentation

## 2019-10-16 DIAGNOSIS — Z8542 Personal history of malignant neoplasm of other parts of uterus: Secondary | ICD-10-CM | POA: Insufficient documentation

## 2019-10-16 DIAGNOSIS — C801 Malignant (primary) neoplasm, unspecified: Secondary | ICD-10-CM | POA: Diagnosis present

## 2019-10-16 DIAGNOSIS — I82401 Acute embolism and thrombosis of unspecified deep veins of right lower extremity: Secondary | ICD-10-CM

## 2019-10-16 LAB — GLUCOSE, CAPILLARY: Glucose-Capillary: 75 mg/dL (ref 70–99)

## 2019-10-16 NOTE — Telephone Encounter (Signed)
Sherry Williams, pls let the patient know she needs to address these questions to her oncologist. You are correct, I did not order the PET scan. An EGD and colonoscopy were to be scheduled at [facility] with Dr. Melanee Left but the patient called the office and sent a my chart message stating that her [facility] hematologist Dr. Joan Flores advised her not to schedule these procedures and she was to proceed with her surgery 10/21/2019. Refer to her prior my chart msgs. Thx.

## 2019-10-16 NOTE — Patient Instructions (Signed)
Received phone call in multiple MyChart messages from the patient about her PET scan that was completed yesterday.  We had a detailed conversation over the phone about her various questions which included questions about the PET scan itself, plan for surgery, adjuvant treatment if this is recurrent or new cancer, anticoagulation plan perioperative, and survival expectations.  In terms of her PET scan, I discussed the hypermetabolic areas, the radiologist tool (SUV) to margin the extent of hypermetabolism seen in the areas, the finding of a renal calculus, and reaffirmation of suspicious lymph nodes.  We reviewed again the plan for surgery to remove most if not all of the hypermetabolic pelvic and para-aortic lymph nodes.  I will plan to send at least one lymph node to pathology for frozen section to assure removal of tissue that will help [place] achieve a diagnosis.  I will likely not perform a full pelvic lymph node and para-aortic lymph node dissection to ideally limit the toxicity related to lymphadenectomy.  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  The patient understands that until we know if this is recurrent uterine cancer, if it is what the histology is, or whether it is a new primary will determine what and if she needs adjuvant therapy.  We also discussed other possible processes such as infection and inflammation that can cause hypermetabolism of lymph nodes although given the distribution, I voiced that this is very concerning for a malignant process.  I reviewed the recommendations that we received from Dr. Joan Flores regarding her anticoagulation.  She is to hold the Arixtra for 3 days preoperatively.  I have reviewed that she can take that baby aspirin up until 24 hours before surgery.  We will plan to restart the  Arixtra 1 or 2 days postop at a prophylactic dose and quickly work to increase back up to a therapeutic dose.  I have encouraged her to call Dr. Lorita Officer office if she has any other questions regarding the anticoagulation plan.  I also reviewed that her physician who helped coordinate the cardiac monitor suggested that we take the monitor off for surgery.  All the patient's other questions as well as that of her husband's were answered to the best of my ability.  Overall, I spent approximately 25 minutes on the phone with the patient discussing these concerns and questions.  Jeral Pinch MD Gynecologic Oncology

## 2019-10-16 NOTE — Progress Notes (Signed)
Hematology and Oncology Follow Up Visit  Sherry Williams OR:[id number] 04/14/1961 59 y.o. 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  It has been amazing how things have changed with her.  Unfortunately, there might be an issue with respect to recurrence of malignancy or a new malignancy.  We now have her on Arixtra.  She had a Doppler done of her legs.  This was also done early April.  There was an occlusive thrombus in the right posterior tibial vein and peroneal vein.  Also noted was a thrombus in the left posterior tibial vein.  She also seemed to have some more TIA type symptoms.  She actually did see Dr. Joan Flores at [facility].  As expected, he provided incredible insight.  He wanted her to have a CT scan done.  She has a history of stage I endometrial cancer back in November 2018.  I looked at the pathology report.  This was early stage.  It was well differentiated.  There was less than half the wall of invasion.  The CT scan, however, shows that she had some new lymph nodes in the abdomen and pelvis.  She saw her gynecologic oncologist.  This is Dr. Berline Lopes.  Dr. Berline Lopes ordered a PET scan on her.  Surprisingly, the PET scan showed multiple lymph nodes that were quite metabolically active.  The largest lymph node I think was 1.1 cm in size.  Dr. Berline Lopes feels that she needs to do exploratory laparoscopy to try to biopsy these lymph nodes to see if there is malignancy.  I would think that the chance of her endometrial cancer coming back would be easily less than 10%.  I reviewed the PET scan with Sherry Williams.  I showed her the positive lymph nodes.  I just am suspicious as to these being from endometrial cancer.  This certainly could be more lymphoma/Hodgkin's disease.  They could  also be inflammatory lymph nodes and possibly collagen vascular related.  Sherry Williams is to undergo surgery on May 11th.  Hopefully this will be outpatient.  She will stop the Arixtra 3 days before surgery.  Otherwise, Sherry Williams it really looks great.  She is trying to stay active.  She still has had occasional neurological issues.  They are definitely transient.  She has had no bleeding.  There is been no obvious change in bowel or bladder habits.  She has had no rashes.  There is been no cough or shortness of breath.  She did celebrate Mother's Day weekend last weekend with her family.  Currently, her performance status is ECOG 0.     Medications:  Current Outpatient Medications:  .  ARMOUR THYROID 60 MG tablet, TAKE 1 TABLET BY MOUTH EVERY DAY BEFORE BREAKFAST. TAKE 1 DAILY 5 DAYS PER WEEK (Patient taking differently: Take 60 mg by mouth See admin instructions. Taking 60mg  5 days a week), Disp: 90 tablet, Rfl: 2 .  ARMOUR THYROID 90 MG tablet, TAKE 1 TABLET BY MOUTH EVERY DAY TWO DAYS PER WEEK (Patient taking differently: Take 90 mg by mouth See admin instructions. Taking 90mg  2 days a week  ( could not provide schedule)), Disp: 90 tablet, Rfl: 1 .  ASPIRIN 81 PO, Take by mouth daily., Disp: , Rfl:  .  fondaparinux (ARIXTRA) 7.5 MG/0.6ML SOLN injection, Inject 0.6 mLs (7.5 mg total) into the skin daily., Disp: 18 mL, Rfl: 3 No current facility-administered medications for this visit.  Facility-Administered Medications Ordered in Other Visits:  .  fludeoxyglucose F - 18 (FDG) injection 6.8 millicurie, 6.8 millicurie, Intravenous, Once PRN, Poff, Nicoletta Dress, MD  Allergies:  Allergies  Allergen Reactions  . Bee Venom Anaphylaxis and Swelling  . Adhesive [Tape] Other (See Comments)    Irritation and red    Past Medical History, Surgical history, Social history, and Family History were reviewed and updated.  Review of Systems:  Physical Exam:  weight is 137 lb (62.1 kg). Her  temporal temperature is 97.1 F (36.2 C) (abnormal). Her blood pressure is 102/69 and her pulse is 69. Her respiration is 16 and oxygen saturation is 100%.   Wt Readings from Last 3 Encounters:  10/16/19 137 lb (62.1 kg)  10/13/19 140 lb (63.5 kg)  10/13/19 139 lb (63 kg)    Physical Exam Vitals reviewed.  HENT:     Head: Normocephalic and atraumatic.  Eyes:     Pupils: Pupils are equal, round, and reactive to light.  Cardiovascular:     Rate and Rhythm: Normal rate and regular rhythm.     Heart sounds: Normal heart sounds.  Pulmonary:     Effort: Pulmonary effort is normal.     Breath sounds: Normal breath sounds.  Abdominal:     General: Bowel sounds are normal.     Palpations: Abdomen is soft.  Musculoskeletal:        General: No tenderness or deformity. Normal range of motion.     Cervical back: Normal range of motion.  Lymphadenopathy:     Cervical: No cervical adenopathy.  Skin:    General: Skin is warm and dry.     Findings: No erythema or rash.  Neurological:     Mental Status: She is alert and oriented to person, place, and time.  Psychiatric:        Behavior: Behavior normal.        Thought Content: Thought content normal.        Judgment: Judgment normal.      Lab Results  Component Value Date   WBC 4.0 10/13/2019   HGB 12.3 10/13/2019   HCT 39.1 10/13/2019   MCV 90.5 10/13/2019   PLT 274 10/13/2019     Chemistry      Component Value Date/Time   NA 142 10/13/2019 1327   NA 142 02/15/2017 1518   NA 140 04/13/2016 1055   K 4.1 10/13/2019 1327   K 4.1 02/15/2017 1518   K 4.1 04/13/2016 1055   CL 109 10/13/2019 1327   CL 107 02/15/2017 1518   CO2 25 10/13/2019 1327   CO2 29 02/15/2017 1518   CO2 27 04/13/2016 1055   BUN 11 10/13/2019 1327   BUN 17 02/15/2017 1518   BUN 22.1 04/13/2016 1055   CREATININE 0.69 10/13/2019 1327   CREATININE 0.80 09/15/2019 1522   CREATININE 1.0 02/15/2017 1518   CREATININE 0.8 04/13/2016 1055      Component  Value Date/Time   CALCIUM 8.9 10/13/2019 1327   CALCIUM 9.3 02/15/2017 1518   CALCIUM 9.9 04/13/2016 1055   ALKPHOS 64 09/15/2019 1522   ALKPHOS 79 02/15/2017 1518   ALKPHOS 81 04/13/2016 1055   AST 16 09/15/2019 1522   AST 20 04/13/2016 1055   ALT 10 09/15/2019 1522   ALT 24 02/15/2017 1518  ALT 22 04/13/2016 1055   BILITOT 0.4 09/15/2019 1522   BILITOT 0.44 04/13/2016 1055      Impression and Plan: Ms. Outler is a 59 year old Caucasian female.  She has had issues with recurrence of thromboembolic events.  She has had these systemically and in the brain.  I would be really surprised if this is from malignancy.  However, this is a possibility.  I forgot to mention that she is wearing a Holter monitor.  This is to see if she has any issues with atrial fibrillation.  We will see what the surgical laparoscopy has to show.  This will be incredibly interesting.  Again I would think that these lymph nodes have to be lymphoma or Hodgkin's disease.  Maybe, we could be looking at melanoma.  I know she has not had any obvious skin cancer in the past.  However, melanoma is a possibility as this could mimic other cancers.  Collagen vascular disease is also a possibility and his lymph nodes could be granulomatous.  We will await the path report.  If she is hospitalized overnight, I will see her in the morning.  She is still has a great attitude.  She has a lot of support from her family which is wonderful.  She really has done everything that we have asked her to do.  We will figure out when to get her back depending on what the pathology shows.  I spent about an hour with her today.  This is an incredibly complicated and there are a lot of issues that we have to try to sort out.    [name], MD 5/6/20213:51 PM

## 2019-10-17 ENCOUNTER — Telehealth: Payer: Self-pay | Admitting: Hematology & Oncology

## 2019-10-17 ENCOUNTER — Other Ambulatory Visit (HOSPITAL_COMMUNITY)
Admission: RE | Admit: 2019-10-17 | Discharge: 2019-10-17 | Disposition: A | Discharge status: Home / Self Care | Payer: 59 | Source: Ambulatory Visit | Attending: Gynecologic Oncology | Admitting: Gynecologic Oncology

## 2019-10-17 DIAGNOSIS — Z01812 Encounter for preprocedural laboratory examination: Secondary | ICD-10-CM | POA: Diagnosis not present

## 2019-10-17 DIAGNOSIS — Z20822 Contact with and (suspected) exposure to covid-19: Secondary | ICD-10-CM | POA: Diagnosis not present

## 2019-10-17 NOTE — Telephone Encounter (Signed)
No los 5/6

## 2019-10-18 LAB — SARS CORONAVIRUS 2 (TAT 6-24 HRS): SARS Coronavirus 2: NEGATIVE

## 2019-10-20 ENCOUNTER — Other Ambulatory Visit: Payer: Self-pay | Admitting: Oncology

## 2019-10-20 ENCOUNTER — Telehealth: Payer: Self-pay | Admitting: Gynecologic Oncology

## 2019-10-20 ENCOUNTER — Telehealth: Payer: Self-pay

## 2019-10-20 ENCOUNTER — Ambulatory Visit (INDEPENDENT_AMBULATORY_CARE_PROVIDER_SITE_OTHER): Payer: 59 | Admitting: Otolaryngology

## 2019-10-20 NOTE — Telephone Encounter (Signed)
I called the patient and relayed discussion that we had a tumor board this morning.  While she has some hypermetabolic lymph nodes outside of her abdominal pelvic cavity, these are quite small and thought to be likely difficult for interventional radiology to biopsy and/or to get sufficient tissue for various staining.  Discussed with her plan to proceed as we have scheduled with surgery tomorrow to remove at least some of the involved lymph nodes in her pelvis and para-aortic region.  I will plan to send at least 1 to pathology intraoperatively tissue removed will help obtain a diagnosis.  All patient's questions answered.  Jeral Pinch MD Gynecologic Oncology

## 2019-10-20 NOTE — Telephone Encounter (Signed)
Sherry Williams states that she understands her pre op instructions and has no further questions at this time.

## 2019-10-20 NOTE — Progress Notes (Signed)
Gynecologic Oncology Multi-Disciplinary Disposition Conference Note  Date of the Conference: 10/20/2019  Patient Name: Sherry Williams  Primary GYN Oncologist: Dr. Berline Lopes  Stage/Disposition:  Stage IA, grade 1 endometrial adenocarcinoma. Disposition is for possible IR biopsy of hypermetabolic neck lymph nodes and surgery with robotic pelvic and para-aortic lymph node dissection on 10/21/2019.   This Multidisciplinary conference took place involving physicians from [place], [place], Radiation Oncology, Pathology, Radiology along with the Gynecologic Oncology Nurse Practitioner and RN.  Comprehensive assessment of the patient's malignancy, staging, need for surgery, chemotherapy, radiation therapy, and need for further testing were reviewed. Supportive measures, both inpatient and following discharge were also discussed. The recommended plan of care is documented. Greater than 35 minutes were spent correlating and coordinating this patient's care.

## 2019-10-21 ENCOUNTER — Ambulatory Visit (HOSPITAL_COMMUNITY)
Admission: RE | Admit: 2019-10-21 | Discharge: 2019-10-21 | Disposition: A | Discharge status: Home / Self Care | Payer: 59 | Source: Ambulatory Visit | Attending: Gynecologic Oncology | Admitting: Gynecologic Oncology

## 2019-10-21 ENCOUNTER — Ambulatory Visit (HOSPITAL_COMMUNITY): Payer: 59 | Admitting: Physician Assistant

## 2019-10-21 ENCOUNTER — Other Ambulatory Visit: Payer: Self-pay | Admitting: Gynecologic Oncology

## 2019-10-21 ENCOUNTER — Encounter (HOSPITAL_COMMUNITY): Payer: Self-pay | Admitting: Gynecologic Oncology

## 2019-10-21 ENCOUNTER — Other Ambulatory Visit: Payer: Self-pay

## 2019-10-21 ENCOUNTER — Encounter (HOSPITAL_COMMUNITY)
Admission: RE | Disposition: A | Discharge status: Home / Self Care | Payer: Self-pay | Source: Ambulatory Visit | Attending: Gynecologic Oncology

## 2019-10-21 DIAGNOSIS — Z8542 Personal history of malignant neoplasm of other parts of uterus: Secondary | ICD-10-CM | POA: Insufficient documentation

## 2019-10-21 DIAGNOSIS — E063 Autoimmune thyroiditis: Secondary | ICD-10-CM | POA: Diagnosis not present

## 2019-10-21 DIAGNOSIS — Z86711 Personal history of pulmonary embolism: Secondary | ICD-10-CM | POA: Insufficient documentation

## 2019-10-21 DIAGNOSIS — C541 Malignant neoplasm of endometrium: Secondary | ICD-10-CM

## 2019-10-21 DIAGNOSIS — Z90722 Acquired absence of ovaries, bilateral: Secondary | ICD-10-CM | POA: Diagnosis not present

## 2019-10-21 DIAGNOSIS — Z8673 Personal history of transient ischemic attack (TIA), and cerebral infarction without residual deficits: Secondary | ICD-10-CM | POA: Insufficient documentation

## 2019-10-21 DIAGNOSIS — Z90710 Acquired absence of both cervix and uterus: Secondary | ICD-10-CM | POA: Insufficient documentation

## 2019-10-21 DIAGNOSIS — Z7901 Long term (current) use of anticoagulants: Secondary | ICD-10-CM | POA: Diagnosis not present

## 2019-10-21 DIAGNOSIS — Z86718 Personal history of other venous thrombosis and embolism: Secondary | ICD-10-CM | POA: Insufficient documentation

## 2019-10-21 DIAGNOSIS — C775 Secondary and unspecified malignant neoplasm of intrapelvic lymph nodes: Secondary | ICD-10-CM | POA: Diagnosis not present

## 2019-10-21 DIAGNOSIS — Z79890 Hormone replacement therapy (postmenopausal): Secondary | ICD-10-CM | POA: Diagnosis not present

## 2019-10-21 DIAGNOSIS — Z9079 Acquired absence of other genital organ(s): Secondary | ICD-10-CM | POA: Diagnosis not present

## 2019-10-21 DIAGNOSIS — Z7982 Long term (current) use of aspirin: Secondary | ICD-10-CM | POA: Diagnosis not present

## 2019-10-21 DIAGNOSIS — R590 Localized enlarged lymph nodes: Secondary | ICD-10-CM

## 2019-10-21 HISTORY — PX: ROBOTIC PELVIC AND PARA-AORTIC LYMPH NODE DISSECTION: SHX6210

## 2019-10-21 LAB — TYPE AND SCREEN
ABO/RH(D): O POS
Antibody Screen: NEGATIVE

## 2019-10-21 SURGERY — LYMPHADENECTOMY, PARA-AORTIC AND PELVIC, ROBOT-ASSISTED, LAPAROSCOPIC
Anesthesia: General

## 2019-10-21 MED ORDER — SODIUM CHLORIDE 0.9% FLUSH: 3.0000 mL | Freq: Two times a day (BID) | INTRAVENOUS | Status: DC

## 2019-10-21 MED ORDER — FENTANYL CITRATE (PF) 250 MCG/5ML IJ SOLN
INTRAMUSCULAR | Status: DC | PRN
  Administered 2019-10-21: 100 ug via INTRAVENOUS

## 2019-10-21 MED ORDER — HEPARIN SODIUM (PORCINE) 5000 UNIT/ML IJ SOLN
5000.0000 [IU] | INTRAMUSCULAR | Status: AC
  Administered 2019-10-21: 5000 [IU] via SUBCUTANEOUS
  Filled 2019-10-21: qty 1

## 2019-10-21 MED ORDER — ACETAMINOPHEN 500 MG PO TABS
1000.0000 mg | ORAL_TABLET | ORAL | Status: AC
  Administered 2019-10-21: 1000 mg via ORAL
  Filled 2019-10-21: qty 2

## 2019-10-21 MED ORDER — STERILE WATER FOR IRRIGATION IR SOLN
Status: DC | PRN
  Administered 2019-10-21: 1000 mL

## 2019-10-21 MED ORDER — ONDANSETRON HCL 4 MG/2ML IJ SOLN
INTRAMUSCULAR | Status: DC | PRN
  Administered 2019-10-21: 4 mg via INTRAVENOUS

## 2019-10-21 MED ORDER — DEXAMETHASONE SODIUM PHOSPHATE 10 MG/ML IJ SOLN
INTRAMUSCULAR | Status: DC | PRN
  Administered 2019-10-21: 8 mg via INTRAVENOUS

## 2019-10-21 MED ORDER — GABAPENTIN 300 MG PO CAPS
300.0000 mg | ORAL_CAPSULE | ORAL | Status: AC
  Administered 2019-10-21: 300 mg via ORAL
  Filled 2019-10-21: qty 1

## 2019-10-21 MED ORDER — ONDANSETRON HCL 4 MG/2ML IJ SOLN
INTRAMUSCULAR | Status: AC
  Filled 2019-10-21: qty 2

## 2019-10-21 MED ORDER — MIDAZOLAM HCL 2 MG/2ML IJ SOLN
INTRAMUSCULAR | Status: AC
  Filled 2019-10-21: qty 2

## 2019-10-21 MED ORDER — CELECOXIB 200 MG PO CAPS
400.0000 mg | ORAL_CAPSULE | ORAL | Status: AC
  Administered 2019-10-21: 400 mg via ORAL
  Filled 2019-10-21: qty 2

## 2019-10-21 MED ORDER — LACTATED RINGERS IV SOLN: INTRAVENOUS | Status: DC

## 2019-10-21 MED ORDER — ROCURONIUM BROMIDE 10 MG/ML (PF) SYRINGE
PREFILLED_SYRINGE | INTRAVENOUS | Status: AC
  Filled 2019-10-21: qty 10

## 2019-10-21 MED ORDER — KETAMINE HCL 10 MG/ML IJ SOLN
INTRAMUSCULAR | Status: AC
  Filled 2019-10-21: qty 1

## 2019-10-21 MED ORDER — KETAMINE HCL 10 MG/ML IJ SOLN
INTRAMUSCULAR | Status: DC | PRN
Start: 2019-10-21 — End: 2019-10-21
  Administered 2019-10-21: 30 mg via INTRAVENOUS

## 2019-10-21 MED ORDER — LACTATED RINGERS IR SOLN
Status: DC | PRN
  Administered 2019-10-21: 1000 mL

## 2019-10-21 MED ORDER — EPHEDRINE 5 MG/ML INJ
INTRAVENOUS | Status: AC
  Filled 2019-10-21: qty 10

## 2019-10-21 MED ORDER — LIDOCAINE 20MG/ML (2%) 15 ML SYRINGE OPTIME
INTRAMUSCULAR | Status: DC | PRN
Start: 2019-10-21 — End: 2019-10-21
  Administered 2019-10-21: 1.5 mg/kg/h via INTRAVENOUS

## 2019-10-21 MED ORDER — EPHEDRINE SULFATE-NACL 50-0.9 MG/10ML-% IV SOSY
PREFILLED_SYRINGE | INTRAVENOUS | Status: DC | PRN
  Administered 2019-10-21: 10 mg via INTRAVENOUS

## 2019-10-21 MED ORDER — OXYCODONE HCL 5 MG PO TABS: 5.0000 mg | ORAL_TABLET | ORAL | 0 refills | Status: DC | PRN

## 2019-10-21 MED ORDER — ROCURONIUM BROMIDE 10 MG/ML (PF) SYRINGE
PREFILLED_SYRINGE | INTRAVENOUS | Status: DC | PRN
  Administered 2019-10-21: 70 mg via INTRAVENOUS
  Administered 2019-10-21: 10 mg via INTRAVENOUS
  Administered 2019-10-21: 20 mg via INTRAVENOUS

## 2019-10-21 MED ORDER — BUPIVACAINE HCL 0.25 % IJ SOLN
INTRAMUSCULAR | Status: AC
  Filled 2019-10-21: qty 1

## 2019-10-21 MED ORDER — SENNOSIDES-DOCUSATE SODIUM 8.6-50 MG PO TABS: 2.0000 | ORAL_TABLET | Freq: Every day | ORAL | 0 refills | Status: DC

## 2019-10-21 MED ORDER — LIDOCAINE HCL 2 % IJ SOLN
INTRAMUSCULAR | Status: AC
  Filled 2019-10-21: qty 20

## 2019-10-21 MED ORDER — DEXAMETHASONE SODIUM PHOSPHATE 4 MG/ML IJ SOLN: 4.0000 mg | INTRAMUSCULAR | Status: DC

## 2019-10-21 MED ORDER — MIDAZOLAM HCL 5 MG/5ML IJ SOLN
INTRAMUSCULAR | Status: DC | PRN
  Administered 2019-10-21: 2 mg via INTRAVENOUS

## 2019-10-21 MED ORDER — SUGAMMADEX SODIUM 200 MG/2ML IV SOLN
INTRAVENOUS | Status: DC | PRN
  Administered 2019-10-21: 200 mg via INTRAVENOUS

## 2019-10-21 MED ORDER — STERILE WATER FOR INJECTION IJ SOLN: INTRAMUSCULAR | Status: DC | PRN

## 2019-10-21 MED ORDER — LIDOCAINE 2% (20 MG/ML) 5 ML SYRINGE
INTRAMUSCULAR | Status: AC
  Filled 2019-10-21: qty 5

## 2019-10-21 MED ORDER — SCOPOLAMINE 1 MG/3DAYS TD PT72
1.0000 | MEDICATED_PATCH | TRANSDERMAL | Status: DC
  Administered 2019-10-21: 1.5 mg via TRANSDERMAL
  Filled 2019-10-21: qty 1

## 2019-10-21 MED ORDER — FENTANYL CITRATE (PF) 250 MCG/5ML IJ SOLN
INTRAMUSCULAR | Status: AC
  Filled 2019-10-21: qty 5

## 2019-10-21 MED ORDER — DEXAMETHASONE SODIUM PHOSPHATE 10 MG/ML IJ SOLN
INTRAMUSCULAR | Status: AC
  Filled 2019-10-21: qty 1

## 2019-10-21 MED ORDER — PROPOFOL 10 MG/ML IV BOLUS
INTRAVENOUS | Status: AC
  Filled 2019-10-21: qty 20

## 2019-10-21 MED ORDER — PROPOFOL 10 MG/ML IV BOLUS
INTRAVENOUS | Status: DC | PRN
  Administered 2019-10-21: 160 mg via INTRAVENOUS

## 2019-10-21 MED ORDER — LIDOCAINE 2% (20 MG/ML) 5 ML SYRINGE
INTRAMUSCULAR | Status: DC | PRN
  Administered 2019-10-21: 100 mg via INTRAVENOUS

## 2019-10-21 MED ORDER — BUPIVACAINE HCL 0.25 % IJ SOLN
INTRAMUSCULAR | Status: DC | PRN
  Administered 2019-10-21: 50 mL

## 2019-10-21 SURGICAL SUPPLY — 77 items
ADH SKN CLS APL DERMABOND .7 (GAUZE/BANDAGES/DRESSINGS) ×1
AGENT HMST KT MTR STRL THRMB (HEMOSTASIS)
APL ESCP 34 STRL LF DISP (HEMOSTASIS)
APL SRG 38 LTWT LNG FL B (MISCELLANEOUS) ×1
APPLICATOR ARISTA FLEXITIP XL (MISCELLANEOUS) ×2 IMPLANT
APPLICATOR SURGIFLO ENDO (HEMOSTASIS) IMPLANT
BACTOSHIELD CHG 4% 4OZ (MISCELLANEOUS)
BAG LAPAROSCOPIC 12 15 PORT 16 (BASKET) IMPLANT
BAG RETRIEVAL 12/15 (BASKET)
BAG RETRIEVAL 12/15MM (BASKET)
BAG SPEC RTRVL LRG 6X4 10 (ENDOMECHANICALS)
BLADE SURG SZ10 CARB STEEL (BLADE) IMPLANT
COVER BACK TABLE 60X90IN (DRAPES) ×1 IMPLANT
COVER TIP SHEARS 8 DVNC (MISCELLANEOUS) ×1 IMPLANT
COVER TIP SHEARS 8MM DA VINCI (MISCELLANEOUS) ×3
COVER WAND RF STERILE (DRAPES) IMPLANT
DECANTER SPIKE VIAL GLASS SM (MISCELLANEOUS) ×2 IMPLANT
DERMABOND ADVANCED (GAUZE/BANDAGES/DRESSINGS) ×2
DERMABOND ADVANCED .7 DNX12 (GAUZE/BANDAGES/DRESSINGS) ×1 IMPLANT
DRAPE ARM DVNC X/XI (DISPOSABLE) ×4 IMPLANT
DRAPE COLUMN DVNC XI (DISPOSABLE) ×1 IMPLANT
DRAPE DA VINCI XI ARM (DISPOSABLE) ×12
DRAPE DA VINCI XI COLUMN (DISPOSABLE) ×3
DRAPE SHEET LG 3/4 BI-LAMINATE (DRAPES) ×3 IMPLANT
DRAPE SURG IRRIG POUCH 19X23 (DRAPES) ×3 IMPLANT
DRSG OPSITE POSTOP 4X6 (GAUZE/BANDAGES/DRESSINGS) IMPLANT
DRSG OPSITE POSTOP 4X8 (GAUZE/BANDAGES/DRESSINGS) IMPLANT
DRSG TEGADERM 2-3/8X2-3/4 SM (GAUZE/BANDAGES/DRESSINGS) ×10 IMPLANT
ELECT PENCIL ROCKER SW 15FT (MISCELLANEOUS) ×2 IMPLANT
ELECT REM PT RETURN 15FT ADLT (MISCELLANEOUS) ×3 IMPLANT
GLOVE BIO SURGEON STRL SZ 6 (GLOVE) ×12 IMPLANT
GLOVE BIO SURGEON STRL SZ 6.5 (GLOVE) ×2 IMPLANT
GLOVE BIO SURGEONS STRL SZ 6.5 (GLOVE) ×2
GOWN STRL REUS W/ TWL LRG LVL3 (GOWN DISPOSABLE) ×4 IMPLANT
GOWN STRL REUS W/TWL LRG LVL3 (GOWN DISPOSABLE) ×12
HEMOSTAT ARISTA ABSORB 3G PWDR (HEMOSTASIS) ×2 IMPLANT
HOLDER FOLEY CATH W/STRAP (MISCELLANEOUS) ×1 IMPLANT
IRRIG SUCT STRYKERFLOW 2 WTIP (MISCELLANEOUS) ×3
IRRIGATION SUCT STRKRFLW 2 WTP (MISCELLANEOUS) ×1 IMPLANT
KIT PROCEDURE DA VINCI SI (MISCELLANEOUS)
KIT PROCEDURE DVNC SI (MISCELLANEOUS) IMPLANT
KIT TURNOVER KIT A (KITS) IMPLANT
MANIPULATOR UTERINE 4.5 ZUMI (MISCELLANEOUS) ×1 IMPLANT
NDL HYPO 21X1.5 SAFETY (NEEDLE) ×1 IMPLANT
NDL SPNL 18GX3.5 QUINCKE PK (NEEDLE) IMPLANT
NEEDLE HYPO 21X1.5 SAFETY (NEEDLE) ×3 IMPLANT
NEEDLE SPNL 18GX3.5 QUINCKE PK (NEEDLE) IMPLANT
OBTURATOR OPTICAL STANDARD 8MM (TROCAR) ×3
OBTURATOR OPTICAL STND 8 DVNC (TROCAR) ×1
OBTURATOR OPTICALSTD 8 DVNC (TROCAR) ×1 IMPLANT
PACK ROBOT GYN WLCUSTOM (TRAY / TRAY PROCEDURE) ×3 IMPLANT
PAD POSITIONING PINK XL (MISCELLANEOUS) ×1 IMPLANT
PENCIL SMOKE EVACUATOR (MISCELLANEOUS) IMPLANT
PORT ACCESS TROCAR AIRSEAL 12 (TROCAR) ×1 IMPLANT
PORT ACCESS TROCAR AIRSEAL 5M (TROCAR) ×2
POUCH SPECIMEN RETRIEVAL 10MM (ENDOMECHANICALS) IMPLANT
SCRUB CHG 4% DYNA-HEX 4OZ (MISCELLANEOUS) ×1 IMPLANT
SEAL CANN UNIV 5-8 DVNC XI (MISCELLANEOUS) ×3 IMPLANT
SEAL XI 5MM-8MM UNIVERSAL (MISCELLANEOUS) ×9
SET TRI-LUMEN FLTR TB AIRSEAL (TUBING) ×3 IMPLANT
SPONGE LAP 18X18 RF (DISPOSABLE) IMPLANT
SURGIFLO W/THROMBIN 8M KIT (HEMOSTASIS) IMPLANT
SUT MNCRL AB 4-0 PS2 18 (SUTURE) ×4 IMPLANT
SUT PDS AB 1 TP1 96 (SUTURE) IMPLANT
SUT VIC AB 0 CT1 27 (SUTURE)
SUT VIC AB 0 CT1 27XBRD ANTBC (SUTURE) IMPLANT
SUT VIC AB 2-0 CT1 27 (SUTURE)
SUT VIC AB 2-0 CT1 TAPERPNT 27 (SUTURE) IMPLANT
SUT VICRYL 4-0 PS2 18IN ABS (SUTURE) ×6 IMPLANT
SYR 10ML LL (SYRINGE) IMPLANT
TOWEL OR NON WOVEN STRL DISP B (DISPOSABLE) ×3 IMPLANT
TRAP SPECIMEN MUCUS 40CC (MISCELLANEOUS) IMPLANT
TRAY FOLEY MTR SLVR 16FR STAT (SET/KITS/TRAYS/PACK) ×3 IMPLANT
TROCAR XCEL NON-BLD 5MMX100MML (ENDOMECHANICALS) IMPLANT
UNDERPAD 30X36 HEAVY ABSORB (UNDERPADS AND DIAPERS) ×3 IMPLANT
WATER STERILE IRR 1000ML POUR (IV SOLUTION) ×3 IMPLANT
YANKAUER SUCT BULB TIP 10FT TU (MISCELLANEOUS) IMPLANT

## 2019-10-21 NOTE — Anesthesia Procedure Notes (Signed)
Procedure Name: Intubation Date/Time: 10/21/2019 11:23 AM Performed by: Talbot Grumbling, CRNA Pre-anesthesia Checklist: Patient identified, Emergency Drugs available, Suction available and Patient being monitored Patient Re-evaluated:Patient Re-evaluated prior to induction Oxygen Delivery Method: Circle system utilized Preoxygenation: Pre-oxygenation with 100% oxygen Induction Type: IV induction Ventilation: Mask ventilation without difficulty Laryngoscope Size: Mac and 3 Grade View: Grade I Tube type: Oral Tube size: 7.0 mm Number of attempts: 1 Airway Equipment and Method: Stylet Placement Confirmation: ETT inserted through vocal cords under direct vision,  positive ETCO2 and breath sounds checked- equal and bilateral Secured at: 21 cm Tube secured with: Tape Dental Injury: Teeth and Oropharynx as per pre-operative assessment

## 2019-10-21 NOTE — Anesthesia Postprocedure Evaluation (Signed)
Anesthesia Post Note  Patient: Sherry Williams  Procedure(s) Performed: XI ROBOTIC PELVIC AND PARA-AORTIC LYMPH NODE DISSECTION (N/A )     Patient location during evaluation: PACU Anesthesia Type: General Level of consciousness: awake and alert Pain management: pain level controlled Vital Signs Assessment: post-procedure vital signs reviewed and stable Respiratory status: spontaneous breathing, nonlabored ventilation, respiratory function stable and patient connected to nasal cannula oxygen Cardiovascular status: blood pressure returned to baseline and stable Postop Assessment: no apparent nausea or vomiting Anesthetic complications: no    Last Vitals:  Vitals:   10/21/19 1515 10/21/19 1530  BP: 110/74 114/75  Pulse: (!) 55 (!) 58  Resp: 12 12  Temp: (!) 36.3 C   SpO2: 96% 97%    Last Pain:  Vitals:   10/21/19 1530  TempSrc:   PainSc: [place]

## 2019-10-21 NOTE — Brief Op Note (Signed)
10/21/2019  2:12 PM  PATIENT:  Annabell Howells  59 y.o. female  PRE-OPERATIVE DIAGNOSIS:  ABDOMINAL PELVIC LYMPHADENOPATHY,HISTORY OF ENDOMETRIAL CANCER  POST-OPERATIVE DIAGNOSIS:  ABDOMINAL PELVIC LYMPHADENOPATHY,HISTORY OF ENDOMETRIAL CANCER  PROCEDURE:  Procedure(s): XI ROBOTIC PELVIC AND PARA-AORTIC LYMPH NODE DISSECTION (N/A)  SURGEON:  Surgeon(s) and Role:    Lafonda Mosses, MD - Primary    * Lahoma Crocker, MD - Assisting   ANESTHESIA:   general  EBL:  50 mL   BLOOD ADMINISTERED:none  DRAINS: none   LOCAL MEDICATIONS USED:  MARCAINE     SPECIMEN:  Right pelvic, presacral and right para-aortic lymph nodes  DISPOSITION OF SPECIMEN:  PATHOLOGY  COUNTS:  YES  TOURNIQUET:  * No tourniquets in log *  DICTATION: .Note written in EPIC  PLAN OF CARE: Discharge to home after PACU  PATIENT DISPOSITION:  PACU - hemodynamically stable.   Delay start of Pharmacological VTE agent (>24hrs) due to surgical blood loss or risk of bleeding: not applicable

## 2019-10-21 NOTE — Anesthesia Preprocedure Evaluation (Signed)
Anesthesia Evaluation  Patient identified by MRN, date of birth, ID band Patient awake    Reviewed: Allergy & Precautions, NPO status , Patient's Chart, lab work & pertinent test results  Airway Mallampati: I  TM Distance: >3 FB Neck ROM: Full    Dental no notable dental hx. (+) Teeth Intact, Dental Advisory Given   Pulmonary    Pulmonary exam normal breath sounds clear to auscultation       Cardiovascular negative cardio ROS Normal cardiovascular exam Rhythm:Regular Rate:Normal     Neuro/Psych negative psych ROS   GI/Hepatic negative GI ROS, Neg liver ROS,   Endo/Other  Hypothyroidism   Renal/GU   negative genitourinary   Musculoskeletal   Abdominal Normal abdominal exam  (+)   Peds  Hematology   Anesthesia Other Findings   Reproductive/Obstetrics                             Anesthesia Physical  Anesthesia Plan  ASA: II  Anesthesia Plan: General   Post-op Pain Management:    Induction: Intravenous  PONV Risk Score and Plan: 4 or greater and Ondansetron, Dexamethasone, Scopolamine patch - Pre-op and Midazolam  Airway Management Planned: Oral ETT  Additional Equipment: None  Intra-op Plan:   Post-operative Plan: Extubation in OR  Informed Consent: I have reviewed the patients History and Physical, chart, labs and discussed the procedure including the risks, benefits and alternatives for the proposed anesthesia with the patient or authorized representative who has indicated his/her understanding and acceptance.     Dental advisory given  Plan Discussed with: CRNA  Anesthesia Plan Comments:         Anesthesia Quick Evaluation

## 2019-10-21 NOTE — Op Note (Signed)
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   Anesthesia: GET  Urine Output: 100cc  Operative Findings:  On abdominal entry, normal upper abdominal survey noted.  Normal-appearing omentum and small and large bowel.  Along the right sidewall, 2 nodules noted on the peritoneal surface concerning for hypermetabolic lesion seen on recent PET.  One lesion was intimately associated with the uterine artery remnant and the other overlying the obturator nerve.  A second was most consistent with lymphatic tissue.  Both sent for frozen section and returned showing significant necrosis of the tissue but appearance of adenocarcinoma, likely high-grade.  No other obvious pelvic adenopathy.  Prominent presacral and right para-aortic node visualized and also resected.  Para-aortic node somewhat adherent to IVC.  No other obvious pelvic or abdominal evidence of disease.  Estimated Blood Loss:  less than 50 mL      Total IV Fluids: 1300 ml         Specimens: Right pelvic lymph nodes (external iliac and obturator), presacral lymph node, right para-aortic lymph node         Complications:  None apparent; patient tolerated the procedure well.         Disposition: PACU - hemodynamically stable.  Procedure Details  The patient was seen in the Holding Room. The risks, benefits, complications, treatment  options, and expected outcomes were discussed with the patient.  The patient concurred with the proposed plan, giving informed consent.  The site of surgery properly noted/marked. The patient was identified as Sherry Williams and the procedure verified as a Robotic-assisted lymph node biopsy.   After induction of anesthesia, the patient was draped and prepped in the usual sterile manner. Patient was placed in supine position after anesthesia and draped and prepped in the usual sterile manner as follows: Her arms were tucked to her side with all appropriate precautions.  The shoulders were stabilized with padded shoulder blocks applied to the acromium processes.  The patient was placed in the semi-lithotomy position in [place].  The perineum and vagina were prepped with Betadine. The patient was draped after the CholoraPrep had been allowed to dry for 3 minutes.  A Time Out was held and the above information confirmed.  The urethra was prepped with Betadine. Foley catheter was placed.  OG tube placement was confirmed and to suction.   Next, a 10 mm skin incision was made 1 cm below the subcostal margin in the midclavicular line.  The 5 mm Optiview port and scope was used for direct entry.  Opening pressure was under 10 mm CO2.  The abdomen was insufflated and the findings were noted as above.   At this point and all points during the procedure, the patient's intra-abdominal pressure did not exceed 15 mmHg. Next, an 8 mm skin incision was made superior the umbilicus and a right and left port were placed about 8 cm lateral to the robot port on the right and left side.  A fourth arm was placed on the left. All robotic  arms were placed using prior incisions.  The 5 mm assist trocar was exchanged for a 10-12 mm port. All ports were placed under direct visualization.  The patient was placed in steep Trendelenburg.  Bowel was folded away into the upper abdomen.  The robot was docked in the normal manner.  The  right peritoneum was opened parallel to the external iliac artery to open the retroperitoneal space.  The ureter was visualized at the pelvic brim crossing the common iliac artery at the bifurcation to the external and internal iliac arteries.  Using the reporter of the genitofemoral nerve laterally, some lymphatic tissue was removed from the surface of the external iliac artery.  Two 1 cm nodules were then noted along the right pelvic sidewall, 1 intimately associated with the uterine artery pedicle and the other with the obturator nerve.  Sharp and blunt dissection were used to carefully dissect these to nodules from surrounding tissue.  The nodule associated with the uterine artery remnant appeared somewhat nodular with atypical vasculature.  The lesion near the obturator nerve was more lymphatic in appearance and drainage of somewhat necrotic appearing liquid was noted with manipulation of the lymph node.  No other obvious pelvic adenopathy noted.  There was a mildly enlarged presacral lymph node as well as a right lower para-aortic lymph node both felt to be prominent.  After pathology had reviewed the pelvic lymph nodes sent for frozen review and determined that there was adenocarcinoma, likely to be patient's prior uterine cancer although high-grade in appearance, decision was made to resect presacral and para-aortic lymph node to obtain hopefully more tissue if needed for stains or any further testing.  These lymph nodes were removed surrounding tissues, keeping the right ureter as well as aorta and IVC visualized at all times.  Irrigation was used and excellent hemostasis was achieved.  Given plan to restart therapeutic anticoagulation within several days after surgery, [place] was placed on all surgical beds.  At this point in the procedure was completed.  Robotic instruments were removed under direct visulaization.  The robot was undocked. The fascia at the 10-12 mm port was closed with 0 Vicryl on a  UR-5 needle.  The subcuticular tissue was closed with 4-0 Vicryl and the skin was closed with 4-0 Monocryl in a subcuticular manner.  Dermabond was applied.  Pressure dressing was applied to the incisions.  The foley catheter was removed. All sponge, lap and needle counts were correct x  3.   The patient was transferred to the recovery room in stable condition.  Jeral Pinch, MD

## 2019-10-21 NOTE — Discharge Instructions (Signed)
10/21/2019  For two days (5/12 and 5/13), use 2.5mg  (1/3rd of the injection) of your blood thinner. On Friday, 5/14, you can resume full dose of the fondaparinux.   Leave dressings on your incisions for 24hrs after surgery.  Return to work: 4-6 weeks  Activity: 1. Be up and out of the bed during the day.  Take a nap if needed.  You may walk up steps but be careful and use the hand rail.  Stair climbing will tire you more than you think, you may need to stop part way and rest.   2. No lifting or straining for 6 weeks.  3. No driving for 1-2 weeks.  Do Not drive if you are taking narcotic pain medicine and feel like you can brake safely.  4. Shower daily.  Use soap and water on your incision and pat dry; don't rub.   5. No sexual activity and nothing in the vagina for 4 weeks.  Medications:  - Take ibuprofen and tylenol first line for pain control. Take these regularly (every 6 hours) to decrease the build up of pain.  - If necessary, for severe pain not relieved by ibuprofen, take percocet.  - While taking percocet you should take sennakot every night to reduce the likelihood of constipation. If this causes diarrhea, stop its use.  Diet: 1. Low sodium Heart Healthy Diet is recommended.  2. It is safe to use a laxative if you have difficulty moving your bowels.   Wound Care: 1. Keep clean and dry.  Shower daily.  Reasons to call the Doctor:   Fever - Oral temperature greater than 100.4 degrees Fahrenheit  Foul-smelling vaginal discharge  Difficulty urinating  Nausea and vomiting  Increased pain at the site of the incision that is unrelieved with pain medicine.  Difficulty breathing with or without chest pain  New calf pain especially if only on one side  Sudden, continuing increased vaginal bleeding with or without clots.   Follow-up: 1. See Jeral Pinch in 3 weeks.  Contacts: For questions or concerns you should contact:  Dr. Jeral Pinch at  [phone] After hours and on week-ends call [phone] and ask to speak to the physician on call for Gynecologic Oncology   General Anesthesia, Adult, Care After This sheet gives you information about how to care for yourself after your procedure. Your health care provider may also give you more specific instructions. If you have problems or questions, contact your health care provider. What can I expect after the procedure? After the procedure, the following side effects are common:  Pain or discomfort at the IV site.  Nausea.  Vomiting.  Sore throat.  Trouble concentrating.  Feeling cold or chills.  Weak or tired.  Sleepiness and fatigue.  Soreness and body aches. These side effects can affect parts of the body that were not involved in surgery. Follow these instructions at home:  For at least 24 hours after the procedure:  Have a responsible adult stay with you. It is important to have someone help care for you until you are awake and alert.  Rest as needed.  Do not: ? Participate in activities in which you could fall or become injured. ? Drive. ? Use heavy machinery. ? Drink alcohol. ? Take sleeping pills or medicines that cause drowsiness. ? Make important decisions or sign legal documents. ? Take care of children on your own. Eating and drinking  Follow any instructions from your health care provider about eating or drinking  restrictions.  When you feel hungry, start by eating small amounts of foods that are soft and easy to digest (bland), such as toast. Gradually return to your regular diet.  Drink enough fluid to keep your urine pale yellow.  If you vomit, rehydrate by drinking water, juice, or clear broth. General instructions  If you have sleep apnea, surgery and certain medicines can increase your risk for breathing problems. Follow instructions from your health care provider about wearing your sleep device: ? Anytime you are sleeping, including  during daytime naps. ? While taking prescription pain medicines, sleeping medicines, or medicines that make you drowsy.  Return to your normal activities as told by your health care provider. Ask your health care provider what activities are safe for you.  Take over-the-counter and prescription medicines only as told by your health care provider.  If you smoke, do not smoke without supervision.  Keep all follow-up visits as told by your health care provider. This is important. Contact a health care provider if:  You have nausea or vomiting that does not get better with medicine.  You cannot eat or drink without vomiting.  You have pain that does not get better with medicine.  You are unable to pass urine.  You develop a skin rash.  You have a fever.  You have redness around your IV site that gets worse. Get help right away if:  You have difficulty breathing.  You have chest pain.  You have blood in your urine or stool, or you vomit blood. Summary  After the procedure, it is common to have a sore throat or nausea. It is also common to feel tired.  Have a responsible adult stay with you for the first 24 hours after general anesthesia. It is important to have someone help care for you until you are awake and alert.  When you feel hungry, start by eating small amounts of foods that are soft and easy to digest (bland), such as toast. Gradually return to your regular diet.  Drink enough fluid to keep your urine pale yellow.  Return to your normal activities as told by your health care provider. Ask your health care provider what activities are safe for you. This information is not intended to replace advice given to you by your health care provider. Make sure you discuss any questions you have with your health care provider. Document Revised: 06/01/2017 Document Reviewed: 01/12/2017 Elsevier Patient Education  [place].

## 2019-10-21 NOTE — Interval H&P Note (Signed)
History and Physical Interval Note:  10/21/2019 10:32 AM  Sherry Williams  has presented today for surgery, with the diagnosis of ABDOMINAL PELVIC LYMPHADENOPATHY,HISTORY OF ENDOMETRIAL CANCER.  The various methods of treatment have been discussed with the patient and family. After consideration of risks, benefits and other options for treatment, the patient has consented to  Procedure(s): XI ROBOTIC PELVIC AND PARA-AORTIC LYMPH NODE DISSECTION POSSIBLE LAPAROTOMY AND POSSIBLE TUMOR DEBULKING180 (N/A) as a surgical intervention.  The patient's history has been reviewed, patient examined, no change in status, stable for surgery.  I have reviewed the patient's chart and labs.  Questions were answered to the patient's satisfaction.     Lafonda Mosses

## 2019-10-21 NOTE — Progress Notes (Signed)
Postop meds prescribed. 

## 2019-10-21 NOTE — Transfer of Care (Signed)
Immediate Anesthesia Transfer of Care Note  Patient: Sherry Williams  Procedure(s) Performed: XI ROBOTIC PELVIC AND PARA-AORTIC LYMPH NODE DISSECTION (N/A )  Patient Location: PACU  Anesthesia Type:General  Level of Consciousness: sedated  Airway & Oxygen Therapy: Patient Spontanous Breathing and Patient connected to face mask oxygen  Post-op Assessment: Report given to RN and Post -op Vital signs reviewed and stable  Post vital signs: Reviewed and stable  Last Vitals:  Vitals Value Taken Time  BP 131/82 10/21/19 1415  Temp    Pulse 65 10/21/19 1416  Resp 13 10/21/19 1416  SpO2 100 % 10/21/19 1416  Vitals shown include unvalidated device data.  Last Pain:  Vitals:   10/21/19 0920  TempSrc: Oral         Complications: No apparent anesthesia complications

## 2019-10-22 ENCOUNTER — Telehealth: Payer: Self-pay | Admitting: *Deleted

## 2019-10-22 ENCOUNTER — Telehealth: Payer: Self-pay

## 2019-10-22 NOTE — Telephone Encounter (Signed)
Sherry Williams states that she is eating, drinking, and urinating well.  She has passed gas. She does not want to begin senokot-s today as she has not needed to use Oxycodone. Told her to take tomorrow if she has not had a BM as post op pain can decrease desire to bear down for a BM. Pt verbalized understanding.  Incisions are D&I Pain controlled with homeopathic medications. Pt aware of change of in office visit from 11-11-19 to 11-14-19 at 1130. Reviewed resuming the Arixtra today and tomorrow at 2.5 mg and then resume full dose on Friday 10-24-19 per Dr. Berline Lopes. She can resume her ASA 81 mg tab today . She knows the office number [phone] to call with any questions or concerns.

## 2019-10-22 NOTE — Telephone Encounter (Signed)
Louise RN gave the patient the new appt date/time

## 2019-10-23 ENCOUNTER — Encounter: Payer: Self-pay | Admitting: Family Medicine

## 2019-10-23 ENCOUNTER — Telehealth: Payer: Self-pay | Admitting: Hematology & Oncology

## 2019-10-23 ENCOUNTER — Telehealth: Payer: Self-pay | Admitting: Gynecologic Oncology

## 2019-10-23 NOTE — Telephone Encounter (Signed)
Called the patient and reviewed in detail her final pathology from surgery on Tuesday.  There are several clarifications that I need to make with pathology most importantly that the first specimen was a pelvic sidewall nodule and uterine artery remnant.  We discussed other possible stains and molecular testing that may be performed on the tumor and that I am going to recommend some sort of systemic therapy.  The patient has significant concerns about traditional chemotherapy and we talked about the fact that she may be a candidate for other therapy such as immunotherapy or targeted therapy.  I will also reach out to her heme-onc within the [facility] system, Dr. Marin Olp, as well as Dr. Joan Flores at [facility].  Jeral Pinch MD Gynecologic Oncology

## 2019-10-23 NOTE — Telephone Encounter (Signed)
Called LMVM for patient regarding appointments added.  I did ask tht she call back to confirm date/time

## 2019-10-24 ENCOUNTER — Encounter: Payer: Self-pay | Admitting: *Deleted

## 2019-10-24 ENCOUNTER — Encounter: Payer: Self-pay | Admitting: Gynecologic Oncology

## 2019-10-24 ENCOUNTER — Telehealth: Payer: Self-pay | Admitting: Gynecologic Oncology

## 2019-10-24 ENCOUNTER — Encounter: Payer: Self-pay | Admitting: Oncology

## 2019-10-24 NOTE — Progress Notes (Signed)
[facility] sent per order of Dr. Ennever.   °

## 2019-10-24 NOTE — Progress Notes (Signed)
Requested Her2 and tumor molecular send out testing on accession [id number] with [facility] via email.

## 2019-10-24 NOTE — Telephone Encounter (Signed)
Spoke with the patient again today.  Discussed case with pathologist to read specimens from Tuesday surgery and explained issue with mislabeling on the first specimen that should have read right pelvic sidewall nodule and uterine artery remnant.  Based on our discussion, this would most likely then be a recurrence of her endometrial cancer.  No adnexal structures were left and findings at the time of surgery did not resemble peritoneal disease which one would expect if this were of ovarian, fallopian tube or primary peritoneal origin.  Explained again that there are some additional tests pending, some of which will happen here in house (progesterone receptor, HER-2, MMR) and others which will be sent out (molecular testing).  We also reviewed that her case will be presented at our tumor conference in a week and a half.  I have sent messages to both her heme-onc here at [facility], Dr. Marin Olp, as well as her heme-onc at [facility], Dr. Joan Flores.  All questions answered.  Jeral Pinch MD Gynecologic Oncology

## 2019-10-28 ENCOUNTER — Ambulatory Visit: Payer: 59 | Admitting: Gynecologic Oncology

## 2019-10-29 ENCOUNTER — Other Ambulatory Visit: Payer: Self-pay

## 2019-10-29 DIAGNOSIS — R768 Other specified abnormal immunological findings in serum: Secondary | ICD-10-CM

## 2019-10-30 ENCOUNTER — Encounter: Payer: Self-pay | Admitting: Hematology & Oncology

## 2019-10-30 LAB — SURGICAL PATHOLOGY

## 2019-10-31 ENCOUNTER — Other Ambulatory Visit: Payer: Self-pay

## 2019-10-31 ENCOUNTER — Inpatient Hospital Stay (HOSPITAL_BASED_OUTPATIENT_CLINIC_OR_DEPARTMENT_OTHER): Payer: 59 | Admitting: Hematology & Oncology

## 2019-10-31 ENCOUNTER — Encounter: Payer: Self-pay | Admitting: Hematology & Oncology

## 2019-10-31 ENCOUNTER — Inpatient Hospital Stay: Payer: 59

## 2019-10-31 VITALS — BP 133/80 | HR 67 | Temp 97.8°F | Resp 18 | Wt 138.0 lb

## 2019-10-31 DIAGNOSIS — I63019 Cerebral infarction due to thrombosis of unspecified vertebral artery: Secondary | ICD-10-CM

## 2019-10-31 DIAGNOSIS — I82401 Acute embolism and thrombosis of unspecified deep veins of right lower extremity: Secondary | ICD-10-CM

## 2019-10-31 DIAGNOSIS — C541 Malignant neoplasm of endometrium: Secondary | ICD-10-CM

## 2019-10-31 DIAGNOSIS — C801 Malignant (primary) neoplasm, unspecified: Secondary | ICD-10-CM | POA: Diagnosis not present

## 2019-10-31 LAB — CMP (CANCER CENTER ONLY)
ALT: 9 U/L (ref 0–44)
AST: 15 U/L (ref 15–41)
Albumin: 4.3 g/dL (ref 3.5–5.0)
Alkaline Phosphatase: 54 U/L (ref 38–126)
Anion gap: 7 (ref 5–15)
BUN: 14 mg/dL (ref 6–20)
CO2: 30 mmol/L (ref 22–32)
Calcium: 10.1 mg/dL (ref 8.9–10.3)
Chloride: 104 mmol/L (ref 98–111)
Creatinine: 0.88 mg/dL (ref 0.44–1.00)
GFR, Est AFR Am: >60 mL/min (ref >60)
GFR, Estimated: >60 mL/min (ref >60)
Glucose, Bld: 87 mg/dL (ref 70–99)
Potassium: 4.1 mmol/L (ref 3.5–5.1)
Sodium: 141 mmol/L (ref 135–145)
Total Bilirubin: 0.5 mg/dL (ref 0.3–1.2)
Total Protein: 7.2 g/dL (ref 6.5–8.1)

## 2019-10-31 LAB — CBC WITH DIFFERENTIAL (CANCER CENTER ONLY)
Abs Immature Granulocytes: 0.02 10*3/uL (ref 0.00–0.07)
Basophils Absolute: 0.1 10*3/uL (ref 0.0–0.1)
Basophils Relative: 1 %
Eosinophils Absolute: 0.2 10*3/uL (ref 0.0–0.5)
Eosinophils Relative: 5 %
HCT: 39.4 % (ref 36.0–46.0)
Hemoglobin: 12.9 g/dL (ref 12.0–15.0)
Immature Granulocytes: 1 %
Lymphocytes Relative: 32 %
Lymphs Abs: 1.3 10*3/uL (ref 0.7–4.0)
MCH: 28.6 pg (ref 26.0–34.0)
MCHC: 32.7 g/dL (ref 30.0–36.0)
MCV: 87.4 fL (ref 80.0–100.0)
Monocytes Absolute: 0.5 10*3/uL (ref 0.1–1.0)
Monocytes Relative: 11 %
Neutro Abs: 2.1 10*3/uL (ref 1.7–7.7)
Neutrophils Relative %: 50 %
Platelet Count: 277 10*3/uL (ref 150–400)
RBC: 4.51 MIL/uL (ref 3.87–5.11)
RDW: 12.6 % (ref 11.5–15.5)
WBC Count: 4.1 10*3/uL (ref 4.0–10.5)
nRBC: 0 % (ref 0.0–0.2)

## 2019-10-31 LAB — D-DIMER, QUANTITATIVE: D-Dimer, Quant: 0.59 ug/mL-FEU — ABNORMAL HIGH (ref 0.00–0.50)

## 2019-10-31 NOTE — Progress Notes (Signed)
Hematology and Oncology Follow Up Visit  Sherry Williams [id number] [birth year] 59 y.o. 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  She successfully underwent laparoscopic exploration by Dr. Berline Lopes on 10/21/2019.  Multiple biopsies were taken down in the pelvis.  The pathology report ([id number]) showed a high-grade carcinoma that appeared to be of gynecologic origin.  This was a totally separate and distinct from the endometrial cancer that she had back in 2018.  There was no HER-2 expression.  There is no progesterone or estrogen expression.  The tumor was MSI and MMR stable.  I suppose that this is the etiology for these thrombolic events that she has.  She has gone through the surgery without any issues with thromboembolism.  She is on Arixtra.  We are sending off the tissue for molecular analysis.  Sherry Williams is one that wishes to consider complementary therapy.  She is probably going to think about an holistic approach for her malignancy.  She looks fantastic.  She has recovered quite nicely.  She is walking.  She comes in with her mom.  It was fun talking to her mom who just moved down from [place].  There is no obvious family history of gynecologic cancer in her family.  I think there might be a history of breast cancer in a paternal grandmother.  She has had no issues with cough or shortness of breath.  There is no leg swelling.  There is no rashes.  There is no headache.   Currently, her performance status is ECOG 1.     Medications:  Current Outpatient Medications:  .  rosuvastatin (CRESTOR) 40 MG tablet, Take 40 mg by mouth daily. Take 1/2 tablet, total of 20 mg by mouth daily., Disp: , Rfl:  .  ARMOUR THYROID 60 MG  tablet, TAKE 1 TABLET BY MOUTH EVERY DAY BEFORE BREAKFAST. TAKE 1 DAILY 5 DAYS PER WEEK (Patient taking differently: Take 60 mg by mouth See admin instructions. Taking 53m 5 days a week), Disp: 90 tablet, Rfl: 2 .  ARMOUR THYROID 90 MG tablet, TAKE 1 TABLET BY MOUTH EVERY DAY TWO DAYS PER WEEK (Patient taking differently: Take 90 mg by mouth See admin instructions. Taking 948m2 days a week  ( could not provide schedule)), Disp: 90 tablet, Rfl: 1 .  ASPIRIN 81 PO, Take by mouth daily., Disp: , Rfl:  .  fondaparinux (ARIXTRA) 7.5 MG/0.6ML SOLN injection, Inject 0.6 mLs (7.5 mg total) into the skin daily., Disp: 18 mL, Rfl: 3 .  oxyCODONE (OXY IR/ROXICODONE) 5 MG immediate release tablet, Take 1 tablet (5 mg total) by mouth every 4 (four) hours as needed for severe pain. For AFTER surgery, do not take and drive, Disp: 10 tablet, Rfl: 0 .  senna-docusate (SENOKOT-S) 8.6-50 MG tablet, Take 2 tablets by mouth at bedtime. For AFTER surgery, do not take if having diarrhea, Disp: 30 tablet, Rfl: 0  Allergies:  Allergies  Allergen Reactions  . Bee Venom Anaphylaxis and Swelling  . Adhesive [Tape] Other (See Comments)    Irritation and red  . Aspartame Diarrhea  . Aspartame And Phenylalanine Diarrhea    Past Medical History, Surgical history, Social history, and Family History were reviewed and  updated.  Review of Systems: Review of Systems  Constitutional: Negative.   HENT: Negative.   Eyes: Negative.   Respiratory: Negative.   Cardiovascular: Negative.   Gastrointestinal: Positive for abdominal pain.  Genitourinary: Negative.   Musculoskeletal: Negative.   Skin: Negative.   Neurological: Negative.   Endo/Heme/Allergies: Negative.   Psychiatric/Behavioral: Negative.     Physical Exam:  weight is 138 lb (62.6 kg). Her temporal temperature is 97.8 F (36.6 C). Her blood pressure is 133/80 and her pulse is 67. Her respiration is 18 and oxygen saturation is 100%.   Wt Readings from Last 3  Encounters:  10/31/19 138 lb (62.6 kg)  10/21/19 137 lb (62.1 kg)  10/16/19 137 lb (62.1 kg)    Physical Exam Vitals reviewed.  HENT:     Head: Normocephalic and atraumatic.  Eyes:     Pupils: Pupils are equal, round, and reactive to light.  Cardiovascular:     Rate and Rhythm: Normal rate and regular rhythm.     Heart sounds: Normal heart sounds.  Pulmonary:     Effort: Pulmonary effort is normal.     Breath sounds: Normal breath sounds.  Abdominal:     General: Bowel sounds are normal.     Palpations: Abdomen is soft.     Comments: Abdominal exam shows a soft abdomen.  She has well healing laparoscopic scars.  I think there are 5 laparoscopic scars.  She has no erythema or exudate or warmth associated with these.  She has no fluid wave in the abdomen.  There is no guarding or rebound tenderness.  She has no palpable liver or spleen tip.  Musculoskeletal:        General: No tenderness or deformity. Normal range of motion.     Cervical back: Normal range of motion.  Lymphadenopathy:     Cervical: No cervical adenopathy.  Skin:    General: Skin is warm and dry.     Findings: No erythema or rash.  Neurological:     Mental Status: She is alert and oriented to person, place, and time.  Psychiatric:        Behavior: Behavior normal.        Thought Content: Thought content normal.        Judgment: Judgment normal.      Lab Results  Component Value Date   WBC 4.1 10/31/2019   HGB 12.9 10/31/2019   HCT 39.4 10/31/2019   MCV 87.4 10/31/2019   PLT 277 10/31/2019     Chemistry      Component Value Date/Time   NA 141 10/31/2019 0752   NA 142 02/15/2017 1518   NA 140 04/13/2016 1055   K 4.1 10/31/2019 0752   K 4.1 02/15/2017 1518   K 4.1 04/13/2016 1055   CL 104 10/31/2019 0752   CL 107 02/15/2017 1518   CO2 30 10/31/2019 0752   CO2 29 02/15/2017 1518   CO2 27 04/13/2016 1055   BUN 14 10/31/2019 0752   BUN 17 02/15/2017 1518   BUN 22.1 04/13/2016 1055   CREATININE  0.88 10/31/2019 0752   CREATININE 1.0 02/15/2017 1518   CREATININE 0.8 04/13/2016 1055      Component Value Date/Time   CALCIUM 10.1 10/31/2019 0752   CALCIUM 9.3 02/15/2017 1518   CALCIUM 9.9 04/13/2016 1055   ALKPHOS 54 10/31/2019 0752   ALKPHOS 79 02/15/2017 1518   ALKPHOS 81 04/13/2016 1055   AST 15 10/31/2019 0752   AST 20 04/13/2016 1055  ALT 9 10/31/2019 0752   ALT 24 02/15/2017 1518   ALT 22 04/13/2016 1055   BILITOT 0.5 10/31/2019 0752   BILITOT 0.44 04/13/2016 1055      Impression and Plan: Ms. Pies is a 59 year old Caucasian female.  She has had issues with recurrence of thromboembolic events.  She has had these systemically and in the brain.  I am quite surprised by the pathologic report.  This is clearly a high-grade malignancy.  I suppose this could be from endometrial cancer that she had although it was only stage I and low-grade.  I believe that the standard of care definitely would be systemic chemotherapy.  However, Ms. Denny really would like to avoid chemotherapy if she could.  She truly feels that a holistic approach would be the way to go for her.  I certainly will respect her wishes.  She does a lot of research.  I think she does see a holistic doctor who will try to help her out.  The molecular analysis of her cancer will be critical.  We will see if there might be a BRCA mutation.  We will see if there is any PD-L1 expression.  We will see if there is any mutation that we might be able to target without having to use chemotherapy.  I spent 45 minutes or so with she and her mom.  Her husband was on the cell phone listening and.  I went over the pathology report.  I went over my recommendations.  I am glad that she is doing well with the Arixtra and has had no issues with thromboembolic events.  Once we get the results back from the molecular studies, I will be in touch with her.  She will certainly do her research in holistic approaches to  cancer.  [name], MD 5/21/20218:45 AM

## 2019-11-03 ENCOUNTER — Encounter: Payer: Self-pay | Admitting: Gynecologic Oncology

## 2019-11-03 ENCOUNTER — Other Ambulatory Visit: Payer: Self-pay | Admitting: Oncology

## 2019-11-03 NOTE — Telephone Encounter (Signed)
No los 5/21  

## 2019-11-03 NOTE — Progress Notes (Signed)
Gynecologic Oncology Multi-Disciplinary Disposition Conference Note  Date of the Conference: 11/03/2019  Patient Name: Sherry Williams  Primary GYN Oncologist: Dr. Berline Lopes  Stage/Disposition:  Recurrent stage IA, grade 1 endometrioid adenocarcinoma. Disposition is to treatment depending on molecular testing results.   This Multidisciplinary conference took place involving physicians from [place], [place], Radiation Oncology, Pathology, Radiology along with the Gynecologic Oncology Nurse Practitioner and RN.  Comprehensive assessment of the patient's malignancy, staging, need for surgery, chemotherapy, radiation therapy, and need for further testing were reviewed. Supportive measures, both inpatient and following discharge were also discussed. The recommended plan of care is documented. Greater than 35 minutes were spent correlating and coordinating this patient's care.

## 2019-11-06 ENCOUNTER — Encounter: Payer: Self-pay | Admitting: Family Medicine

## 2019-11-06 ENCOUNTER — Other Ambulatory Visit: Payer: Self-pay | Admitting: Family Medicine

## 2019-11-06 DIAGNOSIS — I4891 Unspecified atrial fibrillation: Secondary | ICD-10-CM

## 2019-11-06 DIAGNOSIS — I639 Cerebral infarction, unspecified: Secondary | ICD-10-CM

## 2019-11-06 DIAGNOSIS — Z8673 Personal history of transient ischemic attack (TIA), and cerebral infarction without residual deficits: Secondary | ICD-10-CM

## 2019-11-11 ENCOUNTER — Ambulatory Visit: Payer: 59 | Admitting: Gynecologic Oncology

## 2019-11-12 NOTE — Progress Notes (Signed)
Gynecologic Oncology Return Clinic Visit  11/14/19  Reason for Visit: post-operative follow and treatment discussion  Treatment History: Oncology History Overview Note  Recurrence MSI-stable, PR and HER2 neg   Endometrial cancer ([place])  04/17/2017 Initial Diagnosis   Endometrial cancer ([place])   04/17/2017 Surgery   TRH/BSO, SLN bx   04/17/2017 Pathologic Stage   IA grade 1 EMCA Negative SLNs, negative LVSI   10/21/2019 Surgery   Robotic-assisted laparoscopic pelvic and para-aortic lymph node sampling  On abdominal entry, normal upper abdominal survey noted.  Normal-appearing omentum and small and large bowel.  Along the right sidewall, 2 nodules noted on the peritoneal surface concerning for hypermetabolic lesion seen on recent PET.  One lesion was intimately associated with the uterine artery remnant and the other overlying the obturator nerve.  A second was most consistent with lymphatic tissue.  Both sent for frozen section and returned showing significant necrosis of the tissue but appearance of adenocarcinoma, likely high-grade.  No other obvious pelvic adenopathy.  Prominent presacral and right para-aortic node visualized and also resected.  Para-aortic node somewhat adherent to IVC.  No other obvious pelvic or abdominal evidence of disease.   10/21/2019 Relapse/Recurrence   A. RIGHT PELVIC SIDEWALL NODULE AND UTERINE ARTERY REMNANT, BIOPSY:  - High grade carcinoma focally involving fibrous and adipose tissue.   B. LYMPH NODE, RIGHT OBTURATOR, BIOPSY:  - Metastatic carcinoma in (1) of (1) lymph node.   C. LYMPH NODE, RIGHT EXTERNAL ILIAC, BIOPSY:  - Metastatic carcinoma in (1) of (1) lymph node.   D. LYMPH NODE, PRESACRAL, BIOPSY:  - Metastatic carcinoma in (1) of (1) lymph node.   E. LYMPH NODE, RIGHT PARA-AORTIC, BIOPSY:  - Metastatic carcinoma in (1) of (1) lymph node.   F. RIGHT SIDEWALL, BIOPSY:  - High grade carcinoma involving fibrous tissue.        Interval  History: Patient reports overall doing well since surgery. She is still experiencing some fatigue and abdominal bloating. She is increasing her activity and walking most days. She reports regular bowel and bladder function. Endorses having a good appetite without nausea or emesis.   Past Medical/Surgical History: Past Medical History:  Diagnosis Date  . Allergy   . Arthritis   . DVT (deep venous thrombosis) ([place])    lower extremity   . Endometrial ca ([place]) 03/14/2017   uterine cancer 2018  . Family history of adverse reaction to anesthesia    children had N/V   . History of kidney stones   . Hypothyroidism    Hashimoto  . Lichen sclerosus   . MRSA (methicillin resistant staph aureus) culture positive   . Pneumonia    walking  . Pulmonary embolism ([place]) 01/13/2016   After car trip  . Stroke [facility])    multiple     2-16 -2021, 4-15- 2021 no defecits   do to blood clots    Past Surgical History:  Procedure Laterality Date  . ABDOMINAL HYSTERECTOMY    . BUBBLE STUDY  07/31/2019   Procedure: BUBBLE STUDY;  Surgeon: [name], MD;  Location: [place];  Service: Cardiovascular;;  . CYSTOSCOPY WITH RETROGRADE PYELOGRAM, URETEROSCOPY AND STENT PLACEMENT Left 05/18/2018   Procedure: CYSTOSCOPY WITH RETROGRADE PYELOGRAM, LEFT URETEROSCOPY AND LEFT URETERAL STENT PLACEMENT;  Surgeon: [name], MD;  Location: [facility];  Service: Urology;  Laterality: Left;  . CYSTOSCOPY WITH RETROGRADE PYELOGRAM, URETEROSCOPY AND STENT PLACEMENT Right 05/30/2018   Procedure: CYSTOSCOPY WITH RIGHT RETROGRADE PYELOGRAM, URETEROSCOPY LASER LITHOTRIPSY AND  STENT PLACEMENT;  Surgeon: [name], MD;  Location: [facility];  Service: Urology;  Laterality: Right;  . HOLMIUM LASER APPLICATION Left 99/01/3381   Procedure: HOLMIUM LASER APPLICATION;  Surgeon: [name], MD;  Location: [facility];  Service: Urology;  Laterality: Left;  . HOLMIUM LASER APPLICATION Right  50/53/9767   Procedure: HOLMIUM LASER APPLICATION;  Surgeon: [name], MD;  Location[facility]  Service: Urology;  Laterality: Right;  . MOUTH SURGERY    . ROBOTIC ASSISTED TOTAL HYSTERECTOMY WITH BILATERAL SALPINGO OOPHERECTOMY Bilateral 04/17/2017   Procedure: XI ROBOTIC ASSISTED TOTAL HYSTERECTOMY WITH BILATERAL SALPINGO OOPHORECTOMY WITH SENTINAL LYMPH NODE BIOPSY AND POSSIBLE LYMPHADENECTOMY;  Surgeon: [name], MD;  Location: [facility];  Service: Gynecology;  Laterality: Bilateral;  . ROBOTIC PELVIC AND PARA-AORTIC LYMPH NODE DISSECTION N/A 10/21/2019   Procedure: XI ROBOTIC PELVIC AND PARA-AORTIC LYMPH NODE DISSECTION;  Surgeon: Lafonda Mosses, MD;  Location: [facility];  Service: Gynecology;  Laterality: N/A;  . TEE WITHOUT CARDIOVERSION N/A 07/31/2019   Procedure: TRANSESOPHAGEAL ECHOCARDIOGRAM (TEE);  Surgeon: [name], MD;  Location: [facility];  Service: Cardiovascular;  Laterality: N/A;    Family History  Problem Relation Age of Onset  . Stroke Father   . Alzheimer's disease Father   . Hyperlipidemia Father   . Prostate cancer Father   . Cancer Father        in his nose cartiage  . Deep vein thrombosis Sister   . Other Sister        celiac sprue  . Breast cancer Paternal Grandmother        In her 43s, but lived to be very elderly  . Arthritis Mother   . Lung cancer Maternal Grandmother   . Diabetes Paternal Grandfather   . Pancreatic cancer Paternal Grandfather     Social History   Socioeconomic History  . Marital status: Married    Spouse name: Not on file  . Number of children: 4  . Years of education: Not on file  . Highest education level: Not on file  Occupational History  . Occupation: Press photographer  Tobacco Use  . Smoking status: Never Smoker  . Smokeless tobacco: Never Used  Substance and Sexual Activity  . Alcohol use: Yes    Alcohol/week: 1.0 standard drinks    Types: 1 Glasses of wine per week    Comment:  occassional  . Drug use: No  . Sexual activity: Yes    Birth control/protection: Surgical  Other Topics Concern  . Not on file  Social History Narrative  . Not on file   Social Determinants of Health   Financial Resource Strain:   . Difficulty of Paying Living Expenses:   Food Insecurity:   . Worried About Charity fundraiser in the Last Year:   . Arboriculturist in the Last Year:   Transportation Needs:   . Film/video editor (Medical):   Marland Kitchen Lack of Transportation (Non-Medical):   Physical Activity:   . Days of Exercise per Week:   . Minutes of Exercise per Session:   Stress:   . Feeling of Stress :   Social Connections:   . Frequency of Communication with Friends and Family:   . Frequency of Social Gatherings with Friends and Family:   . Attends Religious Services:   . Active Member of Clubs or Organizations:   . Attends Archivist Meetings:   Marland Kitchen Marital Status:  Current Medications:  Current Outpatient Medications:  .  ARMOUR THYROID 60 MG tablet, TAKE 1 TABLET BY MOUTH EVERY DAY BEFORE BREAKFAST. TAKE 1 DAILY 5 DAYS PER WEEK (Patient taking differently: Take 60 mg by mouth See admin instructions. Taking 3m 5 days a week), Disp: 90 tablet, Rfl: 2 .  ARMOUR THYROID 90 MG tablet, TAKE 1 TABLET BY MOUTH EVERY DAY TWO DAYS PER WEEK (Patient taking differently: Take 90 mg by mouth See admin instructions. Taking 920m2 days a week  ( could not provide schedule)), Disp: 90 tablet, Rfl: 1 .  ASPIRIN 81 PO, Take by mouth daily., Disp: , Rfl:  .  rosuvastatin (CRESTOR) 40 MG tablet, Take 40 mg by mouth daily. Take 1/2 tablet, total of 20 mg by mouth daily., Disp: , Rfl:  .  fondaparinux (ARIXTRA) 7.5 MG/0.6ML SOLN injection, Inject 0.6 mLs (7.5 mg total) into the skin daily., Disp: 18 mL, Rfl: 3  Review of Systems: Denies appetite changes, fevers, chills, fatigue, unexplained weight changes. Denies hearing loss, neck lumps or masses, mouth sores, ringing in ears  or voice changes. Denies cough or wheezing.  Denies shortness of breath. Denies chest pain or palpitations. Denies leg swelling. Denies abdominal distention, pain, blood in stools, constipation, diarrhea, nausea, vomiting, or early satiety. Denies pain with intercourse, dysuria, frequency, hematuria or incontinence. Denies hot flashes, pelvic pain, vaginal bleeding or vaginal discharge.   Denies joint pain, back pain or muscle pain/cramps. Denies itching, rash, or wounds. Denies dizziness, headaches, numbness or seizures. Denies swollen lymph nodes or glands, denies easy bruising or bleeding. Denies anxiety, depression, confusion, or decreased concentration.  Physical Exam: BP 99/80 (BP Location: Right Arm, Patient Position: Sitting)   Pulse 78   Temp 97.8 F (36.6 C) (Temporal)   Resp 17   Ht _0  (1.676 m)   Wt 138 lb 9.6 oz (62.9 kg)   LMP 09/04/2012   SpO2 100%   BMI 22.37 kg/m  General: Alert, oriented, no acute distress. HEENT: Normocephalic, atraumatic, sclera anicteric. Chest: Unlabored breathing on room air. . Abdomen: soft, nontender.  Normoactive bowel sounds.  No masses or hepatosplenomegaly appreciated.  Well-healing robotic incisions. Extremities: Grossly normal range of motion.  Warm, well perfused.  No edema bilaterally.  Laboratory & Radiologic Studies: None new  Assessment & Plan: TeToriannyCarman Auxiers a [age].o. woman with a history of low-risk early-stage endometrial cancer now with metastatic high grade carcinoma 3 weeks s/p robotic lymph node dissection.   The patient continues to recover well from surgery and is meeting all milestones. We discussed continued activity restrictions for another week.  I spent approximately 50 minutes in face to face discussion with Lalia and her husband, discussing the disease process and answering her questions about prognosis and treatment options. We reviewed her pathology from recent surgery again which shows a high grade  carcinoma of GYN origin. Based on my findings at the time of surgery, this seems to me much more likely to be recurrent endometrial cancer. I think most likely there was a small component of the original tumor that may have had a high-grade component or this has evolved with time from her low grade cancer. While less likely in my opinion, this could be a new GYN primary malignancy, such as a high grade carcinoma of ovarian/fallopian tube/primary peritoneal origin.  Te[place]annaas significant concerns about cytotoxic chemotherapy, which she is able to articulate very clearly. She is most interested in alternative approaches to treatment of her  cancer although was open to a discussion today that largely focused on traditional chemotherapy. Whether we are calling this recurrent and metastatic high grade uterine cancer or a new high grade carcinoma of GYN origin, we discussed that carboplatin and paclitaxel. I gave her patient handouts on both chemotherapy agents. She very much desires to have information about success rates and overall survival with chemotherapy. I reviewed the difficulty in giving her concrete numbers given the difficulty in defining her diagnosis. Since she is chemotherapy naive, I think that her response is likely closer to the response we would see in the upfront setting of newly diagnosed high grade carcinoma. We discussed data that would point to a response rate (including partial and complete response) of 40-60% in the setting of high grade endometrial cancer and 60-70% in the setting of a grade grade carcinoma in the ovarian/FT/primary peritoneal family. I think her response may be somewhat less than these numbers again because I think this is recurrent disease, albeit chemonaive.  We discussed in details some of the more quality of life affecting side effects as well as the ones that we monitor frequently during treatment. I reviewed the modifications (dose reduction and cycle delay, change in  chemotherapy agent, dropping an agent) that can be made if significant toxicity is seen.  We are waiting on molecular testing still. Her tumor stains were negative for ER, PR and HER2. The tumor is MSI-stable. I think its unlikely her molecular testing will give [place] a targetable mutation, which I expressed to Charnese and her husband today.  I will reach out to Harmon with any information about possible clinical trials that may be open at [facility].   80 minutes of total time was spent for this patient encounter, including preparation, face-to-face counseling with the patient and coordination of care, and documentation of the encounter.  Jeral Pinch, MD  Division of Gynecologic Oncology  Department of Obstetrics and Gynecology  [facility]

## 2019-11-14 ENCOUNTER — Other Ambulatory Visit: Payer: Self-pay

## 2019-11-14 ENCOUNTER — Inpatient Hospital Stay: Payer: 59 | Attending: Hematology & Oncology | Admitting: Gynecologic Oncology

## 2019-11-14 ENCOUNTER — Encounter: Payer: Self-pay | Admitting: Gynecologic Oncology

## 2019-11-14 VITALS — BP 99/80 | HR 78 | Temp 97.8°F | Resp 17 | Ht 66.0 in | Wt 138.6 lb

## 2019-11-14 DIAGNOSIS — Z148 Genetic carrier of other disease: Secondary | ICD-10-CM | POA: Insufficient documentation

## 2019-11-14 DIAGNOSIS — E039 Hypothyroidism, unspecified: Secondary | ICD-10-CM | POA: Insufficient documentation

## 2019-11-14 DIAGNOSIS — Z86718 Personal history of other venous thrombosis and embolism: Secondary | ICD-10-CM | POA: Diagnosis not present

## 2019-11-14 DIAGNOSIS — Z86711 Personal history of pulmonary embolism: Secondary | ICD-10-CM | POA: Diagnosis not present

## 2019-11-14 DIAGNOSIS — Z90710 Acquired absence of both cervix and uterus: Secondary | ICD-10-CM | POA: Diagnosis not present

## 2019-11-14 DIAGNOSIS — Z90722 Acquired absence of ovaries, bilateral: Secondary | ICD-10-CM | POA: Diagnosis not present

## 2019-11-14 DIAGNOSIS — Z79899 Other long term (current) drug therapy: Secondary | ICD-10-CM | POA: Diagnosis not present

## 2019-11-14 DIAGNOSIS — Z8542 Personal history of malignant neoplasm of other parts of uterus: Secondary | ICD-10-CM | POA: Insufficient documentation

## 2019-11-14 DIAGNOSIS — Z7901 Long term (current) use of anticoagulants: Secondary | ICD-10-CM | POA: Insufficient documentation

## 2019-11-14 DIAGNOSIS — G893 Neoplasm related pain (acute) (chronic): Secondary | ICD-10-CM | POA: Diagnosis not present

## 2019-11-14 DIAGNOSIS — Z7982 Long term (current) use of aspirin: Secondary | ICD-10-CM | POA: Insufficient documentation

## 2019-11-14 DIAGNOSIS — Z8673 Personal history of transient ischemic attack (TIA), and cerebral infarction without residual deficits: Secondary | ICD-10-CM | POA: Insufficient documentation

## 2019-11-14 DIAGNOSIS — C541 Malignant neoplasm of endometrium: Secondary | ICD-10-CM | POA: Insufficient documentation

## 2019-11-14 DIAGNOSIS — Z9889 Other specified postprocedural states: Secondary | ICD-10-CM | POA: Diagnosis not present

## 2019-11-14 NOTE — Patient Instructions (Signed)
Dr. Berline Lopes will be in contact about clinical trials.

## 2019-11-15 ENCOUNTER — Encounter: Payer: Self-pay | Admitting: Gynecologic Oncology

## 2019-11-27 ENCOUNTER — Encounter: Payer: Self-pay | Admitting: Hematology & Oncology

## 2019-11-28 ENCOUNTER — Other Ambulatory Visit: Payer: Self-pay | Admitting: *Deleted

## 2019-11-28 ENCOUNTER — Other Ambulatory Visit: Payer: Self-pay | Admitting: Hematology & Oncology

## 2019-11-28 ENCOUNTER — Telehealth: Payer: Self-pay | Admitting: Hematology & Oncology

## 2019-11-28 ENCOUNTER — Encounter: Payer: Self-pay | Admitting: *Deleted

## 2019-11-28 DIAGNOSIS — C541 Malignant neoplasm of endometrium: Secondary | ICD-10-CM

## 2019-11-28 NOTE — Progress Notes (Signed)
Kindly inform the patient that cardiac monitoring did not show evidence of any any worrisome arrhythmias

## 2019-12-01 ENCOUNTER — Telehealth: Payer: Self-pay | Admitting: Genetic Counselor

## 2019-12-01 NOTE — Telephone Encounter (Signed)
Received a genetic counseling referral from Dr. Marin Olp for endometrial cancer. Pt has been cld and scheduled for a virtual visit on 6/24 at 10am. I verified the pt has an active mychart acct.

## 2019-12-03 ENCOUNTER — Telehealth: Payer: Self-pay | Admitting: Genetic Counselor

## 2019-12-03 ENCOUNTER — Other Ambulatory Visit: Payer: Self-pay

## 2019-12-03 ENCOUNTER — Inpatient Hospital Stay (HOSPITAL_BASED_OUTPATIENT_CLINIC_OR_DEPARTMENT_OTHER): Payer: 59 | Admitting: Hematology & Oncology

## 2019-12-03 ENCOUNTER — Encounter: Payer: Self-pay | Admitting: Family Medicine

## 2019-12-03 ENCOUNTER — Telehealth: Payer: Self-pay | Admitting: *Deleted

## 2019-12-03 ENCOUNTER — Encounter: Payer: Self-pay | Admitting: Hematology & Oncology

## 2019-12-03 VITALS — BP 114/82 | HR 66 | Temp 97.9°F | Resp 20 | Wt 136.0 lb

## 2019-12-03 DIAGNOSIS — C541 Malignant neoplasm of endometrium: Secondary | ICD-10-CM | POA: Diagnosis not present

## 2019-12-03 MED ORDER — THYROID 60 MG PO TABS: ORAL_TABLET | ORAL | 2 refills | Status: DC

## 2019-12-03 MED ORDER — THYROID 90 MG PO TABS: ORAL_TABLET | ORAL | 1 refills | Status: DC

## 2019-12-03 NOTE — Telephone Encounter (Signed)
error 

## 2019-12-03 NOTE — Telephone Encounter (Signed)
Left message on patient's vm to verify mychart visit for pre reg

## 2019-12-03 NOTE — Progress Notes (Signed)
Hematology and Oncology Follow Up Visit  Sherry Williams [id number] [birth year] 59 y.o. 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  Literally, the "clot" thickens with respect to Sherry Williams.  We did do a genetic assay on the cancer that was removed.  Surprisingly, the cancer is positive for the BRCA2 gene.  I really have to wonder if this malignancy is a new malignancy and not secondary to recurrent uterine cancer.  This malignancy seems to be behaving more like ovarian cancer.  The tumor is high-grade.  I will try to send the tumor off for molecular array testing and this might help [place] out with respect to a primary.  It certainly would make sense to me that this was an ovarian cancer.  I think if this was an ovarian cancer, she definitely would have a better chance of responding to systemic chemotherapy and then being placed onto a PARP inhibitor.  We are going to do a genetic test on her to see if she has a germline BRCA2 mutation.  She actually goes to [facility] tomorrow for a repeat TEE to see if there is any marantic endocarditis that may be a source for these CVAs that she has had.  She has had her gynecologic surgery probably about 6 weeks ago.  She is still recovering from this.  She looks fantastic.  She still trying to stay active.  She does a lot of walking.  She still is interested in doing a more holistic approach for this malignancy.  She really wants to try to avoid chemotherapy if possible.  I told her that if this was ovarian cancer, that she should do quite well with systemic chemotherapy followed by the PARP inhibitor.  Currently, I would have to say that her performance status is ECOG 0.     Medications:  Current Outpatient Medications:  .  ASPIRIN 81 PO, Take by mouth daily., Disp: , Rfl:  .  fondaparinux (ARIXTRA) 7.5 MG/0.6ML SOLN injection, Inject 7.5 mg into the skin daily., Disp: , Rfl:  .  rosuvastatin (CRESTOR) 20 MG tablet, Take 20 mg by mouth daily., Disp: , Rfl:  .  thyroid (ARMOUR THYROID) 60 MG tablet, TAKE 1 TABLET BY MOUTH EVERY DAY BEFORE BREAKFAST. TAKE 1 DAILY 5 DAYS PER WEEK, Disp: 90 tablet, Rfl: 2 .  thyroid (ARMOUR THYROID) 90 MG tablet, TAKE 1 TABLET BY MOUTH EVERY DAY TWO DAYS PER WEEK, Disp: 90 tablet, Rfl: 1 .  fondaparinux (ARIXTRA) 7.5 MG/0.6ML SOLN injection, Inject 0.6 mLs (7.5 mg total) into the skin daily., Disp: 18 mL, Rfl: 3 .  rosuvastatin (CRESTOR) 40 MG tablet, Take 40 mg by mouth daily. Take 1/2 tablet, total of 20 mg by mouth daily., Disp: , Rfl:   Allergies:  Allergies  Allergen Reactions  . Bee Venom Anaphylaxis and Swelling  . Adhesive [Tape] Other (See Comments)    Irritation and red  . Aspartame Diarrhea  . Aspartame And Phenylalanine Diarrhea    Past Medical History, Surgical history, Social history, and Family History were reviewed and updated.  Review of Systems: Review of Systems  Constitutional: Negative.   HENT: Negative.   Eyes: Negative.   Respiratory:  Negative.   Cardiovascular: Negative.   Gastrointestinal: Positive for abdominal pain.  Genitourinary: Negative.   Musculoskeletal: Negative.   Skin: Negative.   Neurological: Negative.   Endo/Heme/Allergies: Negative.   Psychiatric/Behavioral: Negative.     Physical Exam:  weight is 136 lb 0.6 oz (61.7 kg). Her oral temperature is 97.9 F (36.6 C). Her blood pressure is 114/82 and her pulse is 66. Her respiration is 20 and oxygen saturation is 99%.   Wt Readings from Last 3 Encounters:  12/03/19 136 lb 0.6 oz (61.7 kg)  11/14/19 138 lb 9.6 oz (62.9 kg)  10/31/19 138 lb (62.6 kg)    Physical Exam Vitals reviewed.  HENT:     Head: Normocephalic and  atraumatic.  Eyes:     Pupils: Pupils are equal, round, and reactive to light.  Cardiovascular:     Rate and Rhythm: Normal rate and regular rhythm.     Heart sounds: Normal heart sounds.  Pulmonary:     Effort: Pulmonary effort is normal.     Breath sounds: Normal breath sounds.  Abdominal:     General: Bowel sounds are normal.     Palpations: Abdomen is soft.     Comments: Abdominal exam shows a soft abdomen.  She has well healing laparoscopic scars.  I think there are 5 laparoscopic scars.  She has no erythema or exudate or warmth associated with these.  She has no fluid wave in the abdomen.  There is no guarding or rebound tenderness.  She has no palpable liver or spleen tip.  Musculoskeletal:        General: No tenderness or deformity. Normal range of motion.     Cervical back: Normal range of motion.  Lymphadenopathy:     Cervical: No cervical adenopathy.  Skin:    General: Skin is warm and dry.     Findings: No erythema or rash.  Neurological:     Mental Status: She is alert and oriented to person, place, and time.  Psychiatric:        Behavior: Behavior normal.        Thought Content: Thought content normal.        Judgment: Judgment normal.      Lab Results  Component Value Date   WBC 4.1 10/31/2019   HGB 12.9 10/31/2019   HCT 39.4 10/31/2019   MCV 87.4 10/31/2019   PLT 277 10/31/2019     Chemistry      Component Value Date/Time   NA 141 10/31/2019 0752   NA 142 02/15/2017 1518   NA 140 04/13/2016 1055   K 4.1 10/31/2019 0752   K 4.1 02/15/2017 1518   K 4.1 04/13/2016 1055   CL 104 10/31/2019 0752   CL 107 02/15/2017 1518   CO2 30 10/31/2019 0752   CO2 29 02/15/2017 1518   CO2 27 04/13/2016 1055   BUN 14 10/31/2019 0752   BUN 17 02/15/2017 1518   BUN 22.1 04/13/2016 1055   CREATININE 0.88 10/31/2019 0752   CREATININE 1.0 02/15/2017 1518   CREATININE 0.8 04/13/2016 1055      Component Value Date/Time   CALCIUM 10.1 10/31/2019 0752   CALCIUM 9.3  02/15/2017 1518   CALCIUM 9.9 04/13/2016 1055   ALKPHOS 54 10/31/2019 0752   ALKPHOS 79 02/15/2017 1518   ALKPHOS 81 04/13/2016 1055   AST 15 10/31/2019 0752   AST 20 04/13/2016 1055   ALT 9 10/31/2019 0752   ALT 24 02/15/2017 1518   ALT 22  04/13/2016 1055   BILITOT 0.5 10/31/2019 0752   BILITOT 0.44 04/13/2016 1055      Impression and Plan: Sherry Williams is a 59 year old Caucasian female.  She has had issues with recurrence of thromboembolic events.  She has had these systemically and in the brain.  I think that doing a molecular array test to try to prove if this is ovarian cancer would really be helpful.  It will definitely dictate our treatment recommendations.  Hopefully, we will be able to get the blood test for her germline BRCA2 mutation.  There is still a lot of "variables" that we have here.  I really believe that the molecular array testing will clear a lot of uncertainty up for [place] and will help Sherry Williams with respect to recommendations and prognosis.  We will plan to get her back depending on what we find with our molecular array test and the TEE done at [facility].  I spent about 40 minutes with her this afternoon.    [name], MD 6/23/20215:41 PM

## 2019-12-03 NOTE — Telephone Encounter (Signed)
Spoke with pt and confirmed she had already been made aware of the cardiac monitor results. She stated they had discussed a loop recorder but at this time that is on hold as she would have to be placed on blood thinners and she is already on them. She verbalized appreciation for the call. Her questions were answered.    Garvin Fila, MD  11/28/2019 11:57 AM EDT     Kindly inform the patient that cardiac monitoring did not show evidence of any any worrisome arrhythmias   No a-fib per Dr. Rayann Heman.

## 2019-12-03 NOTE — Progress Notes (Signed)
12/03/2019 1615 Attempted to draw lab for genetic testing. Unsuccessful x 2 attempts.  Will try tomorrow at [facility], she is going for TEE.

## 2019-12-04 ENCOUNTER — Telehealth: Payer: Self-pay | Admitting: Hematology & Oncology

## 2019-12-04 ENCOUNTER — Inpatient Hospital Stay (HOSPITAL_BASED_OUTPATIENT_CLINIC_OR_DEPARTMENT_OTHER): Payer: 59 | Admitting: Genetic Counselor

## 2019-12-04 ENCOUNTER — Encounter: Payer: Self-pay | Admitting: *Deleted

## 2019-12-04 ENCOUNTER — Encounter: Payer: Self-pay | Admitting: Genetic Counselor

## 2019-12-04 DIAGNOSIS — R898 Other abnormal findings in specimens from other organs, systems and tissues: Secondary | ICD-10-CM

## 2019-12-04 DIAGNOSIS — Z800 Family history of malignant neoplasm of digestive organs: Secondary | ICD-10-CM

## 2019-12-04 DIAGNOSIS — Z803 Family history of malignant neoplasm of breast: Secondary | ICD-10-CM

## 2019-12-04 DIAGNOSIS — C541 Malignant neoplasm of endometrium: Secondary | ICD-10-CM

## 2019-12-04 DIAGNOSIS — Z8042 Family history of malignant neoplasm of prostate: Secondary | ICD-10-CM

## 2019-12-04 NOTE — Telephone Encounter (Signed)
No los 6/23 

## 2019-12-04 NOTE — Progress Notes (Signed)
[facility] test requisition form faxed to [phone] per order of Dr. Marin Olp.

## 2019-12-04 NOTE — Progress Notes (Signed)
REFERRING PROVIDER: [name], MD [address] [place],  [place] [address]  PRIMARY PROVIDER:  Eulas Post, MD  PRIMARY REASON FOR VISIT:  1. Endometrial cancer ([place])   2. Family history of prostate cancer in father   40. Abnormal genetic test   4. Family history of breast cancer   5. Family history of pancreatic cancer      HISTORY OF PRESENT ILLNESS:   Sherry Williams, a 59 y.o. female, was seen for a [place] cancer genetics consultation at the request of Dr. Marin Olp due to a personal history of endometrial cancer, somatic BRCA2 mutations, and family history of prostate, breast, and pancreatic cancers.  Sherry Williams presents to clinic today to discuss the possibility of a hereditary predisposition to cancer, genetic testing, and to further clarify her future cancer risks, as well as potential cancer risks for family members.   In 2018, at the age of 12, Sherry Williams was diagnosed with endometrial cancer. She received underwent TRH/BSO.   In May 2021, she underwent laproscopic exploration by Dr. Berline Lopes in which multiple biopsies were taken of the pelvis. In May 2021, she was diagnosed with high grade carcinoma of gynecologic origin (normal IHC expression, PR and HER2 neg).  Upon somatic tumor testing (Oncotype; report date 11/03/2019), Sherry Williams was found to be positive for a somatic mutation in BRCA2 ([id number]*; 8% quantity). Treatment recommendations are pending.    CANCER HISTORY:  Oncology History Overview Note  Recurrence MSI-stable, PR and HER2 neg   Endometrial cancer ([place])  04/17/2017 Initial Diagnosis   Endometrial cancer ([place])   04/17/2017 Surgery   TRH/BSO, SLN bx   04/17/2017 Pathologic Stage   IA grade 1 EMCA Negative SLNs, negative LVSI   10/21/2019 Surgery   Robotic-assisted laparoscopic pelvic and para-aortic lymph node sampling  On abdominal entry, normal upper abdominal survey noted.  Normal-appearing omentum and small and large bowel.   Along the right sidewall, 2 nodules noted on the peritoneal surface concerning for hypermetabolic lesion seen on recent PET.  One lesion was intimately associated with the uterine artery remnant and the other overlying the obturator nerve.  A second was most consistent with lymphatic tissue.  Both sent for frozen section and returned showing significant necrosis of the tissue but appearance of adenocarcinoma, likely high-grade.  No other obvious pelvic adenopathy.  Prominent presacral and right para-aortic node visualized and also resected.  Para-aortic node somewhat adherent to IVC.  No other obvious pelvic or abdominal evidence of disease.   10/21/2019 Relapse/Recurrence   A. RIGHT PELVIC SIDEWALL NODULE AND UTERINE ARTERY REMNANT, BIOPSY:  - High grade carcinoma focally involving fibrous and adipose tissue.   B. LYMPH NODE, RIGHT OBTURATOR, BIOPSY:  - Metastatic carcinoma in (1) of (1) lymph node.   C. LYMPH NODE, RIGHT EXTERNAL ILIAC, BIOPSY:  - Metastatic carcinoma in (1) of (1) lymph node.   D. LYMPH NODE, PRESACRAL, BIOPSY:  - Metastatic carcinoma in (1) of (1) lymph node.   E. LYMPH NODE, RIGHT PARA-AORTIC, BIOPSY:  - Metastatic carcinoma in (1) of (1) lymph node.   F. RIGHT SIDEWALL, BIOPSY:  - High grade carcinoma involving fibrous tissue.         RISK FACTORS:  Menarche was at age 43 .  First live birth at age 67.  OCP use for less than 1 year.  Ovaries intact: no; BSO in 2018  Hysterectomy: yes; TRH in 2018 due to personal hx of endometrial cancer  Menopausal  status: postmenopausal; menopause in mid 84s HRT use: 0 years. Colonoscopy: yes; most recent in 2013; pt reports no polyps detected. Mammogram within the last year: yes. Number of breast biopsies: 1. Up to date with pelvic exams: yes.  Past Medical History:  Diagnosis Date  . Abnormal genetic test   . Allergy   . Arthritis   . DVT (deep venous thrombosis) ([place])    lower extremity   . Endometrial ca ([place])  03/14/2017   uterine cancer 2018  . Family history of adverse reaction to anesthesia    children had N/V   . Family history of breast cancer   . Family history of pancreatic cancer   . Family history of prostate cancer in father   . History of kidney stones   . Hypothyroidism    Hashimoto  . Lichen sclerosus   . MRSA (methicillin resistant staph aureus) culture positive   . Pneumonia    walking  . Pulmonary embolism ([place]) 01/13/2016   After car trip  . 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 ABDOMINAL HYSTERECTOMY    . BUBBLE STUDY  07/31/2019   Procedure: BUBBLE STUDY;  Surgeon: [name], MD;  Location: [place] [facility];  Service: Cardiovascular;;  . CYSTOSCOPY WITH RETROGRADE PYELOGRAM, URETEROSCOPY AND STENT PLACEMENT Left 05/18/2018   Procedure: CYSTOSCOPY WITH RETROGRADE PYELOGRAM, LEFT URETEROSCOPY AND LEFT URETERAL STENT PLACEMENT;  Surgeon: [name], MD;  Location: [facility];  Service: Urology;  Laterality: Left;  . CYSTOSCOPY WITH RETROGRADE PYELOGRAM, URETEROSCOPY AND STENT PLACEMENT Right 05/30/2018   Procedure: CYSTOSCOPY WITH RIGHT RETROGRADE PYELOGRAM, URETEROSCOPY LASER LITHOTRIPSY AND STENT PLACEMENT;  Surgeon: [name], MD;  Location[facility];  Service: Urology;  Laterality: Right;  . HOLMIUM LASER APPLICATION Left 60/09/5407   Procedure: HOLMIUM LASER APPLICATION;  Surgeon: [name], MD;  Location: [facility];  Service: Urology;  Laterality: Left;  . HOLMIUM LASER APPLICATION Right 81/19/1478   Procedure: HOLMIUM LASER APPLICATION;  Surgeon: [name], MD;  Location: [facility];  Service: Urology;  Laterality: Right;  . MOUTH SURGERY    . ROBOTIC ASSISTED TOTAL HYSTERECTOMY WITH BILATERAL SALPINGO OOPHERECTOMY Bilateral 04/17/2017   Procedure: XI ROBOTIC ASSISTED TOTAL HYSTERECTOMY WITH BILATERAL  SALPINGO OOPHORECTOMY WITH SENTINAL LYMPH NODE BIOPSY AND POSSIBLE LYMPHADENECTOMY;  Surgeon: [name], MD;  Location: [facility];  Service: Gynecology;  Laterality: Bilateral;  . ROBOTIC PELVIC AND PARA-AORTIC LYMPH NODE DISSECTION N/A 10/21/2019   Procedure: XI ROBOTIC PELVIC AND PARA-AORTIC LYMPH NODE DISSECTION;  Surgeon: Lafonda Mosses, MD;  Location[facility];  Service: Gynecology;  Laterality: N/A;  . TEE WITHOUT CARDIOVERSION N/A 07/31/2019   Procedure: TRANSESOPHAGEAL ECHOCARDIOGRAM (TEE);  Surgeon: [name], MD;  Location: [facility];  Service: Cardiovascular;  Laterality: N/A;    Social History   Socioeconomic History  . Marital status: Married    Spouse name: Not on file  . Number of children: 4  . Years of education: Not on file  . Highest education level: Not on file  Occupational History  . Occupation: Press photographer  Tobacco Use  . Smoking status: Never Smoker  . Smokeless tobacco: Never Used  Vaping Use  . Vaping Use: Never used  Substance and Sexual Activity  . Alcohol use: Yes    Alcohol/week: 1.0 standard drink  Types: 1 Glasses of wine per week    Comment: occassional  . Drug use: No  . Sexual activity: Yes    Birth control/protection: Surgical  Other Topics Concern  . Not on file  Social History Narrative  . Not on file   Social Determinants of Health   Financial Resource Strain:   . Difficulty of Paying Living Expenses:   Food Insecurity:   . Worried About Charity fundraiser in the Last Year:   . Arboriculturist in the Last Year:   Transportation Needs:   . Film/video editor (Medical):   Marland Kitchen Lack of Transportation (Non-Medical):   Physical Activity:   . Days of Exercise per Week:   . Minutes of Exercise per Session:   Stress:   . Feeling of Stress :   Social Connections:   . Frequency of Communication with Friends and Family:   . Frequency of Social Gatherings with Friends and Family:   . Attends Religious Services:   . Active  Member of Clubs or Organizations:   . Attends Archivist Meetings:   Marland Kitchen Marital Status:      FAMILY HISTORY:  We obtained a detailed, 4-generation family history.    Sherry Williams has two daughters in their mid 60s and a son who passed away at 26.  None of her children have been diagnosed with cancer.  She has 15 year old brother and 57 year old sister who have not been diagnosed with cancer.  There is no cancer reported in her nieces and nephews.    Sherry Williams mother is still living at 50 years old.  Her mother and her maternal half aunts and uncles have not been diagnosed with cancer to her knowledge.  Sherry Williams maternal grandmother passed away in her 26s from pancreatic cancer.  Sherry Williams paternal grandfather passed away in his 80s and was not reported to have cancer.   Sherry Williams father was diagnosed with prostate cancer and cancer of the nasal tissue at unknown ages.  He is alive at 60 years old.  Sherry Williams has one paternal uncle who passed away in his 73s with no reported history of cancer.  Sherry Williams paternal grandmother was reportedly diagnosed with breast cancer in her 45s and lived until her early 63s.  Sherry Williams reported that her paternal grandfather passed away at 76 years old from a war-related incidentt.     Sherry Williams is unaware of previous family history of genetic testing for hereditary cancer risks. Patient's maternal ancestors are of [place] descent, and paternal ancestors are of [place] descent. There is no reported Ashkenazi Jewish ancestry. There is no known consanguinity.  Please see below for full 4-generation pedigree of affected and unaffected individuals.          GENETIC COUNSELING ASSESSMENT: Sherry Williams is a 59 y.o. female with a personal history of history of endometrial cancer, family history of breast, prostate, and pancreatic, and a somatic BRCA2 mutation which is somewhat suggestive of hereditary cancer syndrome. We, therefore,  discussed and recommended the following at today's visit.   DISCUSSION: We discussed that approximately 5 -10% of cancer is hereditary.  Hereditary cancers are caused by pathogenic variants in genes that increase cancer risk compared to the general population.  Characteristics of hereditary cancer syndromes include cancers at a young age compared to the general population, individuals with multiple primary cancers, and multiple family members with the same or related cancers.   We  discussed that Sherry Williams's family history of breast, prostate, and pancreatic cancer may be suggestive of a hereditary cancer syndrome, such as Hereditary Breast and Ovarian Cancer (HBOC). HBOC is caused by pathogenic variants in the BRCA1 and BRCA2 genes.  We discussed that pathogenic variants in other genes can be associated with hereditary cancer syndromes.  Type of cancer risk and level of risk are dependent on the gene. Furthermore, we discussed that somatic mutations that were detected by Oncotype, including the BRCA2 [id number]* mutation, may or may not be present in the germline. We discussed that if a BRCA2 was detected in the germline, there would be implications for cancer survelliance and implications for cancer risk in family members. We briefly discussed cancer risks and management associated with germline BRCA2 mutations including increased risks for female breast, pancreatic, ovarian, and melanoma cancers as well as female breast cancer and prostate cancer in men.  Furthermore, we discussed that, if positive in the germline, first degree relatives would have a 50% chance of carrying this mutation. We discussed that testing is beneficial for several reasons including knowing how to follow individuals after completing their treatment, identifying whether potential treatment options such as PARP inhibitors would be beneficial, and understand if other family members could be at risk for cancer and allow them to undergo genetic  testing.    Based on Sherry Williams's somatic BRCA2 mutation and her family history of cancer, she meets medical criteria for germline genetic testing. Genetic testing for Sherry Williams is medically necessary as the results of germline genetic testing can inform her cancer management and future cancer risk. Additionally, results of genetic testing may provide information to family members about cancer risk, surveillance, and management options.   We also discussed genetic testing, including the appropriate family members to test, the process of testing, possible results, and the implications of these results.  Possible results include negative, positive and/or variants of uncertain significance.  We discussed insurance coverage and turn-around-time for results. Despite meeting criteria, she may still have an out of pocket cost. We discussed that if her out of pocket cost for testing is over $100, the laboratory will call and confirm whether she wants to proceed with testing.  If the out of pocket cost of testing is less than $100 she will be billed by the genetic testing laboratory.       Sherry Williams was offered a common hereditary cancer panel (47 genes) and an expanded pan-cancer panel (84 genes). Sherry Williams. Wik was informed of the benefits and limitations of each panel, including that expanded pan-cancer panels contain several preliminary evidence genes that do not have clear management guidelines at this point in time.  We also discussed that as the number of genes included on a panel increases, the chances of variants of uncertain significance increases.  After considering the benefits and limitations of each gene panel, Sherry Williams. Luan Pulling elected to have a expanded pan-cancer panel through [facility].    GENETIC TESTING: We discussed the methodology and potential implications of the following genetic test:   [facility] Multi-Cancer Panel (84 genes): Next generation sequencing and deletion/duplicaiton analysis of AIP,  ALK, APC, ATM, AXIN2, BAP1, BARD1, BLM, BMPR1A, BRCA1, BRCA2, BRIP1, CASR, CDC73, CDH1, CDK4, CDKN1B, CDKN1C, CDKN2A, CEBPA, CHEK2, CTNNA1, DICER1, DIS3L2, EGFR, EPCAM, FH, FLCN, GATA2, GPC3, GREM1, HOXB13, HRAS, KIT, MAX, MEN1, MET, MITF, MLH1, MSH2, MSH3, MSH6, MUTYH, NBN, NF1, NF2, NTHL1, PALB2, PDGFRA, PHOX2B, PMS2, POLD1, POLE, POT1, PRKAR1A, PTCH1, PTEN, RAD50, RAD51C, RAD51D, RB1, RECQL4, RET, RUNX1,  SDHA, SDHAF2, SDHB, SDHC, SDHD, SMAD4, SMARCA4, SMARCB1, SMARCE1, STK11, SUFU, TERC, TERT, TMEM127, TP53, TSC1, TSC2, VHL, WRN, WT1 APC: The 1B promoter region is covered by both sequencing and deletion/duplication analysis. The 1A promoter region is covered by deletion/duplication analysis. Sequencing analysis for exons 5 includes only cds +/- 10 bp. ATM: Sequencing analysis for exons 6, 24, 43 includes only cds +/- 10 bp. UXLK4M: Deletion/duplication analysis covers the promoter region. BRCA1: Sequence analysis includes +/- 20 base pairs of adjacent intronic sequence. BRCA2: Sequence analysis includes +/- 20 base pairs of adjacent intronic sequence. CDKN2A (p16INK4a): Analysis supports interpretation of the p14 and p16 proteins. DICER1: Sequencing analysis for exons 22 includes only cds +/- 10 bp. EPCAM: Sequencing analysis is not offered for this gene. FH: Sequencing analysis for exons 9 includes only cds +/- 10 bp. GPC3: Sequencing analysis for exons 3 includes only cds +/- 10 bp. GREM1: Promoter region duplication testing only. MAX: Sequencing analysis for exons 2 includes only cds +/- 10 bp. MEN1: Sequencing analysis for exons 2 includes only cds +/- 10 bp. MET: Sequencing analysis for exons 12 includes only cds +/- 10 bp. MITF: c.952G>A, p.Glu318Lys variant only. MLH1: Deletion/duplication analysis covers the promoter region. Sequencing analysis for exons 12 includes only cds +/- 10 bp. MSH2: Analysis includes the exon 1-7 inversion (Boland mutation). Sequencing analysis for exons 2, 5  includes only cds +/- 10 bp. MSH3: Sequencing analysis of the repeat region of exon 1 [phone]) is not offered MSH6: Sequencing analysis for exons 7, 10 includes only cds +/- 10 bp. NF1: Sequencing analysis for exons 2, 7, 25, 41, 48 includes only cds +/- 10 bp. PHOX2B: Alanine repeat numbers for the commonly-expanded region in exon 3 are not determined. PMS2: Sequencing analysis for exons 7 includes only cds +/- 10 bp. POLD1: Sequencing analysis for exons 22 includes only cds +/- 10 bp. PTEN: Deletion/duplication analysis covers the promoter region. Sequencing analysis for exons 8 includes only cds +/- 10 bp. RB1: Sequencing analysis for exons 15-16 includes only cds +/- 10 bp. RECQL4: Sequencing analysis for exons 13-14 includes only cds +/- 10 bp. SDHA: Deletion/duplication analysis is not offered for this gene and sequencing analysis is not offered for exon 14. Sequencing analysis for exons 6-8 includes only cds +/- 10 bp. SDHC: Sequencing analysis for exons 2, 6 includes only cds +/- 10 bp. QV95: Deletion/duplication analysis covers the promoter region. TSC1: Sequencing analysis for exons 21 includes only cds +/- 10 bp. WRN: Deletion/duplication analysis is not offered for exons 10-11. Sequencing analysis for exons 8, 10-11 includes only cds +/- 10 bp.   PLAN: After considering the risks, benefits, and limitations, Sherry Williams. Luan Pulling provided informed consent to pursue genetic testing and the blood sample will be sent to [facility] for analysis of the Multi-Cancer Panel (84 genes).  She plans to have her blood drawn at another appointment today or at a future appointment at [facility]. Results should be available within approximately 2-3 weeks after the sample is received, at which point they will be disclosed by telephone to Sherry Williams. Luan Pulling, as will any additional recommendations warranted by these results. Sherry Williams. Zoeller will receive a summary of her genetic counseling visit and  a copy of her results once available. This information will also be available in Epic.   Lastly, we encouraged Sherry Williams. Bodkin to remain in contact with cancer genetics annually so that we can continuously update the family history and inform her of any changes in cancer genetics and testing  that may be of benefit for this family.    Sherry Williams. [place] questions were answered to her satisfaction today. Our contact information was provided should additional questions or concerns arise. Thank you for the referral and allowing [place] to share in the care of your patient.   Johonna Binette M. [name], [email or web address] (P) [phone]  The patient was seen for a total of 60 minutes in face-to-face genetic counseling.  This patient was discussed with Drs. Magrinat, Lindi Adie and/or Burr Medico who agrees with the above.    _______________________________________________________________________ For Office Staff:  Number of people involved in session: 1 Was an Intern/ student involved with case: no

## 2019-12-08 ENCOUNTER — Encounter: Payer: Self-pay | Admitting: Hematology & Oncology

## 2019-12-08 NOTE — Progress Notes (Signed)
RNF43 was added to the [facility] Multi-Cancer Panel ordered for Sherry Williams on 12/04/2019.  The Multi-Cancer Panel offered by [facility] includes sequencing and/or deletion duplication testing of the following 85 genes: AIP, ALK, APC, ATM, AXIN2,BAP1,  BARD1, BLM, BMPR1A, BRCA1, BRCA2, BRIP1, CASR, CDC73, CDH1, CDK4, CDKN1B, CDKN1C, CDKN2A (p14ARF), CDKN2A (p16INK4a), CEBPA, CHEK2, CTNNA1, DICER1, DIS3L2, EGFR (c.2369C>T, p.Thr790Met variant only), EPCAM (Deletion/duplication testing only), FH, FLCN, GATA2, GPC3, GREM1 (Promoter region deletion/duplication testing only), HOXB13 (c.251G>A, p.Gly84Glu), HRAS, KIT, MAX, MEN1, MET, MITF (c.952G>A, p.Glu318Lys variant only), MLH1, MSH2, MSH3, MSH6, MUTYH, NBN, NF1, NF2, NTHL1, PALB2, PDGFRA, PHOX2B, PMS2, POLD1, POLE, POT1, PRKAR1A, PTCH1, PTEN, RAD50, RAD51C, RAD51D, RB1, RECQL4, RET, RNF43, RUNX1, SDHAF2, SDHA (sequence changes only), SDHB, SDHC, SDHD, SMAD4, SMARCA4, SMARCB1, SMARCE1, STK11, SUFU, TERC, TERT, TMEM127, TP53, TSC1, TSC2, VHL, WRN and WT1.

## 2019-12-10 ENCOUNTER — Other Ambulatory Visit: Payer: Self-pay | Admitting: Hematology & Oncology

## 2019-12-10 ENCOUNTER — Encounter: Payer: Self-pay | Admitting: Hematology & Oncology

## 2019-12-10 DIAGNOSIS — Z7189 Other specified counseling: Secondary | ICD-10-CM

## 2019-12-10 HISTORY — DX: Other specified counseling: Z71.89

## 2019-12-17 ENCOUNTER — Telehealth: Payer: Self-pay | Admitting: Genetic Counselor

## 2019-12-17 NOTE — Telephone Encounter (Signed)
Sherry Williams was contacted at [phone] regarding results. A voicemail was left at this number requesting a call back in order to return genetic testing results to the patient.  [name] M. [name], [name] (P) [phone]

## 2019-12-17 NOTE — Telephone Encounter (Signed)
Sherry Williams was informed of normal genetic testing results of [facility] Multi-Cancer panel (AIP, ALK, APC, ATM, AXIN2,BAP1,  BARD1, BLM, BMPR1A, BRCA1, BRCA2, BRIP1, CASR, CDC73, CDH1, CDK4, CDKN1B, CDKN1C, CDKN2A (p14ARF), CDKN2A (p16INK4a), CEBPA, CHEK2, CTNNA1, DICER1, DIS3L2, EGFR (c.2369C>T, p.Thr790Met variant only), EPCAM (Deletion/duplication testing only), FH, FLCN, GATA2, GPC3, GREM1 (Promoter region deletion/duplication testing only), HOXB13 (c.251G>A, p.Gly84Glu), HRAS, KIT, MAX, MEN1, MET, MITF (c.952G>A, p.Glu318Lys variant only), MLH1, MSH2, MSH3, MSH6, MUTYH, NBN, NF1, NF2, NTHL1, PALB2, PDGFRA, PHOX2B, PMS2, POLD1, POLE, POT1, PRKAR1A, PTCH1, PTEN, RAD50, RAD51C, RAD51D, RB1, RECQL4, RET, RNF43, RUNX1, SDHAF2, SDHA (sequence changes only), SDHB, SDHC, SDHD, SMAD4, SMARCA4, SMARCB1, SMARCE1, STK11, SUFU, TERC, TERT, TMEM127, TP53, TSC1, TSC2, VHL, WRN and WT1). We discussed that no pathogenic variants, including the  BRCA2 variant detected during somatic testing, was detected in her germline.  We discussed that these results do not show why she has a cancer history or why there is cancer in the family.  Sherry Williams was informed that the personal and family history of cancer could be due to a variant in an unknown gene or in a location unable to be detected by current technology.  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  Sherry Williams was reminded that VUS results are treated as a negative result in terms of management, surveillance, and familial testing unless later reclassified as pathogenic.  Sherry Williams was informed that she will be sent a copy of her genetic testing report as well as a letter detailing these findings by mail.   Honor Fairbank M. [name], [email or web address] (P) [phone]

## 2019-12-18 ENCOUNTER — Encounter: Payer: Self-pay | Admitting: Genetic Counselor

## 2019-12-18 ENCOUNTER — Ambulatory Visit: Payer: Self-pay | Admitting: Genetic Counselor

## 2019-12-18 DIAGNOSIS — Z8042 Family history of malignant neoplasm of prostate: Secondary | ICD-10-CM

## 2019-12-18 DIAGNOSIS — Z1379 Encounter for other screening for genetic and chromosomal anomalies: Secondary | ICD-10-CM | POA: Insufficient documentation

## 2019-12-18 DIAGNOSIS — Z803 Family history of malignant neoplasm of breast: Secondary | ICD-10-CM

## 2019-12-18 DIAGNOSIS — R898 Other abnormal findings in specimens from other organs, systems and tissues: Secondary | ICD-10-CM

## 2019-12-18 DIAGNOSIS — Z800 Family history of malignant neoplasm of digestive organs: Secondary | ICD-10-CM

## 2019-12-18 NOTE — Progress Notes (Signed)
HPI:  [name] was previously seen in the [place] [facility] due to a personal history of endometrial cancer, somatic BRCA2 variant detected in the tumor, family history of prostate, breast, and pancreatic cancer, and concerns regarding a hereditary predisposition to cancer. Please refer to our prior cancer genetics clinic note for more information regarding our discussion, assessment and recommendations, at the time. [name] recent genetic test results were disclosed to her, as were recommendations warranted by these results. These results and recommendations are discussed in more detail below.  CANCER HISTORY:  Oncology History Overview Note  Recurrence MSI-stable, PR and HER2 neg   Endometrial cancer ([place])  04/17/2017 Initial Diagnosis   Endometrial cancer ([place])   04/17/2017 Surgery   TRH/BSO, SLN bx   04/17/2017 Pathologic Stage   IA grade 1 EMCA Negative SLNs, negative LVSI   10/21/2019 Surgery   Robotic-assisted laparoscopic pelvic and para-aortic lymph node sampling  On abdominal entry, normal upper abdominal survey noted.  Normal-appearing omentum and small and large bowel.  Along the right sidewall, 2 nodules noted on the peritoneal surface concerning for hypermetabolic lesion seen on recent PET.  One lesion was intimately associated with the uterine artery remnant and the other overlying the obturator nerve.  A second was most consistent with lymphatic tissue.  Both sent for frozen section and returned showing significant necrosis of the tissue but appearance of adenocarcinoma, likely high-grade.  No other obvious pelvic adenopathy.  Prominent presacral and right para-aortic node visualized and also resected.  Para-aortic node somewhat adherent to IVC.  No other obvious pelvic or abdominal evidence of disease.   10/21/2019 Relapse/Recurrence   A. 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   B. LYMPH NODE, RIGHT OBTURATOR, BIOPSY:  - Metastatic carcinoma in (1) of (1) lymph node.   C. LYMPH NODE, RIGHT EXTERNAL ILIAC, BIOPSY:  - Metastatic carcinoma in (1) of (1) lymph node.   D. LYMPH NODE, PRESACRAL, BIOPSY:  - Metastatic carcinoma in (1) of (1) lymph node.   E. LYMPH NODE, RIGHT PARA-AORTIC, BIOPSY:  - Metastatic carcinoma in (1) of (1) lymph node.   F. RIGHT SIDEWALL, BIOPSY:  - High grade carcinoma involving fibrous tissue.      12/16/2019 Genetic Testing   Variant of uncertain significance (VUS) was detected in GATA2 c.78C>G (p.His26Gln) through [facility] Multi-Cancer panel offered by [facility].  This panel revealed negative results for all other genes tested.The Multi-Cancer panel offered by [facility] includes sequencing and/or deletion duplication testing of the following 85 genes: AIP, ALK, APC, ATM, AXIN2,BAP1,  BARD1, BLM, BMPR1A, BRCA1, BRCA2, BRIP1, CASR, CDC73, CDH1, CDK4, CDKN1B, CDKN1C, CDKN2A (p14ARF), CDKN2A (p16INK4a), CEBPA, CHEK2, CTNNA1, DICER1, DIS3L2, EGFR (c.2369C>T, p.Thr790Met variant only), EPCAM (Deletion/duplication testing only), FH, FLCN, GATA2, GPC3, GREM1 (Promoter region deletion/duplication testing only), HOXB13 (c.251G>A, p.Gly84Glu), HRAS, KIT, MAX, MEN1, MET, MITF (c.952G>A, p.Glu318Lys variant only), MLH1, MSH2, MSH3, MSH6, MUTYH, NBN, NF1, NF2, NTHL1, PALB2, PDGFRA, PHOX2B, PMS2, POLD1, POLE, POT1, PRKAR1A, PTCH1, PTEN, RAD50, RAD51C, RAD51D, RB1, RECQL4, RET, RNF43, RUNX1, SDHAF2, SDHA (sequence changes only), SDHB, SDHC, SDHD, SMAD4, SMARCA4, SMARCB1, SMARCE1, STK11, SUFU, TERC, TERT, TMEM127, TP53, TSC1, TSC2, VHL, WRN and WT1.  The report date is 12/16/2019.    Endometrial ca ([place])  03/14/2017 Initial Diagnosis   Endometrial ca ([place])   12/16/2019 Genetic Testing   Variant of uncertain significance (VUS) was detected in GATA2 c.78C>G (p.His26Gln) through [facility] Multi-Cancer panel  offered by [facility].  This panel revealed  negative results for all other genes tested.The Multi-Cancer panel offered by [facility] includes sequencing and/or deletion duplication testing of the following 85 genes: AIP, ALK, APC, ATM, AXIN2,BAP1,  BARD1, BLM, BMPR1A, BRCA1, BRCA2, BRIP1, CASR, CDC73, CDH1, CDK4, CDKN1B, CDKN1C, CDKN2A (p14ARF), CDKN2A (p16INK4a), CEBPA, CHEK2, CTNNA1, DICER1, DIS3L2, EGFR (c.2369C>T, p.Thr790Met variant only), EPCAM (Deletion/duplication testing only), FH, FLCN, GATA2, GPC3, GREM1 (Promoter region deletion/duplication testing only), HOXB13 (c.251G>A, p.Gly84Glu), HRAS, KIT, MAX, MEN1, MET, MITF (c.952G>A, p.Glu318Lys variant only), MLH1, MSH2, MSH3, MSH6, MUTYH, NBN, NF1, NF2, NTHL1, PALB2, PDGFRA, PHOX2B, PMS2, POLD1, POLE, POT1, PRKAR1A, PTCH1, PTEN, RAD50, RAD51C, RAD51D, RB1, RECQL4, RET, RNF43, RUNX1, SDHAF2, SDHA (sequence changes only), SDHB, SDHC, SDHD, SMAD4, SMARCA4, SMARCB1, SMARCE1, STK11, SUFU, TERC, TERT, TMEM127, TP53, TSC1, TSC2, VHL, WRN and WT1.  The report date is 12/16/2019.      FAMILY HISTORY:  We obtained a detailed, 4-generation family history.  Significant diagnoses are listed below:  [name] has two daughters in their mid 61s and a son who passed away at 40.  None of her children have been diagnosed with cancer.  She has 76 year old brother and 74 year old sister who have not been diagnosed with cancer.  There is no cancer reported in her nieces and nephews.    [name] mother is still living at 20 years old.  Her mother and her maternal half aunts and uncles have not been diagnosed with cancer to her knowledge.  [name] maternal grandmother passed away in her 7s from pancreatic cancer.  [name] paternal grandfather passed away in his 43s and was not reported to have cancer.   [name] father was diagnosed with prostate cancer and cancer of the nasal tissue at unknown ages.  He is alive at 60 years old.  [name] has one paternal uncle who passed away in his 90s  with no reported history of cancer.  [name] paternal grandmother was reportedly diagnosed with breast cancer in her 45s and lived until her early 40s.  [name] reported that her paternal grandfather passed away at 48 years old from a war-related incident.     [name] is unaware of previous family history of genetic testing for hereditary cancer risks. Patient's maternal ancestors are of [place] descent, and paternal ancestors are of [place] descent. There is no reported Ashkenazi Jewish ancestry. There is no known consanguinity.  Please see below for full 4-generation pedigree of affected and unaffected individuals.      GENETIC TEST RESULTS: Genetic testing reported out on December 16, 2019 through the [facility] Multi-Cancer Panel found no pathogenic mutations. The Multi-Gene Panel offered by [facility] includes sequencing and/or deletion duplication testing of the following 85 genes: AIP, ALK, APC, ATM, AXIN2,BAP1,  BARD1, BLM, BMPR1A, BRCA1, BRCA2, BRIP1, CASR, CDC73, CDH1, CDK4, CDKN1B, CDKN1C, CDKN2A (p14ARF), CDKN2A (p16INK4a), CEBPA, CHEK2, CTNNA1, DICER1, DIS3L2, EGFR (c.2369C>T, p.Thr790Met variant only), EPCAM (Deletion/duplication testing only), FH, FLCN, GATA2, GPC3, GREM1 (Promoter region deletion/duplication testing only), HOXB13 (c.251G>A, p.Gly84Glu), HRAS, KIT, MAX, MEN1, MET, MITF (c.952G>A, p.Glu318Lys variant only), MLH1, MSH2, MSH3, MSH6, MUTYH, NBN, NF1, NF2, NTHL1, PALB2, PDGFRA, PHOX2B, PMS2, POLD1, POLE, POT1, PRKAR1A, PTCH1, PTEN, RAD50, RAD51C, RAD51D, RB1, RECQL4, RET, RNF43, RUNX1, SDHAF2, SDHA (sequence changes only), SDHB, SDHC, SDHD, SMAD4, SMARCA4, SMARCB1, SMARCE1, STK11, SUFU, TERC, TERT, TMEM127, TP53, TSC1, TSC2, VHL, WRN and WT1.  The somatic BRCA2 variant (p.E2599*) was not detected in the germline. The test report has been  scanned into EPIC and is located under the Molecular Pathology section of the Results Review tab.  A portion of the result report is  included below for reference.     We discussed with [name] that because current genetic testing is not perfect, it is possible there may be a gene mutation in one of these genes that current testing cannot detect, but that chance is small.  We also discussed, that there could be another gene that has not yet been discovered, or that we have not yet tested, that is responsible for the cancer diagnoses in the family. It is also possible there is a hereditary cause for the cancer in the family that [name] did not inherit and therefore was not identified in her testing.  Therefore, it is important to remain in touch with cancer genetics in the future so that we can continue to offer [name] the most up to date genetic testing.   Genetic testing did identify a variant of uncertain significance (VUS) was identified in the GATA2 gene called c.78C>G (p.His26Gln).  At this time, it is unknown if this variant is associated with increased cancer risk or if this is a normal finding, but most variants such as this get reclassified to being inconsequential. It should not be used to make medical management decisions. With time, we suspect the lab will determine the significance of this variant, if any. If we do learn more about it, we will try to contact [name] to discuss it further. However, it is important to stay in touch with [place] periodically and keep the address and phone number up to date.  ADDITIONAL GENETIC TESTING: We discussed with [name] that her genetic testing was fairly extensive.  If there are genes identified to increase cancer risk that can be analyzed in the future, we would be happy to discuss and coordinate this testing at that time.    CANCER SCREENING RECOMMENDATIONS: [name] test result is considered negative (normal).  This means that we have not identified a hereditary cause for her personal and family history of cancer at this time. Most cancers happen by chance and  this negative test suggests that her cancer may fall into this category.  While reassuring, this does not definitively rule out a hereditary predisposition to cancer. It is still possible that there could be genetic mutations that are undetectable by current technology. There could be genetic mutations in genes that have not been tested or identified to increase cancer risk.  Therefore, it is recommended she continue to follow the cancer management and screening guidelines provided by her oncology and primary healthcare provider.   An individual's cancer risk and medical management are not determined by genetic test results alone. Overall cancer risk assessment incorporates additional factors, including personal medical history, family history, and any available genetic information that may result in a personalized plan for cancer prevention and surveillance.   RECOMMENDATIONS FOR FAMILY MEMBERS:  Individuals in this family might be at some increased risk of developing cancer, over the general population risk, simply due to the family history of cancer.  We recommended women in this family have a yearly mammogram beginning at age 35, or 64 years younger than the earliest onset of cancer, an annual clinical breast exam, and perform monthly breast self-exams. Women in this family should also have a gynecological exam as recommended by their primary provider. All family members should be referred for colonoscopy starting at age 35.  It is  also possible there is a hereditary cause for the cancer in [name]'s family that she did not inherit and therefore was not identified in her.  Based on [name]'s family history, her father and siblings also qualify for genetic counseling and testing. [name] will let us know if we can be of any assistance in coordinating genetic counseling and/or testing for these family members.   FOLLOW-UP: Lastly, we discussed with [name] that cancer genetics is a rapidly  advancing field and it is possible that new genetic tests will be appropriate for her and/or her family members in the future. We encouraged her to remain in contact with cancer genetics on an annual basis so we can update her personal and family histories and let her know of advances in cancer genetics that may benefit this family.   Our contact number was provided. [name] questions were answered to her satisfaction, and she knows she is welcome to call us at anytime with additional questions or concerns.     Sherry Williams M. [name], [email or web address] (P) [phone]

## 2019-12-19 ENCOUNTER — Encounter: Payer: Self-pay | Admitting: Genetic Counselor

## 2019-12-19 ENCOUNTER — Encounter: Payer: Self-pay | Admitting: *Deleted

## 2019-12-19 ENCOUNTER — Other Ambulatory Visit: Payer: Self-pay | Admitting: Hematology & Oncology

## 2019-12-19 NOTE — Progress Notes (Signed)
Received a faxed copy of [facility] test results. Results given to MD. Copy made and scanned into chart on 12/19/2019.

## 2019-12-29 ENCOUNTER — Encounter (HOSPITAL_COMMUNITY): Payer: Self-pay | Admitting: Gynecologic Oncology

## 2019-12-31 ENCOUNTER — Encounter: Payer: Self-pay | Admitting: Hematology & Oncology

## 2020-01-01 ENCOUNTER — Other Ambulatory Visit: Payer: Self-pay | Admitting: *Deleted

## 2020-01-01 ENCOUNTER — Inpatient Hospital Stay: Payer: 59 | Admitting: Genetic Counselor

## 2020-01-01 MED ORDER — FONDAPARINUX SODIUM 7.5 MG/0.6ML ~~LOC~~ SOLN: 7.5000 mg | SUBCUTANEOUS | 3 refills | Status: DC

## 2020-01-14 ENCOUNTER — Inpatient Hospital Stay: Payer: 59 | Attending: Hematology & Oncology

## 2020-01-14 ENCOUNTER — Inpatient Hospital Stay (HOSPITAL_BASED_OUTPATIENT_CLINIC_OR_DEPARTMENT_OTHER): Payer: 59 | Admitting: Hematology & Oncology

## 2020-01-14 ENCOUNTER — Other Ambulatory Visit: Payer: Self-pay

## 2020-01-14 ENCOUNTER — Ambulatory Visit (HOSPITAL_BASED_OUTPATIENT_CLINIC_OR_DEPARTMENT_OTHER)
Admission: RE | Admit: 2020-01-14 | Discharge: 2020-01-14 | Disposition: A | Discharge status: Home / Self Care | Payer: 59 | Source: Ambulatory Visit | Attending: Hematology & Oncology | Admitting: Hematology & Oncology

## 2020-01-14 ENCOUNTER — Telehealth: Payer: Self-pay | Admitting: *Deleted

## 2020-01-14 ENCOUNTER — Encounter: Payer: Self-pay | Admitting: Hematology & Oncology

## 2020-01-14 ENCOUNTER — Other Ambulatory Visit: Payer: Self-pay | Admitting: *Deleted

## 2020-01-14 VITALS — BP 98/57 | HR 70 | Temp 98.5°F | Resp 18 | Wt 131.0 lb

## 2020-01-14 DIAGNOSIS — C801 Malignant (primary) neoplasm, unspecified: Secondary | ICD-10-CM | POA: Insufficient documentation

## 2020-01-14 DIAGNOSIS — Z7901 Long term (current) use of anticoagulants: Secondary | ICD-10-CM | POA: Insufficient documentation

## 2020-01-14 DIAGNOSIS — Z87442 Personal history of urinary calculi: Secondary | ICD-10-CM | POA: Diagnosis not present

## 2020-01-14 DIAGNOSIS — C541 Malignant neoplasm of endometrium: Secondary | ICD-10-CM

## 2020-01-14 DIAGNOSIS — I82401 Acute embolism and thrombosis of unspecified deep veins of right lower extremity: Secondary | ICD-10-CM

## 2020-01-14 DIAGNOSIS — Z86718 Personal history of other venous thrombosis and embolism: Secondary | ICD-10-CM | POA: Insufficient documentation

## 2020-01-14 DIAGNOSIS — Z8042 Family history of malignant neoplasm of prostate: Secondary | ICD-10-CM

## 2020-01-14 DIAGNOSIS — Z803 Family history of malignant neoplasm of breast: Secondary | ICD-10-CM

## 2020-01-14 DIAGNOSIS — Z800 Family history of malignant neoplasm of digestive organs: Secondary | ICD-10-CM

## 2020-01-14 DIAGNOSIS — R350 Frequency of micturition: Secondary | ICD-10-CM | POA: Diagnosis not present

## 2020-01-14 DIAGNOSIS — R2231 Localized swelling, mass and lump, right upper limb: Secondary | ICD-10-CM | POA: Insufficient documentation

## 2020-01-14 DIAGNOSIS — Z8673 Personal history of transient ischemic attack (TIA), and cerebral infarction without residual deficits: Secondary | ICD-10-CM | POA: Insufficient documentation

## 2020-01-14 LAB — CMP (CANCER CENTER ONLY)
ALT: 16 U/L (ref 0–44)
AST: 22 U/L (ref 15–41)
Albumin: 4.7 g/dL (ref 3.5–5.0)
Alkaline Phosphatase: 62 U/L (ref 38–126)
Anion gap: 9 (ref 5–15)
BUN: 10 mg/dL (ref 6–20)
CO2: 27 mmol/L (ref 22–32)
Calcium: 9.9 mg/dL (ref 8.9–10.3)
Chloride: 104 mmol/L (ref 98–111)
Creatinine: 0.74 mg/dL (ref 0.44–1.00)
GFR, Est AFR Am: >60 mL/min (ref >60)
GFR, Estimated: >60 mL/min (ref >60)
Glucose, Bld: 101 mg/dL — ABNORMAL HIGH (ref 70–99)
Potassium: 3.8 mmol/L (ref 3.5–5.1)
Sodium: 140 mmol/L (ref 135–145)
Total Bilirubin: 0.5 mg/dL (ref 0.3–1.2)
Total Protein: 7.3 g/dL (ref 6.5–8.1)

## 2020-01-14 LAB — CBC WITH DIFFERENTIAL (CANCER CENTER ONLY)
Abs Immature Granulocytes: 0.01 10*3/uL (ref 0.00–0.07)
Basophils Absolute: 0 10*3/uL (ref 0.0–0.1)
Basophils Relative: 0 %
Eosinophils Absolute: 0.1 10*3/uL (ref 0.0–0.5)
Eosinophils Relative: 2 %
HCT: 36.3 % (ref 36.0–46.0)
Hemoglobin: 11.8 g/dL — ABNORMAL LOW (ref 12.0–15.0)
Immature Granulocytes: 0 %
Lymphocytes Relative: 23 %
Lymphs Abs: 1.1 10*3/uL (ref 0.7–4.0)
MCH: 28.9 pg (ref 26.0–34.0)
MCHC: 32.5 g/dL (ref 30.0–36.0)
MCV: 88.8 fL (ref 80.0–100.0)
Monocytes Absolute: 0.3 10*3/uL (ref 0.1–1.0)
Monocytes Relative: 7 %
Neutro Abs: 3.2 10*3/uL (ref 1.7–7.7)
Neutrophils Relative %: 68 %
Platelet Count: 258 10*3/uL (ref 150–400)
RBC: 4.09 MIL/uL (ref 3.87–5.11)
RDW: 13.7 % (ref 11.5–15.5)
WBC Count: 4.8 10*3/uL (ref 4.0–10.5)
nRBC: 0 % (ref 0.0–0.2)

## 2020-01-14 NOTE — Progress Notes (Signed)
Hematology and Oncology Follow Up Visit  Sherry Williams [id number] 1960-10-31 59 y.o. 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  It is always nice to see her.  She comes in with her husband.  This is a first time that I have met him.  He was a former Company secretary.  He served our Tour manager.  She actually has been getting a lot of holistic therapy.  She is gone down to the [facility].  She has been getting different therapies.  Actually, the doctor down there recommended that she take chemotherapy in addition and to their complementary therapies.  She has a firm nodule in the right upper arm.  This is close to where she had an IV placed.  I suspect that is probably thrombophlebitis.  May be an actual thrombus.  She is hypercoagulable likely from her malignancy.  She is on Arixtra.  We will send her down for a Doppler today.  She did have a molecular array study done to try to find the origin of her cancer.  Surprisingly, the test show that a 71 probability was found for cervical adenocarcinoma.  There was a small possibility of endometrial carcinoma.  I just have a hard time believing that this is cervical adenocarcinoma.  I know that the risk factor for cervical adenocarcinoma is women who his mother is took DES while they were pregnant.  Regardless, we are going to get a PET scan on her.  We will try to get this in a couple weeks.  She really has a lot of reservation about chemotherapy.  She is always had hesitancy to take chemotherapy.  She just has a lot of faith in her holistic approach.  I told her that chemotherapy and complimentary therapy that she is taking could certainly be a viable option for  her.  She will need to have a central line put in.  She really would prefer a PICC line because the PICC line can be accessed by her other healthcare providers for complementary therapy.  I think a Port-A-Cath would certainly be best for her.  This would be safest and most convenient.  However, I realize that a Port-A-Cath cannot be accessed by other healthcare providers.  She is not having any abdominal pain.  She and her husband got back from [facility].  That a wonderful time down there.  She walked quite a bit.  She is still quite active physically.  Her appetite has been good.  I do not think she lost any weight.  She is on the Arixtra.  Of note, she did have a TEE done at [facility].  This did not show any evidence of marantic endocarditis.  Currently, her performance status is probably ECOG 0.   Medications:  Current Outpatient Medications:  .  ASPIRIN 81 PO, Take by mouth daily., Disp: , Rfl:  .  fondaparinux (ARIXTRA) 7.5 MG/0.6ML SOLN injection, Inject 0.6 mLs (7.5 mg total) into the skin daily., Disp: 30 mL, Rfl: 3 .  rosuvastatin (CRESTOR) 20 MG tablet, Take 20 mg by mouth daily., Disp: , Rfl:  .  thyroid (ARMOUR THYROID) 60 MG tablet, TAKE 1 TABLET BY  MOUTH EVERY DAY BEFORE BREAKFAST. TAKE 1 DAILY 5 DAYS PER WEEK, Disp: 90 tablet, Rfl: 2 .  thyroid (ARMOUR THYROID) 90 MG tablet, TAKE 1 TABLET BY MOUTH EVERY DAY TWO DAYS PER WEEK, Disp: 90 tablet, Rfl: 1 .  rosuvastatin (CRESTOR) 40 MG tablet, Take 40 mg by mouth daily. Take 1/2 tablet, total of 20 mg by mouth daily., Disp: , Rfl:   Allergies:  Allergies  Allergen Reactions  . Bee Venom Anaphylaxis and Swelling  . Adhesive [Tape] Other (See Comments)    Irritation and red  . Aspartame Diarrhea  . Aspartame And Phenylalanine Diarrhea    Past Medical History, Surgical history, Social history, and Family History were reviewed and updated.  Review of Systems: Review of Systems  Constitutional: Negative.   HENT:  Negative.   Eyes: Negative.   Respiratory: Negative.   Cardiovascular: Negative.   Gastrointestinal: Positive for abdominal pain.  Genitourinary: Negative.   Musculoskeletal: Negative.   Skin: Negative.   Neurological: Negative.   Endo/Heme/Allergies: Negative.   Psychiatric/Behavioral: Negative.     Physical Exam:  weight is 131 lb (59.4 kg). Her oral temperature is 98.5 F (36.9 C). Her blood pressure is 98/57 (abnormal) and her pulse is 70. Her respiration is 18 and oxygen saturation is 100%.   Wt Readings from Last 3 Encounters:  01/14/20 131 lb (59.4 kg)  12/03/19 136 lb 0.6 oz (61.7 kg)  11/14/19 138 lb 9.6 oz (62.9 kg)    Physical Exam Vitals reviewed.  HENT:     Head: Normocephalic and atraumatic.  Eyes:     Pupils: Pupils are equal, round, and reactive to light.  Cardiovascular:     Rate and Rhythm: Normal rate and regular rhythm.     Heart sounds: Normal heart sounds.  Pulmonary:     Effort: Pulmonary effort is normal.     Breath sounds: Normal breath sounds.  Abdominal:     General: Bowel sounds are normal.     Palpations: Abdomen is soft.     Comments: Abdominal exam shows a soft abdomen.  She has well healing laparoscopic scars.  I think there are 5 laparoscopic scars.  She has no erythema or exudate or warmth associated with these.  She has no fluid wave in the abdomen.  There is no guarding or rebound tenderness.  She has no palpable liver or spleen tip.  Musculoskeletal:        General: No tenderness or deformity. Normal range of motion.     Cervical back: Normal range of motion.  Lymphadenopathy:     Cervical: No cervical adenopathy.  Skin:    General: Skin is warm and dry.     Findings: No erythema or rash.  Neurological:     Mental Status: She is alert and oriented to person, place, and time.  Psychiatric:        Behavior: Behavior normal.        Thought Content: Thought content normal.        Judgment: Judgment normal.      Lab Results   Component Value Date   WBC 4.8 01/14/2020   HGB 11.8 (L) 01/14/2020   HCT 36.3 01/14/2020   MCV 88.8 01/14/2020   PLT 258 01/14/2020     Chemistry      Component Value Date/Time   NA 140 01/14/2020 1534   NA 142 02/15/2017 1518   NA 140 04/13/2016 1055   K 3.8 01/14/2020 1534   K 4.1 02/15/2017 1518  K 4.1 04/13/2016 1055   CL 104 01/14/2020 1534   CL 107 02/15/2017 1518   CO2 27 01/14/2020 1534   CO2 29 02/15/2017 1518   CO2 27 04/13/2016 1055   BUN 10 01/14/2020 1534   BUN 17 02/15/2017 1518   BUN 22.1 04/13/2016 1055   CREATININE 0.74 01/14/2020 1534   CREATININE 1.0 02/15/2017 1518   CREATININE 0.8 04/13/2016 1055      Component Value Date/Time   CALCIUM 9.9 01/14/2020 1534   CALCIUM 9.3 02/15/2017 1518   CALCIUM 9.9 04/13/2016 1055   ALKPHOS 62 01/14/2020 1534   ALKPHOS 79 02/15/2017 1518   ALKPHOS 81 04/13/2016 1055   AST 22 01/14/2020 1534   AST 20 04/13/2016 1055   ALT 16 01/14/2020 1534   ALT 24 02/15/2017 1518   ALT 22 04/13/2016 1055   BILITOT 0.5 01/14/2020 1534   BILITOT 0.44 04/13/2016 1055      Impression and Plan: Ms. Laver is a 59 year old Caucasian female.  She has had issues with recurrence of thromboembolic events.  She has had these systemically and in the brain.  I still think that the "key" to this tumor is the fact that it is BRCA2 positive.  I know she had the genetic test for BRCA and was negative.  As such this is a somatic mutation that is in the cancer.  I reassured her that this does not really put her at high risk for breast cancer.  I do think that if the PET scan does not show any real changes compared to her last PET scan, that we can just follow her along.  I just feel that she is asymptomatic right now.  I do not know what we would be gaining by treating her right now.  I would think that toxicity would outweigh the benefits right now.  We will have to see what the Doppler of the arm shows.  Hopefully, there is no thrombus  in the right upper arm.  I will have to get Ms. Hang back to talk more with her regarding chemotherapy.  I know she has a lot of questions.  She is incredibly eloquent and very intelligent.  I was very glad that you following me her husband.  I spent about 45 minutes with her and her husband.  Again, we still have a lot of to go over with respect to further therapy.    [name], MD 8/4/20215:28 PM

## 2020-01-14 NOTE — Telephone Encounter (Signed)
Message received from patient requesting to speak with Dr. Marin Olp regarding possible chemotherapy and port placement and to let him know that she has a "hot, hard place in her arm".  Dr. Marin Olp notified and would like for pt to come in today for labs and to see Dr. Marin Olp.

## 2020-01-15 ENCOUNTER — Encounter: Payer: Self-pay | Admitting: *Deleted

## 2020-01-15 ENCOUNTER — Telehealth: Payer: Self-pay | Admitting: Hematology & Oncology

## 2020-01-15 LAB — CA 125: Cancer Antigen (CA) 125: 355 U/mL — ABNORMAL HIGH (ref 0.0–38.1)

## 2020-01-15 NOTE — Telephone Encounter (Signed)
No los 8/4 

## 2020-01-19 ENCOUNTER — Inpatient Hospital Stay (HOSPITAL_BASED_OUTPATIENT_CLINIC_OR_DEPARTMENT_OTHER): Payer: 59 | Admitting: Hematology & Oncology

## 2020-01-19 ENCOUNTER — Encounter: Payer: Self-pay | Admitting: Family Medicine

## 2020-01-19 ENCOUNTER — Other Ambulatory Visit: Payer: Self-pay

## 2020-01-19 ENCOUNTER — Encounter: Payer: Self-pay | Admitting: Hematology & Oncology

## 2020-01-19 VITALS — BP 103/65 | HR 69 | Temp 98.2°F | Resp 16 | Wt 131.0 lb

## 2020-01-19 DIAGNOSIS — C801 Malignant (primary) neoplasm, unspecified: Secondary | ICD-10-CM | POA: Diagnosis not present

## 2020-01-19 DIAGNOSIS — C541 Malignant neoplasm of endometrium: Secondary | ICD-10-CM

## 2020-01-19 NOTE — Progress Notes (Addendum)
Hematology and Oncology Follow Up Visit  Sherry Williams [id number] Sep 14, 1960 59 y.o. 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  It is always nice to see her.  She comes in with her husband.  This is a first time that I have met him.  He was a former Company secretary.  He served our Tour manager.  She actually has been getting a lot of holistic therapy.  She is gone down to the [facility].  She has been getting different therapies.  Actually, the doctor down there recommended that she take chemotherapy in addition and to their complementary therapies.  She has a firm nodule in the right upper arm.  This is close to where she had an IV placed.  I suspect that is probably thrombophlebitis.  May be an actual thrombus.  She is hypercoagulable likely from her malignancy.  She is on Arixtra.  We will send her down for a Doppler today.  She did have a molecular array study done to try to find the origin of her cancer.  Surprisingly, the test show that a 71% probability was found for cervical adenocarcinoma.  There was a small possibility of endometrial carcinoma.  I just have a hard time believing that this is cervical adenocarcinoma.  We are able to get the CA-125 back.  This is quite elevated at 331.  Regardless of what we are dealing with, I think treatment is still the same.  I think was important is that this tumor is still has the BRCA2 gene.  This is a somatic mutation as she does not have this in her blood line.  She does want to have a Port-A-Cath in.  She does think the Port-A-Cath would be a lot easier to utilize.  She wants to have this and as soon as possible.  We talked about chemotherapy.  The PET scan is can  be done in a couple weeks.  I think that the PET scan will really help [place] out.  I do think that it would be reasonable to give her chemotherapy followed by all PARP inhibitor.  I think this would lead to a very good outcome.  Again, standard therapy would be carboplatinum/Taxol.  I think this would be reasonable and would be fairly well tolerated.  I would think that the CA-125 can tell us how she is responding.  I talked to she and her husband about treatment.  I went over side effects with him.  If she does decide on chemotherapy, she probably would not want to start until after she and her husband get back from [place].  Her parents are up there.  She really wants to visit.  She does have some urinary frequency.  We did do a urinalysis on her today.  The results are not still pending.  She has had no problems with cough or shortness of breath.  She has had no rashes.  Para she still is walking.  She is outside quite a bit.  Overall, I would say performance status is ECOG 0.    Medications:  Current Outpatient Medications:  .  ASPIRIN 81 PO, Take by mouth daily., Disp: , Rfl:  .  fondaparinux (ARIXTRA) 7.5 MG/0.6ML SOLN injection, Inject 0.6 mLs (7.5 mg total) into the skin daily., Disp: 30 mL, Rfl: 3 .  rosuvastatin (CRESTOR) 20 MG tablet, Take 20 mg by mouth daily., Disp: , Rfl:  .  rosuvastatin (CRESTOR) 40 MG tablet, Take 40 mg by mouth daily. Take 1/2 tablet, total of 20 mg by mouth daily., Disp: , Rfl:  .  thyroid (ARMOUR THYROID) 60 MG tablet, TAKE 1 TABLET BY MOUTH EVERY DAY BEFORE BREAKFAST. TAKE 1 DAILY 5 DAYS PER WEEK, Disp: 90 tablet, Rfl: 2 .  thyroid (ARMOUR THYROID) 90 MG tablet, TAKE 1 TABLET BY MOUTH EVERY DAY TWO DAYS PER WEEK, Disp: 90 tablet, Rfl: 1  Allergies:  Allergies  Allergen Reactions  . Bee Venom Anaphylaxis and Swelling  . Adhesive [Tape] Other (See Comments)    Irritation and red  . Aspartame Diarrhea  . Aspartame And Phenylalanine Diarrhea    Past  Medical History, Surgical history, Social history, and Family History were reviewed and updated.  Review of Systems: Review of Systems  Constitutional: Negative.   HENT: Negative.   Eyes: Negative.   Respiratory: Negative.   Cardiovascular: Negative.   Gastrointestinal: Positive for abdominal pain.  Genitourinary: Negative.   Musculoskeletal: Negative.   Skin: Negative.   Neurological: Negative.   Endo/Heme/Allergies: Negative.   Psychiatric/Behavioral: Negative.     Physical Exam:  weight is 131 lb (59.4 kg). Her oral temperature is 98.2 F (36.8 C). Her blood pressure is 103/65 and her pulse is 69. Her respiration is 16 and oxygen saturation is 100%.   Wt Readings from Last 3 Encounters:  01/19/20 131 lb (59.4 kg)  01/14/20 131 lb (59.4 kg)  12/03/19 136 lb 0.6 oz (61.7 kg)    Physical Exam Vitals reviewed.  HENT:     Head: Normocephalic and atraumatic.  Eyes:     Pupils: Pupils are equal, round, and reactive to light.  Cardiovascular:     Rate and Rhythm: Normal rate and regular rhythm.     Heart sounds: Normal heart sounds.  Pulmonary:     Effort: Pulmonary effort is normal.     Breath sounds: Normal breath sounds.  Abdominal:     General: Bowel sounds are normal.     Palpations: Abdomen is soft.     Comments: Abdominal exam shows a soft abdomen.  She has well healing laparoscopic scars.  I think there are 5 laparoscopic scars.  She has no erythema or exudate or warmth associated with these.  She has no fluid wave in the abdomen.  There is no guarding or rebound tenderness.  She has no palpable liver or spleen tip.  Musculoskeletal:        General: No tenderness or deformity. Normal range of motion.     Cervical back: Normal range of motion.  Lymphadenopathy:     Cervical: No cervical adenopathy.  Skin:    General: Skin is warm and dry.     Findings: No erythema or rash.  Neurological:     Mental Status: She is alert and oriented to person, place, and time.    Psychiatric:        Behavior: Behavior normal.        Thought Content: Thought content normal.        Judgment: Judgment normal.      Lab Results  Component Value Date   WBC 4.8 01/14/2020   HGB 11.8 (L) 01/14/2020   HCT 36.3 01/14/2020   MCV 88.8 01/14/2020  PLT 258 01/14/2020     Chemistry      Component Value Date/Time   NA 140 01/14/2020 1534   NA 142 02/15/2017 1518   NA 140 04/13/2016 1055   K 3.8 01/14/2020 1534   K 4.1 02/15/2017 1518   K 4.1 04/13/2016 1055   CL 104 01/14/2020 1534   CL 107 02/15/2017 1518   CO2 27 01/14/2020 1534   CO2 29 02/15/2017 1518   CO2 27 04/13/2016 1055   BUN 10 01/14/2020 1534   BUN 17 02/15/2017 1518   BUN 22.1 04/13/2016 1055   CREATININE 0.74 01/14/2020 1534   CREATININE 1.0 02/15/2017 1518   CREATININE 0.8 04/13/2016 1055      Component Value Date/Time   CALCIUM 9.9 01/14/2020 1534   CALCIUM 9.3 02/15/2017 1518   CALCIUM 9.9 04/13/2016 1055   ALKPHOS 62 01/14/2020 1534   ALKPHOS 79 02/15/2017 1518   ALKPHOS 81 04/13/2016 1055   AST 22 01/14/2020 1534   AST 20 04/13/2016 1055   ALT 16 01/14/2020 1534   ALT 24 02/15/2017 1518   ALT 22 04/13/2016 1055   BILITOT 0.5 01/14/2020 1534   BILITOT 0.44 04/13/2016 1055      Impression and Plan: Ms. Starlin is a 59 year old Caucasian female.  She has had issues with recurrence of thromboembolic events.  She has had these systemically and in the brain.  I still think that the "key" to this tumor is the fact that it is BRCA2 positive.  I know she had the genetic test for BRCA and was negative.  As such this is a somatic mutation that is in the cancer.  I reassured her that this does not really put her at high risk for breast cancer.  It might be a little bit controversial as to when to start treatment on her.  The PET scan concerning help out on this.  Imaging the PET scan does not show [place] anything, she clearly has disease given the elevated CA-125.  Given that fact, it would  be very reasonable to treat her without any measurable disease.  We would also be treating her in the "adjuvant" setting.  She would probably receive 4-6 cycles of treatment, depending on how well she tolerates it and then we can get her on a PARP inhibitor afterwards.  She will certainly let us know when we will start treatment.  Again she wants to wait until after she gets back from [place].  As such we probably would be looking at the middle of September at the earliest.    I talked her about the Port-A-Cath.  I told her that only nurses who are certified in [place] access should be the ones to handle the Port-A-Cath.  I know that she wants to try to use the Port-A-Cath for alternative/complementary therapies.  Again, we really have to make sure that only certified nurses in central line access will handle the Port-A-Cath.     [name], MD 8/9/20215:16 PM   ADDENDUM: I told her to stop the Arixtra on Wednesday, day before the port a cath is placed.  This should be adequate for coagulation.  Laurey Arrow

## 2020-01-20 ENCOUNTER — Encounter: Payer: Self-pay | Admitting: *Deleted

## 2020-01-20 ENCOUNTER — Telehealth: Payer: Self-pay | Admitting: Hematology & Oncology

## 2020-01-20 ENCOUNTER — Inpatient Hospital Stay: Payer: 59

## 2020-01-20 DIAGNOSIS — C801 Malignant (primary) neoplasm, unspecified: Secondary | ICD-10-CM | POA: Diagnosis not present

## 2020-01-20 DIAGNOSIS — C541 Malignant neoplasm of endometrium: Secondary | ICD-10-CM

## 2020-01-20 LAB — URINALYSIS, COMPLETE (UACMP) WITH MICROSCOPIC
Bacteria, UA: NONE SEEN
Bilirubin Urine: NEGATIVE
Glucose, UA: NEGATIVE mg/dL
Hgb urine dipstick: NEGATIVE
Ketones, ur: NEGATIVE mg/dL
Leukocytes,Ua: NEGATIVE
Nitrite: NEGATIVE
Protein, ur: NEGATIVE mg/dL
Specific Gravity, Urine: <1.005 — ABNORMAL LOW (ref 1.005–1.030)
WBC, UA: NONE SEEN WBC/hpf (ref 0–5)
pH: 7 (ref 5.0–8.0)

## 2020-01-20 NOTE — Addendum Note (Signed)
Addended by: Burney Gauze R on: 01/20/2020 09:02 AM   Modules accepted: Orders

## 2020-01-20 NOTE — Telephone Encounter (Signed)
No los 8/9

## 2020-01-22 ENCOUNTER — Ambulatory Visit (HOSPITAL_COMMUNITY)
Admission: RE | Admit: 2020-01-22 | Discharge: 2020-01-22 | Disposition: A | Discharge status: Home / Self Care | Payer: 59 | Source: Ambulatory Visit | Attending: Hematology & Oncology | Admitting: Hematology & Oncology

## 2020-01-22 ENCOUNTER — Other Ambulatory Visit: Payer: Self-pay

## 2020-01-22 ENCOUNTER — Encounter (HOSPITAL_COMMUNITY): Payer: Self-pay

## 2020-01-22 ENCOUNTER — Other Ambulatory Visit: Payer: Self-pay | Admitting: Hematology & Oncology

## 2020-01-22 DIAGNOSIS — C541 Malignant neoplasm of endometrium: Secondary | ICD-10-CM | POA: Insufficient documentation

## 2020-01-22 DIAGNOSIS — E039 Hypothyroidism, unspecified: Secondary | ICD-10-CM | POA: Insufficient documentation

## 2020-01-22 DIAGNOSIS — Z86718 Personal history of other venous thrombosis and embolism: Secondary | ICD-10-CM | POA: Diagnosis not present

## 2020-01-22 DIAGNOSIS — Z79890 Hormone replacement therapy (postmenopausal): Secondary | ICD-10-CM | POA: Diagnosis not present

## 2020-01-22 DIAGNOSIS — Z79899 Other long term (current) drug therapy: Secondary | ICD-10-CM | POA: Diagnosis not present

## 2020-01-22 DIAGNOSIS — Z7982 Long term (current) use of aspirin: Secondary | ICD-10-CM | POA: Diagnosis not present

## 2020-01-22 DIAGNOSIS — Z8673 Personal history of transient ischemic attack (TIA), and cerebral infarction without residual deficits: Secondary | ICD-10-CM | POA: Diagnosis not present

## 2020-01-22 DIAGNOSIS — Z7901 Long term (current) use of anticoagulants: Secondary | ICD-10-CM | POA: Diagnosis not present

## 2020-01-22 HISTORY — PX: IR IMAGING GUIDED PORT INSERTION: IMG5740

## 2020-01-22 MED ORDER — FENTANYL CITRATE (PF) 100 MCG/2ML IJ SOLN
INTRAMUSCULAR | Status: AC | PRN
  Administered 2020-01-22 (×2): 50 ug via INTRAVENOUS

## 2020-01-22 MED ORDER — MIDAZOLAM HCL 2 MG/2ML IJ SOLN
INTRAMUSCULAR | Status: AC | PRN
  Administered 2020-01-22 (×3): 1 mg via INTRAVENOUS

## 2020-01-22 MED ORDER — FENTANYL CITRATE (PF) 100 MCG/2ML IJ SOLN
INTRAMUSCULAR | Status: AC
  Filled 2020-01-22: qty 2

## 2020-01-22 MED ORDER — MIDAZOLAM HCL 2 MG/2ML IJ SOLN
INTRAMUSCULAR | Status: AC
  Filled 2020-01-22: qty 4

## 2020-01-22 MED ORDER — HEPARIN SOD (PORK) LOCK FLUSH 100 UNIT/ML IV SOLN
INTRAVENOUS | Status: AC
  Filled 2020-01-22: qty 5

## 2020-01-22 MED ORDER — HEPARIN SOD (PORK) LOCK FLUSH 100 UNIT/ML IV SOLN
INTRAVENOUS | Status: AC | PRN
  Administered 2020-01-22: 500 [IU] via INTRAVENOUS

## 2020-01-22 MED ORDER — CEFAZOLIN SODIUM-DEXTROSE 2-4 GM/100ML-% IV SOLN: 2.0000 g | Freq: Once | INTRAVENOUS | Status: AC

## 2020-01-22 MED ORDER — LIDOCAINE HCL 1 % IJ SOLN
INTRAMUSCULAR | Status: AC
  Filled 2020-01-22: qty 20

## 2020-01-22 MED ORDER — CEFAZOLIN SODIUM-DEXTROSE 2-4 GM/100ML-% IV SOLN
INTRAVENOUS | Status: AC
  Administered 2020-01-22: 2 g via INTRAVENOUS
  Filled 2020-01-22: qty 100

## 2020-01-22 MED ORDER — LIDOCAINE HCL (PF) 1 % IJ SOLN
INTRAMUSCULAR | Status: AC | PRN
  Administered 2020-01-22: 10 mL via INTRADERMAL

## 2020-01-22 NOTE — Discharge Instructions (Addendum)
Do not use lidocaine cream (EMLA cream) on your port for two weeks or until the wound has healed. The petroleum jelly in the lidocaine cream will dissolve the skin glue. The incision over your new port will come apart and result in an infection.   Implanted Port Insertion, Care After This sheet gives you information about how to care for yourself after your procedure. Your health care provider may also give you more specific instructions. If you have problems or questions, contact your health care provider. What can I expect after the procedure? After the procedure, it is common to have:  Discomfort at the port insertion site.  Bruising on the skin over the port. This should improve over 3-4 days. Follow these instructions at home: [facility] care  After your port is placed, you will get a manufacturer's information card. The card has information about your port. Keep this card with you at all times.  Take care of the port as told by your health care provider. Ask your health care provider if you or a family member can get training for taking care of the port at home. A home health care nurse may also take care of the port.  Make sure to remember what type of port you have. Incision care      Follow instructions from your health care provider about how to take care of your port insertion site. Make sure you: ? Wash your hands with soap and water before and after you change your bandage (dressing). If soap and water are not available, use hand sanitizer. ? Change your dressing as told by your health care provider.  Leave  skin glue in place. These skin closures may need to stay in place for 2 weeks or longer.   Check your port insertion site every day for signs of infection. Check for: ? Redness, swelling, or pain. ? Fluid or blood. ? Warmth. ? Pus or a bad smell. Activity  Return to your normal activities as told by your health care provider. Ask your health care provider what  activities are safe for you.  Do not lift anything that is heavier than 10 lb (4.5 kg), or the limit that you are told, until your health care provider says that it is safe. General instructions  Take over-the-counter and prescription medicines only as told by your health care provider.  Do not take baths, swim, or use a hot tub until your health care provider approves.  You may remove dressing tomorrow and shower.  Do not drive for 24 hours if you were given a sedative during your procedure.  Wear a medical alert bracelet in case of an emergency. This will tell any health care providers that you have a port.  Keep all follow-up visits as told by your health care provider. This is important. Contact a health care provider if:  You cannot flush your port with saline as directed, or you cannot draw blood from the port.  You have a fever or chills.  You have redness, swelling, or pain around your port insertion site.  You have fluid or blood coming from your port insertion site.  Your port insertion site feels warm to the touch.  You have pus or a bad smell coming from the port insertion site. Get help right away if:  You have chest pain or shortness of breath.  You have bleeding from your port that you cannot control. Summary  Take care of the port as told by your  health care provider. Keep the manufacturer's information card with you at all times.  Change your dressing as told by your health care provider.  Contact a health care provider if you have a fever or chills or if you have redness, swelling, or pain around your port insertion site.  Keep all follow-up visits as told by your health care provider. This information is not intended to replace advice given to you by your health care provider. Make sure you discuss any questions you have with your health care provider. Document Revised: 12/25/2017 Document Reviewed: 12/25/2017 Elsevier Patient Education  [place].    Moderate Conscious Sedation, Adult, Care After These instructions provide you with information about caring for yourself after your procedure. Your health care provider may also give you more specific instructions. Your treatment has been planned according to current medical practices, but problems sometimes occur. Call your health care provider if you have any problems or questions after your procedure. What can I expect after the procedure? After your procedure, it is common:  To feel sleepy for several hours.  To feel clumsy and have poor balance for several hours.  To have poor judgment for several hours.  To vomit if you eat too soon. Follow these instructions at home: For at least 24 hours after the procedure:   Do not: ? Participate in activities where you could fall or become injured. ? Drive. ? Use heavy machinery. ? Drink alcohol. ? Take sleeping pills or medicines that cause drowsiness. ? Make important decisions or sign legal documents. ? Take care of children on your own.  Rest. Eating and drinking  Follow the diet recommended by your health care provider.  If you vomit: ? Drink water, juice, or soup when you can drink without vomiting. ? Make sure you have little or no nausea before eating solid foods. General instructions  Have a responsible adult stay with you until you are awake and alert.  Take over-the-counter and prescription medicines only as told by your health care provider.  If you smoke, do not smoke without supervision.  Keep all follow-up visits as told by your health care provider. This is important. Contact a health care provider if:  You keep feeling nauseous or you keep vomiting.  You feel light-headed.  You develop a rash.  You have a fever. Get help right away if:  You have trouble breathing. This information is not intended to replace advice given to you by your health care provider. Make sure you discuss any  questions you have with your health care provider. Document Revised: 05/11/2017 Document Reviewed: 09/18/2015 Elsevier Patient Education  2020 [employer].

## 2020-01-22 NOTE — Procedures (Signed)
Interventional Radiology Procedure Note  Procedure: Single Lumen Power Port Placement    Access:  Right IJ vein.  Findings: Catheter tip positioned at SVC/RA junction. Port is ready for immediate use.   Complications: None  EBL: < 10 mL  Recommendations:  - Ok to shower in 24 hours - Do not submerge for 7 days - Routine line care   Ellie Bryand T. Kaylen Nghiem, M.D Pager:  [phone]   

## 2020-01-22 NOTE — H&P (Signed)
Chief Complaint: Patient was seen in consultation today for endometrial cancer/Port-a-cath placement.  Referring Physician(s): Ennever,Peter R  Supervising Physician: Aletta Edouard  Patient Status: [facility] - Out-pt  History of Present Illness: Sherry Williams is a 59 y.o. 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 She was unfortunately diagnosed with endometrial cancer in 2018. Her cancer is managed by Dr. Alycia Rossetti, Dr. Berline Lopes, and Dr. Marin Olp. She underwent a robotic assisted total hysterectomy with bilateral salpingoophrectomy in OR 04/17/2017 by Dr. Alycia Rossetti. Following this, she was in remission. Recent follow-up CT revealed abdominal/pelvic lymphadenopathy. She underwent biopsy of this in OR 10/21/2019, pathology concerning for reoccurrence of endometrial cancer. BRCA2 positive. She has tentative plans to begin systemic chemotherapy as management.  IR requested by Dr. Marin Olp for possible image-guided Port-a-cath placement. Patient awake and alert sitting in bed with no complaints at this time. Denies fever, chills, chest pain, dyspnea, abdominal pain, or headache.  LD Arixtra 01/20/2020.   Past Medical History:  Diagnosis Date  . Abnormal genetic test   . Allergy   . Arthritis   . DVT (deep venous thrombosis) ([place])    lower extremity   . Endometrial ca ([place]) 03/14/2017   uterine cancer 2018  . Family history of adverse reaction to anesthesia    children had N/V   . Family history of breast cancer   . Family history of pancreatic cancer   . Family history of prostate cancer in father   . Goals of care, counseling/discussion 12/10/2019  . History of kidney stones   . Hypothyroidism    Hashimoto  . Lichen sclerosus   . MRSA (methicillin resistant staph aureus) culture positive   . Pneumonia    walking  . Pulmonary embolism  ([place]) 01/13/2016   After car trip  . Stroke [facility])    multiple     2-16 -2021, 4-15- 2021 no defecits   do to blood clots    Past Surgical History:  Procedure Laterality Date  . ABDOMINAL HYSTERECTOMY    . BUBBLE STUDY  07/31/2019   Procedure: BUBBLE STUDY;  Surgeon: [name], MD;  Location: [place];  Service: Cardiovascular;;  . CYSTOSCOPY WITH RETROGRADE PYELOGRAM, URETEROSCOPY AND STENT PLACEMENT Left 05/18/2018   Procedure: CYSTOSCOPY WITH RETROGRADE PYELOGRAM, LEFT URETEROSCOPY AND LEFT URETERAL STENT PLACEMENT;  Surgeon: [name], MD;  Location: [facility];  Service: Urology;  Laterality: Left;  . CYSTOSCOPY WITH RETROGRADE PYELOGRAM, URETEROSCOPY AND STENT PLACEMENT Right 05/30/2018   Procedure: CYSTOSCOPY WITH RIGHT RETROGRADE PYELOGRAM, URETEROSCOPY LASER LITHOTRIPSY AND STENT PLACEMENT;  Surgeon: [name], MD;  Location: [facility];  Service: Urology;  Laterality: Right;  . HOLMIUM LASER APPLICATION Left 99/08/7167   Procedure: HOLMIUM LASER APPLICATION;  Surgeon: [name], MD;  Location: [facility];  Service: Urology;  Laterality: Left;  . HOLMIUM LASER APPLICATION Right 67/89/3810   Procedure: HOLMIUM LASER APPLICATION;  Surgeon: [name], MD;  Location: [facility];  Service: Urology;  Laterality: Right;  . MOUTH SURGERY    . ROBOTIC ASSISTED TOTAL HYSTERECTOMY WITH BILATERAL SALPINGO OOPHERECTOMY Bilateral 04/17/2017   Procedure: XI ROBOTIC ASSISTED TOTAL HYSTERECTOMY WITH BILATERAL SALPINGO OOPHORECTOMY WITH SENTINAL LYMPH NODE BIOPSY AND POSSIBLE LYMPHADENECTOMY;  Surgeon: [name], MD;  Location: [facility];  Service: Gynecology;  Laterality: Bilateral;  . ROBOTIC PELVIC AND PARA-AORTIC LYMPH NODE DISSECTION N/A 10/21/2019  Procedure: XI ROBOTIC PELVIC AND PARA-AORTIC LYMPH NODE DISSECTION;  Surgeon: Lafonda Mosses, MD;  Location: [facility];  Service: Gynecology;  Laterality: N/A;  . TEE WITHOUT  CARDIOVERSION N/A 07/31/2019   Procedure: TRANSESOPHAGEAL ECHOCARDIOGRAM (TEE);  Surgeon: [name], MD;  Location: [facility] ENDOSCOPY;  Service: Cardiovascular;  Laterality: N/A;    Allergies: Bee venom, Adhesive [tape], Aspartame, and Aspartame and phenylalanine  Medications: Prior to Admission medications   Medication Sig Start Date End Date Taking? Authorizing Provider  ASPIRIN 81 PO Take by mouth daily.    [provider]  fondaparinux (ARIXTRA) 7.5 MG/0.6ML SOLN injection Inject 0.6 mLs (7.5 mg total) into the skin daily. 01/01/20 01/31/20  [name], MD  rosuvastatin (CRESTOR) 20 MG tablet Take 20 mg by mouth daily.    [provider]  rosuvastatin (CRESTOR) 40 MG tablet Take 40 mg by mouth daily. Take 1/2 tablet, total of 20 mg by mouth daily. 10/29/19 11/28/19  [provider]  thyroid (ARMOUR THYROID) 60 MG tablet TAKE 1 TABLET BY MOUTH EVERY DAY BEFORE BREAKFAST. TAKE 1 DAILY 5 DAYS PER WEEK 12/03/19   Burchette, Alinda Sierras, MD  thyroid (ARMOUR THYROID) 90 MG tablet TAKE 1 TABLET BY MOUTH EVERY DAY TWO DAYS PER WEEK 12/03/19   Burchette, Alinda Sierras, MD     Family History  Problem Relation Age of Onset  . Stroke Father   . Alzheimer's disease Father   . Hyperlipidemia Father   . Prostate cancer Father   . Cancer Father        in his nose cartiage  . Deep vein thrombosis Sister   . Other Sister        celiac sprue  . Breast cancer Paternal Grandmother        In her 60s, deceased at 108; bilateral mastectomy   . Arthritis Mother   . Lung cancer Maternal Grandmother   . Pancreatic cancer Maternal Grandmother        deceased in late 62s  . Diabetes Paternal Grandfather   . Pancreatic cancer Paternal Grandfather     Social History   Socioeconomic History  . Marital status: Married    Spouse name: Not on file  . Number of children: 4  . Years of education: Not on file  . Highest education level: Not on file  Occupational History  . Occupation:  Press photographer  Tobacco Use  . Smoking status: Never Smoker  . Smokeless tobacco: Never Used  Vaping Use  . Vaping Use: Never used  Substance and Sexual Activity  . Alcohol use: Yes    Alcohol/week: 1.0 standard drink    Types: 1 Glasses of wine per week    Comment: occassional  . Drug use: No  . Sexual activity: Yes    Birth control/protection: Surgical  Other Topics Concern  . Not on file  Social History Narrative  . Not on file   Social Determinants of Health   Financial Resource Strain:   . Difficulty of Paying Living Expenses:   Food Insecurity:   . Worried About Charity fundraiser in the Last Year:   . Arboriculturist in the Last Year:   Transportation Needs:   . Film/video editor (Medical):   Marland Kitchen Lack of Transportation (Non-Medical):   Physical Activity:   . Days of Exercise per Week:   . Minutes of Exercise per Session:   Stress:   . Feeling of Stress :   Social Connections:   .  Frequency of Communication with Friends and Family:   . Frequency of Social Gatherings with Friends and Family:   . Attends Religious Services:   . Active Member of Clubs or Organizations:   . Attends Archivist Meetings:   Marland Kitchen Marital Status:      Review of Systems: A 12 point ROS discussed and pertinent positives are indicated in the HPI above.  All other systems are negative.  Review of Systems  Constitutional: Negative for chills and fever.  Respiratory: Negative for shortness of breath and wheezing.   Cardiovascular: Negative for chest pain and palpitations.  Gastrointestinal: Negative for abdominal pain.  Neurological: Negative for headaches.  Psychiatric/Behavioral: Negative for behavioral problems and confusion.    Vital Signs: BP 113/67   Pulse 71   Temp 98.8 F (37.1 C) (Oral)   Resp 16   LMP 09/04/2012   SpO2 97%   Physical Exam Vitals and nursing note reviewed.  Constitutional:      General: She is not in acute distress.    Appearance: Normal  appearance.  Cardiovascular:     Rate and Rhythm: Normal rate and regular rhythm.     Heart sounds: Normal heart sounds. No murmur heard.   Pulmonary:     Effort: Pulmonary effort is normal. No respiratory distress.     Breath sounds: Normal breath sounds. No wheezing.  Skin:    General: Skin is warm and dry.  Neurological:     Mental Status: She is alert and oriented to person, place, and time.      MD Evaluation Airway: WNL Heart: WNL Abdomen: WNL Chest/ Lungs: WNL ASA  Classification: 3 Mallampati/Airway Score: One   Imaging: US Venous Img Upper Uni Right(DVT)  Result Date: 01/14/2020 CLINICAL DATA:  59 year old female with right arm findings concerning for thrombophlebitis EXAM: RIGHT UPPER EXTREMITY VENOUS DOPPLER ULTRASOUND TECHNIQUE: Gray-scale sonography with graded compression, as well as color Doppler and duplex ultrasound were performed to evaluate the upper extremity deep venous system from the level of the subclavian vein and including the jugular, axillary, basilic, radial, ulnar and upper cephalic vein. Spectral Doppler was utilized to evaluate flow at rest and with distal augmentation maneuvers. COMPARISON:  None. FINDINGS: Contralateral Subclavian Vein: Respiratory phasicity is normal and symmetric with the symptomatic side. No evidence of thrombus. Normal compressibility. Internal Jugular Vein: No evidence of thrombus. Normal compressibility, respiratory phasicity and response to augmentation. Subclavian Vein: No evidence of thrombus. Normal compressibility, respiratory phasicity and response to augmentation. Axillary Vein: No evidence of thrombus. Normal compressibility, respiratory phasicity and response to augmentation. Cephalic Vein: Noncompressible cephalic vein of the upper arm. No flow with thrombus visualized. Basilic Vein: No evidence of thrombus. Normal compressibility, respiratory phasicity and response to augmentation. Brachial Veins: No evidence of thrombus.  Normal compressibility, respiratory phasicity and response to augmentation. Radial Veins: No evidence of thrombus. Normal compressibility, respiratory phasicity and response to augmentation. Ulnar Veins: No evidence of thrombus. Normal compressibility, respiratory phasicity and response to augmentation. Other Findings:  None visualized. IMPRESSION: Sonographic survey of the right upper extremity negative for DVT. Duplex is positive for superficial thrombophlebitis of the right cephalic vein. Electronically Signed   By: Corrie Mckusick D.O.   On: 01/14/2020 17:05    Labs:  CBC: Recent Labs    09/25/19 2149 10/13/19 1327 10/31/19 0752 01/14/20 1534  WBC 4.4 4.0 4.1 4.8  HGB 13.4 12.3 12.9 11.8*  HCT 42.0 39.1 39.4 36.3  PLT 283 274 277 258  COAGS: Recent Labs    07/29/19 1102 07/29/19 1102 07/29/19 2344 07/30/19 0945 07/30/19 2103 07/31/19 0315  INR 1.2  --   --   --   --   --   APTT 28   < > 57* 127* 66* 99*   < > = values in this interval not displayed.    BMP: Recent Labs    09/25/19 2149 10/13/19 1327 10/31/19 0752 01/14/20 1534  NA 142 142 141 140  K 3.7 4.1 4.1 3.8  CL 108 109 104 104  CO2 _0 GLUCOSE 70 145* 87 101*  BUN _1 CALCIUM 9.3 8.9 10.1 9.9  CREATININE 0.65 0.69 0.88 0.74  GFRNONAA >60 >60 >60 >60  GFRAA >60 >60 >60 >60    LIVER FUNCTION TESTS: Recent Labs    08/11/19 1054 09/15/19 1522 10/31/19 0752 01/14/20 1534  BILITOT 0.4 0.4 0.5 0.5  AST _2 ALT _3 ALKPHOS 62 64 54 62  PROT 7.1 6.7 7.2 7.3  ALBUMIN 4.6 4.3 4.3 4.7     Assessment and Plan:  Recurrent endometrial cancer with tentative plans to begin systemic chemotherapy as management. Plan for image-guided Port-a-cath placement today in IR. Patient is NPO. Afebrile.  Risks and benefits of image-guided Port-a-catheter placement were discussed with the patient including, but not limited to bleeding, infection, pneumothorax, or fibrin sheath  development and need for additional procedures. All of the patient's questions were answered, patient is agreeable to proceed. Consent signed and in chart.   Thank you for this interesting consult.  I greatly enjoyed meeting Sherry Williams and look forward to participating in their care.  A copy of this report was sent to the requesting provider on this date.  Electronically Signed: Earley Abide, PA-C 01/22/2020, 12:57 PM   I spent a total of 30 Minutes in face to face in clinical consultation, greater than 50% of which was counseling/coordinating care for endometrial cancer/Port-a-cath placement.

## 2020-01-24 ENCOUNTER — Other Ambulatory Visit: Payer: Self-pay | Admitting: Hematology & Oncology

## 2020-01-28 ENCOUNTER — Other Ambulatory Visit: Payer: Self-pay

## 2020-01-28 ENCOUNTER — Encounter: Payer: Self-pay | Admitting: Hematology & Oncology

## 2020-01-28 ENCOUNTER — Encounter (HOSPITAL_COMMUNITY)
Admission: RE | Admit: 2020-01-28 | Discharge: 2020-01-28 | Disposition: A | Discharge status: Home / Self Care | Payer: 59 | Source: Ambulatory Visit | Attending: Hematology & Oncology | Admitting: Hematology & Oncology

## 2020-01-28 ENCOUNTER — Inpatient Hospital Stay (HOSPITAL_BASED_OUTPATIENT_CLINIC_OR_DEPARTMENT_OTHER): Payer: 59 | Admitting: Hematology & Oncology

## 2020-01-28 VITALS — BP 119/67 | HR 73 | Temp 98.1°F | Resp 18 | Wt 131.0 lb

## 2020-01-28 DIAGNOSIS — C541 Malignant neoplasm of endometrium: Secondary | ICD-10-CM | POA: Diagnosis not present

## 2020-01-28 DIAGNOSIS — C801 Malignant (primary) neoplasm, unspecified: Secondary | ICD-10-CM | POA: Diagnosis not present

## 2020-01-28 LAB — GLUCOSE, CAPILLARY: Glucose-Capillary: 79 mg/dL (ref 70–99)

## 2020-01-28 MED ORDER — FLUDEOXYGLUCOSE F - 18 (FDG) INJECTION
6.5500 | Freq: Once | INTRAVENOUS | Status: AC
  Administered 2020-01-28: 6.55 via INTRAVENOUS

## 2020-01-28 NOTE — Progress Notes (Signed)
START OFF PATHWAY REGIMEN - Ovarian   [id number]:Carboplatin + Paclitaxel (5/175) q21 Days:   A cycle is every 21 days:     Paclitaxel      Carboplatin   **Always confirm dose/schedule in your pharmacy ordering system**  Patient Characteristics: Recurrent or Progressive Disease, Biochemical Relapse Therapeutic Status: Recurrent or Progressive Disease BRCA Mutation Status: Present (Somatic) Line of Therapy: Biochemical Relapse  Intent of Therapy: Non-Curative / Palliative Intent, Discussed with Patient

## 2020-01-28 NOTE — Progress Notes (Signed)
Hematology and Oncology Follow Up Visit  Sherry Williams [id number] 13-Sep-1960 59 y.o. 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  We did get an PET scan on her today.  Unfortunately, the PET scan clearly shows that her disease is active.  I am most worried about the finding that she seems to have bilateral hydronephrosis and hydroureter.  I am not sure as to why she would have this.  Her renal function is fine.  Her last creatinine was 0.74.  She does have other areas of adenopathy.  There is no actual organ involvement.  Her last CA-125 was elevated which certainly would explain the PET scan result.  She does have a Port-A-Cath in.  She has agreed to systemic chemotherapy with standard protocol of carboplatinum/Taxol.  However, I think that we will go to have to get this hydronephrosis sorted out.  Again even though her renal function is fine, we really need to make sure her kidneys do not have a problem.  She has seen Dr. [name] of [facility] in the past.  We will give him a call and see what he might need to do.  I would think that she would need a cystoscopy and possible stents.  I know she has had kidney stones in the past.  She and her husband are planning on going up to [place] to visit family.  They are leaving Friday.  They will be back right after Labor Day Monday.  I think that we do not have to do anything until she gets back.  There has been no cough or shortness of breath.  She has had no abdominal pain.  She has had some urinary frequency.  We did a urinalysis on her with her last visit and that turned out okay.  She has had no leg swelling.  There is been no rashes.  Overall, I would have  to say that her performance status is ECOG 1.      Medications:  Current Outpatient Medications:  .  ASPIRIN 81 PO, Take by mouth daily., Disp: , Rfl:  .  fondaparinux (ARIXTRA) 7.5 MG/0.6ML SOLN injection, ADMINISTER 0.6 ML(7.5 MG) UNDER THE SKIN DAILY, Disp: 18 mL, Rfl: 6 .  rosuvastatin (CRESTOR) 20 MG tablet, Take 20 mg by mouth daily., Disp: , Rfl:  .  thyroid (ARMOUR THYROID) 60 MG tablet, TAKE 1 TABLET BY MOUTH EVERY DAY BEFORE BREAKFAST. TAKE 1 DAILY 5 DAYS PER WEEK, Disp: 90 tablet, Rfl: 2 .  thyroid (ARMOUR THYROID) 90 MG tablet, TAKE 1 TABLET BY MOUTH EVERY DAY TWO DAYS PER WEEK, Disp: 90 tablet, Rfl: 1 .  rosuvastatin (CRESTOR) 40 MG tablet, Take 40 mg by mouth daily. Take 1/2 tablet, total of 20 mg by mouth daily., Disp: , Rfl:   Allergies:  Allergies  Allergen Reactions  . Bee Venom Anaphylaxis and Swelling  . Adhesive [Tape] Other (See Comments)    Irritation and red  . Aspartame Diarrhea  . Aspartame And Phenylalanine Diarrhea    Past Medical History, Surgical history, Social history, and Family History were reviewed and updated.  Review of Systems: Review of Systems  Constitutional: Negative.   HENT: Negative.  Eyes: Negative.   Respiratory: Negative.   Cardiovascular: Negative.   Gastrointestinal: Positive for abdominal pain.  Genitourinary: Negative.   Musculoskeletal: Negative.   Skin: Negative.   Neurological: Negative.   Endo/Heme/Allergies: Negative.   Psychiatric/Behavioral: Negative.     Physical Exam:  weight is 131 lb (59.4 kg). Her oral temperature is 98.1 F (36.7 C). Her blood pressure is 119/67 and her pulse is 73. Her respiration is 18 and oxygen saturation is 98%.   Wt Readings from Last 3 Encounters:  01/28/20 131 lb (59.4 kg)  01/19/20 131 lb (59.4 kg)  01/14/20 131 lb (59.4 kg)    Physical Exam Vitals reviewed.  HENT:     Head: Normocephalic and atraumatic.  Eyes:     Pupils: Pupils are equal, round, and reactive to light.    Cardiovascular:     Rate and Rhythm: Normal rate and regular rhythm.     Heart sounds: Normal heart sounds.  Pulmonary:     Effort: Pulmonary effort is normal.     Breath sounds: Normal breath sounds.  Abdominal:     General: Bowel sounds are normal.     Palpations: Abdomen is soft.     Comments: Abdominal exam shows a soft abdomen.  She has well healing laparoscopic scars.  I think there are 5 laparoscopic scars.  She has no erythema or exudate or warmth associated with these.  She has no fluid wave in the abdomen.  There is no guarding or rebound tenderness.  She has no palpable liver or spleen tip.  Musculoskeletal:        General: No tenderness or deformity. Normal range of motion.     Cervical back: Normal range of motion.  Lymphadenopathy:     Cervical: No cervical adenopathy.  Skin:    General: Skin is warm and dry.     Findings: No erythema or rash.  Neurological:     Mental Status: She is alert and oriented to person, place, and time.  Psychiatric:        Behavior: Behavior normal.        Thought Content: Thought content normal.        Judgment: Judgment normal.      Lab Results  Component Value Date   WBC 4.8 01/14/2020   HGB 11.8 (L) 01/14/2020   HCT 36.3 01/14/2020   MCV 88.8 01/14/2020   PLT 258 01/14/2020     Chemistry      Component Value Date/Time   NA 140 01/14/2020 1534   NA 142 02/15/2017 1518   NA 140 04/13/2016 1055   K 3.8 01/14/2020 1534   K 4.1 02/15/2017 1518   K 4.1 04/13/2016 1055   CL 104 01/14/2020 1534   CL 107 02/15/2017 1518   CO2 27 01/14/2020 1534   CO2 29 02/15/2017 1518   CO2 27 04/13/2016 1055   BUN 10 01/14/2020 1534   BUN 17 02/15/2017 1518   BUN 22.1 04/13/2016 1055   CREATININE 0.74 01/14/2020 1534   CREATININE 1.0 02/15/2017 1518   CREATININE 0.8 04/13/2016 1055      Component Value Date/Time   CALCIUM 9.9 01/14/2020 1534   CALCIUM 9.3 02/15/2017 1518   CALCIUM 9.9 04/13/2016 1055   ALKPHOS 62 01/14/2020 1534    ALKPHOS 79 02/15/2017 1518   ALKPHOS 81 04/13/2016 1055   AST 22 01/14/2020 1534   AST 20 04/13/2016 1055   ALT 16 01/14/2020 1534   ALT 24 02/15/2017 1518   ALT   22 04/13/2016 1055   BILITOT 0.5 01/14/2020 1534   BILITOT 0.44 04/13/2016 1055      Impression and Plan: Sherry Williams is a 60 year old Caucasian female.  She has had issues with recurrence of thromboembolic events.  She has had these systemically and in the brain.  I still think that the "key" to this tumor is the fact that it is BRCA2 positive.  I know she had the genetic test for BRCA and was negative.  As such this is a somatic mutation that is in the cancer.  I reassured her that this does not really put her at high risk for breast cancer.  She definitely needs chemotherapy at this point.  I think the PET scan is quite reassuring about what her malignancy is trying to do.  I think she will need 6 cycles of carboplatinum/Taxol.  I think this would be considered standard approach.  She should respond given that she does has lymph node involvement by her malignancy.  First, we will have to get urology to see her and to see what might be done with this hydronephrosis that is bilateral.  We will have to await urology's evaluation.  I would not think that this would happen until the week of September 12.  Once her kidneys are taken care of, then we can get her started with actual chemotherapy.  I will likely plan to see her when she is starts her treatments.  [name], MD 8/18/20214:59 PM

## 2020-01-29 ENCOUNTER — Other Ambulatory Visit: Payer: Self-pay | Admitting: Pharmacist

## 2020-01-29 ENCOUNTER — Telehealth: Payer: Self-pay | Admitting: Hematology & Oncology

## 2020-01-29 NOTE — Progress Notes (Signed)
Aloxi changed to ondansetron per [facility] formulary. Dose will be ondansetron 16 mg per Dr. Marin Olp, changed per his instructions.

## 2020-01-29 NOTE — Telephone Encounter (Signed)
No los 8/18

## 2020-02-10 ENCOUNTER — Encounter: Payer: Self-pay | Admitting: Hematology & Oncology

## 2020-02-11 ENCOUNTER — Telehealth: Payer: Self-pay | Admitting: Hematology & Oncology

## 2020-02-11 NOTE — Telephone Encounter (Signed)
I called [facility] and was able to get appointment w/ Dr [name] for 9/2 @ 12:45.  Patient advised that she was out of town until 9/6 and would need appointment rescheduled.  I gave her [facility]'s phone number [phone] to call and have appt moved .  She was ok with these instructions

## 2020-02-20 NOTE — Progress Notes (Signed)
Pharmacist Chemotherapy Monitoring - Initial Assessment    Anticipated start date: 02/24/20  Regimen:   Are orders appropriate based on the patients diagnosis, regimen, and cycle? Yes  Does the plan date match the patients scheduled date? Yes  Is the sequencing of drugs appropriate? Yes  Are the premedications appropriate for the patients regimen? Yes  Prior Authorization for treatment is: Approved o If applicable, is the correct biosimilar selected based on the patient's insurance? not applicable  Organ Function and Labs:  Are dose adjustments needed based on the patient's renal function, hepatic function, or hematologic function? No  Are appropriate labs ordered prior to the start of patient's treatment? Yes  Other organ system assessment, if indicated: N/A  The following baseline labs, if indicated, have been ordered: N/A  Dose Assessment:  Are the drug doses appropriate? Yes  Are the following correct: o Drug concentrations Yes o IV fluid compatible with drug Yes o Administration routes Yes o Timing of therapy Yes  If applicable, does the patient have documented access for treatment and/or plans for port-a-cath placement? yes  If applicable, have lifetime cumulative doses been properly documented and assessed? not applicable Lifetime Dose Tracking  No doses have been documented on this patient for the following tracked chemicals: Doxorubicin, Epirubicin, Idarubicin, Daunorubicin, Mitoxantrone, Bleomycin, Oxaliplatin, Carboplatin, Liposomal Doxorubicin  o   Toxicity Monitoring/Prevention:  The patient has the following take home antiemetics prescribed: Ondansetron /prochlorperazine/lorazepam/dexamethasone  The patient has the following take home medications prescribed: N/A   Medication allergies and previous infusion related reactions, if applicable, have been reviewed and addressed. Yes  The patient's current medication list has been assessed for drug-drug  interactions with their chemotherapy regimen. no significant drug-drug interactions were identified on review.  Order Review:  Are the treatment plan orders signed? No  Is the patient scheduled to see a provider prior to their treatment? No  I verify that I have reviewed each item in the above checklist and answered each question accordingly.  Brittie Whisnant, Jacqlyn Larsen 02/20/2020 12:13 PM

## 2020-02-23 ENCOUNTER — Encounter: Payer: Self-pay | Admitting: Hematology & Oncology

## 2020-02-24 ENCOUNTER — Other Ambulatory Visit: Payer: Self-pay

## 2020-02-24 ENCOUNTER — Inpatient Hospital Stay: Payer: 59

## 2020-02-24 ENCOUNTER — Other Ambulatory Visit: Payer: Self-pay | Admitting: *Deleted

## 2020-02-24 ENCOUNTER — Encounter: Payer: Self-pay | Admitting: Nutrition

## 2020-02-24 ENCOUNTER — Other Ambulatory Visit: Payer: Self-pay | Admitting: Hematology & Oncology

## 2020-02-24 ENCOUNTER — Encounter: Payer: Self-pay | Admitting: *Deleted

## 2020-02-24 ENCOUNTER — Inpatient Hospital Stay: Payer: 59 | Attending: Hematology & Oncology

## 2020-02-24 VITALS — BP 112/65 | HR 58 | Temp 98.2°F | Resp 16

## 2020-02-24 DIAGNOSIS — Z5111 Encounter for antineoplastic chemotherapy: Secondary | ICD-10-CM | POA: Insufficient documentation

## 2020-02-24 DIAGNOSIS — I6309 Cerebral infarction due to thrombosis of other precerebral artery: Secondary | ICD-10-CM

## 2020-02-24 DIAGNOSIS — C801 Malignant (primary) neoplasm, unspecified: Secondary | ICD-10-CM | POA: Insufficient documentation

## 2020-02-24 DIAGNOSIS — I2609 Other pulmonary embolism with acute cor pulmonale: Secondary | ICD-10-CM

## 2020-02-24 DIAGNOSIS — C541 Malignant neoplasm of endometrium: Secondary | ICD-10-CM

## 2020-02-24 DIAGNOSIS — E063 Autoimmune thyroiditis: Secondary | ICD-10-CM

## 2020-02-24 LAB — CMP (CANCER CENTER ONLY)
ALT: 21 U/L (ref 0–44)
AST: 27 U/L (ref 15–41)
Albumin: 4.1 g/dL (ref 3.5–5.0)
Alkaline Phosphatase: 51 U/L (ref 38–126)
Anion gap: 12 (ref 5–15)
BUN: 15 mg/dL (ref 6–20)
CO2: 24 mmol/L (ref 22–32)
Calcium: 9.5 mg/dL (ref 8.9–10.3)
Chloride: 103 mmol/L (ref 98–111)
Creatinine: 0.82 mg/dL (ref 0.44–1.00)
GFR, Est AFR Am: >60 mL/min (ref >60)
GFR, Estimated: >60 mL/min (ref >60)
Glucose, Bld: 67 mg/dL — ABNORMAL LOW (ref 70–99)
Potassium: 3.6 mmol/L (ref 3.5–5.1)
Sodium: 139 mmol/L (ref 135–145)
Total Bilirubin: 0.6 mg/dL (ref 0.3–1.2)
Total Protein: 6.5 g/dL (ref 6.5–8.1)

## 2020-02-24 LAB — CBC WITH DIFFERENTIAL (CANCER CENTER ONLY)
Abs Immature Granulocytes: 0.07 10*3/uL (ref 0.00–0.07)
Basophils Absolute: 0 10*3/uL (ref 0.0–0.1)
Basophils Relative: 1 %
Eosinophils Absolute: 0.2 10*3/uL (ref 0.0–0.5)
Eosinophils Relative: 3 %
HCT: 32.9 % — ABNORMAL LOW (ref 36.0–46.0)
Hemoglobin: 10.7 g/dL — ABNORMAL LOW (ref 12.0–15.0)
Immature Granulocytes: 2 %
Lymphocytes Relative: 22 %
Lymphs Abs: 1 10*3/uL (ref 0.7–4.0)
MCH: 29 pg (ref 26.0–34.0)
MCHC: 32.5 g/dL (ref 30.0–36.0)
MCV: 89.2 fL (ref 80.0–100.0)
Monocytes Absolute: 0.6 10*3/uL (ref 0.1–1.0)
Monocytes Relative: 12 %
Neutro Abs: 2.8 10*3/uL (ref 1.7–7.7)
Neutrophils Relative %: 60 %
Platelet Count: 241 10*3/uL (ref 150–400)
RBC: 3.69 MIL/uL — ABNORMAL LOW (ref 3.87–5.11)
RDW: 14 % (ref 11.5–15.5)
WBC Count: 4.7 10*3/uL (ref 4.0–10.5)
nRBC: 0 % (ref 0.0–0.2)

## 2020-02-24 MED ORDER — SODIUM CHLORIDE 0.9 % IV SOLN
Freq: Once | INTRAVENOUS | Status: AC
  Filled 2020-02-24: qty 250

## 2020-02-24 MED ORDER — FAMOTIDINE IN NACL 20-0.9 MG/50ML-% IV SOLN
20.0000 mg | Freq: Once | INTRAVENOUS | Status: AC
  Administered 2020-02-24: 20 mg via INTRAVENOUS

## 2020-02-24 MED ORDER — HEPARIN SOD (PORK) LOCK FLUSH 100 UNIT/ML IV SOLN
500.0000 [IU] | Freq: Once | INTRAVENOUS | Status: AC | PRN
  Administered 2020-02-24: 500 [IU]
  Filled 2020-02-24: qty 5

## 2020-02-24 MED ORDER — LORAZEPAM 0.5 MG PO TABS: 0.5000 mg | ORAL_TABLET | Freq: Four times a day (QID) | ORAL | 0 refills | Status: DC | PRN

## 2020-02-24 MED ORDER — DEXAMETHASONE 4 MG PO TABS: 8.0000 mg | ORAL_TABLET | Freq: Every day | ORAL | 1 refills | Status: DC

## 2020-02-24 MED ORDER — DIPHENHYDRAMINE HCL 50 MG/ML IJ SOLN
INTRAMUSCULAR | Status: AC
  Filled 2020-02-24: qty 1

## 2020-02-24 MED ORDER — DIPHENHYDRAMINE HCL 50 MG/ML IJ SOLN
50.0000 mg | Freq: Once | INTRAMUSCULAR | Status: AC
  Administered 2020-02-24: 50 mg via INTRAVENOUS

## 2020-02-24 MED ORDER — LIDOCAINE-PRILOCAINE 2.5-2.5 % EX CREA: TOPICAL_CREAM | CUTANEOUS | 3 refills | Status: DC

## 2020-02-24 MED ORDER — PROCHLORPERAZINE MALEATE 10 MG PO TABS: 10.0000 mg | ORAL_TABLET | Freq: Four times a day (QID) | ORAL | 1 refills | Status: DC | PRN

## 2020-02-24 MED ORDER — SODIUM CHLORIDE 0.9% FLUSH
10.0000 mL | INTRAVENOUS | Status: DC | PRN
  Administered 2020-02-24: 10 mL
  Filled 2020-02-24: qty 10

## 2020-02-24 MED ORDER — FAMOTIDINE IN NACL 20-0.9 MG/50ML-% IV SOLN
INTRAVENOUS | Status: AC
  Filled 2020-02-24: qty 50

## 2020-02-24 MED ORDER — SODIUM CHLORIDE 0.9 % IV SOLN
16.0000 mg | Freq: Once | INTRAVENOUS | Status: AC
  Administered 2020-02-24: 16 mg via INTRAVENOUS
  Filled 2020-02-24: qty 8

## 2020-02-24 MED ORDER — SODIUM CHLORIDE 0.9 % IV SOLN
373.6000 mg | Freq: Once | INTRAVENOUS | Status: AC
  Administered 2020-02-24: 370 mg via INTRAVENOUS
  Filled 2020-02-24: qty 37

## 2020-02-24 MED ORDER — SODIUM CHLORIDE 0.9 % IV SOLN
150.0000 mg | Freq: Once | INTRAVENOUS | Status: AC
  Administered 2020-02-24: 150 mg via INTRAVENOUS
  Filled 2020-02-24: qty 150

## 2020-02-24 MED ORDER — ONDANSETRON HCL 8 MG PO TABS: 8.0000 mg | ORAL_TABLET | Freq: Two times a day (BID) | ORAL | 1 refills | Status: DC | PRN

## 2020-02-24 MED ORDER — SODIUM CHLORIDE 0.9 % IV SOLN
10.0000 mg | Freq: Once | INTRAVENOUS | Status: AC
  Administered 2020-02-24: 10 mg via INTRAVENOUS
  Filled 2020-02-24: qty 10

## 2020-02-24 MED ORDER — SODIUM CHLORIDE 0.9 % IV SOLN
140.0000 mg/m2 | Freq: Once | INTRAVENOUS | Status: AC
  Administered 2020-02-24: 234 mg via INTRAVENOUS
  Filled 2020-02-24: qty 39

## 2020-02-24 NOTE — Addendum Note (Signed)
Addended by: Burney Gauze R on: 02/24/2020 11:07 AM   Modules accepted: Orders

## 2020-02-24 NOTE — Progress Notes (Signed)
Pt here today for first treatment, states " I am fasting x 3 days  -day 1 (yesterday 9/13 ) to allow good cells to hibernate. Discussed with pt if she will be using herbal remedies at home for example peppermint/ginger for nausea.

## 2020-02-24 NOTE — Patient Instructions (Signed)
[facility] °Discharge Instructions for Patients Receiving Chemotherapy ° °Today you received the following chemotherapy agents Taxol and Carboplatin ° °To help prevent nausea and vomiting after your treatment, we encourage you to take your nausea medication as prescribed by MD. °  °If you develop nausea and vomiting that is not controlled by your nausea medication, call the clinic.  ° °BELOW ARE SYMPTOMS THAT SHOULD BE REPORTED IMMEDIATELY: °· *FEVER GREATER THAN 100.5 F °· *CHILLS WITH OR WITHOUT FEVER °· NAUSEA AND VOMITING THAT IS NOT CONTROLLED WITH YOUR NAUSEA MEDICATION °· *UNUSUAL SHORTNESS OF BREATH °· *UNUSUAL BRUISING OR BLEEDING °· TENDERNESS IN MOUTH AND THROAT WITH OR WITHOUT PRESENCE OF ULCERS °· *URINARY PROBLEMS °· *BOWEL PROBLEMS °· UNUSUAL RASH °Items with * indicate a potential emergency and should be followed up as soon as possible. ° °Feel free to call the clinic should you have any questions or concerns. The clinic phone number is [phone]. ° °Please show the CHEMO ALERT CARD at check-in to the Emergency Department and triage nurse. ° ° °

## 2020-02-24 NOTE — Patient Instructions (Signed)

## 2020-02-24 NOTE — Patient Instructions (Signed)
Paclitaxel injection What is this medicine? PACLITAXEL (PAK li TAX el) is a chemotherapy drug. It targets fast dividing cells, like cancer cells, and causes these cells to die. This medicine is used to treat ovarian cancer, breast cancer, lung cancer, Kaposi's sarcoma, and other cancers. This medicine may be used for other purposes; ask your health care provider or pharmacist if you have questions. COMMON BRAND NAME(S): Onxol, Taxol What should I tell my health care provider before I take this medicine? They need to know if you have any of these conditions:  history of irregular heartbeat  liver disease  low blood counts, like low white cell, platelet, or red cell counts  lung or breathing disease, like asthma  tingling of the fingers or toes, or other nerve disorder  an unusual or allergic reaction to paclitaxel, alcohol, polyoxyethylated castor oil, other chemotherapy, other medicines, foods, dyes, or preservatives  pregnant or trying to get pregnant  breast-feeding How should I use this medicine? This drug is given as an infusion into a vein. It is administered in a hospital or clinic by a specially trained health care professional. Talk to your pediatrician regarding the use of this medicine in children. Special care may be needed. Overdosage: If you think you have taken too much of this medicine contact a poison control center or emergency room at once. NOTE: This medicine is only for you. Do not share this medicine with others. What if I miss a dose? It is important not to miss your dose. Call your doctor or health care professional if you are unable to keep an appointment. What may interact with this medicine? Do not take this medicine with any of the following medications:  disulfiram  metronidazole This medicine may also interact with the following medications:  antiviral medicines for hepatitis, HIV or AIDS  certain antibiotics like erythromycin and  clarithromycin  certain medicines for fungal infections like ketoconazole and itraconazole  certain medicines for seizures like carbamazepine, phenobarbital, phenytoin  gemfibrozil  nefazodone  rifampin  St. John's wort This list may not describe all possible interactions. Give your health care provider a list of all the medicines, herbs, non-prescription drugs, or dietary supplements you use. Also tell them if you smoke, drink alcohol, or use illegal drugs. Some items may interact with your medicine. What should I watch for while using this medicine? Your condition will be monitored carefully while you are receiving this medicine. You will need important blood work done while you are taking this medicine. This medicine can cause serious allergic reactions. To reduce your risk you will need to take other medicine(s) before treatment with this medicine. If you experience allergic reactions like skin rash, itching or hives, swelling of the face, lips, or tongue, tell your doctor or health care professional right away. In some cases, you may be given additional medicines to help with side effects. Follow all directions for their use. This drug may make you feel generally unwell. This is not uncommon, as chemotherapy can affect healthy cells as well as cancer cells. Report any side effects. Continue your course of treatment even though you feel ill unless your doctor tells you to stop. Call your doctor or health care professional for advice if you get a fever, chills or sore throat, or other symptoms of a cold or flu. Do not treat yourself. This drug decreases your body's ability to fight infections. Try to avoid being around people who are sick. This medicine may increase your risk to bruise   or bleed. Call your doctor or health care professional if you notice any unusual bleeding. Be careful brushing and flossing your teeth or using a toothpick because you may get an infection or bleed more easily.  If you have any dental work done, tell your dentist you are receiving this medicine. Avoid taking products that contain aspirin, acetaminophen, ibuprofen, naproxen, or ketoprofen unless instructed by your doctor. These medicines may hide a fever. Do not become pregnant while taking this medicine. Women should inform their doctor if they wish to become pregnant or think they might be pregnant. There is a potential for serious side effects to an unborn child. Talk to your health care professional or pharmacist for more information. Do not breast-feed an infant while taking this medicine. Men are advised not to father a child while receiving this medicine. This product may contain alcohol. Ask your pharmacist or healthcare provider if this medicine contains alcohol. Be sure to tell all healthcare providers you are taking this medicine. Certain medicines, like metronidazole and disulfiram, can cause an unpleasant reaction when taken with alcohol. The reaction includes flushing, headache, nausea, vomiting, sweating, and increased thirst. The reaction can last from 30 minutes to several hours. What side effects may I notice from receiving this medicine? Side effects that you should report to your doctor or health care professional as soon as possible:  allergic reactions like skin rash, itching or hives, swelling of the face, lips, or tongue  breathing problems  changes in vision  fast, irregular heartbeat  high or low blood pressure  mouth sores  pain, tingling, numbness in the hands or feet  signs of decreased platelets or bleeding - bruising, pinpoint red spots on the skin, black, tarry stools, blood in the urine  signs of decreased red blood cells - unusually weak or tired, feeling faint or lightheaded, falls  signs of infection - fever or chills, cough, sore throat, pain or difficulty passing urine  signs and symptoms of liver injury like dark yellow or brown urine; general ill feeling or  flu-like symptoms; light-colored stools; loss of appetite; nausea; right upper belly pain; unusually weak or tired; yellowing of the eyes or skin  swelling of the ankles, feet, hands  unusually slow heartbeat Side effects that usually do not require medical attention (report to your doctor or health care professional if they continue or are bothersome):  diarrhea  hair loss  loss of appetite  muscle or joint pain  nausea, vomiting  pain, redness, or irritation at site where injected  tiredness This list may not describe all possible side effects. Call your doctor for medical advice about side effects. You may report side effects to FDA at 1-800-FDA-1088. Where should I keep my medicine? This drug is given in a hospital or clinic and will not be stored at home. NOTE: This sheet is a summary. It may not cover all possible information. If you have questions about this medicine, talk to your doctor, pharmacist, or health care provider.  2020 Elsevier/Gold Standard (2017-01-30 13:14:55)  

## 2020-02-24 NOTE — Progress Notes (Signed)
Patient in chemotherapy education class with self.          .  Discussed side effects of Carboplatin and Taxol  which include but are not limited to myelosuppression, decreased appetite, fatigue, fever, allergic or infusional reaction, mucositis, cardiac toxicity, cough, SOB, altered taste, nausea and vomiting, diarrhea, constipation, elevated LFTs myalgia and arthralgias, hair loss or thinning, rash, skin dryness, nail changes, peripheral neuropathy, discolored urine, delayed wound healing, mental changes (Chemo brain), increased risk of infections, weight loss.  Reviewed infusion room and office policy and procedure and phone numbers 24 hours x 7 days a week.   Reviewed when to call the office with any concerns or problems.  Scientist, clinical (histocompatibility and immunogenetics) given.  Discussed portacath insertion and EMLA cream administration. Patient wanted to use ice packs on fingers and feet to prevent peripheral neuropathy.  [place] with dr Marin Olp.    Antiemetic protocol and chemotherapy schedule reviewed. Patient verbalized understanding of chemotherapy indications and possible side effects.  Teachback done

## 2020-02-24 NOTE — Progress Notes (Signed)
Brief visit with patient during first infusion for ovarian cancer. Patient follows a dairy free, gluten free, no sugar diet at the recommendation of her "integrative oncologists". She also participates in a 3 day fast, beginning the day before chemo, the day of chemo and the day after chemo. She has been educated by RN on chemotherapy and possible side effects. She feels strongly about following this dietary pattern and feels she has completed a lot of research to support these restrictions. Offered support and encouraged patient to contact me if I can be of any help during her treatment.

## 2020-02-25 ENCOUNTER — Other Ambulatory Visit (HOSPITAL_COMMUNITY): Payer: Self-pay | Admitting: Urology

## 2020-02-25 ENCOUNTER — Other Ambulatory Visit: Payer: Self-pay | Admitting: *Deleted

## 2020-02-25 ENCOUNTER — Other Ambulatory Visit: Payer: Self-pay | Admitting: Urology

## 2020-02-25 ENCOUNTER — Telehealth: Payer: Self-pay | Admitting: *Deleted

## 2020-02-25 ENCOUNTER — Encounter: Payer: Self-pay | Admitting: Hematology & Oncology

## 2020-02-25 DIAGNOSIS — N310 Uninhibited neuropathic bladder, not elsewhere classified: Secondary | ICD-10-CM

## 2020-02-25 DIAGNOSIS — C541 Malignant neoplasm of endometrium: Secondary | ICD-10-CM

## 2020-02-25 LAB — URINALYSIS, COMPLETE (UACMP) WITH MICROSCOPIC
Bilirubin Urine: NEGATIVE
Glucose, UA: NEGATIVE mg/dL
Hgb urine dipstick: NEGATIVE
Ketones, ur: 15 mg/dL — AB
Leukocytes,Ua: NEGATIVE
Nitrite: NEGATIVE
Protein, ur: NEGATIVE mg/dL
RBC / HPF: NONE SEEN RBC/hpf (ref 0–5)
Specific Gravity, Urine: <1.005 — ABNORMAL LOW (ref 1.005–1.030)
WBC, UA: NONE SEEN WBC/hpf (ref 0–5)
pH: 5.5 (ref 5.0–8.0)

## 2020-02-25 NOTE — Telephone Encounter (Signed)
Spoke with patient after her first treatment yesterday. She is doing well with no complaints at this time. She has all her prn medications at home. She has been taking her medications as prescribed.  Reviewed directions and use of her prn medications. Answered her questions to her satisfaction. She knows to contact the office with any questions or concerns.  She did ask that we fax her urine specimen results to [facility] when they are back, attention Dr [name].

## 2020-03-03 ENCOUNTER — Encounter: Payer: Self-pay | Admitting: Hematology & Oncology

## 2020-03-08 ENCOUNTER — Ambulatory Visit (HOSPITAL_COMMUNITY)
Admission: RE | Admit: 2020-03-08 | Discharge: 2020-03-08 | Disposition: A | Discharge status: Home / Self Care | Payer: 59 | Source: Ambulatory Visit | Attending: Urology | Admitting: Urology

## 2020-03-08 ENCOUNTER — Other Ambulatory Visit: Payer: Self-pay

## 2020-03-08 DIAGNOSIS — N310 Uninhibited neuropathic bladder, not elsewhere classified: Secondary | ICD-10-CM | POA: Insufficient documentation

## 2020-03-08 MED ORDER — FUROSEMIDE 10 MG/ML IJ SOLN
INTRAMUSCULAR | Status: AC
  Filled 2020-03-08: qty 4

## 2020-03-08 MED ORDER — FUROSEMIDE 10 MG/ML IJ SOLN
30.0000 mg | Freq: Once | INTRAMUSCULAR | Status: AC
  Administered 2020-03-08: 30 mg via INTRAVENOUS

## 2020-03-08 MED ORDER — TECHNETIUM TC 99M MERTIATIDE
5.3000 | Freq: Once | INTRAVENOUS | Status: AC | PRN
  Administered 2020-03-08: 5.3 via INTRAVENOUS

## 2020-03-09 ENCOUNTER — Other Ambulatory Visit: Payer: Self-pay | Admitting: Hematology & Oncology

## 2020-03-09 DIAGNOSIS — C541 Malignant neoplasm of endometrium: Secondary | ICD-10-CM

## 2020-03-12 ENCOUNTER — Other Ambulatory Visit: Payer: Self-pay | Admitting: *Deleted

## 2020-03-12 DIAGNOSIS — C541 Malignant neoplasm of endometrium: Secondary | ICD-10-CM

## 2020-03-12 MED ORDER — LORAZEPAM 0.5 MG PO TABS: 0.5000 mg | ORAL_TABLET | Freq: Four times a day (QID) | ORAL | 0 refills | Status: DC | PRN

## 2020-03-15 ENCOUNTER — Other Ambulatory Visit: Payer: Self-pay

## 2020-03-15 DIAGNOSIS — C541 Malignant neoplasm of endometrium: Secondary | ICD-10-CM

## 2020-03-16 ENCOUNTER — Inpatient Hospital Stay: Payer: 59

## 2020-03-16 ENCOUNTER — Encounter: Payer: Self-pay | Admitting: Hematology & Oncology

## 2020-03-16 ENCOUNTER — Telehealth: Payer: Self-pay | Admitting: Hematology & Oncology

## 2020-03-16 ENCOUNTER — Other Ambulatory Visit: Payer: Self-pay

## 2020-03-16 ENCOUNTER — Inpatient Hospital Stay: Payer: 59 | Attending: Hematology & Oncology | Admitting: Hematology & Oncology

## 2020-03-16 VITALS — BP 117/79 | HR 72 | Temp 97.9°F | Resp 18 | Wt 124.8 lb

## 2020-03-16 DIAGNOSIS — Z5111 Encounter for antineoplastic chemotherapy: Secondary | ICD-10-CM | POA: Insufficient documentation

## 2020-03-16 DIAGNOSIS — C541 Malignant neoplasm of endometrium: Secondary | ICD-10-CM | POA: Diagnosis not present

## 2020-03-16 DIAGNOSIS — C801 Malignant (primary) neoplasm, unspecified: Secondary | ICD-10-CM | POA: Diagnosis present

## 2020-03-16 LAB — CMP (CANCER CENTER ONLY)
ALT: 39 U/L (ref 0–44)
AST: 31 U/L (ref 15–41)
Albumin: 4.2 g/dL (ref 3.5–5.0)
Alkaline Phosphatase: 48 U/L (ref 38–126)
Anion gap: 12 (ref 5–15)
BUN: 14 mg/dL (ref 6–20)
CO2: 24 mmol/L (ref 22–32)
Calcium: 9.8 mg/dL (ref 8.9–10.3)
Chloride: 103 mmol/L (ref 98–111)
Creatinine: 0.67 mg/dL (ref 0.44–1.00)
GFR, Estimated: >60 mL/min (ref >60)
Glucose, Bld: 65 mg/dL — ABNORMAL LOW (ref 70–99)
Potassium: 3.6 mmol/L (ref 3.5–5.1)
Sodium: 139 mmol/L (ref 135–145)
Total Bilirubin: 0.5 mg/dL (ref 0.3–1.2)
Total Protein: 6.4 g/dL — ABNORMAL LOW (ref 6.5–8.1)

## 2020-03-16 LAB — CBC WITH DIFFERENTIAL (CANCER CENTER ONLY)
Abs Immature Granulocytes: 0.01 10*3/uL (ref 0.00–0.07)
Basophils Absolute: 0 10*3/uL (ref 0.0–0.1)
Basophils Relative: 1 %
Eosinophils Absolute: 0.1 10*3/uL (ref 0.0–0.5)
Eosinophils Relative: 2 %
HCT: 33.4 % — ABNORMAL LOW (ref 36.0–46.0)
Hemoglobin: 10.8 g/dL — ABNORMAL LOW (ref 12.0–15.0)
Immature Granulocytes: 0 %
Lymphocytes Relative: 31 %
Lymphs Abs: 1.1 10*3/uL (ref 0.7–4.0)
MCH: 29.4 pg (ref 26.0–34.0)
MCHC: 32.3 g/dL (ref 30.0–36.0)
MCV: 91 fL (ref 80.0–100.0)
Monocytes Absolute: 0.6 10*3/uL (ref 0.1–1.0)
Monocytes Relative: 17 %
Neutro Abs: 1.7 10*3/uL (ref 1.7–7.7)
Neutrophils Relative %: 49 %
Platelet Count: 283 10*3/uL (ref 150–400)
RBC: 3.67 MIL/uL — ABNORMAL LOW (ref 3.87–5.11)
RDW: 14.6 % (ref 11.5–15.5)
WBC Count: 3.5 10*3/uL — ABNORMAL LOW (ref 4.0–10.5)
nRBC: 0 % (ref 0.0–0.2)

## 2020-03-16 MED ORDER — SODIUM CHLORIDE 0.9 % IV SOLN
Freq: Once | INTRAVENOUS | Status: AC
  Filled 2020-03-16: qty 250

## 2020-03-16 MED ORDER — DIPHENHYDRAMINE HCL 50 MG/ML IJ SOLN
INTRAMUSCULAR | Status: AC
  Filled 2020-03-16: qty 1

## 2020-03-16 MED ORDER — DIPHENHYDRAMINE HCL 50 MG/ML IJ SOLN
12.5000 mg | Freq: Once | INTRAMUSCULAR | Status: AC
  Administered 2020-03-16: 12.5 mg via INTRAVENOUS

## 2020-03-16 MED ORDER — HEPARIN SOD (PORK) LOCK FLUSH 100 UNIT/ML IV SOLN
500.0000 [IU] | Freq: Once | INTRAVENOUS | Status: AC | PRN
  Administered 2020-03-16: 500 [IU]
  Filled 2020-03-16: qty 5

## 2020-03-16 MED ORDER — FAMOTIDINE IN NACL 20-0.9 MG/50ML-% IV SOLN
20.0000 mg | Freq: Once | INTRAVENOUS | Status: AC
  Administered 2020-03-16: 20 mg via INTRAVENOUS

## 2020-03-16 MED ORDER — SODIUM CHLORIDE 0.9% FLUSH
10.0000 mL | INTRAVENOUS | Status: DC | PRN
  Administered 2020-03-16: 10 mL
  Filled 2020-03-16: qty 10

## 2020-03-16 MED ORDER — SODIUM CHLORIDE 0.9 % IV SOLN
380.4000 mg | Freq: Once | INTRAVENOUS | Status: AC
  Administered 2020-03-16: 380 mg via INTRAVENOUS
  Filled 2020-03-16: qty 38

## 2020-03-16 MED ORDER — SODIUM CHLORIDE 0.9 % IV SOLN
10.0000 mg | Freq: Once | INTRAVENOUS | Status: AC
  Administered 2020-03-16: 10 mg via INTRAVENOUS
  Filled 2020-03-16: qty 1

## 2020-03-16 MED ORDER — SODIUM CHLORIDE 0.9 % IV SOLN
16.0000 mg | Freq: Once | INTRAVENOUS | Status: AC
  Administered 2020-03-16: 16 mg via INTRAVENOUS
  Filled 2020-03-16: qty 8

## 2020-03-16 MED ORDER — FAMOTIDINE IN NACL 20-0.9 MG/50ML-% IV SOLN
INTRAVENOUS | Status: AC
  Filled 2020-03-16: qty 50

## 2020-03-16 MED ORDER — SODIUM CHLORIDE 0.9 % IV SOLN
140.0000 mg/m2 | Freq: Once | INTRAVENOUS | Status: AC
  Administered 2020-03-16: 234 mg via INTRAVENOUS
  Filled 2020-03-16: qty 39

## 2020-03-16 MED ORDER — SODIUM CHLORIDE 0.9 % IV SOLN
150.0000 mg | Freq: Once | INTRAVENOUS | Status: AC
  Administered 2020-03-16: 150 mg via INTRAVENOUS
  Filled 2020-03-16: qty 5

## 2020-03-16 NOTE — Progress Notes (Signed)
Hematology and Oncology Follow Up Visit  Sherry Williams [id number] Feb 14, 1961 59 y.o. 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  She is doing quite well.  She tolerated her first cycle of chemotherapy nicely.  She had the Benadryl, even half the dose, still made her quite tired.  We will decrease the dose down to 12.5 mg.  She did see urology.  Thankfully, she will not need any surgical intervention for the hydronephrosis.  Her renal function is doing great.  She has had no problems with urine outflow.  She does have a little bit of lymphedema in her lower legs.  This is more prominent in the right leg.  She has had no problems with cough.  There is no fever.  She has had no mouth sores.  She has noted that the lymph nodes in the left neck are decreasing in size.  She has had no issues with her bowels.  She has had no diarrhea.  She did have little of a viral syndrome recently and has some diarrhea with this.  Overall, I would say her performance status is ECOG 1.      Medications:  Current Outpatient Medications:  .  ASPIRIN 81 PO, Take by mouth daily., Disp: , Rfl:  .  atorvastatin (LIPITOR) 40 MG tablet, , Disp: , Rfl:  .  dexamethasone (DECADRON) 4 MG tablet, Take 2 tablets (8 mg total) by mouth daily. Start the day after carboplatin chemotherapy for 3 days., Disp: 30 tablet, Rfl: 1 .  fondaparinux (ARIXTRA) 7.5 MG/0.6ML SOLN injection, ADMINISTER 0.6 ML(7.5 MG) UNDER THE SKIN DAILY, Disp: 18 mL, Rfl: 6 .  lidocaine-prilocaine (EMLA) cream, Apply to affected area once, Disp: 30 g, Rfl: 3 .  ondansetron (ZOFRAN) 8 MG tablet, Take 1 tablet (8 mg total) by  mouth 2 (two) times daily as needed for refractory nausea / vomiting. Start on day 3 after carboplatin chemo., Disp: 30 tablet, Rfl: 1 .  thyroid (ARMOUR THYROID) 60 MG tablet, TAKE 1 TABLET BY MOUTH EVERY DAY BEFORE BREAKFAST. TAKE 1 DAILY 5 DAYS PER WEEK, Disp: 90 tablet, Rfl: 2 .  thyroid (ARMOUR THYROID) 90 MG tablet, TAKE 1 TABLET BY MOUTH EVERY DAY TWO DAYS PER WEEK, Disp: 90 tablet, Rfl: 1 .  LORazepam (ATIVAN) 0.5 MG tablet, Take 1 tablet (0.5 mg total) by mouth every 6 (six) hours as needed (Nausea or vomiting). (Patient not taking: Reported on 03/16/2020), Disp: 30 tablet, Rfl: 0 .  prochlorperazine (COMPAZINE) 10 MG tablet, TAKE 1 TABLET(10 MG) BY MOUTH EVERY 6 HOURS AS NEEDED FOR NAUSEA OR VOMITING (Patient not taking: Reported on 03/16/2020), Disp: 30 tablet, Rfl: 1 .  rosuvastatin (CRESTOR) 20 MG tablet, Take 20 mg by mouth daily., Disp: , Rfl:  .  rosuvastatin (CRESTOR) 40 MG tablet, Take 40 mg by mouth daily. Take 1/2 tablet, total of 20 mg by mouth daily., Disp: , Rfl:   Allergies:  Allergies  Allergen Reactions  . Bee Venom Anaphylaxis and Swelling  . Adhesive [Tape] Other (See Comments)    Irritation and red  . Aspartame Diarrhea  . Aspartame And Phenylalanine Diarrhea    Past Medical  History, Surgical history, Social history, and Family History were reviewed and updated.  Review of Systems: Review of Systems  Constitutional: Negative.   HENT: Negative.   Eyes: Negative.   Respiratory: Negative.   Cardiovascular: Negative.   Gastrointestinal: Positive for abdominal pain.  Genitourinary: Negative.   Musculoskeletal: Negative.   Skin: Negative.   Neurological: Negative.   Endo/Heme/Allergies: Negative.   Psychiatric/Behavioral: Negative.     Physical Exam:  weight is 124 lb 12 oz (56.6 kg). Her oral temperature is 97.9 F (36.6 C). Her blood pressure is 117/79 and her pulse is 72. Her respiration is 18 and oxygen saturation is 100%.   Wt Readings from Last 3  Encounters:  03/16/20 124 lb 12 oz (56.6 kg)  01/28/20 131 lb (59.4 kg)  01/19/20 131 lb (59.4 kg)    Physical Exam Vitals reviewed.  HENT:     Head: Normocephalic and atraumatic.  Eyes:     Pupils: Pupils are equal, round, and reactive to light.  Cardiovascular:     Rate and Rhythm: Normal rate and regular rhythm.     Heart sounds: Normal heart sounds.  Pulmonary:     Effort: Pulmonary effort is normal.     Breath sounds: Normal breath sounds.  Abdominal:     General: Bowel sounds are normal.     Palpations: Abdomen is soft.     Comments: Abdominal exam shows a soft abdomen.  She has well healing laparoscopic scars.  I think there are 5 laparoscopic scars.  She has no erythema or exudate or warmth associated with these.  She has no fluid wave in the abdomen.  There is no guarding or rebound tenderness.  She has no palpable liver or spleen tip.  Musculoskeletal:        General: No tenderness or deformity. Normal range of motion.     Cervical back: Normal range of motion.  Lymphadenopathy:     Cervical: No cervical adenopathy.  Skin:    General: Skin is warm and dry.     Findings: No erythema or rash.  Neurological:     Mental Status: She is alert and oriented to person, place, and time.  Psychiatric:        Behavior: Behavior normal.        Thought Content: Thought content normal.        Judgment: Judgment normal.      Lab Results  Component Value Date   WBC 3.5 (L) 03/16/2020   HGB 10.8 (L) 03/16/2020   HCT 33.4 (L) 03/16/2020   MCV 91.0 03/16/2020   PLT 283 03/16/2020     Chemistry      Component Value Date/Time   NA 139 03/16/2020 0807   NA 142 02/15/2017 1518   NA 140 04/13/2016 1055   K 3.6 03/16/2020 0807   K 4.1 02/15/2017 1518   K 4.1 04/13/2016 1055   CL 103 03/16/2020 0807   CL 107 02/15/2017 1518   CO2 24 03/16/2020 0807   CO2 29 02/15/2017 1518   CO2 27 04/13/2016 1055   BUN 14 03/16/2020 0807   BUN 17 02/15/2017 1518   BUN 22.1 04/13/2016  1055   CREATININE 0.67 03/16/2020 0807   CREATININE 1.0 02/15/2017 1518   CREATININE 0.8 04/13/2016 1055      Component Value Date/Time   CALCIUM 9.8 03/16/2020 0807   CALCIUM 9.3 02/15/2017 1518   CALCIUM 9.9 04/13/2016 1055   ALKPHOS 48 03/16/2020 0807   ALKPHOS 79 02/15/2017 1518  ALKPHOS 81 04/13/2016 1055   AST 31 03/16/2020 0807   AST 20 04/13/2016 1055   ALT 39 03/16/2020 0807   ALT 24 02/15/2017 1518   ALT 22 04/13/2016 1055   BILITOT 0.5 03/16/2020 0807   BILITOT 0.44 04/13/2016 1055      Impression and Plan: Ms. Schwartzkopf is a 59 year old Caucasian female.  She has had issues with recurrence of thromboembolic events.  She has had these systemically and in the brain.  I still think that the "key" to this tumor is the fact that it is BRCA2 positive.  I know she had the genetic test for BRCA and was negative.  As such this is a somatic mutation that is in the cancer.  I reassured her that this does not really put her at high risk for breast cancer.  We will go ahead with her second cycle of chemotherapy.  The dose will not change.  She really has done nicely.  I will probably plan for CT after the third cycle of treatment.  I think the CA 125 will also give [place] a good idea as to how well things are going.  We will plan for follow-up in 3 weeks.    [name], MD 10/5/20219:26 AM

## 2020-03-16 NOTE — Telephone Encounter (Signed)
Appointments scheduled calendar printed per 10/5 mlos

## 2020-03-16 NOTE — Patient Instructions (Signed)
[facility] Discharge Instructions for Patients Receiving Chemotherapy  Today you received the following chemotherapy agents Taxol, Carboplatin.   To help prevent nausea and vomiting after your treatment, we encourage you to take your nausea medication    If you develop nausea and vomiting that is not controlled by your nausea medication, call the clinic.   BELOW ARE SYMPTOMS THAT SHOULD BE REPORTED IMMEDIATELY:  *FEVER GREATER THAN 100.5 F  *CHILLS WITH OR WITHOUT FEVER  NAUSEA AND VOMITING THAT IS NOT CONTROLLED WITH YOUR NAUSEA MEDICATION  *UNUSUAL SHORTNESS OF BREATH  *UNUSUAL BRUISING OR BLEEDING  TENDERNESS IN MOUTH AND THROAT WITH OR WITHOUT PRESENCE OF ULCERS  *URINARY PROBLEMS  *BOWEL PROBLEMS  UNUSUAL RASH Items with * indicate a potential emergency and should be followed up as soon as possible.  Feel free to call the clinic should you have any questions or concerns. The clinic phone number is [phone].  Please show the CHEMO ALERT CARD at check-in to the Emergency Department and triage nurse.   

## 2020-03-17 ENCOUNTER — Encounter: Payer: Self-pay | Admitting: *Deleted

## 2020-03-17 LAB — CA 125: Cancer Antigen (CA) 125: 151 U/mL — ABNORMAL HIGH (ref 0.0–38.1)

## 2020-03-24 ENCOUNTER — Telehealth: Payer: Self-pay | Admitting: Hematology & Oncology

## 2020-03-24 NOTE — Telephone Encounter (Signed)
Faxed medical records to: [id number] [place] [facility] [place] [place]: [phone]   Sherry Williams [birth year] CASE: [id number]     COPY SCANNED

## 2020-03-30 ENCOUNTER — Telehealth: Payer: Self-pay | Admitting: Hematology & Oncology

## 2020-03-30 NOTE — Telephone Encounter (Signed)
Faxed medical records to: [facility] F: [phone] P: [phone]    Sherry Williams [birth year]     COPY SCANNED

## 2020-04-08 ENCOUNTER — Ambulatory Visit: Payer: 59 | Admitting: Hematology & Oncology

## 2020-04-08 ENCOUNTER — Other Ambulatory Visit: Payer: 59

## 2020-04-08 ENCOUNTER — Ambulatory Visit: Payer: 59

## 2020-04-12 ENCOUNTER — Other Ambulatory Visit: Payer: Self-pay

## 2020-04-12 ENCOUNTER — Encounter: Payer: Self-pay | Admitting: Hematology & Oncology

## 2020-04-12 ENCOUNTER — Inpatient Hospital Stay: Payer: 59

## 2020-04-12 ENCOUNTER — Inpatient Hospital Stay: Payer: 59 | Attending: Hematology & Oncology

## 2020-04-12 ENCOUNTER — Inpatient Hospital Stay (HOSPITAL_BASED_OUTPATIENT_CLINIC_OR_DEPARTMENT_OTHER): Payer: 59 | Admitting: Hematology & Oncology

## 2020-04-12 VITALS — BP 108/60 | HR 71 | Temp 98.5°F | Resp 18 | Wt 124.0 lb

## 2020-04-12 DIAGNOSIS — Z79899 Other long term (current) drug therapy: Secondary | ICD-10-CM | POA: Diagnosis not present

## 2020-04-12 DIAGNOSIS — Z5111 Encounter for antineoplastic chemotherapy: Secondary | ICD-10-CM | POA: Insufficient documentation

## 2020-04-12 DIAGNOSIS — C541 Malignant neoplasm of endometrium: Secondary | ICD-10-CM | POA: Diagnosis not present

## 2020-04-12 DIAGNOSIS — C801 Malignant (primary) neoplasm, unspecified: Secondary | ICD-10-CM | POA: Insufficient documentation

## 2020-04-12 LAB — CBC WITH DIFFERENTIAL (CANCER CENTER ONLY)
Abs Immature Granulocytes: 0.01 10*3/uL (ref 0.00–0.07)
Basophils Absolute: 0 10*3/uL (ref 0.0–0.1)
Basophils Relative: 1 %
Eosinophils Absolute: 0 10*3/uL (ref 0.0–0.5)
Eosinophils Relative: 1 %
HCT: 31.9 % — ABNORMAL LOW (ref 36.0–46.0)
Hemoglobin: 10.2 g/dL — ABNORMAL LOW (ref 12.0–15.0)
Immature Granulocytes: 0 %
Lymphocytes Relative: 21 %
Lymphs Abs: 0.9 10*3/uL (ref 0.7–4.0)
MCH: 30 pg (ref 26.0–34.0)
MCHC: 32 g/dL (ref 30.0–36.0)
MCV: 93.8 fL (ref 80.0–100.0)
Monocytes Absolute: 0.4 10*3/uL (ref 0.1–1.0)
Monocytes Relative: 10 %
Neutro Abs: 2.9 10*3/uL (ref 1.7–7.7)
Neutrophils Relative %: 67 %
Platelet Count: 223 10*3/uL (ref 150–400)
RBC: 3.4 MIL/uL — ABNORMAL LOW (ref 3.87–5.11)
RDW: 15.9 % — ABNORMAL HIGH (ref 11.5–15.5)
WBC Count: 4.3 10*3/uL (ref 4.0–10.5)
nRBC: 0 % (ref 0.0–0.2)

## 2020-04-12 LAB — CMP (CANCER CENTER ONLY)
ALT: 16 U/L (ref 0–44)
AST: 19 U/L (ref 15–41)
Albumin: 4.2 g/dL (ref 3.5–5.0)
Alkaline Phosphatase: 56 U/L (ref 38–126)
Anion gap: 11 (ref 5–15)
BUN: 15 mg/dL (ref 6–20)
CO2: 26 mmol/L (ref 22–32)
Calcium: 9.7 mg/dL (ref 8.9–10.3)
Chloride: 100 mmol/L (ref 98–111)
Creatinine: 0.71 mg/dL (ref 0.44–1.00)
GFR, Estimated: >60 mL/min (ref >60)
Glucose, Bld: 75 mg/dL (ref 70–99)
Potassium: 3.8 mmol/L (ref 3.5–5.1)
Sodium: 137 mmol/L (ref 135–145)
Total Bilirubin: 0.4 mg/dL (ref 0.3–1.2)
Total Protein: 6.4 g/dL — ABNORMAL LOW (ref 6.5–8.1)

## 2020-04-12 LAB — LACTATE DEHYDROGENASE: LDH: 174 U/L (ref 98–192)

## 2020-04-12 MED ORDER — FAMOTIDINE IN NACL 20-0.9 MG/50ML-% IV SOLN
20.0000 mg | Freq: Once | INTRAVENOUS | Status: AC
  Administered 2020-04-12: 20 mg via INTRAVENOUS

## 2020-04-12 MED ORDER — SODIUM CHLORIDE 0.9% FLUSH
10.0000 mL | INTRAVENOUS | Status: DC | PRN
  Administered 2020-04-12: 10 mL
  Filled 2020-04-12: qty 10

## 2020-04-12 MED ORDER — SODIUM CHLORIDE 0.9 % IV SOLN
140.0000 mg/m2 | Freq: Once | INTRAVENOUS | Status: AC
  Administered 2020-04-12: 234 mg via INTRAVENOUS
  Filled 2020-04-12: qty 39

## 2020-04-12 MED ORDER — SODIUM CHLORIDE 0.9 % IV SOLN
10.0000 mg | Freq: Once | INTRAVENOUS | Status: AC
  Administered 2020-04-12: 10 mg via INTRAVENOUS
  Filled 2020-04-12: qty 10

## 2020-04-12 MED ORDER — HEPARIN SOD (PORK) LOCK FLUSH 100 UNIT/ML IV SOLN
500.0000 [IU] | Freq: Once | INTRAVENOUS | Status: AC | PRN
  Administered 2020-04-12: 500 [IU]
  Filled 2020-04-12: qty 5

## 2020-04-12 MED ORDER — DIPHENHYDRAMINE HCL 50 MG/ML IJ SOLN
INTRAMUSCULAR | Status: AC
  Filled 2020-04-12: qty 1

## 2020-04-12 MED ORDER — SODIUM CHLORIDE 0.9 % IV SOLN
380.0000 mg | Freq: Once | INTRAVENOUS | Status: AC
  Administered 2020-04-12: 380 mg via INTRAVENOUS
  Filled 2020-04-12: qty 38

## 2020-04-12 MED ORDER — DIPHENHYDRAMINE HCL 50 MG/ML IJ SOLN
12.5000 mg | Freq: Once | INTRAMUSCULAR | Status: AC
  Administered 2020-04-12: 12.5 mg via INTRAVENOUS

## 2020-04-12 MED ORDER — SODIUM CHLORIDE 0.9 % IV SOLN
150.0000 mg | Freq: Once | INTRAVENOUS | Status: AC
  Administered 2020-04-12: 150 mg via INTRAVENOUS
  Filled 2020-04-12: qty 150

## 2020-04-12 MED ORDER — SODIUM CHLORIDE 0.9 % IV SOLN
16.0000 mg | Freq: Once | INTRAVENOUS | Status: AC
  Administered 2020-04-12: 16 mg via INTRAVENOUS
  Filled 2020-04-12: qty 8

## 2020-04-12 MED ORDER — FAMOTIDINE IN NACL 20-0.9 MG/50ML-% IV SOLN
INTRAVENOUS | Status: AC
  Filled 2020-04-12: qty 50

## 2020-04-12 MED ORDER — SODIUM CHLORIDE 0.9 % IV SOLN
Freq: Once | INTRAVENOUS | Status: AC
  Filled 2020-04-12: qty 250

## 2020-04-12 NOTE — Progress Notes (Signed)
Patient stable upon discharge.  

## 2020-04-12 NOTE — Progress Notes (Signed)
Hematology and Oncology Follow Up Visit  Sherry Williams [id number] [birth year] 59 y.o. 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  She comes in with her husband.  She and her husband just got back from [place].  Her brother got married up there.  They had a very nice time.  She showed me pictures.  I am just so happy that she can go up there to be at his wedding.  I know it was important for her and for the family to be there.  So far, she is done quite well with treatment.  Her last CA-125 came down to 155.  I am just very happy about this.  We will see what today's level is.  She is still walking.  She is doing a lot of walking.  She probably walks about 3 or 4 miles a day.  She has had no problems with cough.  She has had no shortness of breath.  She may get tired little more easily.  Her appetite is doing all right.  Her weight is holding stable which is encouraging.  There is been no obvious change in bowel or bladder habits.  She has had no leg swelling.  She has had no rashes.  She has had no bleeding.  I must say that she had a very nice cranial prosthesis on today.  The wig looks very natural.  She is doing well on the Arixtra.  So far, there have been no problems with recurrent thromboembolism.  We will plan for a follow-up scan after this cycle of treatment.  I would have to believe that everything is going to look okay on the scan given that the CA-125 has come down.  Currently, her performance status is ECOG 1.      Medications:  Current Outpatient Medications:  .  ASPIRIN 81 PO, Take by mouth daily., Disp: , Rfl:  .  atorvastatin  (LIPITOR) 40 MG tablet, , Disp: , Rfl:  .  dexamethasone (DECADRON) 4 MG tablet, Take 2 tablets (8 mg total) by mouth daily. Start the day after carboplatin chemotherapy for 3 days., Disp: 30 tablet, Rfl: 1 .  fondaparinux (ARIXTRA) 7.5 MG/0.6ML SOLN injection, ADMINISTER 0.6 ML(7.5 MG) UNDER THE SKIN DAILY, Disp: 18 mL, Rfl: 6 .  lidocaine-prilocaine (EMLA) cream, Apply to affected area once, Disp: 30 g, Rfl: 3 .  LORazepam (ATIVAN) 0.5 MG tablet, Take 1 tablet (0.5 mg total) by mouth every 6 (six) hours as needed (Nausea or vomiting). (Patient not taking: Reported on 03/16/2020), Disp: 30 tablet, Rfl: 0 .  ondansetron (ZOFRAN) 8 MG tablet, Take 1 tablet (8 mg total) by mouth 2 (two) times daily as needed for refractory nausea / vomiting. Start on day 3 after carboplatin chemo., Disp: 30 tablet, Rfl: 1 .  prochlorperazine (COMPAZINE) 10 MG tablet, TAKE 1 TABLET(10 MG) BY MOUTH EVERY 6 HOURS AS NEEDED FOR NAUSEA OR VOMITING (Patient not taking: Reported on 03/16/2020), Disp: 30 tablet, Rfl: 1 .  thyroid (ARMOUR THYROID) 60 MG tablet, TAKE 1 TABLET BY MOUTH EVERY DAY BEFORE BREAKFAST. TAKE 1 DAILY 5 DAYS PER WEEK, Disp: 90 tablet, Rfl: 2 .  thyroid (ARMOUR THYROID) 90 MG tablet, TAKE 1 TABLET BY MOUTH EVERY DAY TWO DAYS PER WEEK, Disp: 90 tablet, Rfl: 1  Allergies:  Allergies  Allergen Reactions  . Bee Venom Anaphylaxis and Swelling  . Adhesive [Tape] Other (See Comments)    Irritation and red  . Aspartame Diarrhea  . Aspartame And Phenylalanine Diarrhea    Past Medical History, Surgical history, Social history, and Family History were reviewed and updated.  Review of Systems: Review of Systems  Constitutional: Negative.   HENT: Negative.   Eyes: Negative.   Respiratory: Negative.   Cardiovascular: Negative.   Gastrointestinal: Positive for abdominal pain.  Genitourinary: Negative.   Musculoskeletal: Negative.   Skin: Negative.   Neurological: Negative.   Endo/Heme/Allergies: Negative.    Psychiatric/Behavioral: Negative.     Physical Exam:  weight is 124 lb (56.2 kg). Her oral temperature is 98.5 F (36.9 C). Her blood pressure is 108/60 and her pulse is 71. Her respiration is 18 and oxygen saturation is 100%.   Wt Readings from Last 3 Encounters:  04/12/20 124 lb (56.2 kg)  03/16/20 124 lb 12 oz (56.6 kg)  01/28/20 131 lb (59.4 kg)    Physical Exam Vitals reviewed.  HENT:     Head: Normocephalic and atraumatic.  Eyes:     Pupils: Pupils are equal, round, and reactive to light.  Cardiovascular:     Rate and Rhythm: Normal rate and regular rhythm.     Heart sounds: Normal heart sounds.  Pulmonary:     Effort: Pulmonary effort is normal.     Breath sounds: Normal breath sounds.  Abdominal:     General: Bowel sounds are normal.     Palpations: Abdomen is soft.     Comments: Abdominal exam shows a soft abdomen.  She has well healing laparoscopic scars.  I think there are 5 laparoscopic scars.  She has no erythema or exudate or warmth associated with these.  She has no fluid wave in the abdomen.  There is no guarding or rebound tenderness.  She has no palpable liver or spleen tip.  Musculoskeletal:        General: No tenderness or deformity. Normal range of motion.     Cervical back: Normal range of motion.  Lymphadenopathy:     Cervical: No cervical adenopathy.  Skin:    General: Skin is warm and dry.     Findings: No erythema or rash.  Neurological:     Mental Status: She is alert and oriented to person, place, and time.  Psychiatric:        Behavior: Behavior normal.        Thought Content: Thought content normal.        Judgment: Judgment normal.      Lab Results  Component Value Date   WBC 4.3 04/12/2020   HGB 10.2 (L) 04/12/2020   HCT 31.9 (L) 04/12/2020   MCV 93.8 04/12/2020   PLT 223 04/12/2020     Chemistry      Component Value Date/Time   NA 139 03/16/2020 0807   NA 142 02/15/2017 1518   NA 140 04/13/2016 1055   K 3.6 03/16/2020  0807   K 4.1 02/15/2017 1518   K 4.1 04/13/2016 1055   CL 103 03/16/2020 0807   CL 107 02/15/2017 1518   CO2 24 03/16/2020 0807   CO2 29 02/15/2017 1518   CO2 27 04/13/2016 1055   BUN 14 03/16/2020 0807   BUN 17 02/15/2017 1518  BUN 22.1 04/13/2016 1055   CREATININE 0.67 03/16/2020 0807   CREATININE 1.0 02/15/2017 1518   CREATININE 0.8 04/13/2016 1055      Component Value Date/Time   CALCIUM 9.8 03/16/2020 0807   CALCIUM 9.3 02/15/2017 1518   CALCIUM 9.9 04/13/2016 1055   ALKPHOS 48 03/16/2020 0807   ALKPHOS 79 02/15/2017 1518   ALKPHOS 81 04/13/2016 1055   AST 31 03/16/2020 0807   AST 20 04/13/2016 1055   ALT 39 03/16/2020 0807   ALT 24 02/15/2017 1518   ALT 22 04/13/2016 1055   BILITOT 0.5 03/16/2020 0807   BILITOT 0.44 04/13/2016 1055      Impression and Plan: Ms. Fredenburg is a 59 year old Caucasian female.  She has had issues with recurrence of thromboembolic events.  She has had these systemically and in the brain.  I still think that the "key" to this tumor is the fact that it is BRCA2 positive.  I know she had the genetic test for BRCA and was negative.  As such this is a somatic mutation that is in the cancer.  I reassured her that this does not really put her at high risk for breast cancer.  We will proceed with her third cycle of treatment.  Again, after this cycle, we will follow up with a scan.  I will have to see whether we do a CT scan or a PET scan on her.  I still think that she will need 6 cycles of treatment.  I still believe that she would benefit from olaparib after chemotherapy given that the tumor does have the BRCA2 mutation.    [name], MD 11/1/20218:54 AM

## 2020-04-12 NOTE — Patient Instructions (Signed)

## 2020-04-12 NOTE — Patient Instructions (Addendum)
[facility] Discharge Instructions for Patients Receiving Chemotherapy  Today you received the following chemotherapy agents Taxol and Carboplatin  To help prevent nausea and vomiting after your treatment, we encourage you to take your nausea medication as prescribed by MD.   If you develop nausea and vomiting that is not controlled by your nausea medication, call the clinic.   BELOW ARE SYMPTOMS THAT SHOULD BE REPORTED IMMEDIATELY:  *FEVER GREATER THAN 100.5 F  *CHILLS WITH OR WITHOUT FEVER  NAUSEA AND VOMITING THAT IS NOT CONTROLLED WITH YOUR NAUSEA MEDICATION  *UNUSUAL SHORTNESS OF BREATH  *UNUSUAL BRUISING OR BLEEDING  TENDERNESS IN MOUTH AND THROAT WITH OR WITHOUT PRESENCE OF ULCERS  *URINARY PROBLEMS  *BOWEL PROBLEMS  UNUSUAL RASH Items with * indicate a potential emergency and should be followed up as soon as possible.  Feel free to call the clinic should you have any questions or concerns. The clinic phone number is [phone].  Please show the [facility] at check-in to the Emergency Department and triage nurse.  Carboplatin injection What is this medicine? CARBOPLATIN (KAR boe pla tin) is a chemotherapy drug. It targets fast dividing cells, like cancer cells, and causes these cells to die. This medicine is used to treat ovarian cancer and many other cancers. This medicine may be used for other purposes; ask your health care provider or pharmacist if you have questions. COMMON BRAND NAME(S): Paraplatin What should I tell my health care provider before I take this medicine? They need to know if you have any of these conditions: blood disorders hearing problems kidney disease recent or ongoing radiation therapy an unusual or allergic reaction to carboplatin, cisplatin, other chemotherapy, other medicines, foods, dyes, or preservatives pregnant or trying to get pregnant breast-feeding How should I use this medicine? This drug is usually  given as an infusion into a vein. It is administered in a hospital or clinic by a specially trained health care professional. Talk to your pediatrician regarding the use of this medicine in children. Special care may be needed. Overdosage: If you think you have taken too much of this medicine contact a poison control center or emergency room at once. NOTE: This medicine is only for you. Do not share this medicine with others. What if I miss a dose? It is important not to miss a dose. Call your doctor or health care professional if you are unable to keep an appointment. What may interact with this medicine? medicines for seizures medicines to increase blood counts like filgrastim, pegfilgrastim, sargramostim some antibiotics like amikacin, gentamicin, neomycin, streptomycin, tobramycin vaccines Talk to your doctor or health care professional before taking any of these medicines: acetaminophen aspirin ibuprofen ketoprofen naproxen This list may not describe all possible interactions. Give your health care provider a list of all the medicines, herbs, non-prescription drugs, or dietary supplements you use. Also tell them if you smoke, drink alcohol, or use illegal drugs. Some items may interact with your medicine. What should I watch for while using this medicine? Your condition will be monitored carefully while you are receiving this medicine. You will need important blood work done while you are taking this medicine. This drug may make you feel generally unwell. This is not uncommon, as chemotherapy can affect healthy cells as well as cancer cells. Report any side effects. Continue your course of treatment even though you feel ill unless your doctor tells you to stop. In some cases, you may be given additional medicines to help with  side effects. Follow all directions for their use. Call your doctor or health care professional for advice if you get a fever, chills or sore throat, or other  symptoms of a cold or flu. Do not treat yourself. This drug decreases your body's ability to fight infections. Try to avoid being around people who are sick. This medicine may increase your risk to bruise or bleed. Call your doctor or health care professional if you notice any unusual bleeding. Be careful brushing and flossing your teeth or using a toothpick because you may get an infection or bleed more easily. If you have any dental work done, tell your dentist you are receiving this medicine. Avoid taking products that contain aspirin, acetaminophen, ibuprofen, naproxen, or ketoprofen unless instructed by your doctor. These medicines may hide a fever. Do not become pregnant while taking this medicine. Women should inform their doctor if they wish to become pregnant or think they might be pregnant. There is a potential for serious side effects to an unborn child. Talk to your health care professional or pharmacist for more information. Do not breast-feed an infant while taking this medicine. What side effects may I notice from receiving this medicine? Side effects that you should report to your doctor or health care professional as soon as possible: allergic reactions like skin rash, itching or hives, swelling of the face, lips, or tongue signs of infection - fever or chills, cough, sore throat, pain or difficulty passing urine signs of decreased platelets or bleeding - bruising, pinpoint red spots on the skin, black, tarry stools, nosebleeds signs of decreased red blood cells - unusually weak or tired, fainting spells, lightheadedness breathing problems changes in hearing changes in vision chest pain high blood pressure low blood counts - This drug may decrease the number of white blood cells, red blood cells and platelets. You may be at increased risk for infections and bleeding. nausea and vomiting pain, swelling, redness or irritation at the injection site pain, tingling, numbness in the  hands or feet problems with balance, talking, walking trouble passing urine or change in the amount of urine Side effects that usually do not require medical attention (report to your doctor or health care professional if they continue or are bothersome): hair loss loss of appetite metallic taste in the mouth or changes in taste This list may not describe all possible side effects. Call your doctor for medical advice about side effects. You may report side effects to FDA at 1-800-FDA-1088. Where should I keep my medicine? This drug is given in a hospital or clinic and will not be stored at home. NOTE: This sheet is a summary. It may not cover all possible information. If you have questions about this medicine, talk to your doctor, pharmacist, or health care provider.  2020 Elsevier/Gold Standard (2007-09-03 14:38:05) Paclitaxel injection What is this medicine? PACLITAXEL (PAK li TAX el) is a chemotherapy drug. It targets fast dividing cells, like cancer cells, and causes these cells to die. This medicine is used to treat ovarian cancer, breast cancer, lung cancer, Kaposi's sarcoma, and other cancers. This medicine may be used for other purposes; ask your health care provider or pharmacist if you have questions. COMMON BRAND NAME(S): Onxol, Taxol What should I tell my health care provider before I take this medicine? They need to know if you have any of these conditions: history of irregular heartbeat liver disease low blood counts, like low white cell, platelet, or red cell counts lung or breathing disease, like  asthma tingling of the fingers or toes, or other nerve disorder an unusual or allergic reaction to paclitaxel, alcohol, polyoxyethylated castor oil, other chemotherapy, other medicines, foods, dyes, or preservatives pregnant or trying to get pregnant breast-feeding How should I use this medicine? This drug is given as an infusion into a vein. It is administered in a hospital or  clinic by a specially trained health care professional. Talk to your pediatrician regarding the use of this medicine in children. Special care may be needed. Overdosage: If you think you have taken too much of this medicine contact a poison control center or emergency room at once. NOTE: This medicine is only for you. Do not share this medicine with others. What if I miss a dose? It is important not to miss your dose. Call your doctor or health care professional if you are unable to keep an appointment. What may interact with this medicine? Do not take this medicine with any of the following medications: disulfiram metronidazole This medicine may also interact with the following medications: antiviral medicines for hepatitis, HIV or AIDS certain antibiotics like erythromycin and clarithromycin certain medicines for fungal infections like ketoconazole and itraconazole certain medicines for seizures like carbamazepine, phenobarbital, phenytoin gemfibrozil nefazodone rifampin St. John's wort This list may not describe all possible interactions. Give your health care provider a list of all the medicines, herbs, non-prescription drugs, or dietary supplements you use. Also tell them if you smoke, drink alcohol, or use illegal drugs. Some items may interact with your medicine. What should I watch for while using this medicine? Your condition will be monitored carefully while you are receiving this medicine. You will need important blood work done while you are taking this medicine. This medicine can cause serious allergic reactions. To reduce your risk you will need to take other medicine(s) before treatment with this medicine. If you experience allergic reactions like skin rash, itching or hives, swelling of the face, lips, or tongue, tell your doctor or health care professional right away. In some cases, you may be given additional medicines to help with side effects. Follow all directions for  their use. This drug may make you feel generally unwell. This is not uncommon, as chemotherapy can affect healthy cells as well as cancer cells. Report any side effects. Continue your course of treatment even though you feel ill unless your doctor tells you to stop. Call your doctor or health care professional for advice if you get a fever, chills or sore throat, or other symptoms of a cold or flu. Do not treat yourself. This drug decreases your body's ability to fight infections. Try to avoid being around people who are sick. This medicine may increase your risk to bruise or bleed. Call your doctor or health care professional if you notice any unusual bleeding. Be careful brushing and flossing your teeth or using a toothpick because you may get an infection or bleed more easily. If you have any dental work done, tell your dentist you are receiving this medicine. Avoid taking products that contain aspirin, acetaminophen, ibuprofen, naproxen, or ketoprofen unless instructed by your doctor. These medicines may hide a fever. Do not become pregnant while taking this medicine. Women should inform their doctor if they wish to become pregnant or think they might be pregnant. There is a potential for serious side effects to an unborn child. Talk to your health care professional or pharmacist for more information. Do not breast-feed an infant while taking this medicine. Men are advised not  to father a child while receiving this medicine. This product may contain alcohol. Ask your pharmacist or healthcare provider if this medicine contains alcohol. Be sure to tell all healthcare providers you are taking this medicine. Certain medicines, like metronidazole and disulfiram, can cause an unpleasant reaction when taken with alcohol. The reaction includes flushing, headache, nausea, vomiting, sweating, and increased thirst. The reaction can last from 30 minutes to several hours. What side effects may I notice from  receiving this medicine? Side effects that you should report to your doctor or health care professional as soon as possible: allergic reactions like skin rash, itching or hives, swelling of the face, lips, or tongue breathing problems changes in vision fast, irregular heartbeat high or low blood pressure mouth sores pain, tingling, numbness in the hands or feet signs of decreased platelets or bleeding - bruising, pinpoint red spots on the skin, black, tarry stools, blood in the urine signs of decreased red blood cells - unusually weak or tired, feeling faint or lightheaded, falls signs of infection - fever or chills, cough, sore throat, pain or difficulty passing urine signs and symptoms of liver injury like dark yellow or brown urine; general ill feeling or flu-like symptoms; light-colored stools; loss of appetite; nausea; right upper belly pain; unusually weak or tired; yellowing of the eyes or skin swelling of the ankles, feet, hands unusually slow heartbeat Side effects that usually do not require medical attention (report to your doctor or health care professional if they continue or are bothersome): diarrhea hair loss loss of appetite muscle or joint pain nausea, vomiting pain, redness, or irritation at site where injected tiredness This list may not describe all possible side effects. Call your doctor for medical advice about side effects. You may report side effects to FDA at 1-800-FDA-1088. Where should I keep my medicine? This drug is given in a hospital or clinic and will not be stored at home. NOTE: This sheet is a summary. It may not cover all possible information. If you have questions about this medicine, talk to your doctor, pharmacist, or health care provider.  2020 Elsevier/Gold Standard (2017-01-30 13:14:55)

## 2020-04-13 ENCOUNTER — Encounter: Payer: Self-pay | Admitting: Hematology & Oncology

## 2020-04-13 LAB — CA 125: Cancer Antigen (CA) 125: 19.6 U/mL (ref 0.0–38.1)

## 2020-04-20 ENCOUNTER — Other Ambulatory Visit: Payer: Self-pay | Admitting: Hematology & Oncology

## 2020-04-20 ENCOUNTER — Encounter: Payer: Self-pay | Admitting: Hematology & Oncology

## 2020-04-20 DIAGNOSIS — C541 Malignant neoplasm of endometrium: Secondary | ICD-10-CM

## 2020-04-23 ENCOUNTER — Encounter: Payer: Self-pay | Admitting: *Deleted

## 2020-04-23 NOTE — Progress Notes (Signed)
Patient is at the oral surgeons office. She has an infection in her back, right, molar with a crack in the tooth. The MD wants to start her on Amoxicillin and remove the tooth next week. Patient wants to know if this is ok. She also is worried this will impact her treatment currently planned for 05/11/20.  Spoke with Dr Marin Olp. He is okay with oral surgeon's treatment plan. Unless there are complications, this shouldn't impact her treatment.  Patient is aware of Dr Antonieta Pert response.

## 2020-04-27 ENCOUNTER — Other Ambulatory Visit: Payer: 59

## 2020-04-27 ENCOUNTER — Ambulatory Visit: Payer: 59

## 2020-04-27 ENCOUNTER — Ambulatory Visit: Payer: 59 | Admitting: Hematology & Oncology

## 2020-05-03 ENCOUNTER — Ambulatory Visit (HOSPITAL_COMMUNITY)
Admission: RE | Admit: 2020-05-03 | Discharge: 2020-05-03 | Disposition: A | Discharge status: Home / Self Care | Payer: 59 | Source: Ambulatory Visit | Attending: Hematology & Oncology | Admitting: Hematology & Oncology

## 2020-05-03 ENCOUNTER — Other Ambulatory Visit: Payer: Self-pay

## 2020-05-03 ENCOUNTER — Encounter: Payer: Self-pay | Admitting: *Deleted

## 2020-05-03 DIAGNOSIS — C541 Malignant neoplasm of endometrium: Secondary | ICD-10-CM | POA: Diagnosis not present

## 2020-05-03 LAB — GLUCOSE, CAPILLARY: Glucose-Capillary: 85 mg/dL (ref 70–99)

## 2020-05-03 MED ORDER — FLUDEOXYGLUCOSE F - 18 (FDG) INJECTION
5.9400 | Freq: Once | INTRAVENOUS | Status: AC
  Administered 2020-05-03: 5.94 via INTRAVENOUS

## 2020-05-04 ENCOUNTER — Ambulatory Visit: Payer: 59 | Admitting: Hematology & Oncology

## 2020-05-04 ENCOUNTER — Ambulatory Visit: Payer: 59

## 2020-05-04 ENCOUNTER — Other Ambulatory Visit: Payer: 59

## 2020-05-10 ENCOUNTER — Ambulatory Visit (HOSPITAL_COMMUNITY): Payer: 59

## 2020-05-11 ENCOUNTER — Inpatient Hospital Stay: Payer: 59

## 2020-05-11 ENCOUNTER — Other Ambulatory Visit: Payer: Self-pay

## 2020-05-11 ENCOUNTER — Inpatient Hospital Stay (HOSPITAL_BASED_OUTPATIENT_CLINIC_OR_DEPARTMENT_OTHER): Payer: 59 | Admitting: Hematology & Oncology

## 2020-05-11 ENCOUNTER — Encounter: Payer: Self-pay | Admitting: Hematology & Oncology

## 2020-05-11 VITALS — BP 105/65 | HR 68 | Temp 98.3°F | Resp 18 | Wt 122.5 lb

## 2020-05-11 DIAGNOSIS — C541 Malignant neoplasm of endometrium: Secondary | ICD-10-CM | POA: Diagnosis not present

## 2020-05-11 DIAGNOSIS — Z5111 Encounter for antineoplastic chemotherapy: Secondary | ICD-10-CM | POA: Diagnosis not present

## 2020-05-11 LAB — CBC WITH DIFFERENTIAL (CANCER CENTER ONLY)
Abs Immature Granulocytes: 0.02 10*3/uL (ref 0.00–0.07)
Basophils Absolute: 0 10*3/uL (ref 0.0–0.1)
Basophils Relative: 1 %
Eosinophils Absolute: 0.1 10*3/uL (ref 0.0–0.5)
Eosinophils Relative: 2 %
HCT: 31.6 % — ABNORMAL LOW (ref 36.0–46.0)
Hemoglobin: 10.1 g/dL — ABNORMAL LOW (ref 12.0–15.0)
Immature Granulocytes: 1 %
Lymphocytes Relative: 23 %
Lymphs Abs: 0.9 10*3/uL (ref 0.7–4.0)
MCH: 30.4 pg (ref 26.0–34.0)
MCHC: 32 g/dL (ref 30.0–36.0)
MCV: 95.2 fL (ref 80.0–100.0)
Monocytes Absolute: 0.4 10*3/uL (ref 0.1–1.0)
Monocytes Relative: 10 %
Neutro Abs: 2.4 10*3/uL (ref 1.7–7.7)
Neutrophils Relative %: 63 %
Platelet Count: 194 10*3/uL (ref 150–400)
RBC: 3.32 MIL/uL — ABNORMAL LOW (ref 3.87–5.11)
RDW: 15.5 % (ref 11.5–15.5)
WBC Count: 3.7 10*3/uL — ABNORMAL LOW (ref 4.0–10.5)
nRBC: 0 % (ref 0.0–0.2)

## 2020-05-11 LAB — CMP (CANCER CENTER ONLY)
ALT: 13 U/L (ref 0–44)
AST: 15 U/L (ref 15–41)
Albumin: 4.1 g/dL (ref 3.5–5.0)
Alkaline Phosphatase: 58 U/L (ref 38–126)
Anion gap: 12 (ref 5–15)
BUN: 14 mg/dL (ref 6–20)
CO2: 25 mmol/L (ref 22–32)
Calcium: 9.7 mg/dL (ref 8.9–10.3)
Chloride: 101 mmol/L (ref 98–111)
Creatinine: 0.65 mg/dL (ref 0.44–1.00)
GFR, Estimated: >60 mL/min (ref >60)
Glucose, Bld: 61 mg/dL — ABNORMAL LOW (ref 70–99)
Potassium: 3.7 mmol/L (ref 3.5–5.1)
Sodium: 138 mmol/L (ref 135–145)
Total Bilirubin: 0.5 mg/dL (ref 0.3–1.2)
Total Protein: 6.2 g/dL — ABNORMAL LOW (ref 6.5–8.1)

## 2020-05-11 MED ORDER — SODIUM CHLORIDE 0.9 % IV SOLN
10.0000 mg | Freq: Once | INTRAVENOUS | Status: AC
  Administered 2020-05-11: 10 mg via INTRAVENOUS
  Filled 2020-05-11: qty 10

## 2020-05-11 MED ORDER — HEPARIN SOD (PORK) LOCK FLUSH 100 UNIT/ML IV SOLN
500.0000 [IU] | Freq: Once | INTRAVENOUS | Status: AC | PRN
  Administered 2020-05-11: 500 [IU]
  Filled 2020-05-11: qty 5

## 2020-05-11 MED ORDER — FAMOTIDINE IN NACL 20-0.9 MG/50ML-% IV SOLN
INTRAVENOUS | Status: AC
  Filled 2020-05-11: qty 50

## 2020-05-11 MED ORDER — DIPHENHYDRAMINE HCL 50 MG/ML IJ SOLN
12.5000 mg | Freq: Once | INTRAMUSCULAR | Status: AC
  Administered 2020-05-11: 12.5 mg via INTRAVENOUS

## 2020-05-11 MED ORDER — DIPHENHYDRAMINE HCL 50 MG/ML IJ SOLN
INTRAMUSCULAR | Status: AC
  Filled 2020-05-11: qty 1

## 2020-05-11 MED ORDER — SODIUM CHLORIDE 0.9 % IV SOLN
380.4000 mg | Freq: Once | INTRAVENOUS | Status: AC
  Administered 2020-05-11: 380 mg via INTRAVENOUS
  Filled 2020-05-11: qty 38

## 2020-05-11 MED ORDER — FAMOTIDINE IN NACL 20-0.9 MG/50ML-% IV SOLN
20.0000 mg | Freq: Once | INTRAVENOUS | Status: AC
  Administered 2020-05-11: 20 mg via INTRAVENOUS

## 2020-05-11 MED ORDER — SODIUM CHLORIDE 0.9 % IV SOLN
126.0000 mg/m2 | Freq: Once | INTRAVENOUS | Status: AC
  Administered 2020-05-11: 210 mg via INTRAVENOUS
  Filled 2020-05-11: qty 35

## 2020-05-11 MED ORDER — ONDANSETRON HCL 4 MG/2ML IJ SOLN
INTRAMUSCULAR | Status: AC
  Filled 2020-05-11: qty 8

## 2020-05-11 MED ORDER — SODIUM CHLORIDE 0.9 % IV SOLN
150.0000 mg | Freq: Once | INTRAVENOUS | Status: AC
  Administered 2020-05-11: 150 mg via INTRAVENOUS
  Filled 2020-05-11: qty 150

## 2020-05-11 MED ORDER — SODIUM CHLORIDE 0.9 % IV SOLN
16.0000 mg | Freq: Once | INTRAVENOUS | Status: AC
  Administered 2020-05-11: 16 mg via INTRAVENOUS
  Filled 2020-05-11: qty 8

## 2020-05-11 MED ORDER — SODIUM CHLORIDE 0.9 % IV SOLN
Freq: Once | INTRAVENOUS | Status: AC
  Filled 2020-05-11: qty 250

## 2020-05-11 MED ORDER — SODIUM CHLORIDE 0.9% FLUSH
10.0000 mL | INTRAVENOUS | Status: DC | PRN
  Administered 2020-05-11: 10 mL
  Filled 2020-05-11: qty 10

## 2020-05-11 NOTE — Progress Notes (Signed)
Hematology and Oncology Follow Up Visit  Sherry Williams [id number] 14-Nov-1960 59 y.o. 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  I must say that she is done incredibly well.  Her last CA-125 was down to 20.  She has responded beautifully to treatment.  We did do a PET scan on her.  Essentially, the PET scan looked normal.  All activity was gone with respect to her lymph nodes.  She had a tough time with the last cycle of chemotherapy.  I think she had a lot of diarrhea.  She has some nausea.  She just feels more fatigued.  She thinks that this will be her last cycle of treatment.  I certainly understand this.  I told her that the standard is 6 cycles of treatment.  Even though we cannot see any obvious malignancy, there is that distinct possibility that she has micrometastatic disease that we just cannot pick up on her scans.  I think she would be a good candidate for olaparib given that the tumor is BRCA2 positive.  I think this would not be a bad idea for maintenance therapy.  She did have a nice Thanksgiving with her family.  I am happy for her.  She is trying to exercise a little bit.  She is walking a little bit more.  She has been having some problems with a tooth over on the right mandibular area.  She had a molar removed.  There is an abscess.  She was on antibiotics.  She has had some bleeding.  I think it is starting to heal up.  Being on blood thinners certainly has made the bleeding a little bit more prominent.  She has had no fever.  She has had no problems with swallowing.  There is no cough or shortness of breath.  She has had no issues with  bowels or bladder at the present time.  Currently, her performance status is ECOG 1.    Medications:  Current Outpatient Medications:  .  ASPIRIN 81 PO, Take by mouth daily., Disp: , Rfl:  .  atorvastatin (LIPITOR) 40 MG tablet, , Disp: , Rfl:  .  dexamethasone (DECADRON) 4 MG tablet, Take 2 tablets (8 mg total) by mouth daily. Start the day after carboplatin chemotherapy for 3 days., Disp: 30 tablet, Rfl: 1 .  fondaparinux (ARIXTRA) 7.5 MG/0.6ML SOLN injection, ADMINISTER 0.6 ML(7.5 MG) UNDER THE SKIN DAILY, Disp: 18 mL, Rfl: 6 .  lidocaine-prilocaine (EMLA) cream, Apply to affected area once, Disp: 30 g, Rfl: 3 .  ondansetron (ZOFRAN) 8 MG tablet, TAKE 1 TABLET BY MOUTH TWICE DAILY AS NEEDED FOR REFRACTORY NAUSEA/ VOMITING. START ON DAY 3 AFTER CARBOPLATIN CHEMO, Disp: 30 tablet, Rfl: 1 .  prochlorperazine (COMPAZINE) 10 MG tablet, TAKE 1 TABLET(10 MG) BY MOUTH EVERY 6 HOURS AS NEEDED FOR NAUSEA OR VOMITING, Disp: 30 tablet, Rfl: 1 .  thyroid (ARMOUR THYROID) 60 MG tablet, TAKE 1 TABLET BY MOUTH EVERY DAY BEFORE BREAKFAST. TAKE 1 DAILY 5 DAYS PER WEEK, Disp: 90 tablet, Rfl: 2 .  thyroid (ARMOUR THYROID) 90 MG tablet, TAKE 1 TABLET BY MOUTH EVERY DAY TWO DAYS PER WEEK,  Disp: 90 tablet, Rfl: 1 .  amoxicillin (AMOXIL) 500 MG capsule, Take 500 mg by mouth 3 (three) times daily., Disp: , Rfl:  .  LORazepam (ATIVAN) 0.5 MG tablet, Take 1 tablet (0.5 mg total) by mouth every 6 (six) hours as needed (Nausea or vomiting). (Patient not taking: Reported on 03/16/2020), Disp: 30 tablet, Rfl: 0 .  traMADol (ULTRAM) 50 MG tablet, Take 50 mg by mouth every 6 (six) hours as needed., Disp: , Rfl:   Allergies:  Allergies  Allergen Reactions  . Bee Venom Anaphylaxis and Swelling  . Adhesive [Tape] Other (See Comments)    Irritation and red  . Aspartame Diarrhea  . Aspartame And Phenylalanine Diarrhea    Past Medical History, Surgical history, Social history, and Family History were reviewed and  updated.  Review of Systems: Review of Systems  Constitutional: Negative.   HENT: Negative.   Eyes: Negative.   Respiratory: Negative.   Cardiovascular: Negative.   Gastrointestinal: Positive for abdominal pain.  Genitourinary: Negative.   Musculoskeletal: Negative.   Skin: Negative.   Neurological: Negative.   Endo/Heme/Allergies: Negative.   Psychiatric/Behavioral: Negative.     Physical Exam:  weight is 122 lb 8 oz (55.6 kg). Her oral temperature is 98.3 F (36.8 C). Her blood pressure is 105/65 and her pulse is 68. Her respiration is 18 and oxygen saturation is 100%.   Wt Readings from Last 3 Encounters:  05/11/20 122 lb 8 oz (55.6 kg)  04/12/20 124 lb (56.2 kg)  03/16/20 124 lb 12 oz (56.6 kg)    Physical Exam Vitals reviewed.  HENT:     Head: Normocephalic and atraumatic.  Eyes:     Pupils: Pupils are equal, round, and reactive to light.  Cardiovascular:     Rate and Rhythm: Normal rate and regular rhythm.     Heart sounds: Normal heart sounds.  Pulmonary:     Effort: Pulmonary effort is normal.     Breath sounds: Normal breath sounds.  Abdominal:     General: Bowel sounds are normal.     Palpations: Abdomen is soft.     Comments: Abdominal exam shows a soft abdomen.  She has well healing laparoscopic scars.  I think there are 5 laparoscopic scars.  She has no erythema or exudate or warmth associated with these.  She has no fluid wave in the abdomen.  There is no guarding or rebound tenderness.  She has no palpable liver or spleen tip.  Musculoskeletal:        General: No tenderness or deformity. Normal range of motion.     Cervical back: Normal range of motion.  Lymphadenopathy:     Cervical: No cervical adenopathy.  Skin:    General: Skin is warm and dry.     Findings: No erythema or rash.  Neurological:     Mental Status: She is alert and oriented to person, place, and time.  Psychiatric:        Behavior: Behavior normal.        Thought Content:  Thought content normal.        Judgment: Judgment normal.      Lab Results  Component Value Date   WBC 3.7 (L) 05/11/2020   HGB 10.1 (L) 05/11/2020   HCT 31.6 (L) 05/11/2020   MCV 95.2 05/11/2020   PLT 194 05/11/2020     Chemistry      Component Value Date/Time   NA 138 05/11/2020 0825   NA 142 02/15/2017 1518  NA 140 04/13/2016 1055   K 3.7 05/11/2020 0825   K 4.1 02/15/2017 1518   K 4.1 04/13/2016 1055   CL 101 05/11/2020 0825   CL 107 02/15/2017 1518   CO2 25 05/11/2020 0825   CO2 29 02/15/2017 1518   CO2 27 04/13/2016 1055   BUN 14 05/11/2020 0825   BUN 17 02/15/2017 1518   BUN 22.1 04/13/2016 1055   CREATININE 0.65 05/11/2020 0825   CREATININE 1.0 02/15/2017 1518   CREATININE 0.8 04/13/2016 1055      Component Value Date/Time   CALCIUM 9.7 05/11/2020 0825   CALCIUM 9.3 02/15/2017 1518   CALCIUM 9.9 04/13/2016 1055   ALKPHOS 58 05/11/2020 0825   ALKPHOS 79 02/15/2017 1518   ALKPHOS 81 04/13/2016 1055   AST 15 05/11/2020 0825   AST 20 04/13/2016 1055   ALT 13 05/11/2020 0825   ALT 24 02/15/2017 1518   ALT 22 04/13/2016 1055   BILITOT 0.5 05/11/2020 0825   BILITOT 0.44 04/13/2016 1055      Impression and Plan: Sherry Williams is a 59 year old Caucasian female.  She has had issues with recurrence of thromboembolic events.  She has had these systemically and in the brain.  I still think that the "key" to this tumor is the fact that it is BRCA2 positive.  I know she had the genetic test for BRCA and was negative.  As such this is a somatic mutation that is in the cancer.  I reassured her that this does not really put her at high risk for breast cancer.  We will proceed with her fourth and final cycle of treatment.  She just does not want to have any more treatment.  She is worried about the damaging effects that treatment might have on a regular cells.  I am just happy that her response has been so quick and so profound.  We will plan to get her back now after  the Christmas holiday.  At that point, we will see about getting her on maintenance olaparib.    [name], MD 11/30/20219:07 AM

## 2020-05-11 NOTE — Patient Instructions (Addendum)
Paclitaxel injection What is this medicine? PACLITAXEL (PAK li TAX el) is a chemotherapy drug. It targets fast dividing cells, like cancer cells, and causes these cells to die. This medicine is used to treat ovarian cancer, breast cancer, lung cancer, Kaposi's sarcoma, and other cancers. This medicine may be used for other purposes; ask your health care provider or pharmacist if you have questions. COMMON BRAND NAME(S): Onxol, Taxol What should I tell my health care provider before I take this medicine? They need to know if you have any of these conditions:  history of irregular heartbeat  liver disease  low blood counts, like low white cell, platelet, or red cell counts  lung or breathing disease, like asthma  tingling of the fingers or toes, or other nerve disorder  an unusual or allergic reaction to paclitaxel, alcohol, polyoxyethylated castor oil, other chemotherapy, other medicines, foods, dyes, or preservatives  pregnant or trying to get pregnant  breast-feeding How should I use this medicine? This drug is given as an infusion into a vein. It is administered in a hospital or clinic by a specially trained health care professional. Talk to your pediatrician regarding the use of this medicine in children. Special care may be needed. Overdosage: If you think you have taken too much of this medicine contact a poison control center or emergency room at once. NOTE: This medicine is only for you. Do not share this medicine with others. What if I miss a dose? It is important not to miss your dose. Call your doctor or health care professional if you are unable to keep an appointment. What may interact with this medicine? Do not take this medicine with any of the following medications:  disulfiram  metronidazole This medicine may also interact with the following medications:  antiviral medicines for hepatitis, HIV or AIDS  certain antibiotics like erythromycin and  clarithromycin  certain medicines for fungal infections like ketoconazole and itraconazole  certain medicines for seizures like carbamazepine, phenobarbital, phenytoin  gemfibrozil  nefazodone  rifampin  St. John's wort This list may not describe all possible interactions. Give your health care provider a list of all the medicines, herbs, non-prescription drugs, or dietary supplements you use. Also tell them if you smoke, drink alcohol, or use illegal drugs. Some items may interact with your medicine. What should I watch for while using this medicine? Your condition will be monitored carefully while you are receiving this medicine. You will need important blood work done while you are taking this medicine. This medicine can cause serious allergic reactions. To reduce your risk you will need to take other medicine(s) before treatment with this medicine. If you experience allergic reactions like skin rash, itching or hives, swelling of the face, lips, or tongue, tell your doctor or health care professional right away. In some cases, you may be given additional medicines to help with side effects. Follow all directions for their use. This drug may make you feel generally unwell. This is not uncommon, as chemotherapy can affect healthy cells as well as cancer cells. Report any side effects. Continue your course of treatment even though you feel ill unless your doctor tells you to stop. Call your doctor or health care professional for advice if you get a fever, chills or sore throat, or other symptoms of a cold or flu. Do not treat yourself. This drug decreases your body's ability to fight infections. Try to avoid being around people who are sick. This medicine may increase your risk to bruise   or bleed. Call your doctor or health care professional if you notice any unusual bleeding. Be careful brushing and flossing your teeth or using a toothpick because you may get an infection or bleed more easily.  If you have any dental work done, tell your dentist you are receiving this medicine. Avoid taking products that contain aspirin, acetaminophen, ibuprofen, naproxen, or ketoprofen unless instructed by your doctor. These medicines may hide a fever. Do not become pregnant while taking this medicine. Women should inform their doctor if they wish to become pregnant or think they might be pregnant. There is a potential for serious side effects to an unborn child. Talk to your health care professional or pharmacist for more information. Do not breast-feed an infant while taking this medicine. Men are advised not to father a child while receiving this medicine. This product may contain alcohol. Ask your pharmacist or healthcare provider if this medicine contains alcohol. Be sure to tell all healthcare providers you are taking this medicine. Certain medicines, like metronidazole and disulfiram, can cause an unpleasant reaction when taken with alcohol. The reaction includes flushing, headache, nausea, vomiting, sweating, and increased thirst. The reaction can last from 30 minutes to several hours. What side effects may I notice from receiving this medicine? Side effects that you should report to your doctor or health care professional as soon as possible:  allergic reactions like skin rash, itching or hives, swelling of the face, lips, or tongue  breathing problems  changes in vision  fast, irregular heartbeat  high or low blood pressure  mouth sores  pain, tingling, numbness in the hands or feet  signs of decreased platelets or bleeding - bruising, pinpoint red spots on the skin, black, tarry stools, blood in the urine  signs of decreased red blood cells - unusually weak or tired, feeling faint or lightheaded, falls  signs of infection - fever or chills, cough, sore throat, pain or difficulty passing urine  signs and symptoms of liver injury like dark yellow or brown urine; general ill feeling or  flu-like symptoms; light-colored stools; loss of appetite; nausea; right upper belly pain; unusually weak or tired; yellowing of the eyes or skin  swelling of the ankles, feet, hands  unusually slow heartbeat Side effects that usually do not require medical attention (report to your doctor or health care professional if they continue or are bothersome):  diarrhea  hair loss  loss of appetite  muscle or joint pain  nausea, vomiting  pain, redness, or irritation at site where injected  tiredness This list may not describe all possible side effects. Call your doctor for medical advice about side effects. You may report side effects to FDA at 1-800-FDA-1088. Where should I keep my medicine? This drug is given in a hospital or clinic and will not be stored at home. NOTE: This sheet is a summary. It may not cover all possible information. If you have questions about this medicine, talk to your doctor, pharmacist, or health care provider.  2020 Elsevier/Gold Standard (2017-01-30 13:14:55) Carboplatin injection What is this medicine? CARBOPLATIN (KAR boe pla tin) is a chemotherapy drug. It targets fast dividing cells, like cancer cells, and causes these cells to die. This medicine is used to treat ovarian cancer and many other cancers. This medicine may be used for other purposes; ask your health care provider or pharmacist if you have questions. COMMON BRAND NAME(S): Paraplatin What should I tell my health care provider before I take this medicine? They need to   know if you have any of these conditions:  blood disorders  hearing problems  kidney disease  recent or ongoing radiation therapy  an unusual or allergic reaction to carboplatin, cisplatin, other chemotherapy, other medicines, foods, dyes, or preservatives  pregnant or trying to get pregnant  breast-feeding How should I use this medicine? This drug is usually given as an infusion into a vein. It is administered in a  hospital or clinic by a specially trained health care professional. Talk to your pediatrician regarding the use of this medicine in children. Special care may be needed. Overdosage: If you think you have taken too much of this medicine contact a poison control center or emergency room at once. NOTE: This medicine is only for you. Do not share this medicine with others. What if I miss a dose? It is important not to miss a dose. Call your doctor or health care professional if you are unable to keep an appointment. What may interact with this medicine?  medicines for seizures  medicines to increase blood counts like filgrastim, pegfilgrastim, sargramostim  some antibiotics like amikacin, gentamicin, neomycin, streptomycin, tobramycin  vaccines Talk to your doctor or health care professional before taking any of these medicines:  acetaminophen  aspirin  ibuprofen  ketoprofen  naproxen This list may not describe all possible interactions. Give your health care provider a list of all the medicines, herbs, non-prescription drugs, or dietary supplements you use. Also tell them if you smoke, drink alcohol, or use illegal drugs. Some items may interact with your medicine. What should I watch for while using this medicine? Your condition will be monitored carefully while you are receiving this medicine. You will need important blood work done while you are taking this medicine. This drug may make you feel generally unwell. This is not uncommon, as chemotherapy can affect healthy cells as well as cancer cells. Report any side effects. Continue your course of treatment even though you feel ill unless your doctor tells you to stop. In some cases, you may be given additional medicines to help with side effects. Follow all directions for their use. Call your doctor or health care professional for advice if you get a fever, chills or sore throat, or other symptoms of a cold or flu. Do not treat  yourself. This drug decreases your body's ability to fight infections. Try to avoid being around people who are sick. This medicine may increase your risk to bruise or bleed. Call your doctor or health care professional if you notice any unusual bleeding. Be careful brushing and flossing your teeth or using a toothpick because you may get an infection or bleed more easily. If you have any dental work done, tell your dentist you are receiving this medicine. Avoid taking products that contain aspirin, acetaminophen, ibuprofen, naproxen, or ketoprofen unless instructed by your doctor. These medicines may hide a fever. Do not become pregnant while taking this medicine. Women should inform their doctor if they wish to become pregnant or think they might be pregnant. There is a potential for serious side effects to an unborn child. Talk to your health care professional or pharmacist for more information. Do not breast-feed an infant while taking this medicine. What side effects may I notice from receiving this medicine? Side effects that you should report to your doctor or health care professional as soon as possible:  allergic reactions like skin rash, itching or hives, swelling of the face, lips, or tongue  signs of infection - fever or   chills, cough, sore throat, pain or difficulty passing urine  signs of decreased platelets or bleeding - bruising, pinpoint red spots on the skin, black, tarry stools, nosebleeds  signs of decreased red blood cells - unusually weak or tired, fainting spells, lightheadedness  breathing problems  changes in hearing  changes in vision  chest pain  high blood pressure  low blood counts - This drug may decrease the number of white blood cells, red blood cells and platelets. You may be at increased risk for infections and bleeding.  nausea and vomiting  pain, swelling, redness or irritation at the injection site  pain, tingling, numbness in the hands or  feet  problems with balance, talking, walking  trouble passing urine or change in the amount of urine Side effects that usually do not require medical attention (report to your doctor or health care professional if they continue or are bothersome):  hair loss  loss of appetite  metallic taste in the mouth or changes in taste This list may not describe all possible side effects. Call your doctor for medical advice about side effects. You may report side effects to FDA at 1-800-FDA-1088. Where should I keep my medicine? This drug is given in a hospital or clinic and will not be stored at home. NOTE: This sheet is a summary. It may not cover all possible information. If you have questions about this medicine, talk to your doctor, pharmacist, or health care provider.  2020 Elsevier/Gold Standard (2007-09-03 14:38:05) Pt discharged in no apparent distress. Pt left ambulatory without assistance. Pt aware of discharge instructions and verbalized understanding and had no further questions.

## 2020-05-11 NOTE — Patient Instructions (Signed)
Tunneled Central Venous Catheter Flushing Guide  It is important to flush your tunneled central venous catheter each time you use it, both before and after you use it. Flushing your catheter will help prevent it from clogging. What are the risks? Risks may include:  Infection.  Air getting into the catheter and bloodstream. Supplies needed:  A clean pair of gloves.  A disinfecting wipe. Use an alcohol wipe, chlorhexidine wipe, or iodine wipe as told by your health care provider.  A 10 mL syringe that has been prefilled with saline solution.  An empty 10 mL syringe, if a substance called heparin was injected into your catheter. How to flush your catheter When you flush your catheter, make sure you follow any specific instructions from your health care provider or the manufacturer. These are general guidelines. Flushing your catheter before use If there is heparin in your catheter: 1. Wash your hands with soap and water. 2. Put on gloves. 3. Scrub the injection cap for a minimum of 15 seconds with a disinfecting wipe. 4. Unclamp the catheter. 5. Attach the empty syringe to the injection cap. 6. Pull the syringe plunger back and withdraw 10 mL of blood. 7. Place the syringe into an appropriate waste container. 8. Scrub the injection cap for 15 seconds with a disinfecting wipe. 9. Attach the prefilled syringe to the injection cap. 10. Flush the catheter by pushing the plunger forward until all the liquid from the syringe is in the catheter. 11. Remove the syringe from the injection cap. 12. Clamp the catheter. If there is no heparin in your catheter: 1. Wash your hands with soap and water. 2. Put on gloves. 3. Scrub the injection cap for 15 seconds with a disinfecting wipe. 4. Unclamp the catheter. 5. Attach the prefilled syringe to the injection cap. 6. Flush the catheter by pushing the plunger forward until 5 mL of the liquid from the syringe is in the catheter. 7. Pull back on  the syringe until you see blood in the catheter. 8. If you have been asked to collect any blood, follow your health care provider's instructions. Otherwise, flush the catheter with the rest of the solution from the syringe. 9. Remove the syringe from the injection cap. 10. Clamp the catheter.  Flushing your catheter after use 1. Wash your hands with soap and water. 2. Put on gloves. 3. Scrub the injection cap for 15 seconds with a disinfecting wipe. 4. Unclamp the catheter. 5. Attach the prefilled syringe to the injection cap. 6. Flush the catheter by pushing the plunger forward until all of the liquid from the syringe is in the catheter. 7. Remove the syringe from the injection cap. 8. Clamp the catheter. Problems and solutions  If blood cannot be completely cleared from the injection cap, you may need to have the injection cap replaced.  If the catheter is difficult to flush, use the pulsing method. The pulsing method involves pushing only a few milliliters of solution into the catheter at a time and pausing between pushes.  If you do not see blood in the catheter when you pull back on the syringe, change your body position, such as by raising your arms above your head. Take a deep breath and cough. Then, pull back on the syringe. If you still do not see blood, flush the catheter with a small amount of solution. Then, change positions again and take a breath or cough. Pull back on the syringe again. If you still do not see   blood, finish flushing the catheter and contact your health care provider. Do not use your catheter until your health care provider says it is okay. General tips  Have someone help you flush your catheter, if possible.  Do not force fluid through your catheter.  Do not use a syringe that is larger or smaller than 10 mL. Using a smaller syringe can make the catheter burst.  Do not use your catheter without flushing it first if it has heparin in it. Contact a health  care provider if:  You cannot see any blood in the catheter when you flush it before using it.  Your catheter is difficult to flush. Get help right away if:  You cannot flush the catheter.  The catheter leaks when you flush it or when there is fluid in it.  There are cracks or breaks in the catheter. Summary  It is important to flush your tunneled central venous catheter each time you use it, both before and after you use it.  Scrub the injection cap for 15 seconds with a disinfecting wipe before and after you flush it.  When you flush your catheter, make sure you follow any specific instructions from your health care provider or the manufacturer.  Get help right away if you cannot flush the catheter. This information is not intended to replace advice given to you by your health care provider. Make sure you discuss any questions you have with your health care provider. Document Revised: 02/21/2019 Document Reviewed: 08/14/2018 Elsevier Patient Education  2020 [employer]  

## 2020-05-12 LAB — CA 125: Cancer Antigen (CA) 125: 10.2 U/mL (ref 0.0–38.1)

## 2020-05-18 ENCOUNTER — Other Ambulatory Visit: Payer: 59

## 2020-05-18 ENCOUNTER — Ambulatory Visit: Payer: 59

## 2020-05-18 ENCOUNTER — Ambulatory Visit: Payer: 59 | Admitting: Hematology & Oncology

## 2020-05-24 ENCOUNTER — Inpatient Hospital Stay: Payer: 59

## 2020-05-24 ENCOUNTER — Inpatient Hospital Stay: Payer: 59 | Admitting: Hematology & Oncology

## 2020-06-08 ENCOUNTER — Ambulatory Visit: Payer: 59

## 2020-06-08 ENCOUNTER — Other Ambulatory Visit: Payer: 59

## 2020-06-08 ENCOUNTER — Ambulatory Visit: Payer: 59 | Admitting: Hematology & Oncology

## 2020-06-11 ENCOUNTER — Encounter: Payer: Self-pay | Admitting: Hematology & Oncology

## 2020-06-14 ENCOUNTER — Inpatient Hospital Stay: Payer: 59 | Admitting: Hematology & Oncology

## 2020-06-14 ENCOUNTER — Inpatient Hospital Stay: Payer: 59

## 2020-06-15 ENCOUNTER — Telehealth: Payer: Self-pay

## 2020-06-15 IMAGING — MR MR HEAD WO/W CM
12 of 14 series · 28 of 48 positions shown · IV contrast (GADAVIST)
Comparison: 07/29/2019

CLINICAL DATA: Stroke, follow-up

EXAM:
MRI HEAD WITHOUT AND WITH CONTRAST
TECHNIQUE: Multiplanar, multiecho pulse sequences of the brain and surrounding
structures were obtained without and with intravenous contrast.
CONTRAST:  6mL GADAVIST GADOBUTROL 1 MMOL/ML IV SOLN

[Series 2: T1 · sagittal · 5.0mm · 0.45mm/px · 1 of 23 slices shown]
[im 1/23]
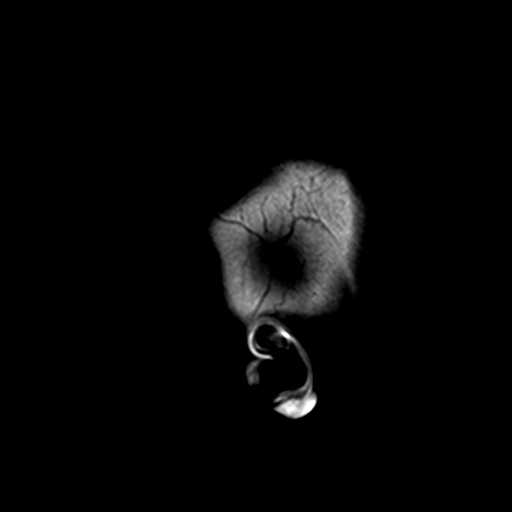

[Series 3: DWI · axial · 3.0mm · 2.19mm/px · z∈[-79,+74]mm · 5 of 96 slices shown (1 of 4)]
[im 1/96]
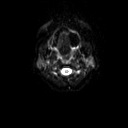
[im 24/96]
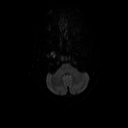
[im 48/96]
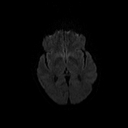
[im 72/96]
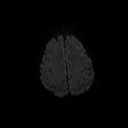
[im 96/96]
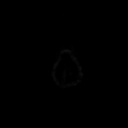

[Series 4: DWI · axial · 3.0mm · 2.19mm/px · z∈[-79,+74]mm · 3 of 48 slices shown (2 of 4)]
[im 1/48]
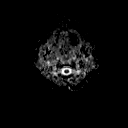
[im 24/48]
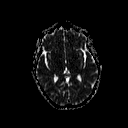
[im 48/48]
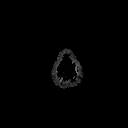

[Series 5: DWI · coronal · 3.0mm · 1.46mm/px · 6 of 98 slices shown (3 of 4)]
[im 1/98]
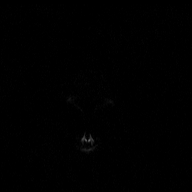
[im 20/98]
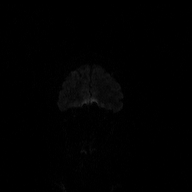
[im 39/98]
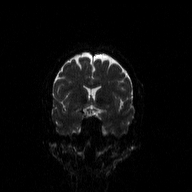
[im 59/98]
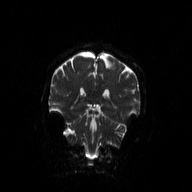
[im 78/98]
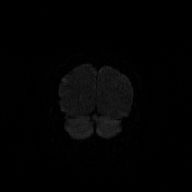
[im 98/98]
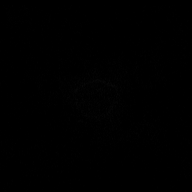

[Series 6: DWI · coronal · 3.0mm · 1.46mm/px · 3 of 48 slices shown (4 of 4)]
[im 1/48]
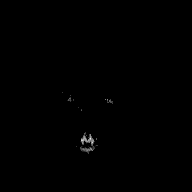
[im 24/48]
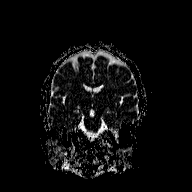
[im 48/48]
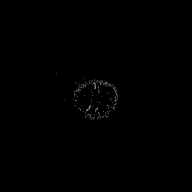

[Series 7: T2 · axial · 5.0mm · 0.45mm/px · 1 of 23 slices shown (1 of 2)]
[im 1/23]
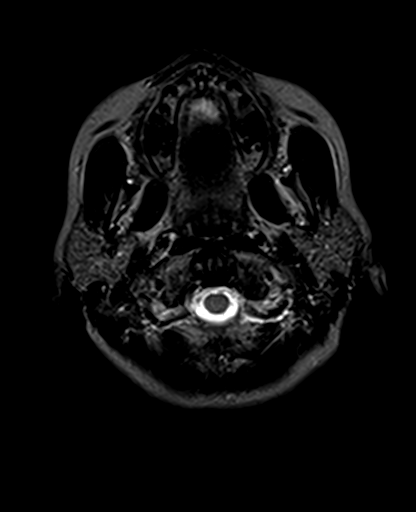

[Series 8: T2 · axial · 5.0mm · 0.45mm/px · 1 of 23 slices shown (2 of 2)]
[im 1/23]
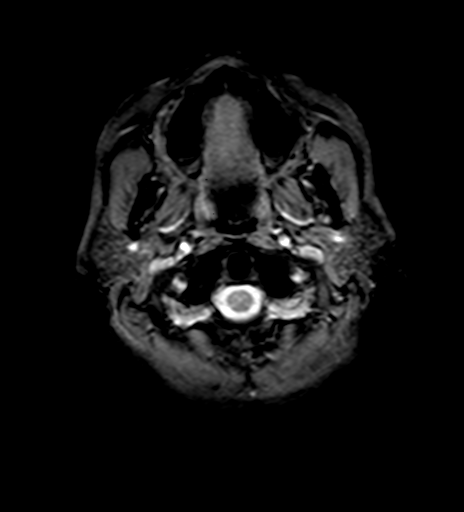

[Series 9: FLAIR · axial · 3.0mm · 0.45mm/px · z∈[-72,+83]mm · 2 of 40 slices shown]
[im 1/40]
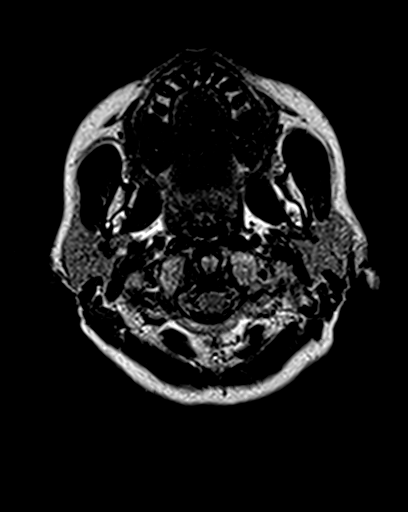
[im 40/40]
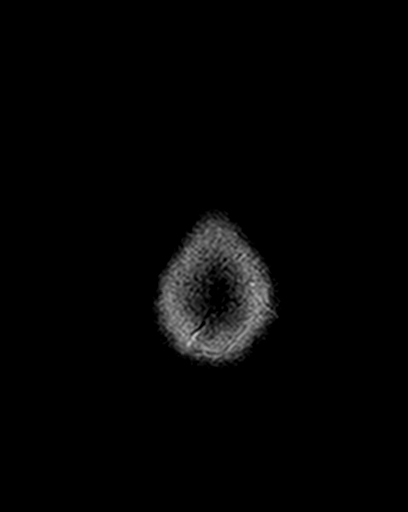

[Series 11: T2 post-contrast · coronal · 5.0mm · 0.45mm/px · 2 of 28 slices shown]
[im 1/28]
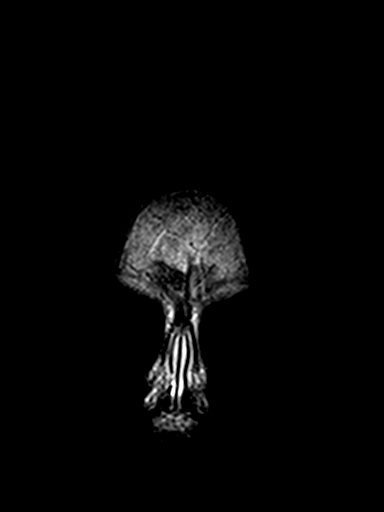
[im 28/28]
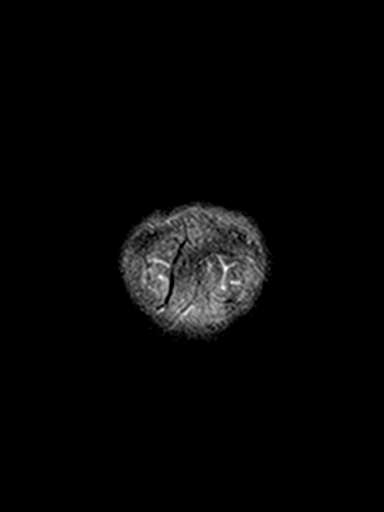

[Series 13: T1 post-contrast · coronal · 5.0mm · 0.45mm/px · 2 of 28 slices shown (1 of 2)]
[im 1/28]
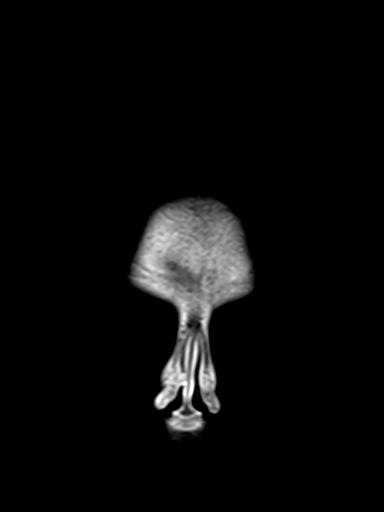
[im 28/28]
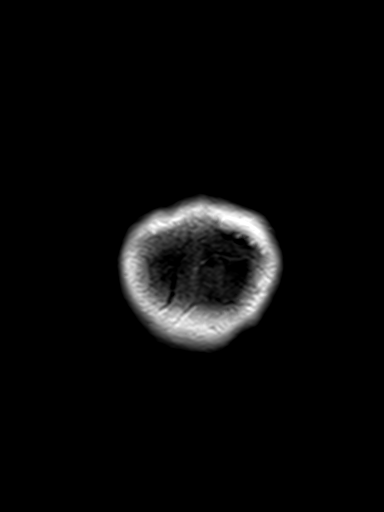

[Series 14: T1 post-contrast · sagittal · 5.0mm · 0.45mm/px · 1 of 23 slices shown (2 of 2)]
[im 1/23]
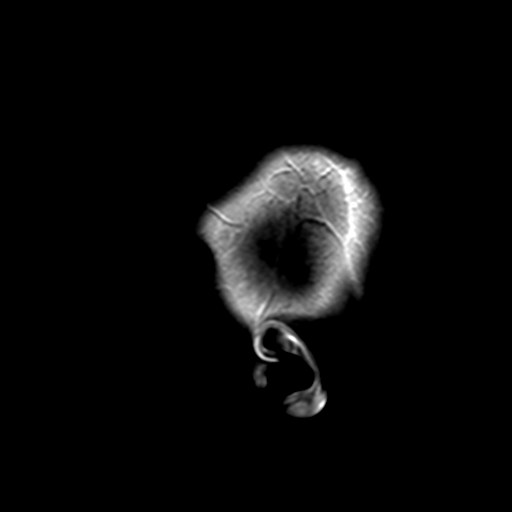

[Series 100: hx · axial · 10.0mm · 0.55mm/px · 1 of 9 slices shown]
[im 1/9]
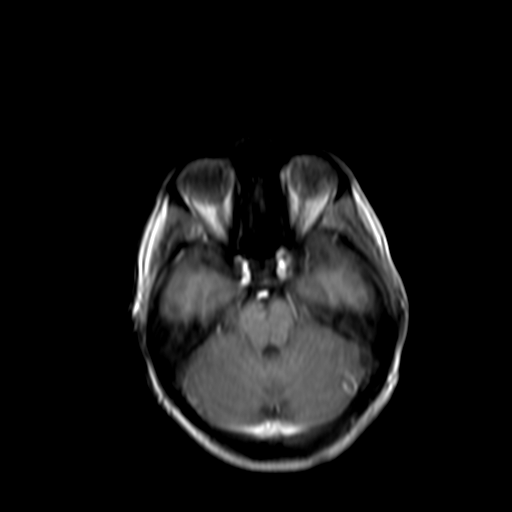

[28 of 48 positions shown; findings below may reference images not displayed]

FINDINGS: Brain: There is no acute infarction or intracranial hemorrhage.
There is no intracranial mass, mass effect, or edema. There is no
hydrocephalus or extra-axial fluid collection. There is some
residual T2 FLAIR hyperintensity in areas of infarction on prior
study particularly in the right parietal lobe likely reflecting
gliosis. Additional few patchy foci of T2 hyperintensity in the
supratentorial white matter likely reflect stable minor chronic
microvascular ischemic changes. No abnormal enhancement.

Vascular: Major vessel flow voids at the skull base are preserved.

Skull and upper cervical spine: Normal marrow signal is preserved.

Sinuses/Orbits: Paranasal sinuses are aerated. Orbits are
unremarkable.

Other: Sella is unremarkable.  Mastoid air cells are clear.
IMPRESSION: No acute intracranial abnormality.  Sequelae of prior infarcts.

## 2020-06-15 NOTE — Telephone Encounter (Signed)
Pt saw r/s appts on my chart per +cov and she confirmed ok with appt time...Sherry Williams

## 2020-07-08 ENCOUNTER — Inpatient Hospital Stay: Payer: 59 | Attending: Hematology & Oncology

## 2020-07-08 ENCOUNTER — Other Ambulatory Visit: Payer: Self-pay

## 2020-07-08 ENCOUNTER — Inpatient Hospital Stay (HOSPITAL_BASED_OUTPATIENT_CLINIC_OR_DEPARTMENT_OTHER): Payer: 59 | Admitting: Hematology & Oncology

## 2020-07-08 ENCOUNTER — Encounter: Payer: Self-pay | Admitting: Hematology & Oncology

## 2020-07-08 ENCOUNTER — Inpatient Hospital Stay: Payer: 59

## 2020-07-08 VITALS — BP 104/57 | HR 63 | Temp 98.8°F | Resp 17 | Wt 125.0 lb

## 2020-07-08 DIAGNOSIS — C541 Malignant neoplasm of endometrium: Secondary | ICD-10-CM

## 2020-07-08 DIAGNOSIS — Z1501 Genetic susceptibility to malignant neoplasm of breast: Secondary | ICD-10-CM | POA: Insufficient documentation

## 2020-07-08 DIAGNOSIS — Z7901 Long term (current) use of anticoagulants: Secondary | ICD-10-CM | POA: Insufficient documentation

## 2020-07-08 DIAGNOSIS — Z79899 Other long term (current) drug therapy: Secondary | ICD-10-CM | POA: Diagnosis not present

## 2020-07-08 DIAGNOSIS — C55 Malignant neoplasm of uterus, part unspecified: Secondary | ICD-10-CM | POA: Diagnosis not present

## 2020-07-08 DIAGNOSIS — I82401 Acute embolism and thrombosis of unspecified deep veins of right lower extremity: Secondary | ICD-10-CM | POA: Insufficient documentation

## 2020-07-08 DIAGNOSIS — Z8673 Personal history of transient ischemic attack (TIA), and cerebral infarction without residual deficits: Secondary | ICD-10-CM | POA: Insufficient documentation

## 2020-07-08 LAB — CBC WITH DIFFERENTIAL (CANCER CENTER ONLY)
Abs Immature Granulocytes: 0.02 10*3/uL (ref 0.00–0.07)
Basophils Absolute: 0 10*3/uL (ref 0.0–0.1)
Basophils Relative: 1 %
Eosinophils Absolute: 0.2 10*3/uL (ref 0.0–0.5)
Eosinophils Relative: 5 %
HCT: 32.1 % — ABNORMAL LOW (ref 36.0–46.0)
Hemoglobin: 10.5 g/dL — ABNORMAL LOW (ref 12.0–15.0)
Immature Granulocytes: 1 %
Lymphocytes Relative: 29 %
Lymphs Abs: 1.2 10*3/uL (ref 0.7–4.0)
MCH: 30.8 pg (ref 26.0–34.0)
MCHC: 32.7 g/dL (ref 30.0–36.0)
MCV: 94.1 fL (ref 80.0–100.0)
Monocytes Absolute: 0.3 10*3/uL (ref 0.1–1.0)
Monocytes Relative: 9 %
Neutro Abs: 2.2 10*3/uL (ref 1.7–7.7)
Neutrophils Relative %: 55 %
Platelet Count: 305 10*3/uL (ref 150–400)
RBC: 3.41 MIL/uL — ABNORMAL LOW (ref 3.87–5.11)
RDW: 13.4 % (ref 11.5–15.5)
WBC Count: 4 10*3/uL (ref 4.0–10.5)
nRBC: 0 % (ref 0.0–0.2)

## 2020-07-08 LAB — CMP (CANCER CENTER ONLY)
ALT: 13 U/L (ref 0–44)
AST: 15 U/L (ref 15–41)
Albumin: 4.3 g/dL (ref 3.5–5.0)
Alkaline Phosphatase: 73 U/L (ref 38–126)
Anion gap: 8 (ref 5–15)
BUN: 15 mg/dL (ref 6–20)
CO2: 29 mmol/L (ref 22–32)
Calcium: 9.8 mg/dL (ref 8.9–10.3)
Chloride: 105 mmol/L (ref 98–111)
Creatinine: 0.66 mg/dL (ref 0.44–1.00)
GFR, Estimated: >60 mL/min (ref >60)
Glucose, Bld: 101 mg/dL — ABNORMAL HIGH (ref 70–99)
Potassium: 4 mmol/L (ref 3.5–5.1)
Sodium: 142 mmol/L (ref 135–145)
Total Bilirubin: 0.3 mg/dL (ref 0.3–1.2)
Total Protein: 6.4 g/dL — ABNORMAL LOW (ref 6.5–8.1)

## 2020-07-08 LAB — LACTATE DEHYDROGENASE: LDH: 161 U/L (ref 98–192)

## 2020-07-08 MED ORDER — SODIUM CHLORIDE 0.9% FLUSH
10.0000 mL | Freq: Once | INTRAVENOUS | Status: AC
  Administered 2020-07-08: 10 mL via INTRAVENOUS
  Filled 2020-07-08: qty 10

## 2020-07-08 MED ORDER — HEPARIN SOD (PORK) LOCK FLUSH 100 UNIT/ML IV SOLN
500.0000 [IU] | Freq: Once | INTRAVENOUS | Status: AC
  Administered 2020-07-08: 500 [IU] via INTRAVENOUS
  Filled 2020-07-08: qty 5

## 2020-07-08 NOTE — Patient Instructions (Signed)

## 2020-07-08 NOTE — Progress Notes (Signed)
Hematology and Oncology Follow Up Visit  Sherry Williams [id number] [birth year] 60 y.o. 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 is back for follow-up.  As always, she looks fantastic.  She comes in with her husband.  It is always a lot of fun to talk with both of them.  They are both incredibly eloquent and very educated.  She is heading out to [place] on Saturday.  She and her husband are driving out there.  She goes to a special clinic in [place].  She gets IV vitamin C.  In addition, she gets some other treatments out there.  1 problem that we have is trying to get her treatments through the Port-A-Cath.  I am not sure how we will be able to do this.  I think she gets the vitamin C 3 times a week out there.  Apparently, she is supposed to have Vitamin C in town twice a week.  The real problem again is a the Port-A-Cath needs to be accessed.  Our nurses are trying to figure out how this can be accessed.  She apparently developed some pain in the right inguinal region.  I think this was when she was walking.  She knows that she had an area of ecchymoses on the inner right thigh.  She is on Arixtra.  She is doing Arixtra daily.  I took a look at the inner thigh.  She has a fairly significant hematoma there.  I do think this is affecting muscle function.  She has good warmth.  When we last looked at her CA-125, it was still normal at 10.2.  We talked about using maintenance therapy with olaparib.  She just is not comfortable doing this.  I understand.  I think we can hold off on this for right now.  She did have a nice Christmas and New Year's.  She was with her family and she enjoyed  this.  She has had no problems with cough or shortness of breath..  She apparently had the COVID right around the holidays.  Both she and her husband had this.  She was not hospitalized.  She has recovered quite nicely.  She has had no fever.  There is no obvious change in bowel bladder habits.  Overall, I would say her performance status is ECOG 1.   Medications:  Current Outpatient Medications:  .  amoxicillin (AMOXIL) 500 MG capsule, Take 500 mg by mouth 3 (three) times daily., Disp: , Rfl:  .  ASPIRIN 81 PO, Take by mouth daily., Disp: , Rfl:  .  atorvastatin (LIPITOR) 40 MG tablet, , Disp: , Rfl:  .  dexamethasone (DECADRON) 4 MG tablet, Take 2 tablets (8 mg total) by mouth daily. Start the day after carboplatin chemotherapy for 3 days., Disp: 30 tablet, Rfl: 1 .  fondaparinux (ARIXTRA) 7.5 MG/0.6ML SOLN injection, ADMINISTER 0.6 ML(7.5 MG) UNDER THE SKIN DAILY, Disp: 18 mL, Rfl: 6 .  itraconazole (SPORANOX) 100 MG capsule, Take 100 mg by mouth 2 (two) times daily., Disp: , Rfl:  .  lidocaine-prilocaine (EMLA) cream, Apply to affected area once, Disp: 30 g, Rfl: 3 .  LORazepam (ATIVAN) 0.5 MG tablet, Take 1  tablet (0.5 mg total) by mouth every 6 (six) hours as needed (Nausea or vomiting). (Patient not taking: Reported on 03/16/2020), Disp: 30 tablet, Rfl: 0 .  ondansetron (ZOFRAN) 8 MG tablet, TAKE 1 TABLET BY MOUTH TWICE DAILY AS NEEDED FOR REFRACTORY NAUSEA/ VOMITING. START ON DAY 3 AFTER CARBOPLATIN CHEMO, Disp: 30 tablet, Rfl: 1 .  prochlorperazine (COMPAZINE) 10 MG tablet, TAKE 1 TABLET(10 MG) BY MOUTH EVERY 6 HOURS AS NEEDED FOR NAUSEA OR VOMITING, Disp: 30 tablet, Rfl: 1 .  thyroid (ARMOUR THYROID) 60 MG tablet, TAKE 1 TABLET BY MOUTH EVERY DAY BEFORE BREAKFAST. TAKE 1 DAILY 5 DAYS PER WEEK, Disp: 90 tablet, Rfl: 2 .  thyroid (ARMOUR THYROID) 90 MG tablet, TAKE 1 TABLET BY MOUTH EVERY DAY TWO DAYS PER WEEK, Disp: 90 tablet, Rfl: 1  Allergies:  Allergies  Allergen Reactions  . Bee  Venom Anaphylaxis and Swelling  . Adhesive [Tape] Other (See Comments)    Irritation and red  . Aspartame Diarrhea  . Aspartame And Phenylalanine Diarrhea    Past Medical History, Surgical history, Social history, and Family History were reviewed and updated.  Review of Systems: Review of Systems  Constitutional: Negative.   HENT: Negative.   Eyes: Negative.   Respiratory: Negative.   Cardiovascular: Negative.   Gastrointestinal: Positive for abdominal pain.  Genitourinary: Negative.   Musculoskeletal: Negative.   Skin: Negative.   Neurological: Negative.   Endo/Heme/Allergies: Negative.   Psychiatric/Behavioral: Negative.     Physical Exam:  weight is 125 lb (56.7 kg). Her oral temperature is 98.8 F (37.1 C). Her blood pressure is 104/57 (abnormal) and her pulse is 63. Her respiration is 17 and oxygen saturation is 100%.   Wt Readings from Last 3 Encounters:  07/08/20 125 lb (56.7 kg)  05/11/20 122 lb 8 oz (55.6 kg)  04/12/20 124 lb (56.2 kg)    Physical Exam Vitals reviewed.  HENT:     Head: Normocephalic and atraumatic.  Eyes:     Pupils: Pupils are equal, round, and reactive to light.  Cardiovascular:     Rate and Rhythm: Normal rate and regular rhythm.     Heart sounds: Normal heart sounds.  Pulmonary:     Effort: Pulmonary effort is normal.     Breath sounds: Normal breath sounds.  Abdominal:     General: Bowel sounds are normal.     Palpations: Abdomen is soft.     Comments: Abdominal exam shows a soft abdomen.  She has well healing laparoscopic scars.  I think there are 5 laparoscopic scars.  She has no erythema or exudate or warmth associated with these.  She has no fluid wave in the abdomen.  There is no guarding or rebound tenderness.  She has no palpable liver or spleen tip.  Musculoskeletal:        General: No tenderness or deformity. Normal range of motion.     Cervical back: Normal range of motion.  Lymphadenopathy:     Cervical: No cervical  adenopathy.  Skin:    General: Skin is warm and dry.     Findings: No erythema or rash.  Neurological:     Mental Status: She is alert and oriented to person, place, and time.  Psychiatric:        Behavior: Behavior normal.        Thought Content: Thought content normal.        Judgment: Judgment normal.      Lab Results  Component Value Date  WBC 4.0 07/08/2020   HGB 10.5 (L) 07/08/2020   HCT 32.1 (L) 07/08/2020   MCV 94.1 07/08/2020   PLT 305 07/08/2020     Chemistry      Component Value Date/Time   NA 142 07/08/2020 1050   NA 142 02/15/2017 1518   NA 140 04/13/2016 1055   K 4.0 07/08/2020 1050   K 4.1 02/15/2017 1518   K 4.1 04/13/2016 1055   CL 105 07/08/2020 1050   CL 107 02/15/2017 1518   CO2 29 07/08/2020 1050   CO2 29 02/15/2017 1518   CO2 27 04/13/2016 1055   BUN 15 07/08/2020 1050   BUN 17 02/15/2017 1518   BUN 22.1 04/13/2016 1055   CREATININE 0.66 07/08/2020 1050   CREATININE 1.0 02/15/2017 1518   CREATININE 0.8 04/13/2016 1055      Component Value Date/Time   CALCIUM 9.8 07/08/2020 1050   CALCIUM 9.3 02/15/2017 1518   CALCIUM 9.9 04/13/2016 1055   ALKPHOS 73 07/08/2020 1050   ALKPHOS 79 02/15/2017 1518   ALKPHOS 81 04/13/2016 1055   AST 15 07/08/2020 1050   AST 20 04/13/2016 1055   ALT 13 07/08/2020 1050   ALT 24 02/15/2017 1518   ALT 22 04/13/2016 1055   BILITOT 0.3 07/08/2020 1050   BILITOT 0.44 04/13/2016 1055      Impression and Plan: Ms. Fehr is a 60 year old Caucasian female.  She has had issues with recurrence of thromboembolic events.  She has had these systemically and in the brain.  We then found that she had relapse of the uterine cancer.  She has had systemic chemotherapy for this.  She has gone into remission.  We will go ahead with another PET scan on her.  I think that we have to follow her closely as she does not wish to have any maintenance therapy.  She certainly is at risk for recurrence.  I still think that the  "key" to this tumor is the fact that it is BRCA2 positive.  I know she had the genetic test for BRCA and was negative.  As such this is a somatic mutation that is in the cancer.   We did 4 cycles of treatment.  She really did not want the full 6 cycles.  I hope that all goes well with her out in [place].  I know that she has been followed out there closely.  I am sure that she is getting very good care out there and she certainly feels confident with her care.  We will keep her Port-A-Cath in.  Again, I believe it is a policy of the [facility] that the Port-A-Cath cannot be accessed by someone not certified for a Port-A-Cath use.  We do not do a vitamin C in the office so we certainly cannot access the Port-A-Cath for that.  Again, I think our nurses are trying to work hard with her to find out how the Port-A-Cath can be accessed for her Vitamin C use since she has very minimal peripheral access.  She will be back from [place] in a couple weeks.  I guess she will spend her birthday out in [place].  I will plan for the PET scan the week she gets back.  I will then see her back afterwards.    [name], MD 1/27/20224:45 PM

## 2020-07-08 NOTE — Addendum Note (Signed)
Addended by: Amelia Jo I on: 07/08/2020 01:18 PM   Modules accepted: Orders

## 2020-07-09 ENCOUNTER — Encounter: Payer: Self-pay | Admitting: *Deleted

## 2020-07-09 ENCOUNTER — Telehealth: Payer: Self-pay

## 2020-07-09 LAB — CA 125: Cancer Antigen (CA) 125: 6.8 U/mL (ref 0.0–38.1)

## 2020-07-09 NOTE — Telephone Encounter (Signed)
S/w pt per 07/08/20 los and she is aware of her appts   Sherry Williams

## 2020-07-30 ENCOUNTER — Ambulatory Visit (HOSPITAL_COMMUNITY): Payer: 59

## 2020-07-30 ENCOUNTER — Telehealth: Payer: Self-pay | Admitting: *Deleted

## 2020-07-30 NOTE — Telephone Encounter (Signed)
Patient called because she has an appt next week and has [place].  Concerned if allowed to come to appt.  Told patient it is ok to come to appt as long as no symptoms or Fever.  LMAM on personal voicemail.

## 2020-08-02 ENCOUNTER — Telehealth: Payer: Self-pay

## 2020-08-02 ENCOUNTER — Other Ambulatory Visit: Payer: Self-pay

## 2020-08-02 ENCOUNTER — Encounter (HOSPITAL_COMMUNITY)
Admission: RE | Admit: 2020-08-02 | Discharge: 2020-08-02 | Disposition: A | Discharge status: Home / Self Care | Payer: 59 | Source: Ambulatory Visit | Attending: Hematology & Oncology | Admitting: Hematology & Oncology

## 2020-08-02 DIAGNOSIS — C541 Malignant neoplasm of endometrium: Secondary | ICD-10-CM | POA: Diagnosis present

## 2020-08-02 LAB — GLUCOSE, CAPILLARY: Glucose-Capillary: 71 mg/dL (ref 70–99)

## 2020-08-02 MED ORDER — FLUDEOXYGLUCOSE F - 18 (FDG) INJECTION
7.0000 | Freq: Once | INTRAVENOUS | Status: AC | PRN
  Administered 2020-08-02: 6.1 via INTRAVENOUS

## 2020-08-02 NOTE — Telephone Encounter (Signed)
Called and informed patient about her PET scan results. Patient apprectiate and verbalized understanding

## 2020-08-02 NOTE — Telephone Encounter (Signed)
-----   Message from [name], MD sent at 08/02/2020 11:21 AM EST ----- Call - the PET scan looks fantastic!!  No obvious active cancer!!  Sherry Williams

## 2020-08-03 ENCOUNTER — Inpatient Hospital Stay (HOSPITAL_BASED_OUTPATIENT_CLINIC_OR_DEPARTMENT_OTHER): Payer: 59 | Admitting: Hematology & Oncology

## 2020-08-03 ENCOUNTER — Inpatient Hospital Stay: Payer: 59 | Attending: Hematology & Oncology

## 2020-08-03 ENCOUNTER — Other Ambulatory Visit: Payer: Self-pay

## 2020-08-03 ENCOUNTER — Other Ambulatory Visit: Payer: Self-pay | Admitting: Hematology & Oncology

## 2020-08-03 ENCOUNTER — Inpatient Hospital Stay: Payer: 59

## 2020-08-03 ENCOUNTER — Encounter: Payer: Self-pay | Admitting: Hematology & Oncology

## 2020-08-03 VITALS — BP 104/71 | HR 62 | Temp 98.0°F | Resp 18 | Wt 124.5 lb

## 2020-08-03 DIAGNOSIS — Z7901 Long term (current) use of anticoagulants: Secondary | ICD-10-CM | POA: Diagnosis not present

## 2020-08-03 DIAGNOSIS — Z8542 Personal history of malignant neoplasm of other parts of uterus: Secondary | ICD-10-CM | POA: Insufficient documentation

## 2020-08-03 DIAGNOSIS — C541 Malignant neoplasm of endometrium: Secondary | ICD-10-CM

## 2020-08-03 DIAGNOSIS — Z9221 Personal history of antineoplastic chemotherapy: Secondary | ICD-10-CM | POA: Diagnosis not present

## 2020-08-03 DIAGNOSIS — Z86718 Personal history of other venous thrombosis and embolism: Secondary | ICD-10-CM | POA: Insufficient documentation

## 2020-08-03 DIAGNOSIS — I82402 Acute embolism and thrombosis of unspecified deep veins of left lower extremity: Secondary | ICD-10-CM

## 2020-08-03 LAB — CBC WITH DIFFERENTIAL (CANCER CENTER ONLY)
Abs Immature Granulocytes: 0.01 10*3/uL (ref 0.00–0.07)
Basophils Absolute: 0 10*3/uL (ref 0.0–0.1)
Basophils Relative: 1 %
Eosinophils Absolute: 0.2 10*3/uL (ref 0.0–0.5)
Eosinophils Relative: 6 %
HCT: 37 % (ref 36.0–46.0)
Hemoglobin: 11.6 g/dL — ABNORMAL LOW (ref 12.0–15.0)
Immature Granulocytes: 0 %
Lymphocytes Relative: 30 %
Lymphs Abs: 1.1 10*3/uL (ref 0.7–4.0)
MCH: 29.7 pg (ref 26.0–34.0)
MCHC: 31.4 g/dL (ref 30.0–36.0)
MCV: 94.9 fL (ref 80.0–100.0)
Monocytes Absolute: 0.4 10*3/uL (ref 0.1–1.0)
Monocytes Relative: 11 %
Neutro Abs: 1.9 10*3/uL (ref 1.7–7.7)
Neutrophils Relative %: 52 %
Platelet Count: 271 10*3/uL (ref 150–400)
RBC: 3.9 MIL/uL (ref 3.87–5.11)
RDW: 13.7 % (ref 11.5–15.5)
WBC Count: 3.7 10*3/uL — ABNORMAL LOW (ref 4.0–10.5)
nRBC: 0 % (ref 0.0–0.2)

## 2020-08-03 LAB — CMP (CANCER CENTER ONLY)
ALT: 32 U/L (ref 0–44)
AST: 25 U/L (ref 15–41)
Albumin: 4.9 g/dL (ref 3.5–5.0)
Alkaline Phosphatase: 80 U/L (ref 38–126)
Anion gap: 9 (ref 5–15)
BUN: 22 mg/dL (ref 8–23)
CO2: 28 mmol/L (ref 22–32)
Calcium: 10.1 mg/dL (ref 8.9–10.3)
Chloride: 103 mmol/L (ref 98–111)
Creatinine: 0.73 mg/dL (ref 0.44–1.00)
GFR, Estimated: >60 mL/min (ref >60)
Glucose, Bld: 82 mg/dL (ref 70–99)
Potassium: 4.5 mmol/L (ref 3.5–5.1)
Sodium: 140 mmol/L (ref 135–145)
Total Bilirubin: 0.3 mg/dL (ref 0.3–1.2)
Total Protein: 7 g/dL (ref 6.5–8.1)

## 2020-08-03 LAB — D-DIMER, QUANTITATIVE: D-Dimer, Quant: 0.5 ug/mL-FEU (ref 0.00–0.50)

## 2020-08-03 LAB — LACTATE DEHYDROGENASE: LDH: 159 U/L (ref 98–192)

## 2020-08-03 NOTE — Addendum Note (Signed)
Addended by: [name] on: 08/03/2020 01:52 PM   Modules accepted: Orders

## 2020-08-03 NOTE — Progress Notes (Signed)
Hematology and Oncology Follow Up Visit  Sherry Williams [id number] Jul 26, 1960 60 y.o. 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  I would say that she really looks fantastic.  She is seen her hair grow back which is nice.  She and her husband were out in [place].  They read a complementary therapy clinic.  She feels that this really helps her.  She I think received vitamin therapy and high-dose vitamin C.  I think she may have also gotten some ozone therapy.  Again she enjoyed going out there.  She feels that it does definitely help with respect to the cancer.  We did do a PET scan on her.  This was done a couple days ago.  The PET scan did not show any evidence of active cancer.  Her last CA-125 was down to 6.8.  Her appetite is good.  She has had no nausea or vomiting.  There is been no change in bowel or bladder habits.  She is on Arixtra at 7.5 mg.  This is for her recurrent thromboembolic disease.  I think that we might be able to decrease her dose down to 5 mg in the near future.  I think given the fact that the PET scan looks fine, I think that her risk of thromboembolic disease is decreased.  I think she is okay with this.  She says that she is bruising a lot more now.  She has had no cough.  There is no shortness of breath.  She has had no fever.  Overall, I would say performance status is ECOG 1.  Medications:  Current Outpatient Medications:  .  ASPIRIN 81 PO, Take by mouth daily., Disp: , Rfl:  .  atorvastatin (LIPITOR) 40 MG tablet, , Disp: , Rfl:  .  dexamethasone (DECADRON) 4 MG tablet, Take 2 tablets (8 mg total) by mouth daily. Start the day  after carboplatin chemotherapy for 3 days., Disp: 30 tablet, Rfl: 1 .  itraconazole (SPORANOX) 100 MG capsule, Take 100 mg by mouth 2 (two) times daily., Disp: , Rfl:  .  lidocaine-prilocaine (EMLA) cream, Apply to affected area once, Disp: 30 g, Rfl: 3 .  ondansetron (ZOFRAN) 8 MG tablet, TAKE 1 TABLET BY MOUTH TWICE DAILY AS NEEDED FOR REFRACTORY NAUSEA/ VOMITING. START ON DAY 3 AFTER CARBOPLATIN CHEMO, Disp: 30 tablet, Rfl: 1 .  prochlorperazine (COMPAZINE) 10 MG tablet, TAKE 1 TABLET(10 MG) BY MOUTH EVERY 6 HOURS AS NEEDED FOR NAUSEA OR VOMITING, Disp: 30 tablet, Rfl: 1 .  thyroid (ARMOUR THYROID) 60 MG tablet, TAKE 1 TABLET BY MOUTH EVERY DAY BEFORE BREAKFAST. TAKE 1 DAILY 5 DAYS PER WEEK, Disp: 90 tablet, Rfl: 2 .  thyroid (ARMOUR THYROID) 90 MG tablet, TAKE 1 TABLET BY MOUTH EVERY DAY TWO DAYS PER WEEK, Disp: 90 tablet, Rfl: 1 .  amoxicillin (AMOXIL) 500 MG capsule, Take 500 mg by mouth 3 (three) times daily., Disp: , Rfl:  .  fondaparinux (ARIXTRA) 7.5 MG/0.6ML SOLN injection, ADMINISTER 0.6 ML(7.5 MG) UNDER THE SKIN DAILY, Disp: 30 mL, Rfl: 2 .  LORazepam (ATIVAN) 0.5 MG tablet, Take 1 tablet (0.5 mg total) by mouth every 6 (  six) hours as needed (Nausea or vomiting). (Patient not taking: Reported on 03/16/2020), Disp: 30 tablet, Rfl: 0  Allergies:  Allergies  Allergen Reactions  . Bee Venom Anaphylaxis and Swelling  . Adhesive [Tape] Other (See Comments)    Irritation and red  . Aspartame Diarrhea  . Aspartame And Phenylalanine Diarrhea    Past Medical History, Surgical history, Social history, and Family History were reviewed and updated.  Review of Systems: Review of Systems  Constitutional: Negative.   HENT: Negative.   Eyes: Negative.   Respiratory: Negative.   Cardiovascular: Negative.   Gastrointestinal: Positive for abdominal pain.  Genitourinary: Negative.   Musculoskeletal: Negative.   Skin: Negative.   Neurological: Negative.   Endo/Heme/Allergies: Negative.    Psychiatric/Behavioral: Negative.     Physical Exam:  weight is 124 lb 8 oz (56.5 kg). Her oral temperature is 98 F (36.7 C). Her blood pressure is 104/71 and her pulse is 62. Her respiration is 18 and oxygen saturation is 100%.   Wt Readings from Last 3 Encounters:  08/03/20 124 lb 8 oz (56.5 kg)  07/08/20 125 lb (56.7 kg)  05/11/20 122 lb 8 oz (55.6 kg)    Physical Exam Vitals reviewed.  HENT:     Head: Normocephalic and atraumatic.  Eyes:     Pupils: Pupils are equal, round, and reactive to light.  Cardiovascular:     Rate and Rhythm: Normal rate and regular rhythm.     Heart sounds: Normal heart sounds.  Pulmonary:     Effort: Pulmonary effort is normal.     Breath sounds: Normal breath sounds.  Abdominal:     General: Bowel sounds are normal.     Palpations: Abdomen is soft.     Comments: Abdominal exam shows a soft abdomen.  She has well healing laparoscopic scars.  I think there are 5 laparoscopic scars.  She has no erythema or exudate or warmth associated with these.  She has no fluid wave in the abdomen.  There is no guarding or rebound tenderness.  She has no palpable liver or spleen tip.  Musculoskeletal:        General: No tenderness or deformity. Normal range of motion.     Cervical back: Normal range of motion.  Lymphadenopathy:     Cervical: No cervical adenopathy.  Skin:    General: Skin is warm and dry.     Findings: No erythema or rash.  Neurological:     Mental Status: She is alert and oriented to person, place, and time.  Psychiatric:        Behavior: Behavior normal.        Thought Content: Thought content normal.        Judgment: Judgment normal.      Lab Results  Component Value Date   WBC 3.7 (L) 08/03/2020   HGB 11.6 (L) 08/03/2020   HCT 37.0 08/03/2020   MCV 94.9 08/03/2020   PLT 271 08/03/2020     Chemistry      Component Value Date/Time   NA 140 08/03/2020 1037   NA 142 02/15/2017 1518   NA 140 04/13/2016 1055   K 4.5  08/03/2020 1037   K 4.1 02/15/2017 1518   K 4.1 04/13/2016 1055   CL 103 08/03/2020 1037   CL 107 02/15/2017 1518   CO2 28 08/03/2020 1037   CO2 29 02/15/2017 1518   CO2 27 04/13/2016 1055   BUN 22 08/03/2020 1037   BUN 17 02/15/2017 1518  BUN 22.1 04/13/2016 1055   CREATININE 0.73 08/03/2020 1037   CREATININE 1.0 02/15/2017 1518   CREATININE 0.8 04/13/2016 1055      Component Value Date/Time   CALCIUM 10.1 08/03/2020 1037   CALCIUM 9.3 02/15/2017 1518   CALCIUM 9.9 04/13/2016 1055   ALKPHOS 80 08/03/2020 1037   ALKPHOS 79 02/15/2017 1518   ALKPHOS 81 04/13/2016 1055   AST 25 08/03/2020 1037   AST 20 04/13/2016 1055   ALT 32 08/03/2020 1037   ALT 24 02/15/2017 1518   ALT 22 04/13/2016 1055   BILITOT 0.3 08/03/2020 1037   BILITOT 0.44 04/13/2016 1055      Impression and Plan: Ms. Kreischer is a 60 year old Caucasian female.  She has had issues with recurrence of thromboembolic events.  She has had these systemically and in the brain.  We then found that she had relapse of the uterine cancer.  She has had systemic chemotherapy for this.  She has gone into remission.  I am so happy that the PET scan not show any evidence of active disease.  As such, I do not think we have to do another PET scan on her for about 3 months.  This is all about quality of life.  Her quality of life is improving.  I am happy about this.  Again, when we see her back, we will see about decreasing the Arixtra dose down to 5 mg.  She will come back in 6 weeks.  I think this would be very reasonable for her.  She was knows that she can come back sooner if she has any problems.  She relates, long ways.  Is been about a year now that she had the recurrent thromboembolic disease in the cerebrovascular issues.     [name], MD 2/22/20221:46 PM

## 2020-08-04 LAB — CA 125: Cancer Antigen (CA) 125: 8.7 U/mL (ref 0.0–38.1)

## 2020-08-17 ENCOUNTER — Encounter: Payer: Self-pay | Admitting: *Deleted

## 2020-09-14 ENCOUNTER — Telehealth: Payer: Self-pay | Admitting: *Deleted

## 2020-09-14 ENCOUNTER — Other Ambulatory Visit: Payer: 59

## 2020-09-14 ENCOUNTER — Ambulatory Visit: Payer: 59 | Admitting: Hematology & Oncology

## 2020-09-14 NOTE — Telephone Encounter (Signed)
Patient had her port accessed by outside medical facility last week, which was unsuccessful. Needle had to be withdrawn and reaccess completed. When the need was removed after second access, patient had significant bleeding from the site. Site was sore and tender. She had her port reaccessed today for additional treatment at same outside facility and she experienced pain at the port site, but the pain also spread across her chest. They removed the needle and cancelled the remainder of the infusion.   She calls concerned about the pain. She denies redness, swelling or heat at the site. She says there is nothing to suggest infection. We discussed that she likely had a traumatic access and that her pain is related to this and the increased bleeding. She had an appointment in this office on Friday where we can visually inspect the port. She is okay to wait until then, and has no appointments beforehand where the port will be accessed. She understands to call the office if the site shows any sign of infection.

## 2020-09-17 ENCOUNTER — Encounter: Payer: Self-pay | Admitting: Hematology & Oncology

## 2020-09-17 ENCOUNTER — Inpatient Hospital Stay: Payer: 59 | Attending: Hematology & Oncology

## 2020-09-17 ENCOUNTER — Other Ambulatory Visit: Payer: Self-pay

## 2020-09-17 ENCOUNTER — Telehealth: Payer: Self-pay

## 2020-09-17 ENCOUNTER — Inpatient Hospital Stay (HOSPITAL_BASED_OUTPATIENT_CLINIC_OR_DEPARTMENT_OTHER): Payer: 59 | Admitting: Hematology & Oncology

## 2020-09-17 VITALS — BP 107/75 | HR 60 | Temp 98.4°F | Resp 17 | Wt 128.2 lb

## 2020-09-17 DIAGNOSIS — C541 Malignant neoplasm of endometrium: Secondary | ICD-10-CM

## 2020-09-17 DIAGNOSIS — Z86718 Personal history of other venous thrombosis and embolism: Secondary | ICD-10-CM | POA: Insufficient documentation

## 2020-09-17 DIAGNOSIS — Z8673 Personal history of transient ischemic attack (TIA), and cerebral infarction without residual deficits: Secondary | ICD-10-CM | POA: Insufficient documentation

## 2020-09-17 DIAGNOSIS — Z7901 Long term (current) use of anticoagulants: Secondary | ICD-10-CM | POA: Diagnosis not present

## 2020-09-17 DIAGNOSIS — C55 Malignant neoplasm of uterus, part unspecified: Secondary | ICD-10-CM | POA: Diagnosis not present

## 2020-09-17 DIAGNOSIS — Z8542 Personal history of malignant neoplasm of other parts of uterus: Secondary | ICD-10-CM | POA: Diagnosis present

## 2020-09-17 DIAGNOSIS — I82402 Acute embolism and thrombosis of unspecified deep veins of left lower extremity: Secondary | ICD-10-CM

## 2020-09-17 LAB — CBC WITH DIFFERENTIAL (CANCER CENTER ONLY)
Abs Immature Granulocytes: 0.01 10*3/uL (ref 0.00–0.07)
Basophils Absolute: 0 10*3/uL (ref 0.0–0.1)
Basophils Relative: 1 %
Eosinophils Absolute: 0.1 10*3/uL (ref 0.0–0.5)
Eosinophils Relative: 2 %
HCT: 37.6 % (ref 36.0–46.0)
Hemoglobin: 12 g/dL (ref 12.0–15.0)
Immature Granulocytes: 0 %
Lymphocytes Relative: 27 %
Lymphs Abs: 1.2 10*3/uL (ref 0.7–4.0)
MCH: 29.9 pg (ref 26.0–34.0)
MCHC: 31.9 g/dL (ref 30.0–36.0)
MCV: 93.5 fL (ref 80.0–100.0)
Monocytes Absolute: 0.4 10*3/uL (ref 0.1–1.0)
Monocytes Relative: 9 %
Neutro Abs: 2.7 10*3/uL (ref 1.7–7.7)
Neutrophils Relative %: 61 %
Platelet Count: 224 10*3/uL (ref 150–400)
RBC: 4.02 MIL/uL (ref 3.87–5.11)
RDW: 13.5 % (ref 11.5–15.5)
WBC Count: 4.3 10*3/uL (ref 4.0–10.5)
nRBC: 0 % (ref 0.0–0.2)

## 2020-09-17 LAB — D-DIMER, QUANTITATIVE: D-Dimer, Quant: 0.33 ug/mL-FEU (ref 0.00–0.50)

## 2020-09-17 LAB — CMP (CANCER CENTER ONLY)
ALT: 15 U/L (ref 0–44)
AST: 17 U/L (ref 15–41)
Albumin: 4.7 g/dL (ref 3.5–5.0)
Alkaline Phosphatase: 97 U/L (ref 38–126)
Anion gap: 8 (ref 5–15)
BUN: 16 mg/dL (ref 8–23)
CO2: 31 mmol/L (ref 22–32)
Calcium: 10.1 mg/dL (ref 8.9–10.3)
Chloride: 104 mmol/L (ref 98–111)
Creatinine: 0.76 mg/dL (ref 0.44–1.00)
GFR, Estimated: >60 mL/min (ref >60)
Glucose, Bld: 106 mg/dL — ABNORMAL HIGH (ref 70–99)
Potassium: 4.2 mmol/L (ref 3.5–5.1)
Sodium: 143 mmol/L (ref 135–145)
Total Bilirubin: 0.4 mg/dL (ref 0.3–1.2)
Total Protein: 6.7 g/dL (ref 6.5–8.1)

## 2020-09-17 LAB — LACTATE DEHYDROGENASE: LDH: 156 U/L (ref 98–192)

## 2020-09-17 MED ORDER — FONDAPARINUX SODIUM 5 MG/0.4ML ~~LOC~~ SOLN: 5.0000 mg | SUBCUTANEOUS | 5 refills | Status: DC

## 2020-09-17 NOTE — Progress Notes (Signed)
Hematology and Oncology Follow Up Visit  Sherry Williams [id number] Jun 01, 1961 60 y.o. 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  She is doing pretty well.  She comes in with her husband.  The one problem that she might have is the Port-A-Cath site does not look all that good.  There is a lot of swelling there.  She is on Arixtra which might be part of the swelling.  She says that when she was getting her vitamin C therapy that she had a nurse that was learning how to access Port-A-Cath and there may have been an issue with respect to accessing it.  She had some pain.  She has some bleeding.  Otherwise, she seems to be managing quite nicely.  She is walking.  She is doing some running.  She is much more active now.  She would like to see about decreasing the dose of Arixtra.  I think that we could probably do this.  I do not see any issues with respect to active thromboembolic disease.  We will decrease the Arixtra dose down to 5 mg a day.  She has had no change in bowel or bladder habits.  She has had no nausea or vomiting.  There is been no cough or shortness of breath.  She has had no problems with leg swelling.  There might be a little bit of chronic ankle swelling with the right ankle.  I am just happy that she is doing so nicely.  Her last CA-125 was holding steady at 8.7.  Currently, her performance status is ECOG 0.   Medications:  Current Outpatient Medications:  .  amoxicillin (AMOXIL) 500 MG capsule, Take 500 mg by mouth 3 (three) times daily., Disp: , Rfl:  .  ASPIRIN 81 PO, Take by mouth daily., Disp: , Rfl:  .  atorvastatin  (LIPITOR) 40 MG tablet, , Disp: , Rfl:  .  dexamethasone (DECADRON) 4 MG tablet, Take 2 tablets (8 mg total) by mouth daily. Start the day after carboplatin chemotherapy for 3 days., Disp: 30 tablet, Rfl: 1 .  fondaparinux (ARIXTRA) 7.5 MG/0.6ML SOLN injection, ADMINISTER 0.6 ML(7.5 MG) UNDER THE SKIN DAILY, Disp: 30 mL, Rfl: 2 .  itraconazole (SPORANOX) 100 MG capsule, Take 100 mg by mouth 2 (two) times daily., Disp: , Rfl:  .  lidocaine-prilocaine (EMLA) cream, Apply to affected area once, Disp: 30 g, Rfl: 3 .  LORazepam (ATIVAN) 0.5 MG tablet, Take 1 tablet (0.5 mg total) by mouth every 6 (six) hours as needed (Nausea or vomiting). (Patient not taking: Reported on 03/16/2020), Disp: 30 tablet, Rfl: 0 .  ondansetron (ZOFRAN) 8 MG tablet, TAKE 1 TABLET BY MOUTH TWICE DAILY AS NEEDED FOR REFRACTORY NAUSEA/ VOMITING. START ON DAY 3 AFTER CARBOPLATIN CHEMO, Disp: 30 tablet, Rfl: 1 .  prochlorperazine (COMPAZINE) 10 MG tablet, TAKE 1 TABLET(10 MG) BY MOUTH EVERY 6 HOURS AS NEEDED FOR NAUSEA OR VOMITING, Disp: 30 tablet, Rfl: 1 .  thyroid (ARMOUR THYROID) 60 MG tablet, TAKE 1 TABLET BY MOUTH EVERY DAY BEFORE BREAKFAST. TAKE 1 DAILY 5 DAYS PER WEEK, Disp: 90 tablet, Rfl: 2 .  thyroid (  ARMOUR THYROID) 90 MG tablet, TAKE 1 TABLET BY MOUTH EVERY DAY TWO DAYS PER WEEK, Disp: 90 tablet, Rfl: 1  Allergies:  Allergies  Allergen Reactions  . Bee Venom Anaphylaxis and Swelling  . Adhesive [Tape] Other (See Comments)    Irritation and red  . Aspartame Diarrhea  . Aspartame And Phenylalanine Diarrhea    Past Medical History, Surgical history, Social history, and Family History were reviewed and updated.  Review of Systems: Review of Systems  Constitutional: Negative.   HENT: Negative.   Eyes: Negative.   Respiratory: Negative.   Cardiovascular: Negative.   Gastrointestinal: Positive for abdominal pain.  Genitourinary: Negative.   Musculoskeletal: Negative.   Skin: Negative.   Neurological: Negative.    Endo/Heme/Allergies: Negative.   Psychiatric/Behavioral: Negative.     Physical Exam:  weight is 128 lb 4 oz (58.2 kg). Her oral temperature is 98.4 F (36.9 C). Her blood pressure is 107/75 and her pulse is 60. Her respiration is 17 and oxygen saturation is 100%.   Wt Readings from Last 3 Encounters:  09/17/20 128 lb 4 oz (58.2 kg)  08/03/20 124 lb 8 oz (56.5 kg)  07/08/20 125 lb (56.7 kg)    Physical Exam Vitals reviewed.  HENT:     Head: Normocephalic and atraumatic.  Eyes:     Pupils: Pupils are equal, round, and reactive to light.  Cardiovascular:     Rate and Rhythm: Normal rate and regular rhythm.     Heart sounds: Normal heart sounds.  Pulmonary:     Effort: Pulmonary effort is normal.     Breath sounds: Normal breath sounds.  Abdominal:     General: Bowel sounds are normal.     Palpations: Abdomen is soft.     Comments: Abdominal exam shows a soft abdomen.  She has well healing laparoscopic scars.  I think there are 5 laparoscopic scars.  She has no erythema or exudate or warmth associated with these.  She has no fluid wave in the abdomen.  There is no guarding or rebound tenderness.  She has no palpable liver or spleen tip.  Musculoskeletal:        General: No tenderness or deformity. Normal range of motion.     Cervical back: Normal range of motion.  Lymphadenopathy:     Cervical: No cervical adenopathy.  Skin:    General: Skin is warm and dry.     Findings: No erythema or rash.  Neurological:     Mental Status: She is alert and oriented to person, place, and time.  Psychiatric:        Behavior: Behavior normal.        Thought Content: Thought content normal.        Judgment: Judgment normal.      Lab Results  Component Value Date   WBC 4.3 09/17/2020   HGB 12.0 09/17/2020   HCT 37.6 09/17/2020   MCV 93.5 09/17/2020   PLT 224 09/17/2020     Chemistry      Component Value Date/Time   NA 143 09/17/2020 1211   NA 142 02/15/2017 1518   NA 140  04/13/2016 1055   K 4.2 09/17/2020 1211   K 4.1 02/15/2017 1518   K 4.1 04/13/2016 1055   CL 104 09/17/2020 1211   CL 107 02/15/2017 1518   CO2 31 09/17/2020 1211   CO2 29 02/15/2017 1518   CO2 27 04/13/2016 1055   BUN 16 09/17/2020 1211   BUN 17 02/15/2017 1518  BUN 22.1 04/13/2016 1055   CREATININE 0.76 09/17/2020 1211   CREATININE 1.0 02/15/2017 1518   CREATININE 0.8 04/13/2016 1055      Component Value Date/Time   CALCIUM 10.1 09/17/2020 1211   CALCIUM 9.3 02/15/2017 1518   CALCIUM 9.9 04/13/2016 1055   ALKPHOS 97 09/17/2020 1211   ALKPHOS 79 02/15/2017 1518   ALKPHOS 81 04/13/2016 1055   AST 17 09/17/2020 1211   AST 20 04/13/2016 1055   ALT 15 09/17/2020 1211   ALT 24 02/15/2017 1518   ALT 22 04/13/2016 1055   BILITOT 0.4 09/17/2020 1211   BILITOT 0.44 04/13/2016 1055      Impression and Plan: Ms. Sagun is a 60 year old Caucasian female.  She has had issues with recurrence of thromboembolic events.  She has had these systemically and in the brain.  We then found that she had relapse of the uterine cancer.  She has had systemic chemotherapy for this.  She has gone into remission.  I we will set her up with another PET scan before we see her back in May.  I think that we have to keep doing routine PET scans on her just because of the risk of recurrence.  We told her that she really cannot have the Port-A-Cath accessed for several weeks.  Again, I do not think there is any infection there.  I had been asked to take a look at the Port-A-Cath site.  I think a lot of the changes probably are from bleeding.  She understands quite well that she must have a certified nurse who knows how to access Port-A-Cath's.  We will plan to see her back in May.  She and her family are going to go on vacation I think in mid May.  We probably will get to see her back after she gets back.       [name], MD 4/8/20221:20 PM

## 2020-09-17 NOTE — Telephone Encounter (Signed)
Called and left a vm with f/u appt per 09/17/20 los    [employer]

## 2020-09-18 LAB — CA 125: Cancer Antigen (CA) 125: 10.2 U/mL (ref 0.0–38.1)

## 2020-09-20 ENCOUNTER — Encounter: Payer: Self-pay | Admitting: *Deleted

## 2020-09-21 ENCOUNTER — Telehealth: Payer: Self-pay

## 2020-09-21 ENCOUNTER — Telehealth: Payer: Self-pay | Admitting: *Deleted

## 2020-09-21 ENCOUNTER — Other Ambulatory Visit: Payer: Self-pay | Admitting: *Deleted

## 2020-09-21 ENCOUNTER — Encounter: Payer: Self-pay | Admitting: *Deleted

## 2020-09-21 DIAGNOSIS — C541 Malignant neoplasm of endometrium: Secondary | ICD-10-CM

## 2020-09-21 NOTE — Telephone Encounter (Signed)
Called and left a vm with new appt per pt as she will be out of town at her orig appt time   [place]

## 2020-09-21 NOTE — Telephone Encounter (Signed)
Patient c/o continued pain at port site. Pain is sharp and consistently present even without activity. She states the site is more purple, with some redness to the surrounding skin. The site is hot. She is unaware of any fever. Request was made for her to send a picture via MyChart and she agreed.  Picture reviewed with Dr Marin Olp. He would like IR to assess site as they were the ones to place port. He is concerned that port will need to be removed, but he would like them to determine if there is another possible course of action. Order placed and IR notified.   Patient notified of MD plan and that she should expect a call to schedule appointment with IR.

## 2020-09-22 ENCOUNTER — Ambulatory Visit (HOSPITAL_COMMUNITY)
Admission: RE | Admit: 2020-09-22 | Discharge: 2020-09-22 | Disposition: A | Discharge status: Home / Self Care | Payer: 59 | Source: Ambulatory Visit | Attending: Hematology & Oncology | Admitting: Hematology & Oncology

## 2020-09-22 ENCOUNTER — Other Ambulatory Visit: Payer: Self-pay | Admitting: Hematology & Oncology

## 2020-09-22 ENCOUNTER — Other Ambulatory Visit: Payer: Self-pay

## 2020-09-22 DIAGNOSIS — C541 Malignant neoplasm of endometrium: Secondary | ICD-10-CM | POA: Insufficient documentation

## 2020-09-22 HISTORY — PX: IR CV LINE INJECTION: IMG2294

## 2020-09-22 MED ORDER — IOHEXOL 300 MG/ML  SOLN: 50.0000 mL | Freq: Once | INTRAMUSCULAR | Status: DC | PRN

## 2020-09-22 MED ORDER — HEPARIN SOD (PORK) LOCK FLUSH 100 UNIT/ML IV SOLN
INTRAVENOUS | Status: AC
  Filled 2020-09-22: qty 5

## 2020-09-22 MED ORDER — LIDOCAINE-EPINEPHRINE 1 %-1:100000 IJ SOLN
INTRAMUSCULAR | Status: AC
  Filled 2020-09-22: qty 1

## 2020-09-22 NOTE — Procedures (Signed)
Interventional Radiology Procedure Note  Procedure: Port check  Findings: Please refer to procedural dictation for full description. Patent port reservoir and catheter without evidence of leak, fracture, or perforation.    Complications: None  Estimated Blood Loss: < 5 ml  Recommendations: Ecchymotic changes at port site likely related to small underlying hematoma which is likely now resolving. No evidence of leakage or port malfunction.  Clinically no evidence of infection.  OK to continue use of port as needed.  Agree with giving some time to let ecchymosis subside prior to continuing treatments.   Ruthann Cancer, MD Pager: [phone]

## 2020-09-23 ENCOUNTER — Encounter: Payer: Self-pay | Admitting: *Deleted

## 2020-10-19 ENCOUNTER — Encounter: Payer: Self-pay | Admitting: *Deleted

## 2020-10-20 ENCOUNTER — Other Ambulatory Visit: Payer: Self-pay | Admitting: *Deleted

## 2020-10-20 DIAGNOSIS — E063 Autoimmune thyroiditis: Secondary | ICD-10-CM

## 2020-10-20 DIAGNOSIS — C541 Malignant neoplasm of endometrium: Secondary | ICD-10-CM

## 2020-10-20 DIAGNOSIS — I82401 Acute embolism and thrombosis of unspecified deep veins of right lower extremity: Secondary | ICD-10-CM

## 2020-10-20 DIAGNOSIS — I63019 Cerebral infarction due to thrombosis of unspecified vertebral artery: Secondary | ICD-10-CM

## 2020-11-05 ENCOUNTER — Other Ambulatory Visit: Payer: 59

## 2020-11-05 ENCOUNTER — Ambulatory Visit: Payer: 59 | Admitting: Hematology & Oncology

## 2020-11-09 ENCOUNTER — Ambulatory Visit (HOSPITAL_COMMUNITY)
Admission: RE | Admit: 2020-11-09 | Discharge: 2020-11-09 | Disposition: A | Discharge status: Home / Self Care | Payer: 59 | Source: Ambulatory Visit | Attending: Hematology & Oncology | Admitting: Hematology & Oncology

## 2020-11-09 ENCOUNTER — Other Ambulatory Visit: Payer: Self-pay

## 2020-11-09 DIAGNOSIS — C541 Malignant neoplasm of endometrium: Secondary | ICD-10-CM | POA: Diagnosis present

## 2020-11-09 LAB — GLUCOSE, CAPILLARY: Glucose-Capillary: 76 mg/dL (ref 70–99)

## 2020-11-09 MED ORDER — FLUDEOXYGLUCOSE F - 18 (FDG) INJECTION
6.4200 | Freq: Once | INTRAVENOUS | Status: AC | PRN
  Administered 2020-11-09: 6.42 via INTRAVENOUS

## 2020-11-10 ENCOUNTER — Encounter: Payer: Self-pay | Admitting: Hematology & Oncology

## 2020-11-10 ENCOUNTER — Other Ambulatory Visit: Payer: Self-pay | Admitting: *Deleted

## 2020-11-10 ENCOUNTER — Inpatient Hospital Stay: Payer: 59

## 2020-11-10 ENCOUNTER — Inpatient Hospital Stay: Payer: 59 | Attending: Hematology & Oncology

## 2020-11-10 ENCOUNTER — Encounter: Payer: Self-pay | Admitting: *Deleted

## 2020-11-10 ENCOUNTER — Inpatient Hospital Stay (HOSPITAL_BASED_OUTPATIENT_CLINIC_OR_DEPARTMENT_OTHER): Payer: 59 | Admitting: Hematology & Oncology

## 2020-11-10 VITALS — Wt 129.0 lb

## 2020-11-10 VITALS — BP 113/58 | HR 60 | Temp 98.2°F | Resp 17

## 2020-11-10 DIAGNOSIS — C541 Malignant neoplasm of endometrium: Secondary | ICD-10-CM | POA: Diagnosis not present

## 2020-11-10 DIAGNOSIS — Z86718 Personal history of other venous thrombosis and embolism: Secondary | ICD-10-CM | POA: Insufficient documentation

## 2020-11-10 DIAGNOSIS — E063 Autoimmune thyroiditis: Secondary | ICD-10-CM

## 2020-11-10 DIAGNOSIS — I63219 Cerebral infarction due to unspecified occlusion or stenosis of unspecified vertebral arteries: Secondary | ICD-10-CM

## 2020-11-10 DIAGNOSIS — Z8673 Personal history of transient ischemic attack (TIA), and cerebral infarction without residual deficits: Secondary | ICD-10-CM | POA: Diagnosis not present

## 2020-11-10 DIAGNOSIS — I82401 Acute embolism and thrombosis of unspecified deep veins of right lower extremity: Secondary | ICD-10-CM

## 2020-11-10 DIAGNOSIS — Z9221 Personal history of antineoplastic chemotherapy: Secondary | ICD-10-CM | POA: Insufficient documentation

## 2020-11-10 DIAGNOSIS — I63019 Cerebral infarction due to thrombosis of unspecified vertebral artery: Secondary | ICD-10-CM

## 2020-11-10 DIAGNOSIS — Z7901 Long term (current) use of anticoagulants: Secondary | ICD-10-CM | POA: Insufficient documentation

## 2020-11-10 DIAGNOSIS — C55 Malignant neoplasm of uterus, part unspecified: Secondary | ICD-10-CM | POA: Diagnosis not present

## 2020-11-10 LAB — CMP (CANCER CENTER ONLY)
ALT: 17 U/L (ref 0–44)
AST: 16 U/L (ref 15–41)
Albumin: 4.2 g/dL (ref 3.5–5.0)
Alkaline Phosphatase: 79 U/L (ref 38–126)
Anion gap: 10 (ref 5–15)
BUN: 12 mg/dL (ref 8–23)
CO2: 27 mmol/L (ref 22–32)
Calcium: 9.2 mg/dL (ref 8.9–10.3)
Chloride: 104 mmol/L (ref 98–111)
Creatinine: 0.72 mg/dL (ref 0.44–1.00)
GFR, Estimated: >60 mL/min (ref >60)
Glucose, Bld: 158 mg/dL — ABNORMAL HIGH (ref 70–99)
Potassium: 3.8 mmol/L (ref 3.5–5.1)
Sodium: 141 mmol/L (ref 135–145)
Total Bilirubin: 0.4 mg/dL (ref 0.3–1.2)
Total Protein: 6 g/dL — ABNORMAL LOW (ref 6.5–8.1)

## 2020-11-10 LAB — CBC WITH DIFFERENTIAL (CANCER CENTER ONLY)
Abs Immature Granulocytes: 0.01 10*3/uL (ref 0.00–0.07)
Basophils Absolute: 0 10*3/uL (ref 0.0–0.1)
Basophils Relative: 0 %
Eosinophils Absolute: 0 10*3/uL (ref 0.0–0.5)
Eosinophils Relative: 1 %
HCT: 34.4 % — ABNORMAL LOW (ref 36.0–46.0)
Hemoglobin: 11.2 g/dL — ABNORMAL LOW (ref 12.0–15.0)
Immature Granulocytes: 0 %
Lymphocytes Relative: 28 %
Lymphs Abs: 0.8 10*3/uL (ref 0.7–4.0)
MCH: 29.7 pg (ref 26.0–34.0)
MCHC: 32.6 g/dL (ref 30.0–36.0)
MCV: 91.2 fL (ref 80.0–100.0)
Monocytes Absolute: 0.3 10*3/uL (ref 0.1–1.0)
Monocytes Relative: 8 %
Neutro Abs: 1.8 10*3/uL (ref 1.7–7.7)
Neutrophils Relative %: 63 %
Platelet Count: 215 10*3/uL (ref 150–400)
RBC: 3.77 MIL/uL — ABNORMAL LOW (ref 3.87–5.11)
RDW: 13.7 % (ref 11.5–15.5)
WBC Count: 3 10*3/uL — ABNORMAL LOW (ref 4.0–10.5)
nRBC: 0 % (ref 0.0–0.2)

## 2020-11-10 LAB — T4, FREE: Free T4: 0.57 ng/dL — ABNORMAL LOW (ref 0.61–1.12)

## 2020-11-10 LAB — LACTATE DEHYDROGENASE: LDH: 141 U/L (ref 98–192)

## 2020-11-10 LAB — D-DIMER, QUANTITATIVE: D-Dimer, Quant: <0.27 ug/mL-FEU (ref 0.00–0.50)

## 2020-11-10 MED ORDER — HEPARIN SOD (PORK) LOCK FLUSH 100 UNIT/ML IV SOLN
500.0000 [IU] | Freq: Once | INTRAVENOUS | Status: AC
  Administered 2020-11-10: 500 [IU] via INTRAVENOUS
  Filled 2020-11-10: qty 5

## 2020-11-10 MED ORDER — SODIUM CHLORIDE 0.9% FLUSH
10.0000 mL | Freq: Once | INTRAVENOUS | Status: AC
  Administered 2020-11-10: 10 mL via INTRAVENOUS
  Filled 2020-11-10: qty 10

## 2020-11-10 NOTE — Patient Instructions (Signed)
Implanted Port Insertion, Care After This sheet gives you information about how to care for yourself after your procedure. Your health care provider may also give you more specific instructions. If you have problems or questions, contact your health care provider. What can I expect after the procedure? After the procedure, it is common to have:  Discomfort at the port insertion site.  Bruising on the skin over the port. This should improve over 3-4 days. Follow these instructions at home: Port care  After your port is placed, you will get a manufacturer's information card. The card has information about your port. Keep this card with you at all times.  Take care of the port as told by your health care provider. Ask your health care provider if you or a family member can get training for taking care of the port at home. A home health care nurse may also take care of the port.  Make sure to remember what type of port you have. Incision care  Follow instructions from your health care provider about how to take care of your port insertion site. Make sure you: ? Wash your hands with soap and water before and after you change your bandage (dressing). If soap and water are not available, use hand sanitizer. ? Change your dressing as told by your health care provider. ? Leave stitches (sutures), skin glue, or adhesive strips in place. These skin closures may need to stay in place for 2 weeks or longer. If adhesive strip edges start to loosen and curl up, you may trim the loose edges. Do not remove adhesive strips completely unless your health care provider tells you to do that.  Check your port insertion site every day for signs of infection. Check for: ? Redness, swelling, or pain. ? Fluid or blood. ? Warmth. ? Pus or a bad smell.      Activity  Return to your normal activities as told by your health care provider. Ask your health care provider what activities are safe for you.  Do not  lift anything that is heavier than 10 lb (4.5 kg), or the limit that you are told, until your health care provider says that it is safe. General instructions  Take over-the-counter and prescription medicines only as told by your health care provider.  Do not take baths, swim, or use a hot tub until your health care provider approves. Ask your health care provider if you may take showers. You may only be allowed to take sponge baths.  Do not drive for 24 hours if you were given a sedative during your procedure.  Wear a medical alert bracelet in case of an emergency. This will tell any health care providers that you have a port.  Keep all follow-up visits as told by your health care provider. This is important. Contact a health care provider if:  You cannot flush your port with saline as directed, or you cannot draw blood from the port.  You have a fever or chills.  You have redness, swelling, or pain around your port insertion site.  You have fluid or blood coming from your port insertion site.  Your port insertion site feels warm to the touch.  You have pus or a bad smell coming from the port insertion site. Get help right away if:  You have chest pain or shortness of breath.  You have bleeding from your port that you cannot control. Summary  Take care of the port as told by your   health care provider. Keep the manufacturer's information card with you at all times.  Change your dressing as told by your health care provider.  Contact a health care provider if you have a fever or chills or if you have redness, swelling, or pain around your port insertion site.  Keep all follow-up visits as told by your health care provider. This information is not intended to replace advice given to you by your health care provider. Make sure you discuss any questions you have with your health care provider. Document Revised: 12/25/2017 Document Reviewed: 12/25/2017 Elsevier Patient Education   2021 [employer]  

## 2020-11-10 NOTE — Progress Notes (Signed)
Hematology and Oncology Follow Up Visit  Sherry Williams [id number] 04/23/1961 60 y.o. 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  She and her husband are incredibly tanned.  They are down at the beach.  They really enjoyed himself.  She is more active now.  She is walking.  She started to run a little bit.  We did do a PET scan on her.  This was done on May 27.  Thankfully, the PET scan did not show any evidence of recurrent endometrial cancer.  Everything really looks fantastic.  She has had no abdominal pain.  She is eating well.  She has had no problems with cough or shortness of breath..  There is been no problems with headaches.  She is on the Arixtra at 5 mg subcu daily.  We are going to see about a transcranial Doppler to see about the source of the emboli.  It is possible we may not be able to find it.  She is still getting her complementary infusions with vitamins.  She is doing this weekly.  Her last CA125 was 10.3.  She has had no fever..  She did have COVID.  She certainly has gotten through this.  There is been no change in bowel or bladder habits.  Overall, I would say her performance status is ECOG 0.     Medications:  Current Outpatient Medications:  .  amoxicillin (AMOXIL) 500 MG capsule, Take 500 mg by mouth 3 (three) times daily., Disp: , Rfl:  .  ASPIRIN 81 PO, Take by mouth daily., Disp: , Rfl:  .  atorvastatin (LIPITOR) 40 MG tablet, , Disp: , Rfl:  .  dexamethasone (DECADRON) 4 MG tablet, Take 2 tablets (8 mg total) by mouth daily. Start the day after carboplatin chemotherapy for 3 days., Disp: 30 tablet, Rfl:  1 .  fondaparinux (ARIXTRA) 5 MG/0.4ML SOLN injection, Inject 0.4 mLs (5 mg total) into the skin daily., Disp: 12 mL, Rfl: 5 .  itraconazole (SPORANOX) 100 MG capsule, Take 100 mg by mouth 2 (two) times daily., Disp: , Rfl:  .  lidocaine-prilocaine (EMLA) cream, Apply to affected area once, Disp: 30 g, Rfl: 3 .  LORazepam (ATIVAN) 0.5 MG tablet, Take 1 tablet (0.5 mg total) by mouth every 6 (six) hours as needed (Nausea or vomiting). (Patient not taking: Reported on 03/16/2020), Disp: 30 tablet, Rfl: 0 .  ondansetron (ZOFRAN) 8 MG tablet, TAKE 1 TABLET BY MOUTH TWICE DAILY AS NEEDED FOR REFRACTORY NAUSEA/ VOMITING. START ON DAY 3 AFTER CARBOPLATIN CHEMO, Disp: 30 tablet, Rfl: 1 .  prochlorperazine (COMPAZINE) 10 MG tablet, TAKE 1 TABLET(10 MG) BY MOUTH EVERY 6 HOURS AS NEEDED FOR NAUSEA OR VOMITING, Disp: 30 tablet, Rfl: 1 .  thyroid (ARMOUR THYROID) 60 MG tablet, TAKE 1 TABLET BY MOUTH EVERY DAY BEFORE BREAKFAST. TAKE 1 DAILY 5 DAYS PER WEEK, Disp: 90 tablet, Rfl: 2 .  thyroid (ARMOUR THYROID) 90 MG tablet, TAKE 1 TABLET BY MOUTH EVERY DAY TWO DAYS PER WEEK, Disp: 90 tablet, Rfl: 1  Allergies:  Allergies  Allergen Reactions  . Bee Venom Anaphylaxis and Swelling  . Adhesive [Tape]  Other (See Comments)    Irritation and red  . Aspartame Diarrhea  . Aspartame And Phenylalanine Diarrhea    Past Medical History, Surgical history, Social history, and Family History were reviewed and updated.  Review of Systems: Review of Systems  Constitutional: Negative.   HENT: Negative.   Eyes: Negative.   Respiratory: Negative.   Cardiovascular: Negative.   Gastrointestinal: Positive for abdominal pain.  Genitourinary: Negative.   Musculoskeletal: Negative.   Skin: Negative.   Neurological: Negative.   Endo/Heme/Allergies: Negative.   Psychiatric/Behavioral: Negative.     Physical Exam:  weight is 129 lb (58.5 kg).   Wt Readings from Last 3 Encounters:  11/10/20 129 lb (58.5 kg)  09/17/20 128  lb 4 oz (58.2 kg)  08/03/20 124 lb 8 oz (56.5 kg)    Physical Exam Vitals reviewed.  HENT:     Head: Normocephalic and atraumatic.  Eyes:     Pupils: Pupils are equal, round, and reactive to light.  Cardiovascular:     Rate and Rhythm: Normal rate and regular rhythm.     Heart sounds: Normal heart sounds.  Pulmonary:     Effort: Pulmonary effort is normal.     Breath sounds: Normal breath sounds.  Abdominal:     General: Bowel sounds are normal.     Palpations: Abdomen is soft.     Comments: Abdominal exam shows a soft abdomen.  She has well healing laparoscopic scars.  I think there are 5 laparoscopic scars.  She has no erythema or exudate or warmth associated with these.  She has no fluid wave in the abdomen.  There is no guarding or rebound tenderness.  She has no palpable liver or spleen tip.  Musculoskeletal:        General: No tenderness or deformity. Normal range of motion.     Cervical back: Normal range of motion.  Lymphadenopathy:     Cervical: No cervical adenopathy.  Skin:    General: Skin is warm and dry.     Findings: No erythema or rash.  Neurological:     Mental Status: She is alert and oriented to person, place, and time.  Psychiatric:        Behavior: Behavior normal.        Thought Content: Thought content normal.        Judgment: Judgment normal.      Lab Results  Component Value Date   WBC 3.0 (L) 11/10/2020   HGB 11.2 (L) 11/10/2020   HCT 34.4 (L) 11/10/2020   MCV 91.2 11/10/2020   PLT 215 11/10/2020     Chemistry      Component Value Date/Time   NA 143 09/17/2020 1211   NA 142 02/15/2017 1518   NA 140 04/13/2016 1055   K 4.2 09/17/2020 1211   K 4.1 02/15/2017 1518   K 4.1 04/13/2016 1055   CL 104 09/17/2020 1211   CL 107 02/15/2017 1518   CO2 31 09/17/2020 1211   CO2 29 02/15/2017 1518   CO2 27 04/13/2016 1055   BUN 16 09/17/2020 1211   BUN 17 02/15/2017 1518   BUN 22.1 04/13/2016 1055   CREATININE 0.76 09/17/2020 1211    CREATININE 1.0 02/15/2017 1518   CREATININE 0.8 04/13/2016 1055      Component Value Date/Time   CALCIUM 10.1 09/17/2020 1211   CALCIUM 9.3 02/15/2017 1518   CALCIUM 9.9 04/13/2016 1055   ALKPHOS 97 09/17/2020 1211   ALKPHOS 79 02/15/2017 1518   ALKPHOS  81 04/13/2016 1055   AST 17 09/17/2020 1211   AST 20 04/13/2016 1055   ALT 15 09/17/2020 1211   ALT 24 02/15/2017 1518   ALT 22 04/13/2016 1055   BILITOT 0.4 09/17/2020 1211   BILITOT 0.44 04/13/2016 1055      Impression and Plan: Ms. Suto is a 60 year old Caucasian female.  She has had issues with recurrence of thromboembolic events.  She has had these systemically and in the brain.  We then found that she had relapse of the uterine cancer.  She has had systemic chemotherapy for this.  She has gone into remission.  I think we now get her through the summertime without having to do any scans or even have to see her.  She is doing fantastic.  She has great support from her family.  She is incredibly motivated.  She will continue her infusions.  It sounds like she is going to be tapered down to every other week.  We will set up a transcranial Doppler for her.  We will see about getting this next week.  We will see about the PET scan to be done for see her again.  We will set this up after Labor Day.  I just excited and happy that she is doing so well.  She looks fantastic.  Again, she really has done a superb job.    [name], MD 6/1/20223:44 PM

## 2020-11-11 ENCOUNTER — Telehealth: Payer: Self-pay

## 2020-11-11 LAB — THYROID PEROXIDASE ANTIBODY: Thyroperoxidase Ab SerPl-aCnc: 18 IU/mL (ref 0–34)

## 2020-11-11 LAB — T3, FREE: T3, Free: 2.5 pg/mL (ref 2.0–4.4)

## 2020-11-11 LAB — CA 125: Cancer Antigen (CA) 125: 7.8 U/mL (ref 0.0–38.1)

## 2020-11-11 LAB — TSH: TSH: 1.569 u[IU]/mL (ref 0.308–3.960)

## 2020-11-11 NOTE — Telephone Encounter (Signed)
S/w pt and she is aware of her appts per 11/10/20 los, aware that pet will be approved closer to appt time and that she will get a call from central sch at [facility]

## 2020-11-15 LAB — THYROGLOBULIN LEVEL: Thyroglobulin: 7.8 ng/mL

## 2020-11-18 ENCOUNTER — Ambulatory Visit (HOSPITAL_COMMUNITY): Payer: 59

## 2020-11-21 LAB — T3, REVERSE: T3, Reverse: 12.2 ng/dL (ref 9.2–24.1)

## 2020-11-25 ENCOUNTER — Ambulatory Visit (HOSPITAL_COMMUNITY)
Admission: RE | Admit: 2020-11-25 | Discharge: 2020-11-25 | Disposition: A | Discharge status: Home / Self Care | Payer: 59 | Source: Ambulatory Visit | Attending: Hematology & Oncology | Admitting: Hematology & Oncology

## 2020-11-25 ENCOUNTER — Other Ambulatory Visit: Payer: Self-pay

## 2020-11-25 DIAGNOSIS — I63219 Cerebral infarction due to unspecified occlusion or stenosis of unspecified vertebral arteries: Secondary | ICD-10-CM | POA: Diagnosis present

## 2020-11-25 NOTE — Progress Notes (Signed)
VASCULAR LAB    TCD with [facility] has been performed.  See CV proc for preliminary results.   Abdelrahman Nair, RVT 11/25/2020, 3:17 PM

## 2020-11-28 ENCOUNTER — Other Ambulatory Visit: Payer: Self-pay | Admitting: Family Medicine

## 2020-12-25 ENCOUNTER — Other Ambulatory Visit: Payer: Self-pay | Admitting: Family Medicine

## 2020-12-27 ENCOUNTER — Encounter: Payer: Self-pay | Admitting: Hematology & Oncology

## 2020-12-27 ENCOUNTER — Other Ambulatory Visit: Payer: Self-pay | Admitting: Family

## 2020-12-28 ENCOUNTER — Other Ambulatory Visit: Payer: Self-pay | Admitting: Family

## 2020-12-28 ENCOUNTER — Telehealth: Payer: Self-pay | Admitting: *Deleted

## 2020-12-28 DIAGNOSIS — Z7901 Long term (current) use of anticoagulants: Secondary | ICD-10-CM

## 2020-12-28 DIAGNOSIS — C541 Malignant neoplasm of endometrium: Secondary | ICD-10-CM

## 2020-12-28 DIAGNOSIS — I82401 Acute embolism and thrombosis of unspecified deep veins of right lower extremity: Secondary | ICD-10-CM

## 2020-12-28 NOTE — Telephone Encounter (Signed)
Call received from patient stating that the hematoma to her right thigh is better and would like to know if she can cancel the CT scan.  Dr. Marin Olp notified and OK received to cancel CT scan.  Pt notified and CT canceled per order of Dr. Marin Olp.

## 2021-01-24 ENCOUNTER — Other Ambulatory Visit: Payer: Self-pay | Admitting: *Deleted

## 2021-01-24 ENCOUNTER — Encounter: Payer: Self-pay | Admitting: Hematology & Oncology

## 2021-01-24 DIAGNOSIS — C541 Malignant neoplasm of endometrium: Secondary | ICD-10-CM

## 2021-02-11 ENCOUNTER — Encounter: Payer: Self-pay | Admitting: Hematology & Oncology

## 2021-02-21 ENCOUNTER — Ambulatory Visit (HOSPITAL_COMMUNITY)
Admission: RE | Admit: 2021-02-21 | Discharge: 2021-02-21 | Disposition: A | Discharge status: Home / Self Care | Payer: 59 | Source: Ambulatory Visit | Attending: Hematology & Oncology | Admitting: Hematology & Oncology

## 2021-02-21 ENCOUNTER — Other Ambulatory Visit: Payer: Self-pay

## 2021-02-21 DIAGNOSIS — C541 Malignant neoplasm of endometrium: Secondary | ICD-10-CM | POA: Diagnosis not present

## 2021-02-21 LAB — GLUCOSE, CAPILLARY: Glucose-Capillary: 85 mg/dL (ref 70–99)

## 2021-02-21 MED ORDER — FLUDEOXYGLUCOSE F - 18 (FDG) INJECTION
6.4000 | Freq: Once | INTRAVENOUS | Status: AC | PRN
  Administered 2021-02-21: 6.06 via INTRAVENOUS

## 2021-02-22 ENCOUNTER — Encounter: Payer: Self-pay | Admitting: *Deleted

## 2021-02-23 ENCOUNTER — Telehealth: Payer: Self-pay

## 2021-02-23 ENCOUNTER — Encounter: Payer: Self-pay | Admitting: Hematology & Oncology

## 2021-02-23 ENCOUNTER — Inpatient Hospital Stay: Payer: 59

## 2021-02-23 ENCOUNTER — Inpatient Hospital Stay: Payer: 59 | Attending: Hematology & Oncology

## 2021-02-23 ENCOUNTER — Other Ambulatory Visit: Payer: Self-pay

## 2021-02-23 ENCOUNTER — Inpatient Hospital Stay (HOSPITAL_BASED_OUTPATIENT_CLINIC_OR_DEPARTMENT_OTHER): Payer: 59 | Admitting: Hematology & Oncology

## 2021-02-23 VITALS — BP 95/53 | HR 58 | Temp 98.7°F | Resp 17 | Wt 126.0 lb

## 2021-02-23 DIAGNOSIS — I82401 Acute embolism and thrombosis of unspecified deep veins of right lower extremity: Secondary | ICD-10-CM

## 2021-02-23 DIAGNOSIS — Z86718 Personal history of other venous thrombosis and embolism: Secondary | ICD-10-CM | POA: Diagnosis present

## 2021-02-23 DIAGNOSIS — E063 Autoimmune thyroiditis: Secondary | ICD-10-CM

## 2021-02-23 DIAGNOSIS — C541 Malignant neoplasm of endometrium: Secondary | ICD-10-CM | POA: Diagnosis not present

## 2021-02-23 DIAGNOSIS — Z95828 Presence of other vascular implants and grafts: Secondary | ICD-10-CM

## 2021-02-23 DIAGNOSIS — Z7901 Long term (current) use of anticoagulants: Secondary | ICD-10-CM | POA: Diagnosis not present

## 2021-02-23 DIAGNOSIS — C55 Malignant neoplasm of uterus, part unspecified: Secondary | ICD-10-CM | POA: Diagnosis present

## 2021-02-23 LAB — CBC WITH DIFFERENTIAL (CANCER CENTER ONLY)
Abs Immature Granulocytes: 0.01 10*3/uL (ref 0.00–0.07)
Basophils Absolute: 0 10*3/uL (ref 0.0–0.1)
Basophils Relative: 0 %
Eosinophils Absolute: 0 10*3/uL (ref 0.0–0.5)
Eosinophils Relative: 1 %
HCT: 37.2 % (ref 36.0–46.0)
Hemoglobin: 12.2 g/dL (ref 12.0–15.0)
Immature Granulocytes: 0 %
Lymphocytes Relative: 28 %
Lymphs Abs: 1 10*3/uL (ref 0.7–4.0)
MCH: 30.6 pg (ref 26.0–34.0)
MCHC: 32.8 g/dL (ref 30.0–36.0)
MCV: 93.2 fL (ref 80.0–100.0)
Monocytes Absolute: 0.4 10*3/uL (ref 0.1–1.0)
Monocytes Relative: 11 %
Neutro Abs: 2.1 10*3/uL (ref 1.7–7.7)
Neutrophils Relative %: 60 %
Platelet Count: 245 10*3/uL (ref 150–400)
RBC: 3.99 MIL/uL (ref 3.87–5.11)
RDW: 13 % (ref 11.5–15.5)
WBC Count: 3.6 10*3/uL — ABNORMAL LOW (ref 4.0–10.5)
nRBC: 0 % (ref 0.0–0.2)

## 2021-02-23 LAB — CMP (CANCER CENTER ONLY)
ALT: 14 U/L (ref 0–44)
AST: 15 U/L (ref 15–41)
Albumin: 4.5 g/dL (ref 3.5–5.0)
Alkaline Phosphatase: 101 U/L (ref 38–126)
Anion gap: 9 (ref 5–15)
BUN: 17 mg/dL (ref 8–23)
CO2: 29 mmol/L (ref 22–32)
Calcium: 9.5 mg/dL (ref 8.9–10.3)
Chloride: 104 mmol/L (ref 98–111)
Creatinine: 0.75 mg/dL (ref 0.44–1.00)
GFR, Estimated: >60 mL/min (ref >60)
Glucose, Bld: 67 mg/dL — ABNORMAL LOW (ref 70–99)
Potassium: 4.1 mmol/L (ref 3.5–5.1)
Sodium: 142 mmol/L (ref 135–145)
Total Bilirubin: 0.5 mg/dL (ref 0.3–1.2)
Total Protein: 6.3 g/dL — ABNORMAL LOW (ref 6.5–8.1)

## 2021-02-23 LAB — TSH: TSH: 1.171 u[IU]/mL (ref 0.308–3.960)

## 2021-02-23 LAB — C-REACTIVE PROTEIN: CRP: 0.6 mg/dL (ref <1.0)

## 2021-02-23 LAB — SEDIMENTATION RATE: Sed Rate: 8 mm/hr (ref 0–22)

## 2021-02-23 LAB — LACTATE DEHYDROGENASE: LDH: 176 U/L (ref 98–192)

## 2021-02-23 MED ORDER — SODIUM CHLORIDE 0.9% FLUSH
10.0000 mL | Freq: Once | INTRAVENOUS | Status: AC
Start: 2021-02-23 — End: 2021-02-23
  Administered 2021-02-23: 10 mL via INTRAVENOUS

## 2021-02-23 MED ORDER — HEPARIN SOD (PORK) LOCK FLUSH 100 UNIT/ML IV SOLN
500.0000 [IU] | Freq: Once | INTRAVENOUS | Status: AC
  Administered 2021-02-23: 500 [IU] via INTRAVENOUS

## 2021-02-23 NOTE — Patient Instructions (Signed)
Tunneled Central Venous Catheter Flushing Guide It is important to flush your tunneled central venous catheter each time you use it, both before and after you use it. Flushing your catheter will help prevent it from clogging. What are the risks? Risks may include: Infection. Air getting into the catheter and bloodstream. Supplies needed: A clean pair of gloves. A disinfecting wipe. Use an alcohol wipe, chlorhexidine wipe, or iodine wipe as told by your health care provider. A 10 mL syringe that has been prefilled with saline solution. An empty 10 mL syringe, if a substance called heparin was injected into your catheter. How to flush your catheter When you flush your catheter, make sure you follow any specific instructions from your health care provider or the manufacturer. These are general guidelines. Flushing your catheter before use If there is heparin in your catheter: Wash your hands with soap and water. Put on gloves. Scrub the injection cap for a minimum of 15 seconds with a disinfecting wipe. Unclamp the catheter. Attach the empty syringe to the injection cap. Pull the syringe plunger back and withdraw 10 mL of blood. Place the syringe into an appropriate waste container. Scrub the injection cap for 15 seconds with a disinfecting wipe. Attach the prefilled syringe to the injection cap. Flush the catheter by pushing the plunger forward until all the liquid from the syringe is in the catheter. Remove the syringe from the injection cap. Clamp the catheter. If there is no heparin in your catheter: Wash your hands with soap and water. Put on gloves. Scrub the injection cap for 15 seconds with a disinfecting wipe. Unclamp the catheter. Attach the prefilled syringe to the injection cap. Flush the catheter by pushing the plunger forward until 5 mL of the liquid from the syringe is in the catheter. Pull back on the syringe until you see blood in the catheter. If you have been asked  to collect any blood, follow your health care provider's instructions. Otherwise, flush the catheter with the rest of the solution from the syringe. Remove the syringe from the injection cap. Clamp the catheter.  Flushing your catheter after use Wash your hands with soap and water. Put on gloves. Scrub the injection cap for 15 seconds with a disinfecting wipe. Unclamp the catheter. Attach the prefilled syringe to the injection cap. Flush the catheter by pushing the plunger forward until all of the liquid from the syringe is in the catheter. Remove the syringe from the injection cap. Clamp the catheter. Problems and solutions If blood cannot be completely cleared from the injection cap, you may need to have the injection cap replaced. If the catheter is difficult to flush, use the pulsing method. The pulsing method involves pushing only a few milliliters of solution into the catheter at a time and pausing between pushes. If you do not see blood in the catheter when you pull back on the syringe, change your body position, such as by raising your arms above your head. Take a deep breath and cough. Then, pull back on the syringe. If you still do not see blood, flush the catheter with a small amount of solution. Then, change positions again and take a breath or cough. Pull back on the syringe again. If you still do not see blood, finish flushing the catheter and contact your health care provider. Do not use your catheter until your health care provider says it is okay. General tips Have someone help you flush your catheter, if possible. Do not force fluid   through your catheter. Do not use a syringe that is larger or smaller than 10 mL. Using a smaller syringe can make the catheter burst. Do not use your catheter without flushing it first if it has heparin in it. Contact a health care provider if: You cannot see any blood in the catheter when you flush it before using it. Your catheter is difficult  to flush. Get help right away if: You cannot flush the catheter. The catheter leaks when you flush it or when there is fluid in it. There are cracks or breaks in the catheter. Summary It is important to flush your tunneled central venous catheter each time you use it, both before and after you use it. Scrub the injection cap for 15 seconds with a disinfecting wipe before and after you flush it. When you flush your catheter, make sure you follow any specific instructions from your health care provider or the manufacturer. Get help right away if you cannot flush the catheter. This information is not intended to replace advice given to you by your health care provider. Make sure you discuss any questions you have with your health care provider. Document Revised: 08/07/2019 Document Reviewed: 08/14/2018 Elsevier Patient Education  2022 [employer] Inc[employer]  

## 2021-02-23 NOTE — Progress Notes (Signed)
Hematology and Oncology Follow Up Visit  Sherry Williams [id number] 1960-10-22 60 y.o. 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   She and her husband are both incredibly active.  They have been out playing pickle ball.  I think she did have a fall but thankfully did not hurt her self.  She will be of a hematoma on the right hip area but this is resolved.  We did go ahead and do another PET scan on her.  This was done on 02/21/2021.  The PET scan showed no evidence of recurrent disease.  She is done quite nicely.  She is still doing her complementary therapy.  This does seem to agree with her.  She has had a good appetite.  There is no nausea or vomiting.  There is been no change in bowel or bladder habits.  She has had no bleeding.  She is still on Arixtra.  Question is whether or not we can get her off the Arixtra.  I know this is somewhat unclear.  I know she is in remission.  I would just hate to see her have another thromboembolic event.  Overall, I would have to say her performance status is probably ECOG 0.    Medications:  Current Outpatient Medications:    ASPIRIN 81 PO, Take by mouth daily., Disp: , Rfl:    atorvastatin (LIPITOR) 40 MG tablet, , Disp: , Rfl:    dexamethasone (DECADRON) 4 MG tablet, Take 2 tablets (8 mg total) by mouth daily. Start the day after carboplatin chemotherapy for 3 days., Disp: 30 tablet, Rfl: 1   fondaparinux (ARIXTRA) 5 MG/0.4ML SOLN injection, Inject 0.4 mLs (5 mg total) into the skin daily., Disp: 12 mL, Rfl: 5   itraconazole (SPORANOX) 100 MG capsule, Take 100 mg by mouth 2 (two) times daily., Disp: ,  Rfl:    lidocaine-prilocaine (EMLA) cream, Apply to affected area once, Disp: 30 g, Rfl: 3   LORazepam (ATIVAN) 0.5 MG tablet, Take 1 tablet (0.5 mg total) by mouth every 6 (six) hours as needed (Nausea or vomiting). (Patient not taking: Reported on 03/16/2020), Disp: 30 tablet, Rfl: 0   ondansetron (ZOFRAN) 8 MG tablet, TAKE 1 TABLET BY MOUTH TWICE DAILY AS NEEDED FOR REFRACTORY NAUSEA/ VOMITING. START ON DAY 3 AFTER CARBOPLATIN CHEMO, Disp: 30 tablet, Rfl: 1   prochlorperazine (COMPAZINE) 10 MG tablet, TAKE 1 TABLET(10 MG) BY MOUTH EVERY 6 HOURS AS NEEDED FOR NAUSEA OR VOMITING, Disp: 30 tablet, Rfl: 1   thyroid (ARMOUR THYROID) 60 MG tablet, TAKE 1 TABLET BY MOUTH EVERY DAY 5 DAYS PER WEEK, Disp: 90 tablet, Rfl: 2   thyroid (ARMOUR THYROID) 90 MG tablet, TAKE 1 TABLET BY MOUTH EVERY DAY 2 DAYS PER WEEK, Disp: 90 tablet, Rfl: 1  Allergies:  Allergies  Allergen Reactions   Bee Venom Anaphylaxis and Swelling   Adhesive [Tape] Other (See Comments)    Irritation and red   Aspartame Diarrhea   Aspartame And Phenylalanine Diarrhea    Past Medical History, Surgical history, Social history, and Family History were reviewed and updated.  Review of Systems: Review of Systems  Constitutional: Negative.  HENT: Negative.    Eyes: Negative.   Respiratory: Negative.    Cardiovascular: Negative.   Gastrointestinal:  Positive for abdominal pain.  Genitourinary: Negative.   Musculoskeletal: Negative.   Skin: Negative.   Neurological: Negative.   Endo/Heme/Allergies: Negative.   Psychiatric/Behavioral: Negative.     Physical Exam:  weight is 126 lb (57.2 kg). Her oral temperature is 98.7 F (37.1 C). Her blood pressure is 95/53 (abnormal) and her pulse is 58 (abnormal). Her respiration is 17 and oxygen saturation is 100%.   Wt Readings from Last 3 Encounters:  02/23/21 126 lb (57.2 kg)  11/10/20 129 lb (58.5 kg)  09/17/20 128 lb 4 oz (58.2 kg)    Physical Exam Vitals reviewed.  HENT:      Head: Normocephalic and atraumatic.  Eyes:     Pupils: Pupils are equal, round, and reactive to light.  Cardiovascular:     Rate and Rhythm: Normal rate and regular rhythm.     Heart sounds: Normal heart sounds.  Pulmonary:     Effort: Pulmonary effort is normal.     Breath sounds: Normal breath sounds.  Abdominal:     General: Bowel sounds are normal.     Palpations: Abdomen is soft.     Comments: Abdominal exam shows a soft abdomen.  She has well healing laparoscopic scars.  I think there are 5 laparoscopic scars.  She has no erythema or exudate or warmth associated with these.  She has no fluid wave in the abdomen.  There is no guarding or rebound tenderness.  She has no palpable liver or spleen tip.  Musculoskeletal:        General: No tenderness or deformity. Normal range of motion.     Cervical back: Normal range of motion.  Lymphadenopathy:     Cervical: No cervical adenopathy.  Skin:    General: Skin is warm and dry.     Findings: No erythema or rash.  Neurological:     Mental Status: She is alert and oriented to person, place, and time.  Psychiatric:        Behavior: Behavior normal.        Thought Content: Thought content normal.        Judgment: Judgment normal.     Lab Results  Component Value Date   WBC 3.6 (L) 02/23/2021   HGB 12.2 02/23/2021   HCT 37.2 02/23/2021   MCV 93.2 02/23/2021   PLT 245 02/23/2021     Chemistry      Component Value Date/Time   NA 141 11/10/2020 1444   NA 142 02/15/2017 1518   NA 140 04/13/2016 1055   K 3.8 11/10/2020 1444   K 4.1 02/15/2017 1518   K 4.1 04/13/2016 1055   CL 104 11/10/2020 1444   CL 107 02/15/2017 1518   CO2 27 11/10/2020 1444   CO2 29 02/15/2017 1518   CO2 27 04/13/2016 1055   BUN 12 11/10/2020 1444   BUN 17 02/15/2017 1518   BUN 22.1 04/13/2016 1055   CREATININE 0.72 11/10/2020 1444   CREATININE 1.0 02/15/2017 1518   CREATININE 0.8 04/13/2016 1055      Component Value Date/Time   CALCIUM 9.2  11/10/2020 1444   CALCIUM 9.3 02/15/2017 1518   CALCIUM 9.9 04/13/2016 1055   ALKPHOS 79 11/10/2020 1444   ALKPHOS 79 02/15/2017 1518   ALKPHOS 81 04/13/2016 1055   AST 16 11/10/2020 1444   AST 20 04/13/2016 1055   ALT 17 11/10/2020 1444  ALT 24 02/15/2017 1518   ALT 22 04/13/2016 1055   BILITOT 0.4 11/10/2020 1444   BILITOT 0.44 04/13/2016 1055      Impression and Plan: Ms. Amador is a 60 year old Caucasian female.  She has had issues with recurrence of thromboembolic events.  She has had these systemically and in the brain.  We then found that she had relapse of the uterine cancer.  She has had systemic chemotherapy for this.  She has gone into remission.  I am impressed by the PET scan results.  Hopefully, the chemotherapy that she had will clearly help her and keep her in remission for the long-term.  I forgot to mention that she did have a transcranial Doppler after last saw her.  This was unremarkable.  There is no vascular abnormality that could be found.  I think we can now do another PET scan at the end of the year.  We will try to move the PET scans out next year if everything looks okay.  I am just happy that her quality of life is so good.  She and her husband are both incredibly active.  I am just happy that she is able to enjoy her family.  I will plan to see her back after all the holidays.   [name], MD 9/14/202210:02 AM

## 2021-02-23 NOTE — Telephone Encounter (Signed)
Appts made and printed for pt per 02/23/21 los, pt to gain calendar at [facility], PET order noted for wl   Rosemary Mossbarger

## 2021-02-24 LAB — CA 125: Cancer Antigen (CA) 125: 14 U/mL (ref 0.0–38.1)

## 2021-03-18 ENCOUNTER — Other Ambulatory Visit: Payer: Self-pay | Admitting: Hematology & Oncology

## 2021-03-18 DIAGNOSIS — C541 Malignant neoplasm of endometrium: Secondary | ICD-10-CM

## 2021-03-31 ENCOUNTER — Encounter: Payer: Self-pay | Admitting: Hematology & Oncology

## 2021-04-04 ENCOUNTER — Inpatient Hospital Stay: Payer: 59 | Attending: Hematology & Oncology

## 2021-04-04 ENCOUNTER — Inpatient Hospital Stay: Payer: 59

## 2021-04-04 ENCOUNTER — Other Ambulatory Visit: Payer: Self-pay

## 2021-04-04 DIAGNOSIS — C55 Malignant neoplasm of uterus, part unspecified: Secondary | ICD-10-CM | POA: Insufficient documentation

## 2021-04-04 DIAGNOSIS — Z452 Encounter for adjustment and management of vascular access device: Secondary | ICD-10-CM | POA: Insufficient documentation

## 2021-04-04 DIAGNOSIS — C541 Malignant neoplasm of endometrium: Secondary | ICD-10-CM

## 2021-04-04 DIAGNOSIS — I82401 Acute embolism and thrombosis of unspecified deep veins of right lower extremity: Secondary | ICD-10-CM

## 2021-04-04 NOTE — Patient Instructions (Signed)
Implanted Port Home Guide An implanted port is a device that is placed under the skin. It is usually placed in the chest. The device can be used to give IV medicine, to take blood, or for dialysis. You may have an implanted port if: You need IV medicine that would be irritating to the small veins in your hands or arms. You need IV medicines, such as antibiotics, for a long period of time. You need IV nutrition for a long period of time. You need dialysis. When you have a port, your health care provider can choose to use the port instead of veins in your arms for these procedures. You may have fewer limitations when using a port than you would if you used other types of long-term IVs, and you will likely be able to return to normal activities after your incision heals. An implanted port has two main parts: Reservoir. The reservoir is the part where a needle is inserted to give medicines or draw blood. The reservoir is round. After it is placed, it appears as a small, raised area under your skin. Catheter. The catheter is a thin, flexible tube that connects the reservoir to a vein. Medicine that is inserted into the reservoir goes into the catheter and then into the vein. How is my port accessed? To access your port: A numbing cream may be placed on the skin over the port site. Your health care provider will put on a mask and sterile gloves. The skin over your port will be cleaned carefully with a germ-killing soap and allowed to dry. Your health care provider will gently pinch the port and insert a needle into it. Your health care provider will check for a blood return to make sure the port is in the vein and is not clogged. If your port needs to remain accessed to get medicine continuously (constant infusion), your health care provider will place a clear bandage (dressing) over the needle site. The dressing and needle will need to be changed every week, or as told by your health care provider. What  is flushing? Flushing helps keep the port from getting clogged. Follow instructions from your health care provider about how and when to flush the port. Ports are usually flushed with saline solution or a medicine called heparin. The need for flushing will depend on how the port is used: If the port is only used from time to time to give medicines or draw blood, the port may need to be flushed: Before and after medicines have been given. Before and after blood has been drawn. As part of routine maintenance. Flushing may be recommended every 4-6 weeks. If a constant infusion is running, the port may not need to be flushed. Throw away any syringes in a disposal container that is meant for sharp items (sharps container). You can buy a sharps container from a pharmacy, or you can make one by using an empty hard plastic bottle with a cover. How long will my port stay implanted? The port can stay in for as long as your health care provider thinks it is needed. When it is time for the port to come out, a surgery will be done to remove it. The surgery will be similar to the procedure that was done to put the port in. Follow these instructions at home:  Flush your port as told by your health care provider. If you need an infusion over several days, follow instructions from your health care provider about how   to take care of your port site. Make sure you: Wash your hands with soap and water before you change your dressing. If soap and water are not available, use alcohol-based hand sanitizer. Change your dressing as told by your health care provider. Place any used dressings or infusion bags into a plastic bag. Throw that bag in the trash. Keep the dressing that covers the needle clean and dry. Do not get it wet. Do not use scissors or sharp objects near the tube. Keep the tube clamped, unless it is being used. Check your port site every day for signs of infection. Check for: Redness, swelling, or  pain. Fluid or blood. Pus or a bad smell. Protect the skin around the port site. Avoid wearing bra straps that rub or irritate the site. Protect the skin around your port from seat belts. Place a soft pad over your chest if needed. Bathe or shower as told by your health care provider. The site may get wet as long as you are not actively receiving an infusion. Return to your normal activities as told by your health care provider. Ask your health care provider what activities are safe for you. Carry a medical alert card or wear a medical alert bracelet at all times. This will let health care providers know that you have an implanted port in case of an emergency. Get help right away if: You have redness, swelling, or pain at the port site. You have fluid or blood coming from your port site. You have pus or a bad smell coming from the port site. You have a fever. Summary Implanted ports are usually placed in the chest for long-term IV access. Follow instructions from your health care provider about flushing the port and changing bandages (dressings). Take care of the area around your port by avoiding clothing that puts pressure on the area, and by watching for signs of infection. Protect the skin around your port from seat belts. Place a soft pad over your chest if needed. Get help right away if you have a fever or you have redness, swelling, pain, drainage, or a bad smell at the port site. This information is not intended to replace advice given to you by your health care provider. Make sure you discuss any questions you have with your health care provider. Document Revised: 08/18/2020 Document Reviewed: 10/13/2019 Elsevier Patient Education  2022 [employer]vier Inc.  

## 2021-05-11 ENCOUNTER — Encounter: Payer: Self-pay | Admitting: Hematology & Oncology

## 2021-05-12 ENCOUNTER — Telehealth: Payer: Self-pay

## 2021-05-12 ENCOUNTER — Telehealth: Payer: Self-pay | Admitting: *Deleted

## 2021-05-12 ENCOUNTER — Other Ambulatory Visit: Payer: Self-pay

## 2021-05-12 DIAGNOSIS — C541 Malignant neoplasm of endometrium: Secondary | ICD-10-CM

## 2021-05-12 NOTE — Telephone Encounter (Signed)
Per scheduling message Tiffany - called and gave upcoming appointments -  urine test - confirmed

## 2021-05-12 NOTE — Telephone Encounter (Signed)
error 

## 2021-05-13 ENCOUNTER — Inpatient Hospital Stay: Payer: 59 | Attending: Hematology & Oncology

## 2021-05-13 ENCOUNTER — Encounter: Payer: Self-pay | Admitting: *Deleted

## 2021-05-13 ENCOUNTER — Other Ambulatory Visit: Payer: Self-pay

## 2021-05-13 DIAGNOSIS — C541 Malignant neoplasm of endometrium: Secondary | ICD-10-CM

## 2021-05-13 DIAGNOSIS — C55 Malignant neoplasm of uterus, part unspecified: Secondary | ICD-10-CM | POA: Diagnosis present

## 2021-05-13 LAB — URINALYSIS, COMPLETE (UACMP) WITH MICROSCOPIC
Bilirubin Urine: NEGATIVE
Glucose, UA: NEGATIVE mg/dL
Hgb urine dipstick: NEGATIVE
Ketones, ur: NEGATIVE mg/dL
Nitrite: NEGATIVE
Protein, ur: NEGATIVE mg/dL
Specific Gravity, Urine: 1.015 (ref 1.005–1.030)
pH: 7.5 (ref 5.0–8.0)

## 2021-05-13 NOTE — Progress Notes (Signed)
Per Dr. Marin Olp, this nurse informed patient that there is no blood in her urine. Dr. Marin Olp is going to send the urine for a culture and sensitivity. She verbalized understanding.

## 2021-05-15 ENCOUNTER — Encounter: Payer: Self-pay | Admitting: Hematology & Oncology

## 2021-05-15 LAB — URINE CULTURE

## 2021-05-16 ENCOUNTER — Other Ambulatory Visit: Payer: Self-pay | Admitting: *Deleted

## 2021-05-16 ENCOUNTER — Other Ambulatory Visit: Payer: Self-pay

## 2021-05-16 ENCOUNTER — Inpatient Hospital Stay: Payer: 59

## 2021-05-16 DIAGNOSIS — I82401 Acute embolism and thrombosis of unspecified deep veins of right lower extremity: Secondary | ICD-10-CM

## 2021-05-16 DIAGNOSIS — C541 Malignant neoplasm of endometrium: Secondary | ICD-10-CM

## 2021-05-16 DIAGNOSIS — Z95828 Presence of other vascular implants and grafts: Secondary | ICD-10-CM

## 2021-05-16 DIAGNOSIS — C55 Malignant neoplasm of uterus, part unspecified: Secondary | ICD-10-CM | POA: Diagnosis not present

## 2021-05-17 LAB — URINE CULTURE: Culture: <10000 — AB

## 2021-06-08 ENCOUNTER — Other Ambulatory Visit: Payer: Self-pay

## 2021-06-08 ENCOUNTER — Ambulatory Visit (HOSPITAL_COMMUNITY)
Admission: RE | Admit: 2021-06-08 | Discharge: 2021-06-08 | Disposition: A | Discharge status: Home / Self Care | Payer: 59 | Source: Ambulatory Visit | Attending: Hematology & Oncology | Admitting: Hematology & Oncology

## 2021-06-08 DIAGNOSIS — C541 Malignant neoplasm of endometrium: Secondary | ICD-10-CM | POA: Insufficient documentation

## 2021-06-08 LAB — GLUCOSE, CAPILLARY: Glucose-Capillary: 70 mg/dL (ref 70–99)

## 2021-06-08 MED ORDER — FLUDEOXYGLUCOSE F - 18 (FDG) INJECTION
7.0000 | Freq: Once | INTRAVENOUS | Status: AC | PRN
  Administered 2021-06-08: 10:00:00 7 via INTRAVENOUS

## 2021-06-09 ENCOUNTER — Encounter: Payer: Self-pay | Admitting: Hematology & Oncology

## 2021-06-16 ENCOUNTER — Inpatient Hospital Stay: Payer: 59

## 2021-06-16 ENCOUNTER — Inpatient Hospital Stay (HOSPITAL_BASED_OUTPATIENT_CLINIC_OR_DEPARTMENT_OTHER): Payer: 59 | Admitting: Hematology & Oncology

## 2021-06-16 ENCOUNTER — Inpatient Hospital Stay: Payer: 59 | Attending: Hematology & Oncology

## 2021-06-16 ENCOUNTER — Encounter: Payer: Self-pay | Admitting: Hematology & Oncology

## 2021-06-16 ENCOUNTER — Other Ambulatory Visit: Payer: Self-pay

## 2021-06-16 VITALS — BP 101/55 | HR 58 | Temp 97.8°F | Resp 18 | Wt 131.0 lb

## 2021-06-16 DIAGNOSIS — Z7901 Long term (current) use of anticoagulants: Secondary | ICD-10-CM | POA: Diagnosis not present

## 2021-06-16 DIAGNOSIS — C55 Malignant neoplasm of uterus, part unspecified: Secondary | ICD-10-CM | POA: Diagnosis present

## 2021-06-16 DIAGNOSIS — C541 Malignant neoplasm of endometrium: Secondary | ICD-10-CM

## 2021-06-16 DIAGNOSIS — Z86718 Personal history of other venous thrombosis and embolism: Secondary | ICD-10-CM | POA: Diagnosis present

## 2021-06-16 DIAGNOSIS — Z79899 Other long term (current) drug therapy: Secondary | ICD-10-CM | POA: Diagnosis not present

## 2021-06-16 DIAGNOSIS — Z95828 Presence of other vascular implants and grafts: Secondary | ICD-10-CM

## 2021-06-16 DIAGNOSIS — I82401 Acute embolism and thrombosis of unspecified deep veins of right lower extremity: Secondary | ICD-10-CM

## 2021-06-16 LAB — CMP (CANCER CENTER ONLY)
ALT: 20 U/L (ref 0–44)
AST: 19 U/L (ref 15–41)
Albumin: 4.5 g/dL (ref 3.5–5.0)
Alkaline Phosphatase: 74 U/L (ref 38–126)
Anion gap: 8 (ref 5–15)
BUN: 20 mg/dL (ref 8–23)
CO2: 27 mmol/L (ref 22–32)
Calcium: 9.8 mg/dL (ref 8.9–10.3)
Chloride: 106 mmol/L (ref 98–111)
Creatinine: 0.68 mg/dL (ref 0.44–1.00)
GFR, Estimated: >60 mL/min (ref >60)
Glucose, Bld: 71 mg/dL (ref 70–99)
Potassium: 4 mmol/L (ref 3.5–5.1)
Sodium: 141 mmol/L (ref 135–145)
Total Bilirubin: 0.4 mg/dL (ref 0.3–1.2)
Total Protein: 6.2 g/dL — ABNORMAL LOW (ref 6.5–8.1)

## 2021-06-16 LAB — CBC WITH DIFFERENTIAL (CANCER CENTER ONLY)
Abs Immature Granulocytes: 0.01 10*3/uL (ref 0.00–0.07)
Basophils Absolute: 0 10*3/uL (ref 0.0–0.1)
Basophils Relative: 0 %
Eosinophils Absolute: 0.1 10*3/uL (ref 0.0–0.5)
Eosinophils Relative: 2 %
HCT: 35.2 % — ABNORMAL LOW (ref 36.0–46.0)
Hemoglobin: 11.6 g/dL — ABNORMAL LOW (ref 12.0–15.0)
Immature Granulocytes: 0 %
Lymphocytes Relative: 30 %
Lymphs Abs: 0.9 10*3/uL (ref 0.7–4.0)
MCH: 30.4 pg (ref 26.0–34.0)
MCHC: 33 g/dL (ref 30.0–36.0)
MCV: 92.1 fL (ref 80.0–100.0)
Monocytes Absolute: 0.4 10*3/uL (ref 0.1–1.0)
Monocytes Relative: 13 %
Neutro Abs: 1.7 10*3/uL (ref 1.7–7.7)
Neutrophils Relative %: 55 %
Platelet Count: 201 10*3/uL (ref 150–400)
RBC: 3.82 MIL/uL — ABNORMAL LOW (ref 3.87–5.11)
RDW: 13.2 % (ref 11.5–15.5)
WBC Count: 3.1 10*3/uL — ABNORMAL LOW (ref 4.0–10.5)
nRBC: 0 % (ref 0.0–0.2)

## 2021-06-16 LAB — LACTATE DEHYDROGENASE: LDH: 152 U/L (ref 98–192)

## 2021-06-16 MED ORDER — HEPARIN SOD (PORK) LOCK FLUSH 100 UNIT/ML IV SOLN
500.0000 [IU] | Freq: Once | INTRAVENOUS | Status: AC
  Administered 2021-06-16: 500 [IU] via INTRAVENOUS

## 2021-06-16 MED ORDER — SODIUM CHLORIDE 0.9% FLUSH
10.0000 mL | Freq: Once | INTRAVENOUS | Status: AC
  Administered 2021-06-16: 10 mL via INTRAVENOUS

## 2021-06-16 NOTE — Patient Instructions (Signed)

## 2021-06-16 NOTE — Progress Notes (Signed)
Hematology and Oncology Follow Up Visit  Sherry Williams [id number] May 03, 1961 61 y.o. 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   We last saw her back in September.  Since then, she has been doing pretty well.  She really has had no complaints.  She is on Arixtra for the recurrent thromboembolic disease.  We did do a PET scan on her.  This was done on 06/08/2021.  I am not sure exactly what the significance of the report is.  It seems either might be a problem with recurrence.  However, I am not sure this is truly the case by the verbiage that is used.  I think were going to have to follow-up with a another PET scan to see if there is a recurrence.  Of note, her CA125 back in September was 14.  We will see what today's is.  There has been no issues with fever.  She has had no abdominal pain.  There is no change in bowel or bladder habits.  She has had some issues in the past with kidney stones.  There is no cough.  She has had no fever.  She has had no issues with COVID.  She is doing well on the Arixtra.  There is no bleeding.  She is no headaches.  Currently, I would say her performance status is probably ECOG 0.    Medications:  Current Outpatient Medications:    thyroid (ARMOUR) 60 MG tablet, Take 60 mg by mouth daily., Disp: , Rfl:    ARMOUR THYROID 15 MG tablet, Take 15 mg by mouth every morning., Disp: , Rfl:    ASPIRIN 81 PO, Take by mouth daily., Disp: , Rfl:    atorvastatin (LIPITOR) 40 MG tablet, , Disp: , Rfl:    cetirizine (ZYRTEC) 10 MG chewable tablet, Zyrtec, Disp: , Rfl:    fondaparinux (ARIXTRA) 5 MG/0.4ML SOLN injection,  INJECT 1 PEN( 0.4 ML) INTO THE SKIN DAILY, Disp: 12 mL, Rfl: 5   lidocaine-prilocaine (EMLA) cream, APPLY TOPICALLY TO THE AFFECTED AREA 1 TIME, Disp: 30 g, Rfl: 3  Allergies:  Allergies  Allergen Reactions   Bee Venom Anaphylaxis and Swelling   Adhesive [Tape] Other (See Comments)    Irritation and red   Aspartame Diarrhea   Aspartame And Phenylalanine Diarrhea    Past Medical History, Surgical history, Social history, and Family History were reviewed and updated.  Review of Systems: Review of Systems  Constitutional: Negative.   HENT: Negative.    Eyes: Negative.   Respiratory: Negative.    Cardiovascular: Negative.   Gastrointestinal:  Positive for abdominal pain.  Genitourinary: Negative.   Musculoskeletal: Negative.   Skin: Negative.   Neurological: Negative.   Endo/Heme/Allergies: Negative.   Psychiatric/Behavioral: Negative.     Physical Exam:  weight is 131 lb (59.4 kg). Her oral temperature is 97.8 F (36.6 C). Her blood pressure is 101/55 (abnormal) and her pulse is 58 (abnormal). Her respiration is 18 and oxygen saturation is 100%.   Wt Readings from Last 3 Encounters:  06/16/21 131 lb (59.4 kg)  04/04/21 126 lb (57.2 kg)  02/23/21 126 lb (57.2 kg)  Physical Exam Vitals reviewed.  HENT:     Head: Normocephalic and atraumatic.  Eyes:     Pupils: Pupils are equal, round, and reactive to light.  Cardiovascular:     Rate and Rhythm: Normal rate and regular rhythm.     Heart sounds: Normal heart sounds.  Pulmonary:     Effort: Pulmonary effort is normal.     Breath sounds: Normal breath sounds.  Abdominal:     General: Bowel sounds are normal.     Palpations: Abdomen is soft.     Comments: Abdominal exam shows a soft abdomen.  She has well healing laparoscopic scars.  I think there are 5 laparoscopic scars.  She has no erythema or exudate or warmth associated with these.  She has no fluid wave in the abdomen.  There is no guarding or rebound tenderness.   She has no palpable liver or spleen tip.  Musculoskeletal:        General: No tenderness or deformity. Normal range of motion.     Cervical back: Normal range of motion.  Lymphadenopathy:     Cervical: No cervical adenopathy.  Skin:    General: Skin is warm and dry.     Findings: No erythema or rash.  Neurological:     Mental Status: She is alert and oriented to person, place, and time.  Psychiatric:        Behavior: Behavior normal.        Thought Content: Thought content normal.        Judgment: Judgment normal.     Lab Results  Component Value Date   WBC 3.1 (L) 06/16/2021   HGB 11.6 (L) 06/16/2021   HCT 35.2 (L) 06/16/2021   MCV 92.1 06/16/2021   PLT 201 06/16/2021     Chemistry      Component Value Date/Time   NA 141 06/16/2021 0914   NA 142 02/15/2017 1518   NA 140 04/13/2016 1055   K 4.0 06/16/2021 0914   K 4.1 02/15/2017 1518   K 4.1 04/13/2016 1055   CL 106 06/16/2021 0914   CL 107 02/15/2017 1518   CO2 27 06/16/2021 0914   CO2 29 02/15/2017 1518   CO2 27 04/13/2016 1055   BUN 20 06/16/2021 0914   BUN 17 02/15/2017 1518   BUN 22.1 04/13/2016 1055   CREATININE 0.68 06/16/2021 0914   CREATININE 1.0 02/15/2017 1518   CREATININE 0.8 04/13/2016 1055      Component Value Date/Time   CALCIUM 9.8 06/16/2021 0914   CALCIUM 9.3 02/15/2017 1518   CALCIUM 9.9 04/13/2016 1055   ALKPHOS 74 06/16/2021 0914   ALKPHOS 79 02/15/2017 1518   ALKPHOS 81 04/13/2016 1055   AST 19 06/16/2021 0914   AST 20 04/13/2016 1055   ALT 20 06/16/2021 0914   ALT 24 02/15/2017 1518   ALT 22 04/13/2016 1055   BILITOT 0.4 06/16/2021 0914   BILITOT 0.44 04/13/2016 1055      Impression and Plan: Sherry Williams is a 61 year old Caucasian female.  She has had issues with recurrence of thromboembolic events.  She has had these systemically and in the brain.  We then found that she had relapse of the uterine cancer.  She has had systemic chemotherapy for this.  She has gone into  remission.  Again, the PET scan is a little bit concerning.  However, I am not willing to says she has recurrence.  The CA-125 concerning help [place] out.  Again I think we  can have to do another PET scan on her.  This will really tell us what is going on.  I do not see that she has to make any changes with her lifestyle right now.  She is incredibly active.  She eats well.  She is incredibly proactive with her health.  Would get the PET scan in late February.  I will then plan to see her back in March and then at that point will see exactly what might be going on.  If there is recurrence, I would probably would consider her for a PARP inhibitor.   [name], MD 1/5/20235:08 PM

## 2021-06-17 ENCOUNTER — Encounter: Payer: Self-pay | Admitting: *Deleted

## 2021-06-17 LAB — CA 125: Cancer Antigen (CA) 125: 17 U/mL (ref 0.0–38.1)

## 2021-07-11 ENCOUNTER — Encounter: Payer: Self-pay | Admitting: *Deleted

## 2021-07-11 ENCOUNTER — Encounter: Payer: Self-pay | Admitting: Hematology & Oncology

## 2021-07-11 NOTE — Progress Notes (Signed)
Per patient request, this nurse faxed PET scan reports from 9/22 and 12/22 to [place] [place] [facility] [place]. Fax number is [phone]. I gave the patient the number to Imaging so, she could obtain the images.

## 2021-07-29 ENCOUNTER — Telehealth: Payer: Self-pay | Admitting: *Deleted

## 2021-07-29 NOTE — Telephone Encounter (Signed)
Faxed urgent medical records request to [facility] @ [phone]) [phone] and mailed documents through intraoffice mail to [facility]

## 2021-08-03 ENCOUNTER — Telehealth: Payer: Self-pay | Admitting: *Deleted

## 2021-08-03 NOTE — Telephone Encounter (Signed)
Faxed urgent medical records request from [place] to [facility] @ [phone] and mailed documents through intraoffice mail to [employer]

## 2021-08-09 ENCOUNTER — Other Ambulatory Visit: Payer: Self-pay

## 2021-08-09 ENCOUNTER — Ambulatory Visit (HOSPITAL_COMMUNITY)
Admission: RE | Admit: 2021-08-09 | Discharge: 2021-08-09 | Disposition: A | Discharge status: Home / Self Care | Payer: 59 | Source: Ambulatory Visit | Attending: Hematology & Oncology | Admitting: Hematology & Oncology

## 2021-08-09 DIAGNOSIS — C541 Malignant neoplasm of endometrium: Secondary | ICD-10-CM | POA: Insufficient documentation

## 2021-08-09 LAB — GLUCOSE, CAPILLARY: Glucose-Capillary: 91 mg/dL (ref 70–99)

## 2021-08-09 MED ORDER — FLUDEOXYGLUCOSE F - 18 (FDG) INJECTION
6.5000 | Freq: Once | INTRAVENOUS | Status: AC | PRN
  Administered 2021-08-09: 6.37 via INTRAVENOUS

## 2021-08-12 ENCOUNTER — Encounter: Payer: Self-pay | Admitting: Hematology & Oncology

## 2021-08-12 ENCOUNTER — Inpatient Hospital Stay (HOSPITAL_BASED_OUTPATIENT_CLINIC_OR_DEPARTMENT_OTHER): Payer: 59 | Admitting: Hematology & Oncology

## 2021-08-12 ENCOUNTER — Other Ambulatory Visit: Payer: 59

## 2021-08-12 ENCOUNTER — Inpatient Hospital Stay: Payer: 59

## 2021-08-12 ENCOUNTER — Ambulatory Visit: Payer: 59 | Admitting: Hematology & Oncology

## 2021-08-12 ENCOUNTER — Inpatient Hospital Stay: Payer: 59 | Attending: Hematology & Oncology

## 2021-08-12 ENCOUNTER — Other Ambulatory Visit: Payer: Self-pay

## 2021-08-12 VITALS — BP 107/50 | HR 58 | Temp 98.0°F | Resp 18 | Wt 130.0 lb

## 2021-08-12 DIAGNOSIS — Z79899 Other long term (current) drug therapy: Secondary | ICD-10-CM | POA: Insufficient documentation

## 2021-08-12 DIAGNOSIS — Z9221 Personal history of antineoplastic chemotherapy: Secondary | ICD-10-CM | POA: Insufficient documentation

## 2021-08-12 DIAGNOSIS — Z95828 Presence of other vascular implants and grafts: Secondary | ICD-10-CM

## 2021-08-12 DIAGNOSIS — Z7901 Long term (current) use of anticoagulants: Secondary | ICD-10-CM | POA: Diagnosis not present

## 2021-08-12 DIAGNOSIS — Z8542 Personal history of malignant neoplasm of other parts of uterus: Secondary | ICD-10-CM | POA: Insufficient documentation

## 2021-08-12 DIAGNOSIS — C541 Malignant neoplasm of endometrium: Secondary | ICD-10-CM

## 2021-08-12 DIAGNOSIS — Z86718 Personal history of other venous thrombosis and embolism: Secondary | ICD-10-CM | POA: Insufficient documentation

## 2021-08-12 DIAGNOSIS — Z7902 Long term (current) use of antithrombotics/antiplatelets: Secondary | ICD-10-CM | POA: Insufficient documentation

## 2021-08-12 LAB — CMP (CANCER CENTER ONLY)
ALT: 27 U/L (ref 0–44)
AST: 22 U/L (ref 15–41)
Albumin: 4.2 g/dL (ref 3.5–5.0)
Alkaline Phosphatase: 77 U/L (ref 38–126)
Anion gap: 9 (ref 5–15)
BUN: 16 mg/dL (ref 8–23)
CO2: 27 mmol/L (ref 22–32)
Calcium: 9.4 mg/dL (ref 8.9–10.3)
Chloride: 105 mmol/L (ref 98–111)
Creatinine: 0.68 mg/dL (ref 0.44–1.00)
GFR, Estimated: >60 mL/min (ref >60)
Glucose, Bld: 113 mg/dL — ABNORMAL HIGH (ref 70–99)
Potassium: 3.9 mmol/L (ref 3.5–5.1)
Sodium: 141 mmol/L (ref 135–145)
Total Bilirubin: 0.4 mg/dL (ref 0.3–1.2)
Total Protein: 6.3 g/dL — ABNORMAL LOW (ref 6.5–8.1)

## 2021-08-12 LAB — CBC WITH DIFFERENTIAL (CANCER CENTER ONLY)
Abs Immature Granulocytes: 0.01 10*3/uL (ref 0.00–0.07)
Basophils Absolute: 0 10*3/uL (ref 0.0–0.1)
Basophils Relative: 0 %
Eosinophils Absolute: 0 10*3/uL (ref 0.0–0.5)
Eosinophils Relative: 1 %
HCT: 35.7 % — ABNORMAL LOW (ref 36.0–46.0)
Hemoglobin: 11.9 g/dL — ABNORMAL LOW (ref 12.0–15.0)
Immature Granulocytes: 0 %
Lymphocytes Relative: 27 %
Lymphs Abs: 0.9 10*3/uL (ref 0.7–4.0)
MCH: 31 pg (ref 26.0–34.0)
MCHC: 33.3 g/dL (ref 30.0–36.0)
MCV: 93 fL (ref 80.0–100.0)
Monocytes Absolute: 0.3 10*3/uL (ref 0.1–1.0)
Monocytes Relative: 9 %
Neutro Abs: 2 10*3/uL (ref 1.7–7.7)
Neutrophils Relative %: 63 %
Platelet Count: 222 10*3/uL (ref 150–400)
RBC: 3.84 MIL/uL — ABNORMAL LOW (ref 3.87–5.11)
RDW: 13.2 % (ref 11.5–15.5)
WBC Count: 3.2 10*3/uL — ABNORMAL LOW (ref 4.0–10.5)
nRBC: 0 % (ref 0.0–0.2)

## 2021-08-12 LAB — D-DIMER, QUANTITATIVE: D-Dimer, Quant: <0.27 ug/mL-FEU (ref 0.00–0.50)

## 2021-08-12 LAB — LACTATE DEHYDROGENASE: LDH: 146 U/L (ref 98–192)

## 2021-08-12 MED ORDER — SODIUM CHLORIDE 0.9% FLUSH
10.0000 mL | Freq: Once | INTRAVENOUS | Status: AC
  Administered 2021-08-12: 10 mL via INTRAVENOUS

## 2021-08-12 MED ORDER — HEPARIN SOD (PORK) LOCK FLUSH 100 UNIT/ML IV SOLN
500.0000 [IU] | Freq: Once | INTRAVENOUS | Status: AC
  Administered 2021-08-12: 500 [IU] via INTRAVENOUS

## 2021-08-12 MED ORDER — FONDAPARINUX SODIUM 5 MG/0.4ML ~~LOC~~ SOLN: 2.5000 mg | SUBCUTANEOUS | 6 refills | Status: DC

## 2021-08-12 NOTE — Progress Notes (Signed)
?Hematology and Oncology Follow Up Visit ? ?Sherry Williams ?[id number] ?[birth year] 61 y.o. ?08/12/2021 ? ? 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 ?Current Therapy:   ?Carboplatin/paclitaxel-status post cycle #3-started on 02/24/2020 Arixtra 2.5 mg sq q day -- started on 09/30/2019 -- changed on 08/12/2021 ?    ?Interim History:  Sherry Williams is back for follow-up.  We saw her 2 months ago.  Since then, she been doing quite well.  She has been very active.  She plays pickle ball.  She walks.  I am really not surprised by her level of activity.  Before we do start her on treatments, she was incredibly active. ? ?We did do a PET scan on her.  This was done on 08/09/2021.  PET scan showed no evidence of active metastatic or recurrent cancer. ? ?We saw her back in January, her CA-125 was 17. ? ?She has had no problems with cough or shortness of breath.  There is been no nausea or vomiting.  She has had no change in bowel or bladder habits. ? ?I think we can try to decrease her Arixtra dose down to 2.5 mg a day for chronic thromboembolic disease treatment.  I think with the PET scan being negative, we had elected for a of decreasing her dose a little bit. ? ?She has had no leg swelling.  She has had no bleeding.  There is been no headache. ? ?Overall, I would say performance status is ECOG 0.     ? ?Medications:  ?Current Outpatient Medications:  ?  ARMOUR THYROID 15 MG tablet, Take 15 mg by mouth every morning., Disp: , Rfl:  ?  ASPIRIN 81 PO, Take by mouth daily., Disp: , Rfl:  ?  atorvastatin (LIPITOR) 40 MG tablet, , Disp: , Rfl:  ?  fondaparinux (ARIXTRA) 5 MG/0.4ML SOLN injection, Inject 0.2 mLs (2.5 mg total) into the skin daily., Disp: 12 mL, Rfl: 6 ?  lidocaine-prilocaine (EMLA) cream, APPLY TOPICALLY TO THE AFFECTED AREA 1 TIME, Disp: 30 g, Rfl: 3 ?  thyroid (ARMOUR) 60 MG tablet, Take 60 mg  by mouth daily., Disp: , Rfl:  ? ?Allergies:  ?Allergies  ?Allergen Reactions  ? Bee Venom Anaphylaxis and Swelling  ? Adhesive [Tape] Other (See Comments)  ?  Irritation and red  ? Aspartame Diarrhea  ? Aspartame And Phenylalanine Diarrhea  ? ? ?Past Medical History, Surgical history, Social history, and Family History were reviewed and updated. ? ?Review of Systems: ?Review of Systems  ?Constitutional: Negative.   ?HENT: Negative.    ?Eyes: Negative.   ?Respiratory: Negative.    ?Cardiovascular: Negative.   ?Gastrointestinal:  Positive for abdominal pain.  ?Genitourinary: Negative.   ?Musculoskeletal: Negative.   ?Skin: Negative.   ?Neurological: Negative.   ?Endo/Heme/Allergies: Negative.   ?Psychiatric/Behavioral: Negative.    ? ?Physical Exam: ? weight is 130 lb (59 kg). Her oral temperature is 98 ?F (36.7 ?C). Her blood pressure is 107/50 (abnormal) and her pulse is 58 (abnormal). Her respiration is 18 and oxygen saturation is 100%.  ? ?Wt Readings from Last 3 Encounters:  ?08/12/21 130 lb (59 kg)  ?06/16/21 131 lb (59.4 kg)  ?04/04/21 126 lb (57.2 kg)  ? ? ?Physical Exam ?Vitals reviewed.  ?HENT:  ?   Head: Normocephalic and atraumatic.  ?Eyes:  ?   Pupils: Pupils are equal, round, and reactive to light.  ?  Cardiovascular:  ?   Rate and Rhythm: Normal rate and regular rhythm.  ?   Heart sounds: Normal heart sounds.  ?Pulmonary:  ?   Effort: Pulmonary effort is normal.  ?   Breath sounds: Normal breath sounds.  ?Abdominal:  ?   General: Bowel sounds are normal.  ?   Palpations: Abdomen is soft.  ?   Comments: Abdominal exam shows a soft abdomen.  She has well healing laparoscopic scars.  I think there are 5 laparoscopic scars.  She has no erythema or exudate or warmth associated with these.  She has no fluid wave in the abdomen.  There is no guarding or rebound tenderness.  She has no palpable liver or spleen tip.  ?Musculoskeletal:     ?   General: No tenderness or deformity. Normal range of motion.  ?    Cervical back: Normal range of motion.  ?Lymphadenopathy:  ?   Cervical: No cervical adenopathy.  ?Skin: ?   General: Skin is warm and dry.  ?   Findings: No erythema or rash.  ?Neurological:  ?   Mental Status: She is alert and oriented to person, place, and time.  ?Psychiatric:     ?   Behavior: Behavior normal.     ?   Thought Content: Thought content normal.     ?   Judgment: Judgment normal.  ? ? ? ?Lab Results  ?Component Value Date  ? WBC 3.2 (L) 08/12/2021  ? HGB 11.9 (L) 08/12/2021  ? HCT 35.7 (L) 08/12/2021  ? MCV 93.0 08/12/2021  ? PLT 222 08/12/2021  ? ?  Chemistry   ?   ?Component Value Date/Time  ? NA 141 08/12/2021 1327  ? NA 142 02/15/2017 1518  ? NA 140 04/13/2016 1055  ? K 3.9 08/12/2021 1327  ? K 4.1 02/15/2017 1518  ? K 4.1 04/13/2016 1055  ? CL 105 08/12/2021 1327  ? CL 107 02/15/2017 1518  ? CO2 27 08/12/2021 1327  ? CO2 29 02/15/2017 1518  ? CO2 27 04/13/2016 1055  ? BUN 16 08/12/2021 1327  ? BUN 17 02/15/2017 1518  ? BUN 22.1 04/13/2016 1055  ? CREATININE 0.68 08/12/2021 1327  ? CREATININE 1.0 02/15/2017 1518  ? CREATININE 0.8 04/13/2016 1055  ?    ?Component Value Date/Time  ? CALCIUM 9.4 08/12/2021 1327  ? CALCIUM 9.3 02/15/2017 1518  ? CALCIUM 9.9 04/13/2016 1055  ? ALKPHOS 77 08/12/2021 1327  ? ALKPHOS 79 02/15/2017 1518  ? ALKPHOS 81 04/13/2016 1055  ? AST 22 08/12/2021 1327  ? AST 20 04/13/2016 1055  ? ALT 27 08/12/2021 1327  ? ALT 24 02/15/2017 1518  ? ALT 22 04/13/2016 1055  ? BILITOT 0.4 08/12/2021 1327  ? BILITOT 0.44 04/13/2016 1055  ?  ? ? ?Impression and Plan: ?Sherry Williams is a 61 year old Caucasian female.  She has had issues with recurrence of thromboembolic events.  She has had these systemically and in the brain. ? ?We then found that she had relapse of the uterine cancer.  She has had systemic chemotherapy for this.  She has gone into remission. ? ?I am so glad about the PET scan.  Again, what was seen last time concerning had been more reactive changes. ? ?We will have to  see what the CA-125 shows. ? ?I think we can wait 4 months now.  I think this would be very reasonable. ? ?Again, we are going to decrease the Arixtra down to 2.5 mg subcu  daily. ? ?If there are any issues between now and 4 months, we will be more than happy to see her back. ? ? ?[name], MD ?3/3/20232:18 PM  ? ? ? ? ?

## 2021-08-13 LAB — CA 125: Cancer Antigen (CA) 125: 18.5 U/mL (ref 0.0–38.1)

## 2021-08-15 ENCOUNTER — Encounter: Payer: Self-pay | Admitting: *Deleted

## 2021-08-16 ENCOUNTER — Other Ambulatory Visit: Payer: Self-pay | Admitting: *Deleted

## 2021-08-16 DIAGNOSIS — I82401 Acute embolism and thrombosis of unspecified deep veins of right lower extremity: Secondary | ICD-10-CM

## 2021-08-16 DIAGNOSIS — Z7901 Long term (current) use of anticoagulants: Secondary | ICD-10-CM

## 2021-08-16 DIAGNOSIS — I63019 Cerebral infarction due to thrombosis of unspecified vertebral artery: Secondary | ICD-10-CM

## 2021-08-16 MED ORDER — FONDAPARINUX SODIUM 2.5 MG/0.5ML ~~LOC~~ SOLN: 2.5000 mg | SUBCUTANEOUS | 6 refills | Status: DC

## 2021-08-17 ENCOUNTER — Telehealth: Payer: Self-pay | Admitting: *Deleted

## 2021-08-17 NOTE — Telephone Encounter (Signed)
Faxed urgent medical records request from [place] to [facility] @ [phone] and mailed documents through intraoffice mail to [email or web address] ?

## 2021-08-18 ENCOUNTER — Inpatient Hospital Stay: Payer: 59

## 2021-08-18 ENCOUNTER — Other Ambulatory Visit: Payer: 59

## 2021-08-18 ENCOUNTER — Ambulatory Visit: Payer: 59 | Admitting: Hematology & Oncology

## 2021-10-11 ENCOUNTER — Other Ambulatory Visit: Payer: Self-pay | Admitting: Pharmacist

## 2021-10-25 ENCOUNTER — Encounter: Payer: Self-pay | Admitting: Hematology & Oncology

## 2021-11-21 ENCOUNTER — Encounter: Payer: Self-pay | Admitting: Hematology & Oncology

## 2021-12-23 ENCOUNTER — Ambulatory Visit (HOSPITAL_COMMUNITY)
Admission: RE | Admit: 2021-12-23 | Discharge: 2021-12-23 | Disposition: A | Discharge status: Home / Self Care | Payer: 59 | Source: Ambulatory Visit | Attending: Hematology & Oncology | Admitting: Hematology & Oncology

## 2021-12-23 DIAGNOSIS — C541 Malignant neoplasm of endometrium: Secondary | ICD-10-CM | POA: Diagnosis present

## 2021-12-23 LAB — GLUCOSE, CAPILLARY: Glucose-Capillary: 88 mg/dL (ref 70–99)

## 2021-12-23 MED ORDER — FLUDEOXYGLUCOSE F - 18 (FDG) INJECTION
6.5000 | Freq: Once | INTRAVENOUS | Status: AC
  Administered 2021-12-23: 6.4 via INTRAVENOUS

## 2021-12-26 ENCOUNTER — Encounter: Payer: Self-pay | Admitting: *Deleted

## 2021-12-26 ENCOUNTER — Inpatient Hospital Stay: Payer: 59

## 2021-12-26 ENCOUNTER — Inpatient Hospital Stay: Payer: 59 | Attending: Hematology & Oncology

## 2021-12-26 ENCOUNTER — Other Ambulatory Visit: Payer: Self-pay

## 2021-12-26 ENCOUNTER — Inpatient Hospital Stay (HOSPITAL_BASED_OUTPATIENT_CLINIC_OR_DEPARTMENT_OTHER): Payer: 59 | Admitting: Hematology & Oncology

## 2021-12-26 ENCOUNTER — Encounter: Payer: Self-pay | Admitting: Hematology & Oncology

## 2021-12-26 ENCOUNTER — Other Ambulatory Visit: Payer: Self-pay | Admitting: *Deleted

## 2021-12-26 VITALS — BP 117/65 | HR 59 | Temp 97.8°F | Resp 18 | Wt 129.0 lb

## 2021-12-26 DIAGNOSIS — Z86718 Personal history of other venous thrombosis and embolism: Secondary | ICD-10-CM | POA: Insufficient documentation

## 2021-12-26 DIAGNOSIS — Z7901 Long term (current) use of anticoagulants: Secondary | ICD-10-CM | POA: Insufficient documentation

## 2021-12-26 DIAGNOSIS — Z8542 Personal history of malignant neoplasm of other parts of uterus: Secondary | ICD-10-CM | POA: Diagnosis present

## 2021-12-26 DIAGNOSIS — C541 Malignant neoplasm of endometrium: Secondary | ICD-10-CM | POA: Diagnosis not present

## 2021-12-26 DIAGNOSIS — C55 Malignant neoplasm of uterus, part unspecified: Secondary | ICD-10-CM | POA: Insufficient documentation

## 2021-12-26 DIAGNOSIS — R971 Elevated cancer antigen 125 [CA 125]: Secondary | ICD-10-CM | POA: Diagnosis not present

## 2021-12-26 DIAGNOSIS — I82401 Acute embolism and thrombosis of unspecified deep veins of right lower extremity: Secondary | ICD-10-CM

## 2021-12-26 LAB — CBC WITH DIFFERENTIAL (CANCER CENTER ONLY)
Abs Immature Granulocytes: 0 10*3/uL (ref 0.00–0.07)
Basophils Absolute: 0 10*3/uL (ref 0.0–0.1)
Basophils Relative: 1 %
Eosinophils Absolute: 0 10*3/uL (ref 0.0–0.5)
Eosinophils Relative: 2 %
HCT: 35.8 % — ABNORMAL LOW (ref 36.0–46.0)
Hemoglobin: 11.9 g/dL — ABNORMAL LOW (ref 12.0–15.0)
Immature Granulocytes: 0 %
Lymphocytes Relative: 28 %
Lymphs Abs: 0.7 10*3/uL (ref 0.7–4.0)
MCH: 30.8 pg (ref 26.0–34.0)
MCHC: 33.2 g/dL (ref 30.0–36.0)
MCV: 92.7 fL (ref 80.0–100.0)
Monocytes Absolute: 0.5 10*3/uL (ref 0.1–1.0)
Monocytes Relative: 18 %
Neutro Abs: 1.3 10*3/uL — ABNORMAL LOW (ref 1.7–7.7)
Neutrophils Relative %: 51 %
Platelet Count: 211 10*3/uL (ref 150–400)
RBC: 3.86 MIL/uL — ABNORMAL LOW (ref 3.87–5.11)
RDW: 13 % (ref 11.5–15.5)
WBC Count: 2.6 10*3/uL — ABNORMAL LOW (ref 4.0–10.5)
nRBC: 0 % (ref 0.0–0.2)

## 2021-12-26 LAB — CMP (CANCER CENTER ONLY)
ALT: 19 U/L (ref 0–44)
AST: 19 U/L (ref 15–41)
Albumin: 4.8 g/dL (ref 3.5–5.0)
Alkaline Phosphatase: 67 U/L (ref 38–126)
Anion gap: 9 (ref 5–15)
BUN: 13 mg/dL (ref 8–23)
CO2: 25 mmol/L (ref 22–32)
Calcium: 9.5 mg/dL (ref 8.9–10.3)
Chloride: 102 mmol/L (ref 98–111)
Creatinine: 0.63 mg/dL (ref 0.44–1.00)
GFR, Estimated: >60 mL/min (ref >60)
Glucose, Bld: 83 mg/dL (ref 70–99)
Potassium: 4 mmol/L (ref 3.5–5.1)
Sodium: 136 mmol/L (ref 135–145)
Total Bilirubin: 0.5 mg/dL (ref 0.3–1.2)
Total Protein: 6.8 g/dL (ref 6.5–8.1)

## 2021-12-26 LAB — D-DIMER, QUANTITATIVE: D-Dimer, Quant: <0.27 ug/mL-FEU (ref 0.00–0.50)

## 2021-12-26 LAB — LACTATE DEHYDROGENASE: LDH: 167 U/L (ref 98–192)

## 2021-12-26 NOTE — Progress Notes (Signed)
Hematology and Oncology Follow Up Visit  Sherry Williams [id number] Jan 04, 1961 61 y.o. 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  Unfortunately, looks like we have an issue right now.  She did have a PET scan that was done.  This was done on the July 14.  This did seem to show that she now has recurrent disease.  She has multiple areas of activity in the retroperitoneum and peritoneal cavity.  These are suspicious for an peritoneal carcinomatosis and possible retroperitoneal nodal metastasis.  There is no visceral organ involvement or bone involvement.  Her last CA-125 was slowly rising.  It was 18.5.  She is not having any abdominal pain.  There is no change in bowel or bladder habits.  She has had no problems with respect to the [place].  Her D-dimer was normal.  I think the issue now is how to try to help with his recurrence.  I think 1 interesting possibility would to be to consider it would be intraperitoneal chemotherapy.  I just wonder if she would be a candidate for the aggressive HIPEC protocol.  I will have to speak with Sherry Williams at [facility] who does this.  Again she does not have a lot of bulky disease.  I would think that intraperitoneal therapy would be a reasonable option for her.  She would like to try to avoid systemic therapy.  She has had systemic therapy.  She also is very interested in complementary type of therapy.  She likes to utilize alternative therapies.  I think she is gotten vitamin C in the past.  I think she is going out to a clinic in [place] for treatment.  She is still in great shape.  She walks every  day.  She has had no cough or shortness of breath.  She has had no headache.  There is been no change in bowel or bladder habits.  Overall, I would say performance status is probably ECOG 0.    Medications:  Current Outpatient Medications:    ARMOUR THYROID 15 MG tablet, Take 15 mg by mouth every morning., Disp: , Rfl:    ASPIRIN 81 PO, Take by mouth daily., Disp: , Rfl:    atorvastatin (LIPITOR) 40 MG tablet, , Disp: , Rfl:    chlorhexidine (PERIDEX) 0.12 % solution, 15 mLs 2 (two) times daily., Disp: , Rfl:    fondaparinux (ARIXTRA) 2.5 MG/0.5ML SOLN injection, Inject 0.5 mLs (2.5 mg total) into the skin daily., Disp: 15 mL, Rfl: 6   lidocaine-prilocaine (EMLA) cream, Apply topically once., Disp: , Rfl:    thyroid (ARMOUR) 60 MG tablet, Take 60 mg by mouth daily., Disp: , Rfl:   Allergies:  Allergies  Allergen Reactions   Bee Venom Anaphylaxis and Swelling   Adhesive [Tape] Other (See Comments)    Irritation and red   Aspartame Diarrhea   Aspartame And Phenylalanine Diarrhea    Past Medical History, Surgical history, Social history, and Family History were reviewed and updated.  Review of Systems: Review of Systems  Constitutional: Negative.   HENT: Negative.    Eyes: Negative.   Respiratory: Negative.  Cardiovascular: Negative.   Gastrointestinal:  Positive for abdominal pain.  Genitourinary: Negative.   Musculoskeletal: Negative.   Skin: Negative.   Neurological: Negative.   Endo/Heme/Allergies: Negative.   Psychiatric/Behavioral: Negative.      Physical Exam:  weight is 129 lb (58.5 kg). Her oral temperature is 97.8 F (36.6 C). Her blood pressure is 117/65 and her pulse is 59 (abnormal). Her respiration is 18 and oxygen saturation is 100%.   Wt Readings from Last 3 Encounters:  12/26/21 129 lb (58.5 kg)  08/12/21 130 lb (59 kg)  06/16/21 131 lb (59.4 kg)    Physical Exam Vitals reviewed.  HENT:     Head: Normocephalic and atraumatic.  Eyes:      Pupils: Pupils are equal, round, and reactive to light.  Cardiovascular:     Rate and Rhythm: Normal rate and regular rhythm.     Heart sounds: Normal heart sounds.  Pulmonary:     Effort: Pulmonary effort is normal.     Breath sounds: Normal breath sounds.  Abdominal:     General: Bowel sounds are normal.     Palpations: Abdomen is soft.     Comments: Abdominal exam shows a soft abdomen.  She has well healing laparoscopic scars.  I think there are 5 laparoscopic scars.  She has no erythema or exudate or warmth associated with these.  She has no fluid wave in the abdomen.  There is no guarding or rebound tenderness.  She has no palpable liver or spleen tip.  Musculoskeletal:        General: No tenderness or deformity. Normal range of motion.     Cervical back: Normal range of motion.  Lymphadenopathy:     Cervical: No cervical adenopathy.  Skin:    General: Skin is warm and dry.     Findings: No erythema or rash.  Neurological:     Mental Status: She is alert and oriented to person, place, and time.  Psychiatric:        Behavior: Behavior normal.        Thought Content: Thought content normal.        Judgment: Judgment normal.      Lab Results  Component Value Date   WBC 2.6 (L) 12/26/2021   HGB 11.9 (L) 12/26/2021   HCT 35.8 (L) 12/26/2021   MCV 92.7 12/26/2021   PLT 211 12/26/2021     Chemistry      Component Value Date/Time   NA 136 12/26/2021 0935   NA 142 02/15/2017 1518   NA 140 04/13/2016 1055   K 4.0 12/26/2021 0935   K 4.1 02/15/2017 1518   K 4.1 04/13/2016 1055   CL 102 12/26/2021 0935   CL 107 02/15/2017 1518   CO2 25 12/26/2021 0935   CO2 29 02/15/2017 1518   CO2 27 04/13/2016 1055   BUN 13 12/26/2021 0935   BUN 17 02/15/2017 1518   BUN 22.1 04/13/2016 1055   CREATININE 0.63 12/26/2021 0935   CREATININE 1.0 02/15/2017 1518   CREATININE 0.8 04/13/2016 1055      Component Value Date/Time   CALCIUM 9.5 12/26/2021 0935   CALCIUM 9.3 02/15/2017 1518    CALCIUM 9.9 04/13/2016 1055   ALKPHOS 67 12/26/2021 0935   ALKPHOS 79 02/15/2017 1518   ALKPHOS 81 04/13/2016 1055   AST 19 12/26/2021 0935   AST 20 04/13/2016 1055   ALT 19 12/26/2021 0935   ALT 24 02/15/2017 1518   ALT 22 04/13/2016 1055  BILITOT 0.5 12/26/2021 0935   BILITOT 0.44 04/13/2016 1055      Impression and Plan: Ms. Vandervoort is a 61 year old Caucasian female.  She has had issues with recurrence of thromboembolic events.  She has had these systemically and in the brain.  We then found that she had relapse of the uterine cancer.  She has had systemic chemotherapy for this.  She had gone into remission.  I think that there is clearly evidence of recurrence on the PET scan.  Again, she does not have any parenchymal organ involvement.  I will have to see if she would be a candidate for intraperitoneal chemotherapy.  Again, I am unsure if the HIPEC would be the way to go.  Again I will have to speak with Sherry Williams at [facility] about this.  I think she is going to want to think about everything.  I know she and her husband are planning on going out to [facility] for a month.  This will be the end of August.  I think she really wants to make this trip.  Again she is very interested in alternative therapies.  Some of these have not heard of.  I know she is done a lot of research.  I know this is a very complicated.  She still has a very good performance status and really can handle incredibly aggressive therapy.  I think we could do a "local" therapy, I think this would probably be a very effective.  We will plan to see her back depending on what happens out at [facility].  I will be very interested to see what the CA-125 level is.   [name], MD 7/17/20234:24 PM

## 2021-12-26 NOTE — Progress Notes (Signed)
Pt brought in completed form with the Nagalase test. Pt stated Dr Marin Olp ok's having these labs drawn here. Labs drawn in provided tube and placed in freezer until pt completes OV. Pt advised to take tubes to [employer] for drop off in provided bag.

## 2021-12-26 NOTE — Patient Instructions (Signed)

## 2021-12-28 ENCOUNTER — Telehealth: Payer: Self-pay | Admitting: *Deleted

## 2021-12-28 ENCOUNTER — Encounter: Payer: Self-pay | Admitting: Hematology & Oncology

## 2021-12-28 LAB — CA 125: Cancer Antigen (CA) 125: 58.7 U/mL — ABNORMAL HIGH (ref 0.0–38.1)

## 2021-12-28 NOTE — Telephone Encounter (Signed)
Per 12/28/21 los - I will do follow-up with her once I know what happens with the consult out of [facility]

## 2022-01-05 ENCOUNTER — Other Ambulatory Visit: Payer: Self-pay | Admitting: Hematology & Oncology

## 2022-01-10 ENCOUNTER — Telehealth: Payer: Self-pay

## 2022-01-10 NOTE — Telephone Encounter (Signed)
Article from [place] [facility] medicine sent to patient via my-chart per MD.

## 2022-02-03 ENCOUNTER — Encounter: Payer: Self-pay | Admitting: Hematology & Oncology

## 2022-02-16 ENCOUNTER — Other Ambulatory Visit: Payer: Self-pay | Admitting: Hematology & Oncology

## 2022-02-16 DIAGNOSIS — C541 Malignant neoplasm of endometrium: Secondary | ICD-10-CM

## 2022-02-16 NOTE — Progress Notes (Signed)
n

## 2022-04-17 ENCOUNTER — Other Ambulatory Visit: Payer: Self-pay | Admitting: Hematology & Oncology

## 2022-04-17 DIAGNOSIS — I63019 Cerebral infarction due to thrombosis of unspecified vertebral artery: Secondary | ICD-10-CM

## 2022-04-17 DIAGNOSIS — I82401 Acute embolism and thrombosis of unspecified deep veins of right lower extremity: Secondary | ICD-10-CM

## 2022-04-17 DIAGNOSIS — Z7901 Long term (current) use of anticoagulants: Secondary | ICD-10-CM

## 2022-04-19 ENCOUNTER — Encounter: Payer: Self-pay | Admitting: Hematology & Oncology

## 2022-04-24 ENCOUNTER — Encounter (HOSPITAL_COMMUNITY)
Admission: RE | Admit: 2022-04-24 | Discharge: 2022-04-24 | Disposition: A | Discharge status: Home / Self Care | Payer: Medicare Other | Source: Ambulatory Visit | Attending: Hematology & Oncology | Admitting: Hematology & Oncology

## 2022-04-24 ENCOUNTER — Other Ambulatory Visit: Payer: Self-pay

## 2022-04-24 DIAGNOSIS — R590 Localized enlarged lymph nodes: Secondary | ICD-10-CM | POA: Diagnosis not present

## 2022-04-24 DIAGNOSIS — C541 Malignant neoplasm of endometrium: Secondary | ICD-10-CM

## 2022-04-24 LAB — GLUCOSE, CAPILLARY: Glucose-Capillary: 67 mg/dL — ABNORMAL LOW (ref 70–99)

## 2022-04-24 MED ORDER — LIDOCAINE HCL 1 % IJ SOLN
INTRAMUSCULAR | Status: AC
  Filled 2022-04-24: qty 20

## 2022-04-24 MED ORDER — FLUDEOXYGLUCOSE F - 18 (FDG) INJECTION
6.4000 | Freq: Once | INTRAVENOUS | Status: AC
  Administered 2022-04-24: 6.14 via INTRAVENOUS

## 2022-04-25 ENCOUNTER — Inpatient Hospital Stay: Payer: Medicare Other | Attending: Hematology & Oncology

## 2022-04-25 ENCOUNTER — Inpatient Hospital Stay: Payer: Medicare Other

## 2022-04-25 ENCOUNTER — Inpatient Hospital Stay: Payer: Medicare Other | Admitting: Hematology & Oncology

## 2022-04-25 ENCOUNTER — Encounter: Payer: Self-pay | Admitting: Hematology & Oncology

## 2022-04-25 ENCOUNTER — Other Ambulatory Visit: Payer: Self-pay

## 2022-04-25 VITALS — BP 106/55 | HR 62 | Temp 98.3°F | Resp 17 | Ht 66.0 in | Wt 127.0 lb

## 2022-04-25 DIAGNOSIS — Z86718 Personal history of other venous thrombosis and embolism: Secondary | ICD-10-CM | POA: Insufficient documentation

## 2022-04-25 DIAGNOSIS — C55 Malignant neoplasm of uterus, part unspecified: Secondary | ICD-10-CM | POA: Diagnosis present

## 2022-04-25 DIAGNOSIS — C541 Malignant neoplasm of endometrium: Secondary | ICD-10-CM

## 2022-04-25 DIAGNOSIS — Z95828 Presence of other vascular implants and grafts: Secondary | ICD-10-CM

## 2022-04-25 DIAGNOSIS — Z7901 Long term (current) use of anticoagulants: Secondary | ICD-10-CM | POA: Diagnosis not present

## 2022-04-25 DIAGNOSIS — R971 Elevated cancer antigen 125 [CA 125]: Secondary | ICD-10-CM | POA: Diagnosis not present

## 2022-04-25 LAB — CBC WITH DIFFERENTIAL (CANCER CENTER ONLY)
Abs Immature Granulocytes: 0.01 10*3/uL (ref 0.00–0.07)
Basophils Absolute: 0 10*3/uL (ref 0.0–0.1)
Basophils Relative: 1 %
Eosinophils Absolute: 0.2 10*3/uL (ref 0.0–0.5)
Eosinophils Relative: 5 %
HCT: 33.3 % — ABNORMAL LOW (ref 36.0–46.0)
Hemoglobin: 10.9 g/dL — ABNORMAL LOW (ref 12.0–15.0)
Immature Granulocytes: 0 %
Lymphocytes Relative: 27 %
Lymphs Abs: 0.9 10*3/uL (ref 0.7–4.0)
MCH: 30.2 pg (ref 26.0–34.0)
MCHC: 32.7 g/dL (ref 30.0–36.0)
MCV: 92.2 fL (ref 80.0–100.0)
Monocytes Absolute: 0.4 10*3/uL (ref 0.1–1.0)
Monocytes Relative: 11 %
Neutro Abs: 1.8 10*3/uL (ref 1.7–7.7)
Neutrophils Relative %: 56 %
Platelet Count: 245 10*3/uL (ref 150–400)
RBC: 3.61 MIL/uL — ABNORMAL LOW (ref 3.87–5.11)
RDW: 13.8 % (ref 11.5–15.5)
WBC Count: 3.3 10*3/uL — ABNORMAL LOW (ref 4.0–10.5)
nRBC: 0 % (ref 0.0–0.2)

## 2022-04-25 LAB — CMP (CANCER CENTER ONLY)
ALT: 22 U/L (ref 0–44)
AST: 21 U/L (ref 15–41)
Albumin: 4.6 g/dL (ref 3.5–5.0)
Alkaline Phosphatase: 56 U/L (ref 38–126)
Anion gap: 8 (ref 5–15)
BUN: 14 mg/dL (ref 8–23)
CO2: 28 mmol/L (ref 22–32)
Calcium: 9.5 mg/dL (ref 8.9–10.3)
Chloride: 104 mmol/L (ref 98–111)
Creatinine: 0.72 mg/dL (ref 0.44–1.00)
GFR, Estimated: >60 mL/min (ref >60)
Glucose, Bld: 92 mg/dL (ref 70–99)
Potassium: 4.1 mmol/L (ref 3.5–5.1)
Sodium: 140 mmol/L (ref 135–145)
Total Bilirubin: 0.5 mg/dL (ref 0.3–1.2)
Total Protein: 6.6 g/dL (ref 6.5–8.1)

## 2022-04-25 LAB — D-DIMER, QUANTITATIVE: D-Dimer, Quant: 0.73 ug/mL-FEU — ABNORMAL HIGH (ref 0.00–0.50)

## 2022-04-25 LAB — LACTATE DEHYDROGENASE: LDH: 155 U/L (ref 98–192)

## 2022-04-25 MED ORDER — HEPARIN SOD (PORK) LOCK FLUSH 100 UNIT/ML IV SOLN
500.0000 [IU] | Freq: Once | INTRAVENOUS | Status: AC
  Administered 2022-04-25: 500 [IU] via INTRAVENOUS

## 2022-04-25 MED ORDER — SODIUM CHLORIDE 0.9% FLUSH
10.0000 mL | INTRAVENOUS | Status: DC | PRN
  Administered 2022-04-25: 10 mL via INTRAVENOUS

## 2022-04-25 NOTE — Patient Instructions (Signed)

## 2022-04-25 NOTE — Progress Notes (Signed)
Hematology and Oncology Follow Up Visit  Sherry Williams [id number] June 09, 1961 61 y.o. 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  Unfortunately, looks like we have an issue right now.  She did have a PET scan that was done.  This was done on the July 14.  This did seem to show that she now has recurrent disease.  She has multiple areas of activity in the retroperitoneum and peritoneal cavity.  These are suspicious for an peritoneal carcinomatosis and possible retroperitoneal nodal metastasis.  There is no visceral organ involvement or bone involvement.  Her last CA-125 was slowly rising.  It was 18.5.  She is not having any abdominal pain.  There is no change in bowel or bladder habits.  She has had no problems with respect to the [place].  Her D-dimer was normal.  I think the issue now is how to try to help with his recurrence.  I think 1 interesting possibility would to be to consider it would be intraperitoneal chemotherapy.  I just wonder if she would be a candidate for the aggressive HIPEC protocol.  I will have to speak with Dr. Crisoforo Oxford at [facility] who does this.  Again she does not have a lot of bulky disease.  I would think that intraperitoneal therapy would be a reasonable option for her.  She would like to try to avoid systemic therapy.  She has had systemic therapy.  She also is very interested in complementary type of therapy.  She likes to utilize alternative therapies.  I think she is gotten vitamin C in the past.  I think she is going out to a clinic in [place] for treatment.  She is still in great shape.  She walks every  day.  She has had no cough or shortness of breath.  She has had no headache.  There is been no change in bowel or bladder habits.  Overall, I would say performance status is probably ECOG 0.    Medications:  Current Outpatient Medications:  .  fondaparinux (ARIXTRA) 5 MG/0.4ML SOLN injection, Inject into the skin., Disp: , Rfl:  .  ARMOUR THYROID 15 MG tablet, Take 15 mg by mouth every morning., Disp: , Rfl:  .  aspirin 81 MG chewable tablet, Chew 1 tablet by mouth daily., Disp: , Rfl:  .  atorvastatin (LIPITOR) 40 MG tablet, , Disp: , Rfl:  .  chlorhexidine (PERIDEX) 0.12 % solution, 15 mLs 2 (two) times daily., Disp: , Rfl:  .  fondaparinux (ARIXTRA) 2.5 MG/0.5ML SOLN injection, INJECT 2.5 MG INTO THE SKIN DAILY, Disp: 15 mL, Rfl: 6 .  lidocaine-prilocaine (EMLA) cream, APPLY TOPICALLY TO THE AFFECTED AREA 1 TIME, Disp: 30 g, Rfl: 4 .  thyroid (ARMOUR) 60 MG tablet, Take 60 mg by mouth daily., Disp: , Rfl:   Allergies:  Allergies  Allergen Reactions  . Bee Venom Anaphylaxis, Swelling and Other (See Comments)  . Adhesive [Tape] Other (See Comments)    Irritation and red  . Aspartame Diarrhea  . Aspartame And Phenylalanine Diarrhea    Past Medical History, Surgical history, Social history, and Family History were reviewed and updated.  Review of Systems: Review of Systems  Constitutional: Negative.   HENT: Negative.    Eyes: Negative.   Respiratory: Negative.    Cardiovascular: Negative.   Gastrointestinal:  Positive for abdominal pain.  Genitourinary: Negative.   Musculoskeletal: Negative.   Skin: Negative.   Neurological: Negative.   Endo/Heme/Allergies: Negative.   Psychiatric/Behavioral: Negative.      Physical Exam:  height is _0  (1.676 m) and weight is 127 lb (57.6 kg). Her oral temperature is 98.3 F (36.8 C). Her blood pressure is 106/55 (abnormal) and her pulse is 62. Her respiration is 17 and oxygen saturation is 99%.   Wt Readings from Last 3 Encounters:   04/25/22 127 lb (57.6 kg)  04/24/22 125 lb (56.7 kg)  12/26/21 129 lb (58.5 kg)    Physical Exam Vitals reviewed.  HENT:     Head: Normocephalic and atraumatic.  Eyes:     Pupils: Pupils are equal, round, and reactive to light.  Cardiovascular:     Rate and Rhythm: Normal rate and regular rhythm.     Heart sounds: Normal heart sounds.  Pulmonary:     Effort: Pulmonary effort is normal.     Breath sounds: Normal breath sounds.  Abdominal:     General: Bowel sounds are normal.     Palpations: Abdomen is soft.     Comments: Abdominal exam shows a soft abdomen.  She has well healing laparoscopic scars.  I think there are 5 laparoscopic scars.  She has no erythema or exudate or warmth associated with these.  She has no fluid wave in the abdomen.  There is no guarding or rebound tenderness.  She has no palpable liver or spleen tip.  Musculoskeletal:        General: No tenderness or deformity. Normal range of motion.     Cervical back: Normal range of motion.  Lymphadenopathy:     Cervical: No cervical adenopathy.  Skin:    General: Skin is warm and dry.     Findings: No erythema or rash.  Neurological:     Mental Status: She is alert and oriented to person, place, and time.  Psychiatric:        Behavior: Behavior normal.        Thought Content: Thought content normal.        Judgment: Judgment normal.     Lab Results  Component Value Date   WBC 3.3 (L) 04/25/2022   HGB 10.9 (L) 04/25/2022   HCT 33.3 (L) 04/25/2022   MCV 92.2 04/25/2022   PLT 245 04/25/2022     Chemistry      Component Value Date/Time   NA 140 04/25/2022 0856   NA 142 02/15/2017 1518   NA 140 04/13/2016 1055   K 4.1 04/25/2022 0856   K 4.1 02/15/2017 1518   K 4.1 04/13/2016 1055   CL 104 04/25/2022 0856   CL 107 02/15/2017 1518   CO2 28 04/25/2022 0856   CO2 29 02/15/2017 1518   CO2 27 04/13/2016 1055   BUN 14 04/25/2022 0856   BUN 17 02/15/2017 1518   BUN 22.1 04/13/2016 1055   CREATININE  0.72 04/25/2022 0856   CREATININE 1.0 02/15/2017 1518   CREATININE 0.8 04/13/2016 1055      Component Value Date/Time   CALCIUM 9.5 04/25/2022 0856   CALCIUM 9.3 02/15/2017 1518   CALCIUM 9.9 04/13/2016 1055   ALKPHOS 56 04/25/2022 0856   ALKPHOS 79 02/15/2017 1518   ALKPHOS 81 04/13/2016 1055  AST 21 04/25/2022 0856   AST 20 04/13/2016 1055   ALT 22 04/25/2022 0856   ALT 24 02/15/2017 1518   ALT 22 04/13/2016 1055   BILITOT 0.5 04/25/2022 0856   BILITOT 0.44 04/13/2016 1055      Impression and Plan: Ms. Fronczak is a 61 year old Caucasian female.  She has had issues with recurrence of thromboembolic events.  She has had these systemically and in the brain.  We then found that she had relapse of the uterine cancer.  She has had systemic chemotherapy for this.  She had gone into remission.  I think that there is clearly evidence of recurrence on the PET scan.  Again, she does not have any parenchymal organ involvement.  I will have to see if she would be a candidate for intraperitoneal chemotherapy.  Again, I am unsure if the HIPEC would be the way to go.  Again I will have to speak with Dr. Crisoforo Oxford at [facility] about this.  I think she is going to want to think about everything.  I know she and her husband are planning on going out to [facility] for a month.  This will be the end of August.  I think she really wants to make this trip.  Again she is very interested in alternative therapies.  Some of these have not heard of.  I know she is done a lot of research.  I know this is a very complicated.  She still has a very good performance status and really can handle incredibly aggressive therapy.  I think we could do a "local" therapy, I think this would probably be a very effective.  We will plan to see her back depending on what happens out at [facility].  I will be very interested to see what the CA-125 level is.   [name], MD 11/14/202310:21 AM

## 2022-04-25 NOTE — Progress Notes (Signed)
Labs drawn from patients port for [employer] per physician order

## 2022-04-26 ENCOUNTER — Other Ambulatory Visit: Payer: Self-pay | Admitting: Hematology & Oncology

## 2022-04-26 DIAGNOSIS — C541 Malignant neoplasm of endometrium: Secondary | ICD-10-CM

## 2022-04-26 LAB — CA 125: Cancer Antigen (CA) 125: 160 U/mL — ABNORMAL HIGH (ref 0.0–38.1)

## 2022-06-01 ENCOUNTER — Encounter: Payer: Self-pay | Admitting: Hematology & Oncology

## 2022-06-06 ENCOUNTER — Encounter: Payer: Self-pay | Admitting: Hematology & Oncology

## 2022-06-23 ENCOUNTER — Encounter: Payer: Self-pay | Admitting: Hematology & Oncology

## 2022-06-23 ENCOUNTER — Inpatient Hospital Stay: Payer: Medicare Other

## 2022-06-23 ENCOUNTER — Inpatient Hospital Stay: Payer: Medicare Other | Attending: Hematology & Oncology

## 2022-06-23 ENCOUNTER — Inpatient Hospital Stay: Payer: Medicare Other | Admitting: Hematology & Oncology

## 2022-06-23 VITALS — BP 115/42 | HR 57 | Temp 97.6°F | Resp 18 | Ht 66.0 in | Wt 126.0 lb

## 2022-06-23 DIAGNOSIS — Z95828 Presence of other vascular implants and grafts: Secondary | ICD-10-CM

## 2022-06-23 DIAGNOSIS — C541 Malignant neoplasm of endometrium: Secondary | ICD-10-CM | POA: Diagnosis not present

## 2022-06-23 DIAGNOSIS — C55 Malignant neoplasm of uterus, part unspecified: Secondary | ICD-10-CM | POA: Insufficient documentation

## 2022-06-23 DIAGNOSIS — Z7901 Long term (current) use of anticoagulants: Secondary | ICD-10-CM | POA: Diagnosis not present

## 2022-06-23 DIAGNOSIS — I669 Occlusion and stenosis of unspecified cerebral artery: Secondary | ICD-10-CM | POA: Diagnosis not present

## 2022-06-23 DIAGNOSIS — Z1509 Genetic susceptibility to other malignant neoplasm: Secondary | ICD-10-CM | POA: Diagnosis not present

## 2022-06-23 DIAGNOSIS — Z1501 Genetic susceptibility to malignant neoplasm of breast: Secondary | ICD-10-CM | POA: Diagnosis not present

## 2022-06-23 DIAGNOSIS — R971 Elevated cancer antigen 125 [CA 125]: Secondary | ICD-10-CM | POA: Insufficient documentation

## 2022-06-23 LAB — CBC WITH DIFFERENTIAL (CANCER CENTER ONLY)
Abs Immature Granulocytes: 0.01 10*3/uL (ref 0.00–0.07)
Basophils Absolute: 0 10*3/uL (ref 0.0–0.1)
Basophils Relative: 1 %
Eosinophils Absolute: 0.3 10*3/uL (ref 0.0–0.5)
Eosinophils Relative: 8 %
HCT: 36.3 % (ref 36.0–46.0)
Hemoglobin: 11.5 g/dL — ABNORMAL LOW (ref 12.0–15.0)
Immature Granulocytes: 0 %
Lymphocytes Relative: 25 %
Lymphs Abs: 0.9 10*3/uL (ref 0.7–4.0)
MCH: 30.9 pg (ref 26.0–34.0)
MCHC: 31.7 g/dL (ref 30.0–36.0)
MCV: 97.6 fL (ref 80.0–100.0)
Monocytes Absolute: 0.4 10*3/uL (ref 0.1–1.0)
Monocytes Relative: 12 %
Neutro Abs: 1.9 10*3/uL (ref 1.7–7.7)
Neutrophils Relative %: 54 %
Platelet Count: 246 10*3/uL (ref 150–400)
RBC: 3.72 MIL/uL — ABNORMAL LOW (ref 3.87–5.11)
RDW: 13.2 % (ref 11.5–15.5)
WBC Count: 3.6 10*3/uL — ABNORMAL LOW (ref 4.0–10.5)
nRBC: 0 % (ref 0.0–0.2)

## 2022-06-23 LAB — LACTATE DEHYDROGENASE: LDH: 160 U/L (ref 98–192)

## 2022-06-23 LAB — CMP (CANCER CENTER ONLY)
ALT: 29 U/L (ref 0–44)
AST: 24 U/L (ref 15–41)
Albumin: 4.8 g/dL (ref 3.5–5.0)
Alkaline Phosphatase: 51 U/L (ref 38–126)
Anion gap: 10 (ref 5–15)
BUN: 9 mg/dL (ref 8–23)
CO2: 27 mmol/L (ref 22–32)
Calcium: 9.7 mg/dL (ref 8.9–10.3)
Chloride: 105 mmol/L (ref 98–111)
Creatinine: 0.8 mg/dL (ref 0.44–1.00)
GFR, Estimated: >60 mL/min (ref >60)
Glucose, Bld: 82 mg/dL (ref 70–99)
Potassium: 4 mmol/L (ref 3.5–5.1)
Sodium: 142 mmol/L (ref 135–145)
Total Bilirubin: 0.5 mg/dL (ref 0.3–1.2)
Total Protein: 7 g/dL (ref 6.5–8.1)

## 2022-06-23 MED ORDER — HEPARIN SOD (PORK) LOCK FLUSH 100 UNIT/ML IV SOLN
500.0000 [IU] | Freq: Once | INTRAVENOUS | Status: AC
  Administered 2022-06-23: 500 [IU] via INTRAVENOUS

## 2022-06-23 MED ORDER — SODIUM CHLORIDE 0.9% FLUSH
10.0000 mL | Freq: Once | INTRAVENOUS | Status: AC
  Administered 2022-06-23: 10 mL via INTRAVENOUS

## 2022-06-23 NOTE — Patient Instructions (Signed)

## 2022-06-23 NOTE — Progress Notes (Signed)
Hematology and Oncology Follow Up Visit  Sherry Williams [id number] Mar 03, 1961 62 y.o. 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  Unfortunately, she has not yet had her PET scan done.  This is to be done on Monday..  She feels great.  She has had no complaints.  There is no abdominal pain.  She has had no change in bowel or bladder habits.  She has had no cough or shortness of breath.  There is been no chest wall pain.  Continues on the low-dose Arixtra for thromboembolic disease.  She has had no issues with bleeding.  She has had no leg swelling or pain.  When we last saw her, her CA-125 was elevated at 160.  She has had a good appetite.  She had no problems over the Holiday season.  She was with her family.  She and her husband are quite active.  They exercise.  Hopefully, they will be able to do travel.  Currently, I would have to say that her performance status is ECOG 0.    Medications:  Current Outpatient Medications:    aspirin 81 MG chewable tablet, Chew 1 tablet by mouth daily., Disp: , Rfl:    atorvastatin (LIPITOR) 40 MG tablet, , Disp: , Rfl:    doxycycline (VIBRAMYCIN) 100 MG capsule, Take 100 mg by mouth daily., Disp: , Rfl:    fondaparinux (ARIXTRA) 2.5 MG/0.5ML SOLN injection, INJECT 2.5 MG INTO THE SKIN DAILY, Disp: 15 mL, Rfl: 6   itraconazole (SPORANOX) 100 MG capsule, Take 100 mg by mouth 2 (two) times daily., Disp: , Rfl:    lidocaine-prilocaine (EMLA) cream, APPLY TOPICALLY TO THE AFFECTED AREA 1 TIME, Disp: 30 g, Rfl: 4   Naltrexone HCl, Pain, (NALTREX) 4.5 MG CAPS, Take by mouth at bedtime., Disp: , Rfl:     Probiotic Product (PROBIOTIC BLEND PO), Take by mouth daily., Disp: , Rfl:    thyroid (ARMOUR) 60 MG tablet, Take 60 mg by mouth daily., Disp: , Rfl:    ARMOUR THYROID 15 MG tablet, Take 15 mg by mouth every morning., Disp: , Rfl:    chlorhexidine (PERIDEX) 0.12 % solution, 15 mLs 2 (two) times daily., Disp: , Rfl:   Allergies:  Allergies  Allergen Reactions   Bee Venom Anaphylaxis, Swelling and Other (See Comments)   Adhesive [Tape] Other (See Comments)    Irritation and red   Aspartame Diarrhea   Aspartame And Phenylalanine Diarrhea    Past Medical History, Surgical history, Social history, and Family History were reviewed and updated.  Review of Systems: Review of Systems  Constitutional: Negative.   HENT: Negative.    Eyes: Negative.   Respiratory: Negative.    Cardiovascular: Negative.   Gastrointestinal:  Positive for abdominal pain.  Genitourinary: Negative.   Musculoskeletal: Negative.   Skin: Negative.   Neurological: Negative.   Endo/Heme/Allergies: Negative.   Psychiatric/Behavioral: Negative.      Physical Exam:  height is '5\' 6"'$  (1.676 m) and weight is 126 lb (57.2 kg). Her oral temperature is 97.6 F (36.4 C). Her blood pressure is 115/42 (abnormal) and her pulse is 57 (abnormal). Her respiration is  18 and oxygen saturation is 100%.   Wt Readings from Last 3 Encounters:  06/23/22 126 lb (57.2 kg)  04/25/22 127 lb (57.6 kg)  04/24/22 125 lb (56.7 kg)    Physical Exam Vitals reviewed.  HENT:     Head: Normocephalic and atraumatic.  Eyes:     Pupils: Pupils are equal, round, and reactive to light.  Cardiovascular:     Rate and Rhythm: Normal rate and regular rhythm.     Heart sounds: Normal heart sounds.  Pulmonary:     Effort: Pulmonary effort is normal.     Breath sounds: Normal breath sounds.  Abdominal:     General: Bowel sounds are normal.     Palpations: Abdomen is soft.     Comments: Abdominal exam shows a soft abdomen.  She has well healing  laparoscopic scars.  I think there are 5 laparoscopic scars.  She has no erythema or exudate or warmth associated with these.  She has no fluid wave in the abdomen.  There is no guarding or rebound tenderness.  She has no palpable liver or spleen tip.  Musculoskeletal:        General: No tenderness or deformity. Normal range of motion.     Cervical back: Normal range of motion.  Lymphadenopathy:     Cervical: No cervical adenopathy.  Skin:    General: Skin is warm and dry.     Findings: No erythema or rash.  Neurological:     Mental Status: She is alert and oriented to person, place, and time.  Psychiatric:        Behavior: Behavior normal.        Thought Content: Thought content normal.        Judgment: Judgment normal.     Lab Results  Component Value Date   WBC 3.6 (L) 06/23/2022   HGB 11.5 (L) 06/23/2022   HCT 36.3 06/23/2022   MCV 97.6 06/23/2022   PLT 246 06/23/2022     Chemistry      Component Value Date/Time   NA 142 06/23/2022 0900   NA 142 02/15/2017 1518   NA 140 04/13/2016 1055   K 4.0 06/23/2022 0900   K 4.1 02/15/2017 1518   K 4.1 04/13/2016 1055   CL 105 06/23/2022 0900   CL 107 02/15/2017 1518   CO2 27 06/23/2022 0900   CO2 29 02/15/2017 1518   CO2 27 04/13/2016 1055   BUN 9 06/23/2022 0900   BUN 17 02/15/2017 1518   BUN 22.1 04/13/2016 1055   CREATININE 0.80 06/23/2022 0900   CREATININE 1.0 02/15/2017 1518   CREATININE 0.8 04/13/2016 1055      Component Value Date/Time   CALCIUM 9.7 06/23/2022 0900   CALCIUM 9.3 02/15/2017 1518   CALCIUM 9.9 04/13/2016 1055   ALKPHOS 51 06/23/2022 0900   ALKPHOS 79 02/15/2017 1518   ALKPHOS 81 04/13/2016 1055   AST 24 06/23/2022 0900   AST 20 04/13/2016 1055   ALT 29 06/23/2022 0900   ALT 24 02/15/2017 1518   ALT 22 04/13/2016 1055   BILITOT 0.5 06/23/2022 0900   BILITOT 0.44 04/13/2016 1055      Impression and Plan: Sherry Williams is a 62 year old Caucasian female.  She has had issues with recurrence of  thromboembolic events.  She has had these systemically and in the brain.  We then found that she had relapse of the uterine cancer.  She has had systemic chemotherapy for this.  She had gone into  remission.  We will see what the CA-125 shows.  I think that the PET scan clearly will be the most important factor for [place].  As far as any type of intervention with therapy, she really would prefer to hold off on treatment as she feels so well and is totally asymptomatic.  I understand this.  I think 1 thing that we can do is that we can see about test her for the folic acid receptor-alpha.  This might be interesting to see and if positive, consider her for one of the new targeted therapies for this receptor.  Immunotherapy also is a consideration for her.  Her cancer is also BRCA2 positive.  As such, we might be able to use a PARP inhibitor.  Again we will have to see what the PET scan shows.  We will see what the CA-125 shows.  I will plan to get her back to see [place] depending on what we find with our PET scan.    [name], MD 1/12/20249:48 AM

## 2022-06-26 ENCOUNTER — Encounter (HOSPITAL_COMMUNITY)
Admission: RE | Admit: 2022-06-26 | Discharge: 2022-06-26 | Disposition: A | Discharge status: Home / Self Care | Payer: Medicare Other | Source: Ambulatory Visit | Attending: Hematology & Oncology | Admitting: Hematology & Oncology

## 2022-06-26 DIAGNOSIS — C541 Malignant neoplasm of endometrium: Secondary | ICD-10-CM | POA: Diagnosis not present

## 2022-06-26 DIAGNOSIS — R599 Enlarged lymph nodes, unspecified: Secondary | ICD-10-CM | POA: Diagnosis not present

## 2022-06-26 DIAGNOSIS — R188 Other ascites: Secondary | ICD-10-CM | POA: Diagnosis not present

## 2022-06-26 LAB — GLUCOSE, CAPILLARY: Glucose-Capillary: 80 mg/dL (ref 70–99)

## 2022-06-26 MED ORDER — FLUDEOXYGLUCOSE F - 18 (FDG) INJECTION
6.3000 | Freq: Once | INTRAVENOUS | Status: AC | PRN
  Administered 2022-06-26: 6.26 via INTRAVENOUS

## 2022-06-27 LAB — CA 125: Cancer Antigen (CA) 125: 205 U/mL — ABNORMAL HIGH (ref 0.0–38.1)

## 2022-07-03 ENCOUNTER — Other Ambulatory Visit: Payer: Self-pay | Admitting: Hematology & Oncology

## 2022-07-03 DIAGNOSIS — C541 Malignant neoplasm of endometrium: Secondary | ICD-10-CM

## 2022-08-09 ENCOUNTER — Telehealth: Payer: Self-pay | Admitting: *Deleted

## 2022-08-09 ENCOUNTER — Encounter: Payer: Self-pay | Admitting: Hematology & Oncology

## 2022-08-09 ENCOUNTER — Other Ambulatory Visit: Payer: Self-pay

## 2022-08-09 DIAGNOSIS — C541 Malignant neoplasm of endometrium: Secondary | ICD-10-CM

## 2022-08-09 NOTE — Telephone Encounter (Signed)
Per staff message Alyson Ingles - Called patient and lvm of upcoming appointment- requested call back to confirm.

## 2022-08-20 ENCOUNTER — Encounter: Payer: Self-pay | Admitting: Hematology & Oncology

## 2022-08-24 ENCOUNTER — Ambulatory Visit (HOSPITAL_COMMUNITY)
Admission: RE | Admit: 2022-08-24 | Discharge: 2022-08-24 | Disposition: A | Discharge status: Home / Self Care | Payer: Medicare Other | Source: Ambulatory Visit | Attending: Hematology & Oncology | Admitting: Hematology & Oncology

## 2022-08-24 DIAGNOSIS — C541 Malignant neoplasm of endometrium: Secondary | ICD-10-CM | POA: Insufficient documentation

## 2022-08-24 LAB — GLUCOSE, CAPILLARY: Glucose-Capillary: 81 mg/dL (ref 70–99)

## 2022-08-24 MED ORDER — FLUDEOXYGLUCOSE F - 18 (FDG) INJECTION
6.3000 | Freq: Once | INTRAVENOUS | Status: AC
  Administered 2022-08-24: 6.3 via INTRAVENOUS

## 2022-08-29 ENCOUNTER — Encounter: Payer: Self-pay | Admitting: *Deleted

## 2022-08-29 NOTE — Progress Notes (Addendum)
Dr Marin Olp would like to provide patient with information on how to enroll in Dr Ranae Pila Phase IIa Basket Trial.   I was able to find the information on the study, including overview and participation criteria.   [email or web address]  Also found the enrollment form that the patient fills out and email.  [email or web address]  Both of these sent to patient via [facility].   Dr Marin Olp would also like to know all the methods that can be used to test patient for Folic Acid Receptor Alpha. He is particularly interested in liquid biopsy. Reached out to the [facility] at [facility].   Spoke to Medical team at [employer]. At this time this variant can only be tested with tissue. There is no liquid biopsy.  Oncology Nurse Navigator Documentation     08/29/2022   11:00 AM  Oncology Nurse Navigator Flowsheets  Navigator Location [facility]  Navigator Encounter Type Other:;MyChart  Patient Visit Type MedOnc  Treatment Phase Active Tx  Barriers/Navigation Needs Coordination of Care  Interventions Coordination of Care;Education  Acuity Level 2-Minimal Needs (1-2 Barriers Identified)  Coordination of Care Other  Education Method Written  Time Spent with Patient 30

## 2022-08-31 ENCOUNTER — Encounter: Payer: Self-pay | Admitting: Hematology & Oncology

## 2022-08-31 ENCOUNTER — Inpatient Hospital Stay: Payer: Medicare Other | Attending: Hematology & Oncology

## 2022-08-31 ENCOUNTER — Inpatient Hospital Stay: Payer: Medicare Other | Admitting: Hematology & Oncology

## 2022-08-31 ENCOUNTER — Inpatient Hospital Stay: Payer: Medicare Other

## 2022-08-31 ENCOUNTER — Other Ambulatory Visit: Payer: Self-pay

## 2022-08-31 VITALS — BP 107/60 | HR 65 | Temp 98.7°F | Resp 20 | Ht 66.0 in | Wt 131.4 lb

## 2022-08-31 DIAGNOSIS — R188 Other ascites: Secondary | ICD-10-CM | POA: Diagnosis not present

## 2022-08-31 DIAGNOSIS — Z86718 Personal history of other venous thrombosis and embolism: Secondary | ICD-10-CM | POA: Insufficient documentation

## 2022-08-31 DIAGNOSIS — R599 Enlarged lymph nodes, unspecified: Secondary | ICD-10-CM | POA: Insufficient documentation

## 2022-08-31 DIAGNOSIS — I82409 Acute embolism and thrombosis of unspecified deep veins of unspecified lower extremity: Secondary | ICD-10-CM | POA: Diagnosis not present

## 2022-08-31 DIAGNOSIS — Z7901 Long term (current) use of anticoagulants: Secondary | ICD-10-CM | POA: Insufficient documentation

## 2022-08-31 DIAGNOSIS — C541 Malignant neoplasm of endometrium: Secondary | ICD-10-CM | POA: Diagnosis not present

## 2022-08-31 DIAGNOSIS — C801 Malignant (primary) neoplasm, unspecified: Secondary | ICD-10-CM | POA: Insufficient documentation

## 2022-08-31 DIAGNOSIS — Z1501 Genetic susceptibility to malignant neoplasm of breast: Secondary | ICD-10-CM | POA: Insufficient documentation

## 2022-08-31 DIAGNOSIS — R971 Elevated cancer antigen 125 [CA 125]: Secondary | ICD-10-CM | POA: Insufficient documentation

## 2022-08-31 DIAGNOSIS — R7989 Other specified abnormal findings of blood chemistry: Secondary | ICD-10-CM | POA: Diagnosis not present

## 2022-08-31 DIAGNOSIS — I669 Occlusion and stenosis of unspecified cerebral artery: Secondary | ICD-10-CM | POA: Diagnosis present

## 2022-08-31 DIAGNOSIS — I82401 Acute embolism and thrombosis of unspecified deep veins of right lower extremity: Secondary | ICD-10-CM

## 2022-08-31 LAB — CMP (CANCER CENTER ONLY)
ALT: 21 U/L (ref 0–44)
AST: 24 U/L (ref 15–41)
Albumin: 4.5 g/dL (ref 3.5–5.0)
Alkaline Phosphatase: 50 U/L (ref 38–126)
Anion gap: 9 (ref 5–15)
BUN: 10 mg/dL (ref 8–23)
CO2: 29 mmol/L (ref 22–32)
Calcium: 9.5 mg/dL (ref 8.9–10.3)
Chloride: 103 mmol/L (ref 98–111)
Creatinine: 0.71 mg/dL (ref 0.44–1.00)
GFR, Estimated: >60 mL/min (ref >60)
Glucose, Bld: 90 mg/dL (ref 70–99)
Potassium: 4.1 mmol/L (ref 3.5–5.1)
Sodium: 141 mmol/L (ref 135–145)
Total Bilirubin: 0.4 mg/dL (ref 0.3–1.2)
Total Protein: 6.6 g/dL (ref 6.5–8.1)

## 2022-08-31 LAB — CBC WITH DIFFERENTIAL (CANCER CENTER ONLY)
Abs Immature Granulocytes: 0.02 10*3/uL (ref 0.00–0.07)
Basophils Absolute: 0 10*3/uL (ref 0.0–0.1)
Basophils Relative: 1 %
Eosinophils Absolute: 0.3 10*3/uL (ref 0.0–0.5)
Eosinophils Relative: 5 %
HCT: 35.6 % — ABNORMAL LOW (ref 36.0–46.0)
Hemoglobin: 11.6 g/dL — ABNORMAL LOW (ref 12.0–15.0)
Immature Granulocytes: 0 %
Lymphocytes Relative: 17 %
Lymphs Abs: 0.9 10*3/uL (ref 0.7–4.0)
MCH: 30.4 pg (ref 26.0–34.0)
MCHC: 32.6 g/dL (ref 30.0–36.0)
MCV: 93.2 fL (ref 80.0–100.0)
Monocytes Absolute: 0.5 10*3/uL (ref 0.1–1.0)
Monocytes Relative: 10 %
Neutro Abs: 3.4 10*3/uL (ref 1.7–7.7)
Neutrophils Relative %: 67 %
Platelet Count: 232 10*3/uL (ref 150–400)
RBC: 3.82 MIL/uL — ABNORMAL LOW (ref 3.87–5.11)
RDW: 13.1 % (ref 11.5–15.5)
WBC Count: 5 10*3/uL (ref 4.0–10.5)
nRBC: 0 % (ref 0.0–0.2)

## 2022-08-31 LAB — LACTATE DEHYDROGENASE: LDH: 177 U/L (ref 98–192)

## 2022-08-31 LAB — D-DIMER, QUANTITATIVE: D-Dimer, Quant: 7.3 ug/mL-FEU — ABNORMAL HIGH (ref 0.00–0.50)

## 2022-08-31 MED ORDER — HEPARIN SOD (PORK) LOCK FLUSH 100 UNIT/ML IV SOLN
500.0000 [IU] | Freq: Once | INTRAVENOUS | Status: AC
  Administered 2022-08-31: 500 [IU] via INTRAVENOUS

## 2022-08-31 MED ORDER — SODIUM CHLORIDE 0.9% FLUSH
10.0000 mL | Freq: Once | INTRAVENOUS | Status: AC
  Administered 2022-08-31: 10 mL

## 2022-08-31 NOTE — Progress Notes (Signed)
Hematology and Oncology Follow Up Visit  Sherry Williams [id number] [birth year] 62 y.o. 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  We did go ahead and get a PET scan on her.  This was done on 08/24/2022.  This did show progressive disease.  She had new adenopathy up in the chest.  She had increase in her peritoneal implants.  There is some pelvic ascites.  I think what is more troublesome is the fact that her CA-125 is now over 1500.  This went up from 200 which was done back in January.  Clearly, her disease is progressing.  She is interested in doing a vaccine trial.  This is being done at a research center.  We gave her information about this before she had come in to see [place].  Hopefully, she will be able to qualify with this.  I will little bit worried about the rise in the CA-125.  I would think that if the vaccine is going be made, she will need to have some fresh tissue.  She does have tissue from her surgery that was back in 2021.  I am not sure this would be adequate for any kind of vaccine preparation.  Of note, tumor is BRCA 2 positive.  As such, there is always a opportunity to use a PARP inhibitor.  She still has a great performance status.  She is still very active.  She is still walking.  She is working.  She did not have any problems with pain.  Her appetite is good.  I do not think she is lost any weight.  She has had no obvious change in bowel or bladder habits.  She has had no swollen lymph nodes.  She has had no cough or shortness of breath.  She has had no fever.  I would have said that her performance status is probably  ECOG 0.    Medications:  Current Outpatient Medications:    aspirin 81 MG chewable tablet, Chew 1 tablet by mouth daily., Disp: , Rfl:    atorvastatin (LIPITOR) 40 MG tablet, 40 mg daily., Disp: , Rfl:    doxycycline (VIBRAMYCIN) 100 MG capsule, Take 100 mg by mouth daily. 08/31/2022 Takes for 30 days/off 30 days., Disp: , Rfl:    fondaparinux (ARIXTRA) 2.5 MG/0.5ML SOLN injection, INJECT 2.5 MG INTO THE SKIN DAILY, Disp: 15 mL, Rfl: 6   itraconazole (SPORANOX) 100 MG capsule, Take 100 mg by mouth 2 (two) times daily., Disp: , Rfl:    lidocaine-prilocaine (EMLA) cream, APPLY TOPICALLY TO THE AFFECTED AREA 1 TIME, Disp: 30 g, Rfl: 4   Naltrexone HCl, Pain, (NALTREX) 4.5 MG CAPS, Take by mouth at bedtime., Disp: , Rfl:    Probiotic Product (PROBIOTIC BLEND PO), Take by mouth daily., Disp: , Rfl:    thyroid (ARMOUR) 60 MG tablet, Take 60 mg by mouth daily., Disp: , Rfl:    ARMOUR THYROID 15 MG tablet, Take 15 mg by mouth every morning., Disp: , Rfl:    chlorhexidine (PERIDEX) 0.12 % solution, 15 mLs 2 (two) times daily., Disp: , Rfl:   Allergies:  Allergies  Allergen Reactions  Bee Venom Anaphylaxis, Swelling and Other (See Comments)   Adhesive [Tape] Other (See Comments)    Irritation and red   Aspartame Diarrhea   Aspartame And Phenylalanine Diarrhea    Past Medical History, Surgical history, Social history, and Family History were reviewed and updated.  Review of Systems: Review of Systems  Constitutional: Negative.   HENT: Negative.    Eyes: Negative.   Respiratory: Negative.    Cardiovascular: Negative.   Gastrointestinal:  Positive for abdominal pain.  Genitourinary: Negative.   Musculoskeletal: Negative.   Skin: Negative.   Neurological: Negative.   Endo/Heme/Allergies: Negative.   Psychiatric/Behavioral: Negative.      Physical Exam:  height is 5\' 6"  (1.676 m) and weight is 131 lb 6.4 oz (59.6 kg). Her oral temperature is 98.7 F (37.1 C). Her blood pressure is  107/60 and her pulse is 65. Her respiration is 20 and oxygen saturation is 100%.   Wt Readings from Last 3 Encounters:  08/31/22 131 lb 6.4 oz (59.6 kg)  06/23/22 126 lb (57.2 kg)  04/25/22 127 lb (57.6 kg)    Physical Exam Vitals reviewed.  HENT:     Head: Normocephalic and atraumatic.  Eyes:     Pupils: Pupils are equal, round, and reactive to light.  Cardiovascular:     Rate and Rhythm: Normal rate and regular rhythm.     Heart sounds: Normal heart sounds.  Pulmonary:     Effort: Pulmonary effort is normal.     Breath sounds: Normal breath sounds.  Abdominal:     General: Bowel sounds are normal.     Palpations: Abdomen is soft.     Comments: Abdominal exam shows a soft abdomen.  She has well healing laparoscopic scars.  I think there are 5 laparoscopic scars.  She has no erythema or exudate or warmth associated with these.  She has no fluid wave in the abdomen.  There is no guarding or rebound tenderness.  She has no palpable liver or spleen tip.  Musculoskeletal:        General: No tenderness or deformity. Normal range of motion.     Cervical back: Normal range of motion.  Lymphadenopathy:     Cervical: No cervical adenopathy.  Skin:    General: Skin is warm and dry.     Findings: No erythema or rash.  Neurological:     Mental Status: She is alert and oriented to person, place, and time.  Psychiatric:        Behavior: Behavior normal.        Thought Content: Thought content normal.        Judgment: Judgment normal.     Lab Results  Component Value Date   WBC 5.0 08/31/2022   HGB 11.6 (L) 08/31/2022   HCT 35.6 (L) 08/31/2022   MCV 93.2 08/31/2022   PLT 232 08/31/2022     Chemistry      Component Value Date/Time   NA 142 06/23/2022 0900   NA 142 02/15/2017 1518   NA 140 04/13/2016 1055   K 4.0 06/23/2022 0900   K 4.1 02/15/2017 1518   K 4.1 04/13/2016 1055   CL 105 06/23/2022 0900   CL 107 02/15/2017 1518   CO2 27 06/23/2022 0900   CO2 29 02/15/2017  1518   CO2 27 04/13/2016 1055   BUN 9 06/23/2022 0900   BUN 17 02/15/2017 1518   BUN 22.1 04/13/2016 1055   CREATININE 0.80 06/23/2022 0900   CREATININE 1.0 02/15/2017  1518   CREATININE 0.8 04/13/2016 1055      Component Value Date/Time   CALCIUM 9.7 06/23/2022 0900   CALCIUM 9.3 02/15/2017 1518   CALCIUM 9.9 04/13/2016 1055   ALKPHOS 51 06/23/2022 0900   ALKPHOS 79 02/15/2017 1518   ALKPHOS 81 04/13/2016 1055   AST 24 06/23/2022 0900   AST 20 04/13/2016 1055   ALT 29 06/23/2022 0900   ALT 24 02/15/2017 1518   ALT 22 04/13/2016 1055   BILITOT 0.5 06/23/2022 0900   BILITOT 0.44 04/13/2016 1055      Impression and Plan: Sherry Williams is a 62 year old Caucasian female.  She has had issues with recurrence of thromboembolic events.  She has had these systemically and in the brain.  Again, I think that right have to do some current therapy with her.  I do think that the rise in the CA-125 is just so significant.  I really think that we probably need to consider systemic chemotherapy.  I think this way, we can probably get a more quick response.  I really think that carboplatinum/Gemzar would not be a bad idea for her.  We also could consider immunotherapy along with this.  I know that she has been somewhat adverse to using chemotherapy.  She is was tried to use a more holistic approach.  I definitely applaud her for her approach.  Again she wants to maintain a good quality of life.  1 option also would be to just use a PARP inhibitor.  I think with a PARP inhibitor, it may take a while before we see any response.  I still think she might be interested in the vaccine trial.  Again, the trial might require some fresh specimen.  If that is the case, I am sure we can probably get some fresh tissue.  With the elevated D-dimer that she has, we probably need to get a Doppler of her legs just to make sure that there is nothing going on that we are missing on her exam.  She will do a lot of  research.  I told her that she can certainly take her time and decide what might be best for her.  Again, we will certainly accommodate this.  Again, I think that would be reasonable to use carboplatin/Gemzar along with immunotherapy.  We will plan to get her back when she makes a decision as to what she would like to do.    [name], MD 3/21/20249:06 AM

## 2022-08-31 NOTE — Patient Instructions (Signed)

## 2022-09-02 LAB — CA 125: Cancer Antigen (CA) 125: 1566 U/mL — ABNORMAL HIGH (ref 0.0–38.1)

## 2022-09-03 ENCOUNTER — Encounter: Payer: Self-pay | Admitting: Hematology & Oncology

## 2022-09-05 ENCOUNTER — Encounter: Payer: Self-pay | Admitting: Hematology & Oncology

## 2022-09-08 ENCOUNTER — Ambulatory Visit (HOSPITAL_BASED_OUTPATIENT_CLINIC_OR_DEPARTMENT_OTHER)
Admission: RE | Admit: 2022-09-08 | Discharge: 2022-09-08 | Disposition: A | Discharge status: Home / Self Care | Payer: Medicare Other | Source: Ambulatory Visit | Attending: Hematology & Oncology | Admitting: Hematology & Oncology

## 2022-09-08 ENCOUNTER — Encounter: Payer: Self-pay | Admitting: *Deleted

## 2022-09-08 DIAGNOSIS — I82409 Acute embolism and thrombosis of unspecified deep veins of unspecified lower extremity: Secondary | ICD-10-CM | POA: Insufficient documentation

## 2022-09-08 DIAGNOSIS — Z8639 Personal history of other endocrine, nutritional and metabolic disease: Secondary | ICD-10-CM | POA: Diagnosis not present

## 2022-09-08 DIAGNOSIS — R7989 Other specified abnormal findings of blood chemistry: Secondary | ICD-10-CM | POA: Diagnosis not present

## 2022-09-08 DIAGNOSIS — E559 Vitamin D deficiency, unspecified: Secondary | ICD-10-CM | POA: Diagnosis not present

## 2022-09-11 ENCOUNTER — Encounter: Payer: Self-pay | Admitting: Hematology & Oncology

## 2022-09-12 ENCOUNTER — Encounter: Payer: Self-pay | Admitting: Hematology & Oncology

## 2022-09-13 ENCOUNTER — Other Ambulatory Visit: Payer: Self-pay | Admitting: Hematology & Oncology

## 2022-09-13 DIAGNOSIS — C541 Malignant neoplasm of endometrium: Secondary | ICD-10-CM

## 2022-09-15 ENCOUNTER — Encounter: Payer: Self-pay | Admitting: Hematology & Oncology

## 2022-09-15 ENCOUNTER — Ambulatory Visit (HOSPITAL_COMMUNITY)
Admission: RE | Admit: 2022-09-15 | Discharge: 2022-09-15 | Disposition: A | Discharge status: Home / Self Care | Payer: Medicare Other | Source: Ambulatory Visit | Attending: Hematology & Oncology | Admitting: Hematology & Oncology

## 2022-09-15 DIAGNOSIS — R188 Other ascites: Secondary | ICD-10-CM | POA: Diagnosis not present

## 2022-09-15 DIAGNOSIS — C541 Malignant neoplasm of endometrium: Secondary | ICD-10-CM | POA: Insufficient documentation

## 2022-09-15 MED ORDER — LIDOCAINE HCL 1 % IJ SOLN
INTRAMUSCULAR | Status: AC
  Filled 2022-09-15: qty 20

## 2022-09-15 NOTE — Procedures (Signed)
Ultrasound-guided diagnostic and therapeutic paracentesis performed yielding 525 milliliters of straw colored fluid.  Fluid was sent to lab for analysis. No immediate complications. EBL is now.

## 2022-09-18 ENCOUNTER — Encounter: Payer: Self-pay | Admitting: Hematology & Oncology

## 2022-09-19 ENCOUNTER — Encounter: Payer: Self-pay | Admitting: Hematology & Oncology

## 2022-09-19 ENCOUNTER — Ambulatory Visit (HOSPITAL_BASED_OUTPATIENT_CLINIC_OR_DEPARTMENT_OTHER)
Admission: RE | Admit: 2022-09-19 | Discharge: 2022-09-19 | Disposition: A | Discharge status: Home / Self Care | Payer: Medicare Other | Source: Ambulatory Visit | Attending: Hematology & Oncology | Admitting: Hematology & Oncology

## 2022-09-19 ENCOUNTER — Inpatient Hospital Stay: Payer: Medicare Other

## 2022-09-19 ENCOUNTER — Other Ambulatory Visit: Payer: Self-pay

## 2022-09-19 ENCOUNTER — Other Ambulatory Visit: Payer: Self-pay | Admitting: Hematology & Oncology

## 2022-09-19 ENCOUNTER — Inpatient Hospital Stay: Payer: Medicare Other | Attending: Hematology & Oncology | Admitting: Hematology & Oncology

## 2022-09-19 VITALS — BP 109/74 | HR 69 | Temp 97.6°F | Resp 20 | Ht 66.0 in | Wt 128.0 lb

## 2022-09-19 DIAGNOSIS — I82401 Acute embolism and thrombosis of unspecified deep veins of right lower extremity: Secondary | ICD-10-CM | POA: Diagnosis not present

## 2022-09-19 DIAGNOSIS — Z7901 Long term (current) use of anticoagulants: Secondary | ICD-10-CM

## 2022-09-19 DIAGNOSIS — I82452 Acute embolism and thrombosis of left peroneal vein: Secondary | ICD-10-CM | POA: Insufficient documentation

## 2022-09-19 DIAGNOSIS — R188 Other ascites: Secondary | ICD-10-CM | POA: Diagnosis not present

## 2022-09-19 DIAGNOSIS — I82402 Acute embolism and thrombosis of unspecified deep veins of left lower extremity: Secondary | ICD-10-CM | POA: Diagnosis not present

## 2022-09-19 DIAGNOSIS — C541 Malignant neoplasm of endometrium: Secondary | ICD-10-CM

## 2022-09-19 DIAGNOSIS — I63019 Cerebral infarction due to thrombosis of unspecified vertebral artery: Secondary | ICD-10-CM | POA: Diagnosis not present

## 2022-09-19 DIAGNOSIS — Z5111 Encounter for antineoplastic chemotherapy: Secondary | ICD-10-CM | POA: Insufficient documentation

## 2022-09-19 DIAGNOSIS — I82442 Acute embolism and thrombosis of left tibial vein: Secondary | ICD-10-CM | POA: Diagnosis not present

## 2022-09-19 DIAGNOSIS — Z5112 Encounter for antineoplastic immunotherapy: Secondary | ICD-10-CM | POA: Insufficient documentation

## 2022-09-19 DIAGNOSIS — Z79899 Other long term (current) drug therapy: Secondary | ICD-10-CM | POA: Diagnosis not present

## 2022-09-19 DIAGNOSIS — Z8673 Personal history of transient ischemic attack (TIA), and cerebral infarction without residual deficits: Secondary | ICD-10-CM | POA: Insufficient documentation

## 2022-09-19 DIAGNOSIS — I82432 Acute embolism and thrombosis of left popliteal vein: Secondary | ICD-10-CM | POA: Diagnosis not present

## 2022-09-19 LAB — CMP (CANCER CENTER ONLY)
ALT: 14 U/L (ref 0–44)
AST: 20 U/L (ref 15–41)
Albumin: 4 g/dL (ref 3.5–5.0)
Alkaline Phosphatase: 49 U/L (ref 38–126)
Anion gap: 10 (ref 5–15)
BUN: 13 mg/dL (ref 8–23)
CO2: 28 mmol/L (ref 22–32)
Calcium: 8.9 mg/dL (ref 8.9–10.3)
Chloride: 103 mmol/L (ref 98–111)
Creatinine: 0.64 mg/dL (ref 0.44–1.00)
GFR, Estimated: >60 mL/min (ref >60)
Glucose, Bld: 91 mg/dL (ref 70–99)
Potassium: 3.8 mmol/L (ref 3.5–5.1)
Sodium: 141 mmol/L (ref 135–145)
Total Bilirubin: 0.3 mg/dL (ref 0.3–1.2)
Total Protein: 6.4 g/dL — ABNORMAL LOW (ref 6.5–8.1)

## 2022-09-19 LAB — CBC WITH DIFFERENTIAL (CANCER CENTER ONLY)
Abs Immature Granulocytes: 0.02 10*3/uL (ref 0.00–0.07)
Basophils Absolute: 0 10*3/uL (ref 0.0–0.1)
Basophils Relative: 0 %
Eosinophils Absolute: 0.1 10*3/uL (ref 0.0–0.5)
Eosinophils Relative: 3 %
HCT: 33.5 % — ABNORMAL LOW (ref 36.0–46.0)
Hemoglobin: 11 g/dL — ABNORMAL LOW (ref 12.0–15.0)
Immature Granulocytes: 0 %
Lymphocytes Relative: 18 %
Lymphs Abs: 1 10*3/uL (ref 0.7–4.0)
MCH: 30.2 pg (ref 26.0–34.0)
MCHC: 32.8 g/dL (ref 30.0–36.0)
MCV: 92 fL (ref 80.0–100.0)
Monocytes Absolute: 0.5 10*3/uL (ref 0.1–1.0)
Monocytes Relative: 10 %
Neutro Abs: 3.8 10*3/uL (ref 1.7–7.7)
Neutrophils Relative %: 69 %
Platelet Count: 248 10*3/uL (ref 150–400)
RBC: 3.64 MIL/uL — ABNORMAL LOW (ref 3.87–5.11)
RDW: 12.6 % (ref 11.5–15.5)
WBC Count: 5.5 10*3/uL (ref 4.0–10.5)
nRBC: 0 % (ref 0.0–0.2)

## 2022-09-19 LAB — MAGNESIUM: Magnesium: 2.2 mg/dL (ref 1.7–2.4)

## 2022-09-19 LAB — LACTATE DEHYDROGENASE: LDH: 197 U/L — ABNORMAL HIGH (ref 98–192)

## 2022-09-19 MED ORDER — SODIUM CHLORIDE 0.9% FLUSH
10.0000 mL | Freq: Once | INTRAVENOUS | Status: AC
  Administered 2022-09-19: 10 mL via INTRAVENOUS

## 2022-09-19 MED ORDER — FONDAPARINUX SODIUM 7.5 MG/0.6ML ~~LOC~~ SOLN: 7.5000 mg | SUBCUTANEOUS | 4 refills | Status: DC

## 2022-09-19 MED ORDER — HEPARIN SOD (PORK) LOCK FLUSH 100 UNIT/ML IV SOLN
500.0000 [IU] | Freq: Once | INTRAVENOUS | Status: AC
  Administered 2022-09-19: 500 [IU] via INTRAVENOUS

## 2022-09-19 NOTE — Progress Notes (Signed)
DISCONTINUE OFF PATHWAY REGIMEN - Ovarian   OFF02304:Carboplatin + Paclitaxel (5/175) q21 Days:   A cycle is every 21 days:     Paclitaxel      Carboplatin   **Always confirm dose/schedule in your pharmacy ordering system**  REASON: Disease Progression PRIOR TREATMENT: Off Pathway: Carboplatin + Paclitaxel (5/175) q21 Days TREATMENT RESPONSE: Partial Response (PR)  START OFF PATHWAY REGIMEN - Ovarian   OFF01005:Cisplatin 70 mg/m2 IV D1 + Gemcitabine 1,000 mg/m2 IV D1,8 q21 Days:   A cycle is every 21 days:     Gemcitabine      Cisplatin   **Always confirm dose/schedule in your pharmacy ordering system**  Patient Characteristics: Recurrent or Progressive Disease, Second Line, Platinum Sensitive and ? 6 Months Since Last Therapy, Not a Candidate for Secondary Debulking Surgery BRCA Mutation Status: Present (Somatic) Therapeutic Status: Recurrent or Progressive Disease Line of Therapy: Second Line  Intent of Therapy: Non-Curative / Palliative Intent, Discussed with Patient

## 2022-09-19 NOTE — Patient Instructions (Signed)

## 2022-09-20 ENCOUNTER — Other Ambulatory Visit: Payer: Self-pay

## 2022-09-20 ENCOUNTER — Encounter: Payer: Self-pay | Admitting: Hematology & Oncology

## 2022-09-20 LAB — CYTOLOGY - NON PAP

## 2022-09-20 NOTE — Progress Notes (Signed)
Hematology and Oncology Follow Up Visit  Sherry Williams [id number] 1961/03/22 62 y.o. 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  Unfortunately, she is not can be a candidate for the vaccine trial.  I had emailed the trial coordinator.  It seems as if we do not have fresh tissue.  In addition, it seems as if her disease is progressing relatively quickly.  Her last CA-125 was 1566.  She has some pelvic ascites.  We had a paracentesis done on her.  This was done on 09/15/2022.  I think about 520 cc of fluid was removed.  Not surprising, the cytology ([id number]) does show some malignant cells.  She is still in great shape.  The next unfortunate news was that she now has a new blood clot in the left leg.  She woke up this morning with some pain and swelling in the left lower leg.  We did do a Doppler.  This did show a thrombus that was occlusive in the popliteal vein and the peroneal vein.  She had been on Arixtra 2.5 mg subcu daily.  We will have to increase her back up to full dose at 7.5 mg p.o. daily.  I find it no coincidence that she now developed a new thromboembolic event as this is how her disease recurred several years ago.  We we will get started with systemic chemotherapy.  I do believe that we will have a good chance of getting a good response.  I think that utilizing cisplatin/gemcitabine/pembrolizumab would not be a bad idea.  I think the chance of getting a response that is significant should be over 60-70%.  She has not had chemotherapy for several years.  As such, I think that she would definitely respond to treatment.  She  has had no cough or shortness of breath.  She has had no headache.  There has been no change in bowel or bladder habits.  Overall, I would have said that her performance status is probably ECOG 1.     Medications:  Current Outpatient Medications:    ARMOUR THYROID 90 MG tablet, Take 90 mg by mouth every morning., Disp: , Rfl:    aspirin 81 MG chewable tablet, Chew 1 tablet by mouth daily., Disp: , Rfl:    atorvastatin (LIPITOR) 40 MG tablet, 20 mg daily., Disp: , Rfl:    doxycycline (VIBRAMYCIN) 100 MG capsule, Take 100 mg by mouth daily. 08/31/2022 Takes for 30 days/off 30 days., Disp: , Rfl:    fondaparinux (ARIXTRA) 7.5 MG/0.6ML SOLN injection, Inject 0.6 mLs (7.5 mg total) into the skin daily., Disp: 3 mL, Rfl: 4   lidocaine-prilocaine (EMLA) cream, APPLY TOPICALLY TO THE AFFECTED AREA 1 TIME, Disp: 30 g, Rfl: 4   Naltrexone HCl, Pain, (NALTREX) 4.5 MG CAPS, Take by mouth at bedtime., Disp: , Rfl:    Probiotic Product (PROBIOTIC BLEND PO), Take by mouth daily., Disp: , Rfl:    thyroid (ARMOUR) 60 MG tablet, Take 60 mg by mouth daily., Disp: , Rfl:    ARMOUR THYROID 15 MG tablet, Take 15 mg by mouth every morning., Disp: , Rfl:  chlorhexidine (PERIDEX) 0.12 % solution, 15 mLs 2 (two) times daily., Disp: , Rfl:    itraconazole (SPORANOX) 100 MG capsule, Take 100 mg by mouth 2 (two) times daily. (Patient not taking: Reported on 09/19/2022), Disp: , Rfl:   Allergies:  Allergies  Allergen Reactions   Bee Venom Anaphylaxis, Swelling and Other (See Comments)   Adhesive [Tape] Other (See Comments)    Irritation and red   Aspartame Diarrhea   Aspartame And Phenylalanine Diarrhea    Past Medical History, Surgical history, Social history, and Family History were reviewed and updated.  Review of Systems: Review of Systems  Constitutional: Negative.   HENT: Negative.    Eyes: Negative.   Respiratory: Negative.    Cardiovascular: Negative.   Gastrointestinal:  Positive for abdominal pain.   Genitourinary: Negative.   Musculoskeletal: Negative.   Skin: Negative.   Neurological: Negative.   Endo/Heme/Allergies: Negative.   Psychiatric/Behavioral: Negative.      Physical Exam:  height is 5\' 6"  (1.676 m) and weight is 128 lb 0.6 oz (58.1 kg). Her oral temperature is 97.6 F (36.4 C). Her blood pressure is 109/74 and her pulse is 69. Her respiration is 20 and oxygen saturation is 99%.   Wt Readings from Last 3 Encounters:  09/19/22 128 lb 0.6 oz (58.1 kg)  08/31/22 131 lb 6.4 oz (59.6 kg)  06/23/22 126 lb (57.2 kg)    Physical Exam Vitals reviewed.  HENT:     Head: Normocephalic and atraumatic.  Eyes:     Pupils: Pupils are equal, round, and reactive to light.  Cardiovascular:     Rate and Rhythm: Normal rate and regular rhythm.     Heart sounds: Normal heart sounds.  Pulmonary:     Effort: Pulmonary effort is normal.     Breath sounds: Normal breath sounds.  Abdominal:     General: Bowel sounds are normal.     Palpations: Abdomen is soft.     Comments: Abdominal exam shows a soft abdomen.  She has well healing laparoscopic scars.  I think there are 5 laparoscopic scars.  She has no erythema or exudate or warmth associated with these.  She has no fluid wave in the abdomen.  There is no guarding or rebound tenderness.  She has no palpable liver or spleen tip.  Musculoskeletal:        General: No tenderness or deformity. Normal range of motion.     Cervical back: Normal range of motion.  Lymphadenopathy:     Cervical: No cervical adenopathy.  Skin:    General: Skin is warm and dry.     Findings: No erythema or rash.  Neurological:     Mental Status: She is alert and oriented to person, place, and time.  Psychiatric:        Behavior: Behavior normal.        Thought Content: Thought content normal.        Judgment: Judgment normal.      Lab Results  Component Value Date   WBC 5.5 09/19/2022   HGB 11.0 (L) 09/19/2022   HCT 33.5 (L) 09/19/2022   MCV 92.0  09/19/2022   PLT 248 09/19/2022     Chemistry      Component Value Date/Time   NA 141 09/19/2022 1220   NA 142 02/15/2017 1518   NA 140 04/13/2016 1055   K 3.8 09/19/2022 1220   K 4.1 02/15/2017 1518   K 4.1 04/13/2016 1055   CL 103 09/19/2022 1220  CL 107 02/15/2017 1518   CO2 28 09/19/2022 1220   CO2 29 02/15/2017 1518   CO2 27 04/13/2016 1055   BUN 13 09/19/2022 1220   BUN 17 02/15/2017 1518   BUN 22.1 04/13/2016 1055   CREATININE 0.64 09/19/2022 1220   CREATININE 1.0 02/15/2017 1518   CREATININE 0.8 04/13/2016 1055      Component Value Date/Time   CALCIUM 8.9 09/19/2022 1220   CALCIUM 9.3 02/15/2017 1518   CALCIUM 9.9 04/13/2016 1055   ALKPHOS 49 09/19/2022 1220   ALKPHOS 79 02/15/2017 1518   ALKPHOS 81 04/13/2016 1055   AST 20 09/19/2022 1220   AST 20 04/13/2016 1055   ALT 14 09/19/2022 1220   ALT 24 02/15/2017 1518   ALT 22 04/13/2016 1055   BILITOT 0.3 09/19/2022 1220   BILITOT 0.44 04/13/2016 1055      Impression and Plan: Sherry Williams is a 62 year old Caucasian female.  She has another recurrence of her gynecologic malignancy.  Again, we will get her started on treatment.  I think going to have to use chemotherapy.  I think utilizing immunotherapy along with chemotherapy would certainly help.  She is in good shape.  I think she could tolerate the combination of  CDDP/Gemzar/Keytruda.    We will try to get started this week.  I am sure that the CA-125 will give us an idea as to whether or not she is responding.  Again we will get increase the Arixtra up to 7.5 mg subcu daily.  She does this herself.  She is very good about doing this.  I would like to see her back we start her second cycle of treatment.  We will do 2 cycles and then repeat her PET scan.  Again, the CA-125 should give us a good idea as to whether or not she is responding.  I just hate that we have to use chemotherapy on her.  I know that she is really tried to utilize holistic therapy.  I  certainly applaud her for doing it this way.   Josph MachoPeter R Caytlin Better, MD 4/10/20244:44 PM

## 2022-09-21 ENCOUNTER — Other Ambulatory Visit: Payer: Self-pay | Admitting: *Deleted

## 2022-09-21 ENCOUNTER — Encounter: Payer: Self-pay | Admitting: *Deleted

## 2022-09-21 LAB — CA 125: Cancer Antigen (CA) 125: 2524 U/mL — ABNORMAL HIGH (ref 0.0–38.1)

## 2022-09-21 MED ORDER — FONDAPARINUX SODIUM 7.5 MG/0.6ML ~~LOC~~ SOLN: 7.5000 mg | SUBCUTANEOUS | 4 refills | Status: DC

## 2022-09-21 NOTE — Progress Notes (Signed)
Per Dr Myna Hidalgo, request for [facility] testing sent on specimen [id number] DOS 09/15/2022.  Oncology Nurse Navigator Documentation     09/21/2022    1:00 PM  Oncology Nurse Navigator Flowsheets  Navigator Location [facility]  Navigator Encounter Type Molecular Studies  Patient Visit Type MedOnc  Treatment Phase Active Tx  Interventions Coordination of Care  Acuity Level 2-Minimal Needs (1-2 Barriers Identified)  Coordination of Care Pathology  Time Spent with Patient 30

## 2022-09-22 ENCOUNTER — Inpatient Hospital Stay: Payer: Medicare Other

## 2022-09-22 VITALS — BP 97/51 | HR 59 | Temp 97.8°F | Resp 18

## 2022-09-22 DIAGNOSIS — Z8673 Personal history of transient ischemic attack (TIA), and cerebral infarction without residual deficits: Secondary | ICD-10-CM | POA: Diagnosis not present

## 2022-09-22 DIAGNOSIS — I82432 Acute embolism and thrombosis of left popliteal vein: Secondary | ICD-10-CM | POA: Diagnosis not present

## 2022-09-22 DIAGNOSIS — Z5112 Encounter for antineoplastic immunotherapy: Secondary | ICD-10-CM | POA: Diagnosis not present

## 2022-09-22 DIAGNOSIS — Z7901 Long term (current) use of anticoagulants: Secondary | ICD-10-CM | POA: Diagnosis not present

## 2022-09-22 DIAGNOSIS — R188 Other ascites: Secondary | ICD-10-CM | POA: Diagnosis not present

## 2022-09-22 DIAGNOSIS — I82452 Acute embolism and thrombosis of left peroneal vein: Secondary | ICD-10-CM | POA: Diagnosis not present

## 2022-09-22 DIAGNOSIS — Z5111 Encounter for antineoplastic chemotherapy: Secondary | ICD-10-CM | POA: Diagnosis not present

## 2022-09-22 DIAGNOSIS — C541 Malignant neoplasm of endometrium: Secondary | ICD-10-CM

## 2022-09-22 DIAGNOSIS — Z79899 Other long term (current) drug therapy: Secondary | ICD-10-CM | POA: Diagnosis not present

## 2022-09-22 MED ORDER — SODIUM CHLORIDE 0.9 % IV SOLN
150.0000 mg | Freq: Once | INTRAVENOUS | Status: AC
  Administered 2022-09-22: 150 mg via INTRAVENOUS
  Filled 2022-09-22: qty 150

## 2022-09-22 MED ORDER — SODIUM CHLORIDE 0.9 % IV SOLN: Freq: Once | INTRAVENOUS | Status: AC

## 2022-09-22 MED ORDER — POTASSIUM CHLORIDE IN NACL 20-0.9 MEQ/L-% IV SOLN
Freq: Once | INTRAVENOUS | Status: AC
  Filled 2022-09-22: qty 1000

## 2022-09-22 MED ORDER — MAGNESIUM SULFATE 2 GM/50ML IV SOLN
2.0000 g | Freq: Once | INTRAVENOUS | Status: AC
  Administered 2022-09-22: 2 g via INTRAVENOUS
  Filled 2022-09-22: qty 50

## 2022-09-22 MED ORDER — SODIUM CHLORIDE 0.9 % IV SOLN
900.0000 mg/m2 | Freq: Once | INTRAVENOUS | Status: AC
  Administered 2022-09-22: 1520 mg via INTRAVENOUS
  Filled 2022-09-22: qty 25.98

## 2022-09-22 MED ORDER — PALONOSETRON HCL INJECTION 0.25 MG/5ML
0.2500 mg | Freq: Once | INTRAVENOUS | Status: AC
  Administered 2022-09-22: 0.25 mg via INTRAVENOUS
  Filled 2022-09-22: qty 5

## 2022-09-22 MED ORDER — PROCHLORPERAZINE MALEATE 10 MG PO TABS
10.0000 mg | ORAL_TABLET | Freq: Four times a day (QID) | ORAL | 1 refills | Status: DC | PRN
Start: 2022-09-22 — End: 2022-10-06

## 2022-09-22 MED ORDER — COLD PACK MISC ONCOLOGY: 1.0000 | Freq: Once | Status: DC | PRN

## 2022-09-22 MED ORDER — SODIUM CHLORIDE 0.9 % IV SOLN: Freq: Once | INTRAVENOUS | Status: DC

## 2022-09-22 MED ORDER — SODIUM CHLORIDE 0.9 % IV SOLN
10.0000 mg | Freq: Once | INTRAVENOUS | Status: AC
  Administered 2022-09-22: 10 mg via INTRAVENOUS
  Filled 2022-09-22: qty 10

## 2022-09-22 MED ORDER — ONDANSETRON HCL 8 MG PO TABS
8.0000 mg | ORAL_TABLET | Freq: Three times a day (TID) | ORAL | 1 refills | Status: DC | PRN
Start: 2022-09-22 — End: 2022-11-03

## 2022-09-22 MED ORDER — LIDOCAINE-PRILOCAINE 2.5-2.5 % EX CREA
TOPICAL_CREAM | CUTANEOUS | 3 refills | Status: DC
Start: 2022-09-22 — End: 2022-10-13

## 2022-09-22 MED ORDER — SODIUM CHLORIDE 0.9% FLUSH
10.0000 mL | INTRAVENOUS | Status: DC | PRN
  Administered 2022-09-22: 10 mL

## 2022-09-22 MED ORDER — HEPARIN SOD (PORK) LOCK FLUSH 100 UNIT/ML IV SOLN
500.0000 [IU] | Freq: Once | INTRAVENOUS | Status: AC | PRN
  Administered 2022-09-22: 500 [IU]

## 2022-09-22 MED ORDER — DEXAMETHASONE 4 MG PO TABS
ORAL_TABLET | ORAL | 1 refills | Status: DC
Start: 2022-09-22 — End: 2023-01-29

## 2022-09-22 MED ORDER — SODIUM CHLORIDE 0.9 % IV SOLN
63.0000 mg/m2 | Freq: Once | INTRAVENOUS | Status: AC
  Administered 2022-09-22: 100 mg via INTRAVENOUS
  Filled 2022-09-22: qty 100

## 2022-09-22 NOTE — Patient Instructions (Signed)
[facility]  Discharge Instructions: Thank you for choosing [facility] to provide your oncology and hematology care.   If you have a lab appointment with the Cancer Center, please go directly to the Cancer Center and check in at the registration area.  Wear comfortable clothing and clothing appropriate for easy access to any Portacath or PICC line.   We strive to give you quality time with your provider. You may need to reschedule your appointment if you arrive late (15 or more minutes).  Arriving late affects you and other patients whose appointments are after yours.  Also, if you miss three or more appointments without notifying the office, you may be dismissed from the clinic at the provider's discretion.      For prescription refill requests, have your pharmacy contact our office and allow 72 hours for refills to be completed.    Today you received the following chemotherapy and/or immunotherapy agents Gemzar, Cisplatin       To help prevent nausea and vomiting after your treatment, we encourage you to take your nausea medication as directed.  BELOW ARE SYMPTOMS THAT SHOULD BE REPORTED IMMEDIATELY: *FEVER GREATER THAN 100.4 F (38 C) OR HIGHER *CHILLS OR SWEATING *NAUSEA AND VOMITING THAT IS NOT CONTROLLED WITH YOUR NAUSEA MEDICATION *UNUSUAL SHORTNESS OF BREATH *UNUSUAL BRUISING OR BLEEDING *URINARY PROBLEMS (pain or burning when urinating, or frequent urination) *BOWEL PROBLEMS (unusual diarrhea, constipation, pain near the anus) TENDERNESS IN MOUTH AND THROAT WITH OR WITHOUT PRESENCE OF ULCERS (sore throat, sores in mouth, or a toothache) UNUSUAL RASH, SWELLING OR PAIN  UNUSUAL VAGINAL DISCHARGE OR ITCHING   Items with * indicate a potential emergency and should be followed up as soon as possible or go to the Emergency Department if any problems should occur.  Please show the CHEMOTHERAPY ALERT CARD or IMMUNOTHERAPY ALERT CARD  at check-in to the Emergency Department and triage nurse. Should you have questions after your visit or need to cancel or reschedule your appointment, please contact [facility]  [phone] and follow the prompts.  Office hours are 8:00 a.m. to 4:30 p.m. Monday - Friday. Please note that voicemails left after 4:00 p.m. may not be returned until the following business day.  We are closed weekends and major holidays. You have access to a nurse at all times for urgent questions. Please call the main number to the clinic [phone] and follow the prompts.  For any non-urgent questions, you may also contact your provider using MyChart. We now offer e-Visits for anyone 41 and older to request care online for non-urgent symptoms. For details visit [email or web address].   Also download the MyChart app! Go to the app store, search "MyChart", open the app, select [facility], and log in with your MyChart username and password.

## 2022-09-24 ENCOUNTER — Other Ambulatory Visit: Payer: Self-pay

## 2022-09-29 ENCOUNTER — Inpatient Hospital Stay: Payer: Medicare Other

## 2022-09-29 VITALS — BP 98/61 | HR 55 | Resp 17

## 2022-09-29 VITALS — BP 110/50 | HR 71 | Temp 97.9°F | Resp 18 | Ht 66.0 in | Wt 122.0 lb

## 2022-09-29 DIAGNOSIS — I82432 Acute embolism and thrombosis of left popliteal vein: Secondary | ICD-10-CM | POA: Diagnosis not present

## 2022-09-29 DIAGNOSIS — Z95828 Presence of other vascular implants and grafts: Secondary | ICD-10-CM

## 2022-09-29 DIAGNOSIS — Z5111 Encounter for antineoplastic chemotherapy: Secondary | ICD-10-CM | POA: Diagnosis not present

## 2022-09-29 DIAGNOSIS — Z5112 Encounter for antineoplastic immunotherapy: Secondary | ICD-10-CM | POA: Diagnosis not present

## 2022-09-29 DIAGNOSIS — I82452 Acute embolism and thrombosis of left peroneal vein: Secondary | ICD-10-CM | POA: Diagnosis not present

## 2022-09-29 DIAGNOSIS — Z8673 Personal history of transient ischemic attack (TIA), and cerebral infarction without residual deficits: Secondary | ICD-10-CM | POA: Diagnosis not present

## 2022-09-29 DIAGNOSIS — C541 Malignant neoplasm of endometrium: Secondary | ICD-10-CM

## 2022-09-29 DIAGNOSIS — Z79899 Other long term (current) drug therapy: Secondary | ICD-10-CM | POA: Diagnosis not present

## 2022-09-29 DIAGNOSIS — R188 Other ascites: Secondary | ICD-10-CM | POA: Diagnosis not present

## 2022-09-29 DIAGNOSIS — Z7901 Long term (current) use of anticoagulants: Secondary | ICD-10-CM | POA: Diagnosis not present

## 2022-09-29 LAB — CMP (CANCER CENTER ONLY)
ALT: 18 U/L (ref 0–44)
AST: 21 U/L (ref 15–41)
Albumin: 4.1 g/dL (ref 3.5–5.0)
Alkaline Phosphatase: 45 U/L (ref 38–126)
Anion gap: 10 (ref 5–15)
BUN: 10 mg/dL (ref 8–23)
CO2: 26 mmol/L (ref 22–32)
Calcium: 8.9 mg/dL (ref 8.9–10.3)
Chloride: 105 mmol/L (ref 98–111)
Creatinine: 0.68 mg/dL (ref 0.44–1.00)
GFR, Estimated: >60 mL/min (ref >60)
Glucose, Bld: 82 mg/dL (ref 70–99)
Potassium: 3.9 mmol/L (ref 3.5–5.1)
Sodium: 141 mmol/L (ref 135–145)
Total Bilirubin: 0.4 mg/dL (ref 0.3–1.2)
Total Protein: 6.4 g/dL — ABNORMAL LOW (ref 6.5–8.1)

## 2022-09-29 LAB — CBC WITH DIFFERENTIAL (CANCER CENTER ONLY)
Abs Immature Granulocytes: 0.01 10*3/uL (ref 0.00–0.07)
Basophils Absolute: 0 10*3/uL (ref 0.0–0.1)
Basophils Relative: 1 %
Eosinophils Absolute: 0 10*3/uL (ref 0.0–0.5)
Eosinophils Relative: 1 %
HCT: 31 % — ABNORMAL LOW (ref 36.0–46.0)
Hemoglobin: 10.3 g/dL — ABNORMAL LOW (ref 12.0–15.0)
Immature Granulocytes: 1 %
Lymphocytes Relative: 44 %
Lymphs Abs: 0.9 10*3/uL (ref 0.7–4.0)
MCH: 30 pg (ref 26.0–34.0)
MCHC: 33.2 g/dL (ref 30.0–36.0)
MCV: 90.4 fL (ref 80.0–100.0)
Monocytes Absolute: 0.1 10*3/uL (ref 0.1–1.0)
Monocytes Relative: 6 %
Neutro Abs: 1 10*3/uL — ABNORMAL LOW (ref 1.7–7.7)
Neutrophils Relative %: 47 %
Platelet Count: 239 10*3/uL (ref 150–400)
RBC: 3.43 MIL/uL — ABNORMAL LOW (ref 3.87–5.11)
RDW: 12.3 % (ref 11.5–15.5)
WBC Count: 2 10*3/uL — ABNORMAL LOW (ref 4.0–10.5)
nRBC: 0 % (ref 0.0–0.2)

## 2022-09-29 MED ORDER — SODIUM CHLORIDE 0.9 % IV SOLN: Freq: Once | INTRAVENOUS | Status: AC

## 2022-09-29 MED ORDER — SODIUM CHLORIDE 0.9% FLUSH
10.0000 mL | INTRAVENOUS | Status: DC | PRN
  Administered 2022-09-29: 10 mL

## 2022-09-29 MED ORDER — HEPARIN SOD (PORK) LOCK FLUSH 100 UNIT/ML IV SOLN
500.0000 [IU] | Freq: Once | INTRAVENOUS | Status: AC | PRN
  Administered 2022-09-29: 500 [IU]

## 2022-09-29 MED ORDER — SODIUM CHLORIDE 0.9% FLUSH
10.0000 mL | Freq: Once | INTRAVENOUS | Status: AC
  Administered 2022-09-29: 10 mL via INTRAVENOUS

## 2022-09-29 MED ORDER — PROCHLORPERAZINE MALEATE 10 MG PO TABS: 10.0000 mg | ORAL_TABLET | Freq: Once | ORAL | Status: DC

## 2022-09-29 MED ORDER — SODIUM CHLORIDE 0.9 % IV SOLN
400.0000 mg | Freq: Once | INTRAVENOUS | Status: AC
  Administered 2022-09-29: 400 mg via INTRAVENOUS
  Filled 2022-09-29: qty 16

## 2022-09-29 NOTE — Progress Notes (Signed)
Per Dr. Myna Hidalgo, d/t labs today, will hold Gemzar, only administer Keytruda.

## 2022-09-29 NOTE — Patient Instructions (Signed)

## 2022-09-29 NOTE — Patient Instructions (Signed)
[facility]  Discharge Instructions: Thank you for choosing [facility] to provide your oncology and hematology care.   If you have a lab appointment with the Cancer Center, please go directly to the Cancer Center and check in at the registration area.  Wear comfortable clothing and clothing appropriate for easy access to any Portacath or PICC line.   We strive to give you quality time with your provider. You may need to reschedule your appointment if you arrive late (15 or more minutes).  Arriving late affects you and other patients whose appointments are after yours.  Also, if you miss three or more appointments without notifying the office, you may be dismissed from the clinic at the provider's discretion.      For prescription refill requests, have your pharmacy contact our office and allow 72 hours for refills to be completed.    Today you received the following chemotherapy and/or immunotherapy agents Keytruda      To help prevent nausea and vomiting after your treatment, we encourage you to take your nausea medication as directed.  BELOW ARE SYMPTOMS THAT SHOULD BE REPORTED IMMEDIATELY: *FEVER GREATER THAN 100.4 F (38 C) OR HIGHER *CHILLS OR SWEATING *NAUSEA AND VOMITING THAT IS NOT CONTROLLED WITH YOUR NAUSEA MEDICATION *UNUSUAL SHORTNESS OF BREATH *UNUSUAL BRUISING OR BLEEDING *URINARY PROBLEMS (pain or burning when urinating, or frequent urination) *BOWEL PROBLEMS (unusual diarrhea, constipation, pain near the anus) TENDERNESS IN MOUTH AND THROAT WITH OR WITHOUT PRESENCE OF ULCERS (sore throat, sores in mouth, or a toothache) UNUSUAL RASH, SWELLING OR PAIN  UNUSUAL VAGINAL DISCHARGE OR ITCHING   Items with * indicate a potential emergency and should be followed up as soon as possible or go to the Emergency Department if any problems should occur.  Please show the CHEMOTHERAPY ALERT CARD or IMMUNOTHERAPY ALERT CARD at check-in  to the Emergency Department and triage nurse. Should you have questions after your visit or need to cancel or reschedule your appointment, please contact [facility]  [phone] and follow the prompts.  Office hours are 8:00 a.m. to 4:30 p.m. Monday - Friday. Please note that voicemails left after 4:00 p.m. may not be returned until the following business day.  We are closed weekends and major holidays. You have access to a nurse at all times for urgent questions. Please call the main number to the clinic [phone] and follow the prompts.  For any non-urgent questions, you may also contact your provider using MyChart. We now offer e-Visits for anyone 18 and older to request care online for non-urgent symptoms. For details visit [email or web address].   Also download the MyChart app! Go to the app store, search "MyChart", open the app, select [place], and log in with your MyChart username and password.   

## 2022-10-02 ENCOUNTER — Encounter (HOSPITAL_COMMUNITY): Payer: Self-pay | Admitting: Hematology & Oncology

## 2022-10-02 ENCOUNTER — Encounter: Payer: Self-pay | Admitting: Hematology & Oncology

## 2022-10-03 ENCOUNTER — Encounter: Payer: Self-pay | Admitting: Hematology & Oncology

## 2022-10-06 ENCOUNTER — Other Ambulatory Visit: Payer: Self-pay | Admitting: Hematology & Oncology

## 2022-10-06 DIAGNOSIS — C541 Malignant neoplasm of endometrium: Secondary | ICD-10-CM

## 2022-10-13 ENCOUNTER — Encounter: Payer: Self-pay | Admitting: Hematology & Oncology

## 2022-10-13 ENCOUNTER — Inpatient Hospital Stay: Payer: Medicare Other | Attending: Hematology & Oncology

## 2022-10-13 ENCOUNTER — Inpatient Hospital Stay: Payer: Medicare Other

## 2022-10-13 ENCOUNTER — Inpatient Hospital Stay: Payer: Medicare Other | Admitting: Hematology & Oncology

## 2022-10-13 ENCOUNTER — Telehealth: Payer: Self-pay

## 2022-10-13 VITALS — BP 106/69 | HR 60 | Resp 16

## 2022-10-13 VITALS — Wt 123.1 lb

## 2022-10-13 DIAGNOSIS — Z79899 Other long term (current) drug therapy: Secondary | ICD-10-CM | POA: Diagnosis not present

## 2022-10-13 DIAGNOSIS — C541 Malignant neoplasm of endometrium: Secondary | ICD-10-CM | POA: Insufficient documentation

## 2022-10-13 DIAGNOSIS — Z7901 Long term (current) use of anticoagulants: Secondary | ICD-10-CM

## 2022-10-13 DIAGNOSIS — C778 Secondary and unspecified malignant neoplasm of lymph nodes of multiple regions: Secondary | ICD-10-CM | POA: Diagnosis not present

## 2022-10-13 DIAGNOSIS — Z5189 Encounter for other specified aftercare: Secondary | ICD-10-CM | POA: Insufficient documentation

## 2022-10-13 DIAGNOSIS — Z5111 Encounter for antineoplastic chemotherapy: Secondary | ICD-10-CM | POA: Insufficient documentation

## 2022-10-13 LAB — CMP (CANCER CENTER ONLY)
ALT: 26 U/L (ref 0–44)
AST: 24 U/L (ref 15–41)
Albumin: 4.3 g/dL (ref 3.5–5.0)
Alkaline Phosphatase: 51 U/L (ref 38–126)
Anion gap: 12 (ref 5–15)
BUN: 10 mg/dL (ref 8–23)
CO2: 26 mmol/L (ref 22–32)
Calcium: 9.2 mg/dL (ref 8.9–10.3)
Chloride: 105 mmol/L (ref 98–111)
Creatinine: 0.63 mg/dL (ref 0.44–1.00)
GFR, Estimated: >60 mL/min (ref >60)
Glucose, Bld: 78 mg/dL (ref 70–99)
Potassium: 3.9 mmol/L (ref 3.5–5.1)
Sodium: 143 mmol/L (ref 135–145)
Total Bilirubin: 0.4 mg/dL (ref 0.3–1.2)
Total Protein: 6.9 g/dL (ref 6.5–8.1)

## 2022-10-13 LAB — CBC WITH DIFFERENTIAL (CANCER CENTER ONLY)
Abs Immature Granulocytes: 0.01 10*3/uL (ref 0.00–0.07)
Basophils Absolute: 0 10*3/uL (ref 0.0–0.1)
Basophils Relative: 1 %
Eosinophils Absolute: 0 10*3/uL (ref 0.0–0.5)
Eosinophils Relative: 2 %
HCT: 30.9 % — ABNORMAL LOW (ref 36.0–46.0)
Hemoglobin: 10 g/dL — ABNORMAL LOW (ref 12.0–15.0)
Immature Granulocytes: 0 %
Lymphocytes Relative: 56 %
Lymphs Abs: 1.5 10*3/uL (ref 0.7–4.0)
MCH: 30.2 pg (ref 26.0–34.0)
MCHC: 32.4 g/dL (ref 30.0–36.0)
MCV: 93.4 fL (ref 80.0–100.0)
Monocytes Absolute: 0.5 10*3/uL (ref 0.1–1.0)
Monocytes Relative: 19 %
Neutro Abs: 0.6 10*3/uL — ABNORMAL LOW (ref 1.7–7.7)
Neutrophils Relative %: 22 %
Platelet Count: 439 10*3/uL — ABNORMAL HIGH (ref 150–400)
RBC: 3.31 MIL/uL — ABNORMAL LOW (ref 3.87–5.11)
RDW: 14.6 % (ref 11.5–15.5)
Smear Review: NORMAL
WBC Count: 2.7 10*3/uL — ABNORMAL LOW (ref 4.0–10.5)
nRBC: 0 % (ref 0.0–0.2)

## 2022-10-13 LAB — D-DIMER, QUANTITATIVE: D-Dimer, Quant: 1.27 ug/mL-FEU — ABNORMAL HIGH (ref 0.00–0.50)

## 2022-10-13 LAB — LACTATE DEHYDROGENASE: LDH: 176 U/L (ref 98–192)

## 2022-10-13 MED ORDER — MAGNESIUM SULFATE 2 GM/50ML IV SOLN
2.0000 g | Freq: Once | INTRAVENOUS | Status: AC
  Administered 2022-10-13: 2 g via INTRAVENOUS
  Filled 2022-10-13: qty 50

## 2022-10-13 MED ORDER — POTASSIUM CHLORIDE IN NACL 20-0.9 MEQ/L-% IV SOLN
Freq: Once | INTRAVENOUS | Status: AC
  Filled 2022-10-13: qty 1000

## 2022-10-13 MED ORDER — HEPARIN SOD (PORK) LOCK FLUSH 100 UNIT/ML IV SOLN
500.0000 [IU] | Freq: Once | INTRAVENOUS | Status: AC | PRN
  Administered 2022-10-13: 500 [IU]

## 2022-10-13 MED ORDER — SODIUM CHLORIDE 0.9 % IV SOLN
63.0000 mg/m2 | Freq: Once | INTRAVENOUS | Status: AC
  Administered 2022-10-13: 100 mg via INTRAVENOUS
  Filled 2022-10-13: qty 100

## 2022-10-13 MED ORDER — PALONOSETRON HCL INJECTION 0.25 MG/5ML
0.2500 mg | Freq: Once | INTRAVENOUS | Status: AC
  Administered 2022-10-13: 0.25 mg via INTRAVENOUS
  Filled 2022-10-13: qty 5

## 2022-10-13 MED ORDER — SODIUM CHLORIDE 0.9% FLUSH
10.0000 mL | INTRAVENOUS | Status: DC | PRN
  Administered 2022-10-13: 10 mL

## 2022-10-13 MED ORDER — SODIUM CHLORIDE 0.9 % IV SOLN
150.0000 mg | Freq: Once | INTRAVENOUS | Status: AC
  Administered 2022-10-13: 150 mg via INTRAVENOUS
  Filled 2022-10-13: qty 150

## 2022-10-13 MED ORDER — SODIUM CHLORIDE 0.9 % IV SOLN: Freq: Once | INTRAVENOUS | Status: AC

## 2022-10-13 MED ORDER — SODIUM CHLORIDE 0.9 % IV SOLN
810.0000 mg/m2 | Freq: Once | INTRAVENOUS | Status: AC
  Administered 2022-10-13: 1368 mg via INTRAVENOUS
  Filled 2022-10-13: qty 25.98

## 2022-10-13 MED ORDER — SODIUM CHLORIDE 0.9 % IV SOLN
10.0000 mg | Freq: Once | INTRAVENOUS | Status: AC
  Administered 2022-10-13: 10 mg via INTRAVENOUS
  Filled 2022-10-13: qty 10

## 2022-10-13 NOTE — Patient Instructions (Signed)
[facility]  Discharge Instructions: Thank you for choosing [facility] to provide your oncology and hematology care.   If you have a lab appointment with the Cancer Center, please go directly to the Cancer Center and check in at the registration area.  Wear comfortable clothing and clothing appropriate for easy access to any Portacath or PICC line.   We strive to give you quality time with your provider. You may need to reschedule your appointment if you arrive late (15 or more minutes).  Arriving late affects you and other patients whose appointments are after yours.  Also, if you miss three or more appointments without notifying the office, you may be dismissed from the clinic at the provider's discretion.      For prescription refill requests, have your pharmacy contact our office and allow 72 hours for refills to be completed.    Today you received the following chemotherapy and/or immunotherapy agents Cisplatin, Gemzar      To help prevent nausea and vomiting after your treatment, we encourage you to take your nausea medication as directed.  BELOW ARE SYMPTOMS THAT SHOULD BE REPORTED IMMEDIATELY: *FEVER GREATER THAN 100.4 F (38 C) OR HIGHER *CHILLS OR SWEATING *NAUSEA AND VOMITING THAT IS NOT CONTROLLED WITH YOUR NAUSEA MEDICATION *UNUSUAL SHORTNESS OF BREATH *UNUSUAL BRUISING OR BLEEDING *URINARY PROBLEMS (pain or burning when urinating, or frequent urination) *BOWEL PROBLEMS (unusual diarrhea, constipation, pain near the anus) TENDERNESS IN MOUTH AND THROAT WITH OR WITHOUT PRESENCE OF ULCERS (sore throat, sores in mouth, or a toothache) UNUSUAL RASH, SWELLING OR PAIN  UNUSUAL VAGINAL DISCHARGE OR ITCHING   Items with * indicate a potential emergency and should be followed up as soon as possible or go to the Emergency Department if any problems should occur.  Please show the CHEMOTHERAPY ALERT CARD or IMMUNOTHERAPY ALERT CARD at  check-in to the Emergency Department and triage nurse. Should you have questions after your visit or need to cancel or reschedule your appointment, please contact [facility]  [phone] and follow the prompts.  Office hours are 8:00 a.m. to 4:30 p.m. Monday - Friday. Please note that voicemails left after 4:00 p.m. may not be returned until the following business day.  We are closed weekends and major holidays. You have access to a nurse at all times for urgent questions. Please call the main number to the clinic [phone] and follow the prompts.  For any non-urgent questions, you may also contact your provider using MyChart. We now offer e-Visits for anyone 61 and older to request care online for non-urgent symptoms. For details visit [email or web address].   Also download the MyChart app! Go to the app store, search "MyChart", open the app, select [facility], and log in with your MyChart username and password.

## 2022-10-13 NOTE — Progress Notes (Signed)
Hematology and Oncology Follow Up Visit  Leiyah Canizalez [id number] 01-09-1961 62 y.o. 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 Meckel is back for follow-up.  I think she did quite nicely with her first cycle of chemotherapy.  It will be very interesting to see how the CA 125 looks.   Of note, we did molecular testing, we found that she had numerous molecular abnormalities.  She had a very high TMB which would indicate that she should respond to immunotherapy.  She has not noted any additional fluid from recurring in the abdomen.  She really has shown a lot of tough news.  She is also doing complementary treatments.  She does see integrative care physicians who are also helping her.  She continues on the Arixtra for thromboembolic disease.  This really is how we found that she had recurrence of her gynecologic cancer.  Of note, we probably can have to get her on some type of G-CSF.  Her white cell count so is not as high as I would like.  I want to try to make sure that we were able to give her the chemotherapy that we need.  She has had no cough.  There is been no mouth sores.  She has had no headache.  She has had no bleeding.  Overall, I would have said that her performance status is probably ECOG 1.   Medications:  Current Outpatient Medications:    ARMOUR THYROID 90 MG tablet, Take 90 mg by mouth every morning., Disp: , Rfl:    aspirin 81 MG chewable tablet, Chew 1 tablet by mouth daily., Disp: , Rfl:    atorvastatin (LIPITOR) 40 MG tablet, 20 mg daily., Disp: , Rfl:    dexamethasone (DECADRON) 4 MG tablet, Take 2 tablets (8 mg) by  mouth daily x 3 days starting the day after cisplatin chemotherapy. Take with food., Disp: 30 tablet, Rfl: 1   fondaparinux (ARIXTRA) 7.5 MG/0.6ML SOLN injection, Inject 0.6 mLs (7.5 mg total) into the skin daily., Disp: 18 mL, Rfl: 4   lidocaine-prilocaine (EMLA) cream, APPLY TOPICALLY TO THE AFFECTED AREA 1 TIME, Disp: 30 g, Rfl: 4   Naltrexone HCl, Pain, (NALTREX) 4.5 MG CAPS, Take by mouth at bedtime., Disp: , Rfl:    ondansetron (ZOFRAN) 8 MG tablet, Take 1 tablet (8 mg total) by mouth every 8 (eight) hours as needed for nausea or vomiting. Start on the third day after cisplatin., Disp: 30 tablet, Rfl: 1   Probiotic Product (PROBIOTIC BLEND PO), Take by mouth daily., Disp: , Rfl:    prochlorperazine (COMPAZINE) 10 MG tablet, TAKE 1 TABLET(10 MG) BY MOUTH EVERY 6 HOURS AS NEEDED FOR NAUSEA OR VOMITING, Disp: 30 tablet, Rfl: 1   cimetidine (TAGAMET) 400 MG tablet, Take 400 mg by mouth 2 (two) times daily. (Patient not taking: Reported on 10/13/2022), Disp: , Rfl:    doxycycline (MONODOX) 100 MG capsule, Take 100 mg by mouth 2 (two) times daily. (Patient not taking: Reported on 10/13/2022), Disp: , Rfl:    doxycycline (VIBRAMYCIN) 100 MG capsule, Take 100 mg by mouth daily. 08/31/2022 Takes for 30 days/off 30  days. (Patient not taking: Reported on 10/13/2022), Disp: , Rfl:    etodolac (LODINE) 200 MG capsule, Take 200 mg by mouth 2 (two) times daily. (Patient not taking: Reported on 10/13/2022), Disp: , Rfl:    itraconazole (SPORANOX) 100 MG capsule, Take 100 mg by mouth 2 (two) times daily. (Patient not taking: Reported on 09/19/2022), Disp: , Rfl:    lovastatin (MEVACOR) 20 MG tablet, Take 20 mg by mouth at bedtime. (Patient not taking: Reported on 10/13/2022), Disp: , Rfl:    metFORMIN (GLUCOPHAGE) 500 MG tablet, Take 500 mg by mouth 2 (two) times daily. (Patient not taking: Reported on 10/13/2022), Disp: , Rfl:   Allergies:  Allergies  Allergen Reactions   Bee Venom Anaphylaxis, Swelling and Other (See  Comments)   Adhesive [Tape] Other (See Comments)    Irritation and red   Aspartame Diarrhea   Aspartame And Phenylalanine Diarrhea    Past Medical History, Surgical history, Social history, and Family History were reviewed and updated.  Review of Systems: Review of Systems  Constitutional: Negative.   HENT: Negative.    Eyes: Negative.   Respiratory: Negative.    Cardiovascular: Negative.   Gastrointestinal:  Positive for abdominal pain.  Genitourinary: Negative.   Musculoskeletal: Negative.   Skin: Negative.   Neurological: Negative.   Endo/Heme/Allergies: Negative.   Psychiatric/Behavioral: Negative.      Physical Exam: Her vital signs show temperature of 97.5.  Pulse 60.  Blood pressure 103/53.  Weight is 123 pounds.   Wt Readings from Last 3 Encounters:  10/13/22 123 lb 1.3 oz (55.8 kg)  09/29/22 122 lb (55.3 kg)  09/19/22 128 lb 0.6 oz (58.1 kg)    Physical Exam Vitals reviewed.  HENT:     Head: Normocephalic and atraumatic.  Eyes:     Pupils: Pupils are equal, round, and reactive to light.  Cardiovascular:     Rate and Rhythm: Normal rate and regular rhythm.     Heart sounds: Normal heart sounds.  Pulmonary:     Effort: Pulmonary effort is normal.     Breath sounds: Normal breath sounds.  Abdominal:     General: Bowel sounds are normal.     Palpations: Abdomen is soft.     Comments: Abdominal exam shows a soft abdomen.  She has well healing laparoscopic scars.  I think there are 5 laparoscopic scars.  She has no erythema or exudate or warmth associated with these.  She has no fluid wave in the abdomen.  There is no guarding or rebound tenderness.  She has no palpable liver or spleen tip.  Musculoskeletal:        General: No tenderness or deformity. Normal range of motion.     Cervical back: Normal range of motion.  Lymphadenopathy:     Cervical: No cervical adenopathy.  Skin:    General: Skin is warm and dry.     Findings: No erythema or rash.   Neurological:     Mental Status: She is alert and oriented to person, place, and time.  Psychiatric:        Behavior: Behavior normal.        Thought Content: Thought content normal.        Judgment: Judgment normal.     Lab Results  Component Value Date   WBC 2.7 (L) 10/13/2022   HGB 10.0 (L) 10/13/2022   HCT 30.9 (L) 10/13/2022   MCV 93.4 10/13/2022   PLT 439 (H) 10/13/2022     Chemistry  Component Value Date/Time   NA 141 09/29/2022 0811   NA 142 02/15/2017 1518   NA 140 04/13/2016 1055   K 3.9 09/29/2022 0811   K 4.1 02/15/2017 1518   K 4.1 04/13/2016 1055   CL 105 09/29/2022 0811   CL 107 02/15/2017 1518   CO2 26 09/29/2022 0811   CO2 29 02/15/2017 1518   CO2 27 04/13/2016 1055   BUN 10 09/29/2022 0811   BUN 17 02/15/2017 1518   BUN 22.1 04/13/2016 1055   CREATININE 0.68 09/29/2022 0811   CREATININE 1.0 02/15/2017 1518   CREATININE 0.8 04/13/2016 1055      Component Value Date/Time   CALCIUM 8.9 09/29/2022 0811   CALCIUM 9.3 02/15/2017 1518   CALCIUM 9.9 04/13/2016 1055   ALKPHOS 45 09/29/2022 0811   ALKPHOS 79 02/15/2017 1518   ALKPHOS 81 04/13/2016 1055   AST 21 09/29/2022 0811   AST 20 04/13/2016 1055   ALT 18 09/29/2022 0811   ALT 24 02/15/2017 1518   ALT 22 04/13/2016 1055   BILITOT 0.4 09/29/2022 0811   BILITOT 0.44 04/13/2016 1055      Impression and Plan: Ms. Marcial is a 62 year old Caucasian female.  She has another recurrence of her gynecologic malignancy.  We now have her on therapy.  She is on chemotherapy with immunotherapy.  Again she wants to also incorporate complementary agents.  I do not have a problem with this.  She does use integrative doctors for this.  Again, I think that the CA-125 will tell us how she is doing.  We will add G-CSF to the protocol so that we can try to keep her white cell count up a little bit better.  We will set her up with another PET scan after this cycle of treatment.  Hopefully, we will see that  she is responding.  She really has done a great job.  She has so much support from her family.  She is incredibly motivated.  We will plan to get her back to see [place] in another 3-4 weeks.   Josph Macho, MD 5/3/20248:48 AM

## 2022-10-13 NOTE — Patient Instructions (Signed)

## 2022-10-13 NOTE — Progress Notes (Signed)
Reviewed all lab results with Dr Myna Hidalgo.  Ok to treat with ANC of .6.  Dr Myna Hidalgo would like to give Neulasta on Monday  Urine output 500 cc at 0930a

## 2022-10-13 NOTE — Telephone Encounter (Signed)
Discussed with MD in office 5/3.

## 2022-10-14 ENCOUNTER — Other Ambulatory Visit: Payer: Self-pay

## 2022-10-14 LAB — CA 125: Cancer Antigen (CA) 125: 403 U/mL — ABNORMAL HIGH (ref 0.0–38.1)

## 2022-10-15 ENCOUNTER — Encounter: Payer: Self-pay | Admitting: Hematology & Oncology

## 2022-10-16 ENCOUNTER — Inpatient Hospital Stay: Payer: Medicare Other

## 2022-10-16 ENCOUNTER — Telehealth: Payer: Self-pay

## 2022-10-16 VITALS — BP 111/59 | HR 62 | Temp 96.7°F | Resp 17

## 2022-10-16 DIAGNOSIS — C541 Malignant neoplasm of endometrium: Secondary | ICD-10-CM

## 2022-10-16 DIAGNOSIS — C778 Secondary and unspecified malignant neoplasm of lymph nodes of multiple regions: Secondary | ICD-10-CM | POA: Diagnosis not present

## 2022-10-16 DIAGNOSIS — Z5189 Encounter for other specified aftercare: Secondary | ICD-10-CM | POA: Diagnosis not present

## 2022-10-16 DIAGNOSIS — Z79899 Other long term (current) drug therapy: Secondary | ICD-10-CM | POA: Diagnosis not present

## 2022-10-16 DIAGNOSIS — Z5111 Encounter for antineoplastic chemotherapy: Secondary | ICD-10-CM | POA: Diagnosis not present

## 2022-10-16 MED ORDER — FILGRASTIM-SNDZ 480 MCG/0.8ML IJ SOSY
480.0000 ug | PREFILLED_SYRINGE | Freq: Once | INTRAMUSCULAR | Status: AC
  Administered 2022-10-16: 480 ug via SUBCUTANEOUS
  Filled 2022-10-16: qty 0.8

## 2022-10-16 NOTE — Telephone Encounter (Signed)
Advised via MyChart.

## 2022-10-16 NOTE — Patient Instructions (Signed)
Filgrastim Injection What is this medication? FILGRASTIM (fil GRA stim) lowers the risk of infection in people who are receiving chemotherapy. It works by helping your body make more white blood cells, which protects your body from infection. It may also be used to help people who have been exposed to high doses of radiation. It can be used to help prepare your body before a stem cell transplant. It works by helping your bone marrow make and release stem cells into the blood. This medicine may be used for other purposes; ask your health care provider or pharmacist if you have questions. COMMON BRAND NAME(S): Neupogen, Nivestym, Releuko, Zarxio What should I tell my care team before I take this medication? They need to know if you have any of these conditions: History of blood diseases, such as sickle cell anemia Kidney disease Recent or ongoing radiation An unusual or allergic reaction to filgrastim, pegfilgrastim, latex, rubber, other medications, foods, dyes, or preservatives Pregnant or trying to get pregnant Breast-feeding How should I use this medication? This medication is injected under the skin or into a vein. It is usually given by your care team in a hospital or clinic setting. It may be given at home. If you get this medication at home, you will be taught how to prepare and give it. Use exactly as directed. Take it as directed on the prescription label at the same time every day. Keep taking it unless your care team tells you to stop. It is important that you put your used needles and syringes in a special sharps container. Do not put them in a trash can. If you do not have a sharps container, call your pharmacist or care team to get one. This medication comes with INSTRUCTIONS FOR USE. Ask your pharmacist for directions on how to use this medication. Read the information carefully. Talk to your pharmacist or care team if you have questions. Talk to your care team about the use of this  medication in children. While it may be prescribed for children for selected conditions, precautions do apply. Overdosage: If you think you have taken too much of this medicine contact a poison control center or emergency room at once. NOTE: This medicine is only for you. Do not share this medicine with others. What if I miss a dose? It is important not to miss any doses. Talk to your care team about what to do if you miss a dose. What may interact with this medication? Medications that may cause a release of neutrophils, such as lithium This list may not describe all possible interactions. Give your health care provider a list of all the medicines, herbs, non-prescription drugs, or dietary supplements you use. Also tell them if you smoke, drink alcohol, or use illegal drugs. Some items may interact with your medicine. What should I watch for while using this medication? Your condition will be monitored carefully while you are receiving this medication. You may need bloodwork while taking this medication. Talk to your care team about your risk of cancer. You may be more at risk for certain types of cancer if you take this medication. What side effects may I notice from receiving this medication? Side effects that you should report to your care team as soon as possible: Allergic reactions--skin rash, itching, hives, swelling of the face, lips, tongue, or throat Capillary leak syndrome--stomach or muscle pain, unusual weakness or fatigue, feeling faint or lightheaded, decrease in the amount of urine, swelling of the ankles, hands, or   feet, trouble breathing High white blood cell level--fever, fatigue, trouble breathing, night sweats, change in vision, weight loss Inflammation of the aorta--fever, fatigue, back, chest, or stomach pain, severe headache Kidney injury (glomerulonephritis)--decrease in the amount of urine, red or dark brown urine, foamy or bubbly urine, swelling of the ankles, hands, or  feet Shortness of breath or trouble breathing Spleen injury--pain in upper left stomach or shoulder Unusual bruising or bleeding Side effects that usually do not require medical attention (report to your care team if they continue or are bothersome): Back pain Bone pain Fatigue Fever Headache Nausea This list may not describe all possible side effects. Call your doctor for medical advice about side effects. You may report side effects to FDA at 1-800-FDA-1088. Where should I keep my medication? Keep out of the reach of children and pets. Keep this medication in the original packaging until you are ready to take it. Protect from light. See product for storage information. Each product may have different instructions. Get rid of any unused medication after the expiration date. To get rid of medications that are no longer needed or have expired: Take the medication to a medications take-back program. Check with your pharmacy or law enforcement to find a location. If you cannot return the medication, ask your pharmacist or care team how to get rid of this medication safely. NOTE: This sheet is a summary. It may not cover all possible information. If you have questions about this medicine, talk to your doctor, pharmacist, or health care provider.  2023 Elsevier/Gold Standard (2021-09-06 00:00:00)  

## 2022-10-16 NOTE — Telephone Encounter (Signed)
-----   Message from Josph Macho, MD sent at 10/16/2022  6:37 AM EDT ----- Call - thd tumor marker went from [phone] down to [phone]!!!!  Great job!!   Cindee Lame

## 2022-10-17 ENCOUNTER — Encounter: Payer: Self-pay | Admitting: *Deleted

## 2022-10-18 ENCOUNTER — Other Ambulatory Visit: Payer: Self-pay

## 2022-10-20 ENCOUNTER — Inpatient Hospital Stay: Payer: Medicare Other

## 2022-10-20 ENCOUNTER — Encounter: Payer: Self-pay | Admitting: Hematology & Oncology

## 2022-10-20 VITALS — BP 103/66 | HR 58 | Temp 97.5°F | Resp 18

## 2022-10-20 DIAGNOSIS — Z5111 Encounter for antineoplastic chemotherapy: Secondary | ICD-10-CM | POA: Diagnosis not present

## 2022-10-20 DIAGNOSIS — Z5189 Encounter for other specified aftercare: Secondary | ICD-10-CM | POA: Diagnosis not present

## 2022-10-20 DIAGNOSIS — C778 Secondary and unspecified malignant neoplasm of lymph nodes of multiple regions: Secondary | ICD-10-CM | POA: Diagnosis not present

## 2022-10-20 DIAGNOSIS — Z79899 Other long term (current) drug therapy: Secondary | ICD-10-CM | POA: Diagnosis not present

## 2022-10-20 DIAGNOSIS — C541 Malignant neoplasm of endometrium: Secondary | ICD-10-CM

## 2022-10-20 LAB — CBC WITH DIFFERENTIAL (CANCER CENTER ONLY)
Abs Immature Granulocytes: 0.2 10*3/uL — ABNORMAL HIGH (ref 0.00–0.07)
Basophils Absolute: 0 10*3/uL (ref 0.0–0.1)
Basophils Relative: 1 %
Eosinophils Absolute: 0 10*3/uL (ref 0.0–0.5)
Eosinophils Relative: 0 %
HCT: 32.3 % — ABNORMAL LOW (ref 36.0–46.0)
Hemoglobin: 10.6 g/dL — ABNORMAL LOW (ref 12.0–15.0)
Immature Granulocytes: 4 %
Lymphocytes Relative: 31 %
Lymphs Abs: 1.7 10*3/uL (ref 0.7–4.0)
MCH: 30.3 pg (ref 26.0–34.0)
MCHC: 32.8 g/dL (ref 30.0–36.0)
MCV: 92.3 fL (ref 80.0–100.0)
Monocytes Absolute: 0.4 10*3/uL (ref 0.1–1.0)
Monocytes Relative: 6 %
Neutro Abs: 3.2 10*3/uL (ref 1.7–7.7)
Neutrophils Relative %: 58 %
Platelet Count: 330 10*3/uL (ref 150–400)
RBC: 3.5 MIL/uL — ABNORMAL LOW (ref 3.87–5.11)
RDW: 14.1 % (ref 11.5–15.5)
WBC Count: 5.5 10*3/uL (ref 4.0–10.5)
nRBC: 0 % (ref 0.0–0.2)

## 2022-10-20 LAB — CMP (CANCER CENTER ONLY)
ALT: 34 U/L (ref 0–44)
AST: 24 U/L (ref 15–41)
Albumin: 4.6 g/dL (ref 3.5–5.0)
Alkaline Phosphatase: 55 U/L (ref 38–126)
Anion gap: 8 (ref 5–15)
BUN: 13 mg/dL (ref 8–23)
CO2: 28 mmol/L (ref 22–32)
Calcium: 9.7 mg/dL (ref 8.9–10.3)
Chloride: 102 mmol/L (ref 98–111)
Creatinine: 0.69 mg/dL (ref 0.44–1.00)
GFR, Estimated: >60 mL/min (ref >60)
Glucose, Bld: 102 mg/dL — ABNORMAL HIGH (ref 70–99)
Potassium: 4.2 mmol/L (ref 3.5–5.1)
Sodium: 138 mmol/L (ref 135–145)
Total Bilirubin: 0.4 mg/dL (ref 0.3–1.2)
Total Protein: 6.7 g/dL (ref 6.5–8.1)

## 2022-10-20 MED ORDER — PROCHLORPERAZINE MALEATE 10 MG PO TABS
10.0000 mg | ORAL_TABLET | Freq: Once | ORAL | Status: AC
  Administered 2022-10-20: 10 mg via ORAL
  Filled 2022-10-20: qty 1

## 2022-10-20 MED ORDER — SODIUM CHLORIDE 0.9 % IV SOLN
810.0000 mg/m2 | Freq: Once | INTRAVENOUS | Status: AC
  Administered 2022-10-20: 1368 mg via INTRAVENOUS
  Filled 2022-10-20: qty 25.98

## 2022-10-20 MED ORDER — HEPARIN SOD (PORK) LOCK FLUSH 100 UNIT/ML IV SOLN
500.0000 [IU] | Freq: Once | INTRAVENOUS | Status: AC | PRN
  Administered 2022-10-20: 500 [IU]

## 2022-10-20 MED ORDER — SODIUM CHLORIDE 0.9 % IV SOLN: 400.0000 mg | Freq: Once | INTRAVENOUS | Status: DC

## 2022-10-20 MED ORDER — SODIUM CHLORIDE 0.9% FLUSH
10.0000 mL | INTRAVENOUS | Status: DC | PRN
  Administered 2022-10-20: 10 mL

## 2022-10-20 MED ORDER — SODIUM CHLORIDE 0.9 % IV SOLN: Freq: Once | INTRAVENOUS | Status: AC

## 2022-10-20 NOTE — Progress Notes (Signed)
Keytruda not due today.   Removed from today's careplan per Dr. Gustavo Lah instructions.

## 2022-10-20 NOTE — Patient Instructions (Signed)

## 2022-10-21 ENCOUNTER — Other Ambulatory Visit: Payer: Self-pay | Admitting: Hematology & Oncology

## 2022-10-21 DIAGNOSIS — C541 Malignant neoplasm of endometrium: Secondary | ICD-10-CM

## 2022-10-23 ENCOUNTER — Inpatient Hospital Stay: Payer: Medicare Other

## 2022-10-23 VITALS — BP 101/60 | HR 66 | Temp 98.1°F | Resp 17

## 2022-10-23 DIAGNOSIS — C541 Malignant neoplasm of endometrium: Secondary | ICD-10-CM

## 2022-10-23 DIAGNOSIS — Z5189 Encounter for other specified aftercare: Secondary | ICD-10-CM | POA: Diagnosis not present

## 2022-10-23 DIAGNOSIS — C778 Secondary and unspecified malignant neoplasm of lymph nodes of multiple regions: Secondary | ICD-10-CM | POA: Diagnosis not present

## 2022-10-23 DIAGNOSIS — Z5111 Encounter for antineoplastic chemotherapy: Secondary | ICD-10-CM | POA: Diagnosis not present

## 2022-10-23 DIAGNOSIS — Z79899 Other long term (current) drug therapy: Secondary | ICD-10-CM | POA: Diagnosis not present

## 2022-10-23 MED ORDER — PEGFILGRASTIM INJECTION 6 MG/0.6ML ~~LOC~~
6.0000 mg | PREFILLED_SYRINGE | Freq: Once | SUBCUTANEOUS | Status: AC
  Administered 2022-10-23: 6 mg via SUBCUTANEOUS
  Filled 2022-10-23: qty 0.6

## 2022-10-23 NOTE — Patient Instructions (Signed)

## 2022-10-24 ENCOUNTER — Other Ambulatory Visit: Payer: Self-pay

## 2022-10-24 ENCOUNTER — Encounter (HOSPITAL_COMMUNITY): Payer: Self-pay

## 2022-10-24 ENCOUNTER — Encounter: Payer: Self-pay | Admitting: Hematology & Oncology

## 2022-10-24 ENCOUNTER — Encounter: Payer: Self-pay | Admitting: *Deleted

## 2022-11-03 ENCOUNTER — Other Ambulatory Visit: Payer: Self-pay | Admitting: *Deleted

## 2022-11-03 ENCOUNTER — Other Ambulatory Visit: Payer: Self-pay

## 2022-11-03 ENCOUNTER — Other Ambulatory Visit (HOSPITAL_BASED_OUTPATIENT_CLINIC_OR_DEPARTMENT_OTHER): Payer: Self-pay

## 2022-11-03 ENCOUNTER — Inpatient Hospital Stay: Payer: Medicare Other

## 2022-11-03 ENCOUNTER — Encounter: Payer: Self-pay | Admitting: Hematology & Oncology

## 2022-11-03 ENCOUNTER — Inpatient Hospital Stay: Payer: Medicare Other | Admitting: Hematology & Oncology

## 2022-11-03 VITALS — BP 119/67 | HR 78 | Resp 18

## 2022-11-03 VITALS — BP 100/59 | HR 69 | Temp 97.7°F | Resp 18 | Ht 66.0 in | Wt 124.0 lb

## 2022-11-03 DIAGNOSIS — C541 Malignant neoplasm of endometrium: Secondary | ICD-10-CM

## 2022-11-03 DIAGNOSIS — Z5111 Encounter for antineoplastic chemotherapy: Secondary | ICD-10-CM | POA: Diagnosis not present

## 2022-11-03 DIAGNOSIS — Z79899 Other long term (current) drug therapy: Secondary | ICD-10-CM | POA: Diagnosis not present

## 2022-11-03 DIAGNOSIS — Z5189 Encounter for other specified aftercare: Secondary | ICD-10-CM | POA: Diagnosis not present

## 2022-11-03 DIAGNOSIS — C778 Secondary and unspecified malignant neoplasm of lymph nodes of multiple regions: Secondary | ICD-10-CM | POA: Diagnosis not present

## 2022-11-03 LAB — CBC WITH DIFFERENTIAL (CANCER CENTER ONLY)
Abs Immature Granulocytes: 0.26 10*3/uL — ABNORMAL HIGH (ref 0.00–0.07)
Basophils Absolute: 0 10*3/uL (ref 0.0–0.1)
Basophils Relative: 0 %
Eosinophils Absolute: 0 10*3/uL (ref 0.0–0.5)
Eosinophils Relative: 0 %
HCT: 27.7 % — ABNORMAL LOW (ref 36.0–46.0)
Hemoglobin: 9 g/dL — ABNORMAL LOW (ref 12.0–15.0)
Immature Granulocytes: 3 %
Lymphocytes Relative: 17 %
Lymphs Abs: 1.4 10*3/uL (ref 0.7–4.0)
MCH: 31 pg (ref 26.0–34.0)
MCHC: 32.5 g/dL (ref 30.0–36.0)
MCV: 95.5 fL (ref 80.0–100.0)
Monocytes Absolute: 0.5 10*3/uL (ref 0.1–1.0)
Monocytes Relative: 6 %
Neutro Abs: 5.8 10*3/uL (ref 1.7–7.7)
Neutrophils Relative %: 74 %
Platelet Count: 257 10*3/uL (ref 150–400)
RBC: 2.9 MIL/uL — ABNORMAL LOW (ref 3.87–5.11)
RDW: 15.9 % — ABNORMAL HIGH (ref 11.5–15.5)
WBC Count: 8 10*3/uL (ref 4.0–10.5)
nRBC: 0 % (ref 0.0–0.2)

## 2022-11-03 LAB — CMP (CANCER CENTER ONLY)
ALT: 19 U/L (ref 0–44)
AST: 17 U/L (ref 15–41)
Albumin: 4.4 g/dL (ref 3.5–5.0)
Alkaline Phosphatase: 63 U/L (ref 38–126)
Anion gap: 9 (ref 5–15)
BUN: 16 mg/dL (ref 8–23)
CO2: 25 mmol/L (ref 22–32)
Calcium: 9 mg/dL (ref 8.9–10.3)
Chloride: 101 mmol/L (ref 98–111)
Creatinine: 0.72 mg/dL (ref 0.44–1.00)
GFR, Estimated: >60 mL/min (ref >60)
Glucose, Bld: 132 mg/dL — ABNORMAL HIGH (ref 70–99)
Potassium: 3.8 mmol/L (ref 3.5–5.1)
Sodium: 135 mmol/L (ref 135–145)
Total Bilirubin: 0.3 mg/dL (ref 0.3–1.2)
Total Protein: 6.1 g/dL — ABNORMAL LOW (ref 6.5–8.1)

## 2022-11-03 LAB — LACTATE DEHYDROGENASE: LDH: 203 U/L — ABNORMAL HIGH (ref 98–192)

## 2022-11-03 LAB — MAGNESIUM: Magnesium: 1.9 mg/dL (ref 1.7–2.4)

## 2022-11-03 MED ORDER — POTASSIUM CHLORIDE IN NACL 20-0.9 MEQ/L-% IV SOLN
Freq: Once | INTRAVENOUS | Status: AC
  Filled 2022-11-03: qty 1000

## 2022-11-03 MED ORDER — SODIUM CHLORIDE 0.9 % IV SOLN
150.0000 mg | Freq: Once | INTRAVENOUS | Status: AC
  Administered 2022-11-03: 150 mg via INTRAVENOUS
  Filled 2022-11-03: qty 150

## 2022-11-03 MED ORDER — SODIUM CHLORIDE 0.9% FLUSH
10.0000 mL | INTRAVENOUS | Status: DC | PRN
  Administered 2022-11-03: 10 mL

## 2022-11-03 MED ORDER — SODIUM CHLORIDE 0.9 % IV SOLN: Freq: Once | INTRAVENOUS | Status: DC

## 2022-11-03 MED ORDER — PALONOSETRON HCL INJECTION 0.25 MG/5ML
0.2500 mg | Freq: Once | INTRAVENOUS | Status: AC
  Administered 2022-11-03: 0.25 mg via INTRAVENOUS
  Filled 2022-11-03: qty 5

## 2022-11-03 MED ORDER — SODIUM CHLORIDE 0.9 % IV SOLN: Freq: Once | INTRAVENOUS | Status: AC

## 2022-11-03 MED ORDER — ONDANSETRON HCL 8 MG PO TABS
8.0000 mg | ORAL_TABLET | Freq: Three times a day (TID) | ORAL | 1 refills | Status: DC | PRN
Start: 2022-11-03 — End: 2022-11-03

## 2022-11-03 MED ORDER — COLD PACK MISC ONCOLOGY: 1.0000 | Freq: Once | Status: DC | PRN

## 2022-11-03 MED ORDER — SODIUM CHLORIDE 0.9 % IV SOLN
10.0000 mg | Freq: Once | INTRAVENOUS | Status: AC
  Administered 2022-11-03: 10 mg via INTRAVENOUS
  Filled 2022-11-03: qty 10

## 2022-11-03 MED ORDER — SODIUM CHLORIDE 0.9 % IV SOLN
56.7000 mg/m2 | Freq: Once | INTRAVENOUS | Status: AC
  Administered 2022-11-03: 100 mg via INTRAVENOUS
  Filled 2022-11-03: qty 100

## 2022-11-03 MED ORDER — SODIUM CHLORIDE 0.9 % IV SOLN
729.0000 mg/m2 | Freq: Once | INTRAVENOUS | Status: AC
  Administered 2022-11-03: 1216 mg via INTRAVENOUS
  Filled 2022-11-03: qty 26.68

## 2022-11-03 MED ORDER — HEPARIN SOD (PORK) LOCK FLUSH 100 UNIT/ML IV SOLN
500.0000 [IU] | Freq: Once | INTRAVENOUS | Status: AC | PRN
  Administered 2022-11-03: 500 [IU]

## 2022-11-03 MED ORDER — OLANZAPINE 10 MG PO TABS
10.0000 mg | ORAL_TABLET | Freq: Every day | ORAL | 6 refills | Status: DC
  Filled 2022-11-03: qty 30, 30d supply, fill #0
  Filled 2022-11-27: qty 30, 30d supply, fill #1

## 2022-11-03 MED ORDER — ONDANSETRON HCL 8 MG PO TABS
8.0000 mg | ORAL_TABLET | Freq: Three times a day (TID) | ORAL | 4 refills | Status: DC | PRN
Start: 2022-11-03 — End: 2023-01-29
  Filled 2022-11-03: qty 60, 20d supply, fill #0

## 2022-11-03 MED ORDER — MAGNESIUM SULFATE 2 GM/50ML IV SOLN
2.0000 g | Freq: Once | INTRAVENOUS | Status: AC
  Administered 2022-11-03: 2 g via INTRAVENOUS
  Filled 2022-11-03: qty 50

## 2022-11-03 NOTE — Patient Instructions (Signed)
[facility]  Discharge Instructions: Thank you for choosing [facility] to provide your oncology and hematology care.   If you have a lab appointment with the Cancer Center, please go directly to the Cancer Center and check in at the registration area.  Wear comfortable clothing and clothing appropriate for easy access to any Portacath or PICC line.   We strive to give you quality time with your provider. You may need to reschedule your appointment if you arrive late (15 or more minutes).  Arriving late affects you and other patients whose appointments are after yours.  Also, if you miss three or more appointments without notifying the office, you may be dismissed from the clinic at the provider's discretion.      For prescription refill requests, have your pharmacy contact our office and allow 72 hours for refills to be completed.    Today you received the following chemotherapy and/or immunotherapy agents Gemzar, Cisplatin       To help prevent nausea and vomiting after your treatment, we encourage you to take your nausea medication as directed.  BELOW ARE SYMPTOMS THAT SHOULD BE REPORTED IMMEDIATELY: *FEVER GREATER THAN 100.4 F (38 C) OR HIGHER *CHILLS OR SWEATING *NAUSEA AND VOMITING THAT IS NOT CONTROLLED WITH YOUR NAUSEA MEDICATION *UNUSUAL SHORTNESS OF BREATH *UNUSUAL BRUISING OR BLEEDING *URINARY PROBLEMS (pain or burning when urinating, or frequent urination) *BOWEL PROBLEMS (unusual diarrhea, constipation, pain near the anus) TENDERNESS IN MOUTH AND THROAT WITH OR WITHOUT PRESENCE OF ULCERS (sore throat, sores in mouth, or a toothache) UNUSUAL RASH, SWELLING OR PAIN  UNUSUAL VAGINAL DISCHARGE OR ITCHING   Items with * indicate a potential emergency and should be followed up as soon as possible or go to the Emergency Department if any problems should occur.  Please show the CHEMOTHERAPY ALERT CARD or IMMUNOTHERAPY ALERT CARD  at check-in to the Emergency Department and triage nurse. Should you have questions after your visit or need to cancel or reschedule your appointment, please contact [facility]  [phone] and follow the prompts.  Office hours are 8:00 a.m. to 4:30 p.m. Monday - Friday. Please note that voicemails left after 4:00 p.m. may not be returned until the following business day.  We are closed weekends and major holidays. You have access to a nurse at all times for urgent questions. Please call the main number to the clinic [phone] and follow the prompts.  For any non-urgent questions, you may also contact your provider using MyChart. We now offer e-Visits for anyone 18 and older to request care online for non-urgent symptoms. For details visit [email or web address].   Also download the MyChart app! Go to the app store, search "MyChart", open the app, select [facility], and log in with your MyChart username and password.   

## 2022-11-03 NOTE — Progress Notes (Signed)
Patient complains of nausea. Aloxi given before fluids. OK to run fluids concomitant with Cisplatin per Dr. Myna Hidalgo.

## 2022-11-03 NOTE — Progress Notes (Signed)
Hematology and Oncology Follow Up Visit  Sherry Williams [id number] [birth year] 62 y.o. 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  She is feeling a little bit under the weather right now.  She like to have some of the treatment doses decreased little bit.  I think we can do this.  Her last CA-125 went from 2400 down to 400.  Hopefully, we will see another nice drop this time.  She has not had no abdominal pain.  She has had no change in bowel or bladder habits.  There is been no cough or shortness of breath.  She is utilizing her Integrative specialist.  She is helping Sherry Williams out quite a bit.  I think she is also helping Sherry Williams out with respect to toxicity side effects.  She is having quite a bit of nausea.  I will send in some olanzapine (10 mg p.o. nightly) to see if this may help with the nausea.    She has had no fever.  There is been no bleeding.  She is on Arixtra.  She also does take Persantine.  Again this is from her Integrative specialist.  Overall, I would say that her performance status is probably ECOG 1.    Medications:  Current Outpatient Medications:    ARMOUR THYROID 90 MG tablet, Take 90 mg by mouth every morning., Disp: , Rfl:    aspirin EC (ASPIR-LOW) 81 MG tablet, Take 81 mg by mouth daily., Disp: , Rfl:    atorvastatin (LIPITOR) 40 MG tablet, 20 mg daily., Disp: , Rfl:    cimetidine (TAGAMET) 400 MG tablet, Take 400 mg by mouth 2 (two) times daily., Disp: , Rfl:    dexamethasone (DECADRON) 4 MG tablet, Take 2 tablets (8 mg) by mouth daily x 3 days starting the day after cisplatin  chemotherapy. Take with food., Disp: 30 tablet, Rfl: 1   dipyridamole (PERSANTINE) 75 MG tablet, Take 75 mg by mouth 2 (two) times daily., Disp: , Rfl:    doxycycline (MONODOX) 100 MG capsule, Take 100 mg by mouth 2 (two) times daily., Disp: , Rfl:    doxycycline (VIBRAMYCIN) 100 MG capsule, Take 100 mg by mouth daily. 08/31/2022 Takes for 30 days/off 30 days., Disp: , Rfl:    fondaparinux (ARIXTRA) 7.5 MG/0.6ML SOLN injection, Inject 0.6 mLs (7.5 mg total) into the skin daily., Disp: 18 mL, Rfl: 4   lidocaine-prilocaine (EMLA) cream, APPLY TOPICALLY TO THE AFFECTED AREA 1 TIME, Disp: 30 g, Rfl: 4   Naltrexone HCl, Pain, (NALTREX) 4.5 MG CAPS, Take by mouth at bedtime., Disp: , Rfl:    OLANZapine (ZYPREXA) 10 MG tablet, Take 1 tablet (10 mg total) by mouth at bedtime., Disp: 30 tablet, Rfl: 6   Probiotic Product (PROBIOTIC BLEND PO), Take by mouth daily., Disp: , Rfl:    prochlorperazine (COMPAZINE) 10 MG tablet, TAKE 1 TABLET(10 MG) BY MOUTH EVERY 6 HOURS AS NEEDED FOR NAUSEA OR VOMITING, Disp: 30 tablet, Rfl: 1   lovastatin (MEVACOR) 20 MG tablet, Take 20 mg by mouth at bedtime. (Patient not taking: Reported on 10/13/2022), Disp: , Rfl:  metFORMIN (GLUCOPHAGE) 500 MG tablet, Take 500 mg by mouth 2 (two) times daily. (Patient not taking: Reported on 10/13/2022), Disp: , Rfl:    ondansetron (ZOFRAN) 8 MG tablet, Take 1 tablet (8 mg total) by mouth every 8 (eight) hours as needed for nausea or vomiting. Start on the third day after cisplatin., Disp: 30 tablet, Rfl: 1  Allergies:  Allergies  Allergen Reactions   Bee Venom Anaphylaxis, Swelling and Other (See Comments)   Aspartame Diarrhea   Aspartame And Phenylalanine Diarrhea   Adhesive [Tape] Other (See Comments)    Irritation and red    Past Medical History, Surgical history, Social history, and Family History were reviewed and updated.  Review of Systems: Review of Systems  Constitutional: Negative.   HENT: Negative.    Eyes: Negative.    Respiratory: Negative.    Cardiovascular: Negative.   Gastrointestinal:  Positive for abdominal pain.  Genitourinary: Negative.   Musculoskeletal: Negative.   Skin: Negative.   Neurological: Negative.   Endo/Heme/Allergies: Negative.   Psychiatric/Behavioral: Negative.      Physical Exam: Her vital signs show temperature of 97.5.  Pulse 60.  Blood pressure 103/53.  Weight is 123 pounds.   Wt Readings from Last 3 Encounters:  11/03/22 124 lb (56.2 kg)  10/13/22 123 lb 1.3 oz (55.8 kg)  09/29/22 122 lb (55.3 kg)    Physical Exam Vitals reviewed.  HENT:     Head: Normocephalic and atraumatic.  Eyes:     Pupils: Pupils are equal, round, and reactive to light.  Cardiovascular:     Rate and Rhythm: Normal rate and regular rhythm.     Heart sounds: Normal heart sounds.  Pulmonary:     Effort: Pulmonary effort is normal.     Breath sounds: Normal breath sounds.  Abdominal:     General: Bowel sounds are normal.     Palpations: Abdomen is soft.     Comments: Abdominal exam shows a soft abdomen.  She has well healing laparoscopic scars.  I think there are 5 laparoscopic scars.  She has no erythema or exudate or warmth associated with these.  She has no fluid wave in the abdomen.  There is no guarding or rebound tenderness.  She has no palpable liver or spleen tip.  Musculoskeletal:        General: No tenderness or deformity. Normal range of motion.     Cervical back: Normal range of motion.  Lymphadenopathy:     Cervical: No cervical adenopathy.  Skin:    General: Skin is warm and dry.     Findings: No erythema or rash.  Neurological:     Mental Status: She is alert and oriented to person, place, and time.  Psychiatric:        Behavior: Behavior normal.        Thought Content: Thought content normal.        Judgment: Judgment normal.      Lab Results  Component Value Date   WBC 8.0 11/03/2022   HGB 9.0 (L) 11/03/2022   HCT 27.7 (L) 11/03/2022   MCV 95.5 11/03/2022    PLT 257 11/03/2022     Chemistry      Component Value Date/Time   NA 135 11/03/2022 0811   NA 142 02/15/2017 1518   NA 140 04/13/2016 1055   K 3.8 11/03/2022 0811   K 4.1 02/15/2017 1518   K 4.1 04/13/2016 1055   CL 101 11/03/2022 0811   CL 107 02/15/2017 1518  CO2 25 11/03/2022 0811   CO2 29 02/15/2017 1518   CO2 27 04/13/2016 1055   BUN 16 11/03/2022 0811   BUN 17 02/15/2017 1518   BUN 22.1 04/13/2016 1055   CREATININE 0.72 11/03/2022 0811   CREATININE 1.0 02/15/2017 1518   CREATININE 0.8 04/13/2016 1055      Component Value Date/Time   CALCIUM 9.0 11/03/2022 0811   CALCIUM 9.3 02/15/2017 1518   CALCIUM 9.9 04/13/2016 1055   ALKPHOS 63 11/03/2022 0811   ALKPHOS 79 02/15/2017 1518   ALKPHOS 81 04/13/2016 1055   AST 17 11/03/2022 0811   AST 20 04/13/2016 1055   ALT 19 11/03/2022 0811   ALT 24 02/15/2017 1518   ALT 22 04/13/2016 1055   BILITOT 0.3 11/03/2022 0811   BILITOT 0.44 04/13/2016 1055      Impression and Plan: Ms. Stenerson is a 62 year old Caucasian female.  She has another recurrence of her gynecologic malignancy.  We now have her on therapy.  She is on chemotherapy with immunotherapy.  Again she wants to also incorporate complementary agents.    I would like to hope that the CA -125 will continue to come down nicely.  Because she is due for a PET scan in about a week or so.  I would like to believe that this will show a very good response.  She does get G-CSF.  We really have to get this to her so she can maintain her blood counts.  Hopefully, the olanzapine will help with her nausea.  Again, the PET scan will be critical.  We will plan to get her back to see Sherry Williams in about 3 weeks again.  I am just glad that her quality of life seems to be doing pretty well right now.  She has such good support system.  I think is really great idea that the Integrative specialist is involved and helping Sherry Williams out.    Josph Macho, MD 5/24/20249:37 AM

## 2022-11-03 NOTE — Patient Instructions (Signed)

## 2022-11-04 ENCOUNTER — Other Ambulatory Visit: Payer: Self-pay | Admitting: Hematology & Oncology

## 2022-11-04 ENCOUNTER — Other Ambulatory Visit: Payer: Self-pay

## 2022-11-04 DIAGNOSIS — C541 Malignant neoplasm of endometrium: Secondary | ICD-10-CM

## 2022-11-04 LAB — CA 125: Cancer Antigen (CA) 125: 44.3 U/mL — ABNORMAL HIGH (ref 0.0–38.1)

## 2022-11-07 ENCOUNTER — Inpatient Hospital Stay: Payer: Medicare Other

## 2022-11-07 ENCOUNTER — Encounter: Payer: Self-pay | Admitting: *Deleted

## 2022-11-07 VITALS — BP 122/60 | HR 72 | Temp 97.9°F | Resp 20

## 2022-11-07 DIAGNOSIS — Z79899 Other long term (current) drug therapy: Secondary | ICD-10-CM | POA: Diagnosis not present

## 2022-11-07 DIAGNOSIS — C541 Malignant neoplasm of endometrium: Secondary | ICD-10-CM

## 2022-11-07 DIAGNOSIS — Z5111 Encounter for antineoplastic chemotherapy: Secondary | ICD-10-CM | POA: Diagnosis not present

## 2022-11-07 DIAGNOSIS — C778 Secondary and unspecified malignant neoplasm of lymph nodes of multiple regions: Secondary | ICD-10-CM | POA: Diagnosis not present

## 2022-11-07 DIAGNOSIS — Z5189 Encounter for other specified aftercare: Secondary | ICD-10-CM | POA: Diagnosis not present

## 2022-11-07 MED ORDER — FILGRASTIM-SNDZ 480 MCG/0.8ML IJ SOSY
480.0000 ug | PREFILLED_SYRINGE | Freq: Once | INTRAMUSCULAR | Status: AC
  Administered 2022-11-07: 480 ug via SUBCUTANEOUS
  Filled 2022-11-07: qty 0.8

## 2022-11-07 NOTE — Patient Instructions (Signed)
Filgrastim Injection What is this medication? FILGRASTIM (fil GRA stim) lowers the risk of infection in people who are receiving chemotherapy. It works by helping your body make more white blood cells, which protects your body from infection. It may also be used to help people who have been exposed to high doses of radiation. It can be used to help prepare your body before a stem cell transplant. It works by helping your bone marrow make and release stem cells into the blood. This medicine may be used for other purposes; ask your health care provider or pharmacist if you have questions. COMMON BRAND NAME(S): Neupogen, Nivestym, Releuko, Zarxio What should I tell my care team before I take this medication? They need to know if you have any of these conditions: History of blood diseases, such as sickle cell anemia Kidney disease Recent or ongoing radiation An unusual or allergic reaction to filgrastim, pegfilgrastim, latex, rubber, other medications, foods, dyes, or preservatives Pregnant or trying to get pregnant Breast-feeding How should I use this medication? This medication is injected under the skin or into a vein. It is usually given by your care team in a hospital or clinic setting. It may be given at home. If you get this medication at home, you will be taught how to prepare and give it. Use exactly as directed. Take it as directed on the prescription label at the same time every day. Keep taking it unless your care team tells you to stop. It is important that you put your used needles and syringes in a special sharps container. Do not put them in a trash can. If you do not have a sharps container, call your pharmacist or care team to get one. This medication comes with INSTRUCTIONS FOR USE. Ask your pharmacist for directions on how to use this medication. Read the information carefully. Talk to your pharmacist or care team if you have questions. Talk to your care team about the use of this  medication in children. While it may be prescribed for children for selected conditions, precautions do apply. Overdosage: If you think you have taken too much of this medicine contact a poison control center or emergency room at once. NOTE: This medicine is only for you. Do not share this medicine with others. What if I miss a dose? It is important not to miss any doses. Talk to your care team about what to do if you miss a dose. What may interact with this medication? Medications that may cause a release of neutrophils, such as lithium This list may not describe all possible interactions. Give your health care provider a list of all the medicines, herbs, non-prescription drugs, or dietary supplements you use. Also tell them if you smoke, drink alcohol, or use illegal drugs. Some items may interact with your medicine. What should I watch for while using this medication? Your condition will be monitored carefully while you are receiving this medication. You may need bloodwork while taking this medication. Talk to your care team about your risk of cancer. You may be more at risk for certain types of cancer if you take this medication. What side effects may I notice from receiving this medication? Side effects that you should report to your care team as soon as possible: Allergic reactions--skin rash, itching, hives, swelling of the face, lips, tongue, or throat Capillary leak syndrome--stomach or muscle pain, unusual weakness or fatigue, feeling faint or lightheaded, decrease in the amount of urine, swelling of the ankles, hands, or   feet, trouble breathing High white blood cell level--fever, fatigue, trouble breathing, night sweats, change in vision, weight loss Inflammation of the aorta--fever, fatigue, back, chest, or stomach pain, severe headache Kidney injury (glomerulonephritis)--decrease in the amount of urine, red or dark brown urine, foamy or bubbly urine, swelling of the ankles, hands, or  feet Shortness of breath or trouble breathing Spleen injury--pain in upper left stomach or shoulder Unusual bruising or bleeding Side effects that usually do not require medical attention (report to your care team if they continue or are bothersome): Back pain Bone pain Fatigue Fever Headache Nausea This list may not describe all possible side effects. Call your doctor for medical advice about side effects. You may report side effects to FDA at 1-800-FDA-1088. Where should I keep my medication? Keep out of the reach of children and pets. Keep this medication in the original packaging until you are ready to take it. Protect from light. See product for storage information. Each product may have different instructions. Get rid of any unused medication after the expiration date. To get rid of medications that are no longer needed or have expired: Take the medication to a medications take-back program. Check with your pharmacy or law enforcement to find a location. If you cannot return the medication, ask your pharmacist or care team how to get rid of this medication safely. NOTE: This sheet is a summary. It may not cover all possible information. If you have questions about this medicine, talk to your doctor, pharmacist, or health care provider.  2024 Elsevier/Gold Standard (2021-10-20 00:00:00)  

## 2022-11-09 ENCOUNTER — Ambulatory Visit (HOSPITAL_COMMUNITY)
Admission: RE | Admit: 2022-11-09 | Discharge: 2022-11-09 | Disposition: A | Discharge status: Home / Self Care | Payer: Medicare Other | Source: Ambulatory Visit | Attending: Hematology & Oncology | Admitting: Hematology & Oncology

## 2022-11-09 DIAGNOSIS — C541 Malignant neoplasm of endometrium: Secondary | ICD-10-CM | POA: Diagnosis present

## 2022-11-09 LAB — GLUCOSE, CAPILLARY: Glucose-Capillary: 88 mg/dL (ref 70–99)

## 2022-11-09 MED ORDER — FLUDEOXYGLUCOSE F - 18 (FDG) INJECTION
6.2000 | Freq: Once | INTRAVENOUS | Status: AC
  Administered 2022-11-09: 5.9 via INTRAVENOUS

## 2022-11-10 ENCOUNTER — Inpatient Hospital Stay: Payer: Medicare Other

## 2022-11-10 ENCOUNTER — Inpatient Hospital Stay: Payer: Medicare Other | Admitting: Hematology & Oncology

## 2022-11-10 DIAGNOSIS — Z5111 Encounter for antineoplastic chemotherapy: Secondary | ICD-10-CM | POA: Diagnosis not present

## 2022-11-10 DIAGNOSIS — Z79899 Other long term (current) drug therapy: Secondary | ICD-10-CM | POA: Diagnosis not present

## 2022-11-10 DIAGNOSIS — Z5189 Encounter for other specified aftercare: Secondary | ICD-10-CM | POA: Diagnosis not present

## 2022-11-10 DIAGNOSIS — C541 Malignant neoplasm of endometrium: Secondary | ICD-10-CM

## 2022-11-10 DIAGNOSIS — C778 Secondary and unspecified malignant neoplasm of lymph nodes of multiple regions: Secondary | ICD-10-CM | POA: Diagnosis not present

## 2022-11-10 LAB — CBC WITH DIFFERENTIAL (CANCER CENTER ONLY)
Abs Immature Granulocytes: 0.16 10*3/uL — ABNORMAL HIGH (ref 0.00–0.07)
Basophils Absolute: 0.1 10*3/uL (ref 0.0–0.1)
Basophils Relative: 1 %
Eosinophils Absolute: 0 10*3/uL (ref 0.0–0.5)
Eosinophils Relative: 0 %
HCT: 27 % — ABNORMAL LOW (ref 36.0–46.0)
Hemoglobin: 8.8 g/dL — ABNORMAL LOW (ref 12.0–15.0)
Immature Granulocytes: 1 %
Lymphocytes Relative: 12 %
Lymphs Abs: 1.4 10*3/uL (ref 0.7–4.0)
MCH: 31.1 pg (ref 26.0–34.0)
MCHC: 32.6 g/dL (ref 30.0–36.0)
MCV: 95.4 fL (ref 80.0–100.0)
Monocytes Absolute: 0.6 10*3/uL (ref 0.1–1.0)
Monocytes Relative: 6 %
Neutro Abs: 8.9 10*3/uL — ABNORMAL HIGH (ref 1.7–7.7)
Neutrophils Relative %: 80 %
Platelet Count: 458 10*3/uL — ABNORMAL HIGH (ref 150–400)
RBC: 2.83 MIL/uL — ABNORMAL LOW (ref 3.87–5.11)
RDW: 15.9 % — ABNORMAL HIGH (ref 11.5–15.5)
WBC Count: 11.1 10*3/uL — ABNORMAL HIGH (ref 4.0–10.5)
nRBC: 0 % (ref 0.0–0.2)

## 2022-11-10 LAB — CMP (CANCER CENTER ONLY)
ALT: 24 U/L (ref 0–44)
AST: 17 U/L (ref 15–41)
Albumin: 4.4 g/dL (ref 3.5–5.0)
Alkaline Phosphatase: 65 U/L (ref 38–126)
Anion gap: 6 (ref 5–15)
BUN: 15 mg/dL (ref 8–23)
CO2: 25 mmol/L (ref 22–32)
Calcium: 9.2 mg/dL (ref 8.9–10.3)
Chloride: 104 mmol/L (ref 98–111)
Creatinine: 0.76 mg/dL (ref 0.44–1.00)
GFR, Estimated: >60 mL/min (ref >60)
Glucose, Bld: 114 mg/dL — ABNORMAL HIGH (ref 70–99)
Potassium: 4 mmol/L (ref 3.5–5.1)
Sodium: 135 mmol/L (ref 135–145)
Total Bilirubin: 0.3 mg/dL (ref 0.3–1.2)
Total Protein: 6.3 g/dL — ABNORMAL LOW (ref 6.5–8.1)

## 2022-11-10 LAB — LACTATE DEHYDROGENASE: LDH: 179 U/L (ref 98–192)

## 2022-11-10 LAB — MAGNESIUM: Magnesium: 2.1 mg/dL (ref 1.7–2.4)

## 2022-11-10 MED ORDER — PEGFILGRASTIM 6 MG/0.6ML ~~LOC~~ PSKT
6.0000 mg | PREFILLED_SYRINGE | Freq: Once | SUBCUTANEOUS | Status: AC
  Administered 2022-11-10: 6 mg via SUBCUTANEOUS

## 2022-11-10 MED ORDER — SODIUM CHLORIDE 0.9 % IV SOLN
1200.0000 mg | Freq: Once | INTRAVENOUS | Status: AC
  Administered 2022-11-10: 1200 mg via INTRAVENOUS
  Filled 2022-11-10: qty 26.36

## 2022-11-10 MED ORDER — SODIUM CHLORIDE 0.9 % IV SOLN: Freq: Once | INTRAVENOUS | Status: AC

## 2022-11-10 MED ORDER — HEPARIN SOD (PORK) LOCK FLUSH 100 UNIT/ML IV SOLN
500.0000 [IU] | Freq: Once | INTRAVENOUS | Status: AC | PRN
  Administered 2022-11-10: 500 [IU]

## 2022-11-10 MED ORDER — SODIUM CHLORIDE 0.9 % IV SOLN
400.0000 mg | Freq: Once | INTRAVENOUS | Status: AC
  Administered 2022-11-10: 400 mg via INTRAVENOUS
  Filled 2022-11-10: qty 16

## 2022-11-10 MED ORDER — PROCHLORPERAZINE MALEATE 10 MG PO TABS: 10.0000 mg | ORAL_TABLET | Freq: Once | ORAL | Status: DC

## 2022-11-10 MED ORDER — SODIUM CHLORIDE 0.9% FLUSH
10.0000 mL | INTRAVENOUS | Status: DC | PRN
  Administered 2022-11-10: 10 mL

## 2022-11-10 NOTE — Patient Instructions (Signed)

## 2022-11-12 ENCOUNTER — Encounter: Payer: Self-pay | Admitting: *Deleted

## 2022-11-13 ENCOUNTER — Inpatient Hospital Stay: Payer: Medicare Other

## 2022-11-14 ENCOUNTER — Other Ambulatory Visit: Payer: Self-pay

## 2022-11-14 LAB — CA 125: Cancer Antigen (CA) 125: 28.6 U/mL (ref 0.0–38.1)

## 2022-11-16 ENCOUNTER — Other Ambulatory Visit: Payer: Self-pay | Admitting: Hematology & Oncology

## 2022-11-16 ENCOUNTER — Other Ambulatory Visit: Payer: Self-pay

## 2022-11-16 DIAGNOSIS — C541 Malignant neoplasm of endometrium: Secondary | ICD-10-CM

## 2022-11-23 ENCOUNTER — Other Ambulatory Visit: Payer: Self-pay

## 2022-11-23 DIAGNOSIS — C541 Malignant neoplasm of endometrium: Secondary | ICD-10-CM

## 2022-11-24 ENCOUNTER — Inpatient Hospital Stay: Payer: Medicare Other | Attending: Hematology & Oncology

## 2022-11-24 ENCOUNTER — Other Ambulatory Visit: Payer: Self-pay

## 2022-11-24 ENCOUNTER — Encounter: Payer: Self-pay | Admitting: Hematology & Oncology

## 2022-11-24 ENCOUNTER — Other Ambulatory Visit: Payer: Self-pay | Admitting: *Deleted

## 2022-11-24 ENCOUNTER — Inpatient Hospital Stay (HOSPITAL_BASED_OUTPATIENT_CLINIC_OR_DEPARTMENT_OTHER): Payer: Medicare Other | Admitting: Hematology & Oncology

## 2022-11-24 ENCOUNTER — Inpatient Hospital Stay: Payer: Medicare Other

## 2022-11-24 VITALS — BP 94/49 | HR 63 | Temp 97.9°F | Resp 20 | Ht 66.0 in | Wt 122.1 lb

## 2022-11-24 VITALS — BP 103/43 | HR 72

## 2022-11-24 DIAGNOSIS — Z79899 Other long term (current) drug therapy: Secondary | ICD-10-CM | POA: Insufficient documentation

## 2022-11-24 DIAGNOSIS — C541 Malignant neoplasm of endometrium: Secondary | ICD-10-CM | POA: Diagnosis present

## 2022-11-24 DIAGNOSIS — D649 Anemia, unspecified: Secondary | ICD-10-CM | POA: Insufficient documentation

## 2022-11-24 DIAGNOSIS — Z5111 Encounter for antineoplastic chemotherapy: Secondary | ICD-10-CM | POA: Diagnosis not present

## 2022-11-24 DIAGNOSIS — Z95828 Presence of other vascular implants and grafts: Secondary | ICD-10-CM

## 2022-11-24 LAB — CBC WITH DIFFERENTIAL (CANCER CENTER ONLY)
Abs Immature Granulocytes: 0.19 10*3/uL — ABNORMAL HIGH (ref 0.00–0.07)
Basophils Absolute: 0 10*3/uL (ref 0.0–0.1)
Basophils Relative: 0 %
Eosinophils Absolute: 0.1 10*3/uL (ref 0.0–0.5)
Eosinophils Relative: 2 %
HCT: 26.4 % — ABNORMAL LOW (ref 36.0–46.0)
Hemoglobin: 8.3 g/dL — ABNORMAL LOW (ref 12.0–15.0)
Immature Granulocytes: 2 %
Lymphocytes Relative: 12 %
Lymphs Abs: 1.2 10*3/uL (ref 0.7–4.0)
MCH: 32 pg (ref 26.0–34.0)
MCHC: 31.4 g/dL (ref 30.0–36.0)
MCV: 101.9 fL — ABNORMAL HIGH (ref 80.0–100.0)
Monocytes Absolute: 0.7 10*3/uL (ref 0.1–1.0)
Monocytes Relative: 7 %
Neutro Abs: 7.3 10*3/uL (ref 1.7–7.7)
Neutrophils Relative %: 77 %
Platelet Count: 279 10*3/uL (ref 150–400)
RBC: 2.59 MIL/uL — ABNORMAL LOW (ref 3.87–5.11)
RDW: 19.9 % — ABNORMAL HIGH (ref 11.5–15.5)
WBC Count: 9.5 10*3/uL (ref 4.0–10.5)
nRBC: 0 % (ref 0.0–0.2)

## 2022-11-24 LAB — CMP (CANCER CENTER ONLY)
ALT: 14 U/L (ref 0–44)
AST: 13 U/L — ABNORMAL LOW (ref 15–41)
Albumin: 4.1 g/dL (ref 3.5–5.0)
Alkaline Phosphatase: 85 U/L (ref 38–126)
Anion gap: 6 (ref 5–15)
BUN: 18 mg/dL (ref 8–23)
CO2: 28 mmol/L (ref 22–32)
Calcium: 9.3 mg/dL (ref 8.9–10.3)
Chloride: 107 mmol/L (ref 98–111)
Creatinine: 0.8 mg/dL (ref 0.44–1.00)
GFR, Estimated: >60 mL/min (ref >60)
Glucose, Bld: 119 mg/dL — ABNORMAL HIGH (ref 70–99)
Potassium: 4 mmol/L (ref 3.5–5.1)
Sodium: 141 mmol/L (ref 135–145)
Total Bilirubin: 0.3 mg/dL (ref 0.3–1.2)
Total Protein: 6 g/dL — ABNORMAL LOW (ref 6.5–8.1)

## 2022-11-24 LAB — MAGNESIUM: Magnesium: 2 mg/dL (ref 1.7–2.4)

## 2022-11-24 LAB — LACTATE DEHYDROGENASE: LDH: 215 U/L — ABNORMAL HIGH (ref 98–192)

## 2022-11-24 MED ORDER — SODIUM CHLORIDE 0.9 % IV SOLN
656.1000 mg/m2 | Freq: Once | INTRAVENOUS | Status: AC
  Administered 2022-11-24: 1102 mg via INTRAVENOUS
  Filled 2022-11-24: qty 25.98

## 2022-11-24 MED ORDER — PALONOSETRON HCL INJECTION 0.25 MG/5ML
0.2500 mg | Freq: Once | INTRAVENOUS | Status: AC
  Administered 2022-11-24: 0.25 mg via INTRAVENOUS
  Filled 2022-11-24: qty 5

## 2022-11-24 MED ORDER — SODIUM CHLORIDE 0.9 % IV SOLN
150.0000 mg | Freq: Once | INTRAVENOUS | Status: AC
  Administered 2022-11-24: 150 mg via INTRAVENOUS
  Filled 2022-11-24: qty 150

## 2022-11-24 MED ORDER — SODIUM CHLORIDE 0.9% FLUSH
10.0000 mL | INTRAVENOUS | Status: DC | PRN
  Administered 2022-11-24: 10 mL

## 2022-11-24 MED ORDER — SODIUM CHLORIDE 0.9 % IV SOLN
10.0000 mg | Freq: Once | INTRAVENOUS | Status: AC
  Administered 2022-11-24: 10 mg via INTRAVENOUS
  Filled 2022-11-24: qty 10

## 2022-11-24 MED ORDER — SODIUM CHLORIDE 0.9 % IV SOLN
51.0300 mg/m2 | Freq: Once | INTRAVENOUS | Status: AC
  Administered 2022-11-24: 85 mg via INTRAVENOUS
  Filled 2022-11-24: qty 85

## 2022-11-24 MED ORDER — SODIUM CHLORIDE 0.9 % IV SOLN: Freq: Once | INTRAVENOUS | Status: AC

## 2022-11-24 MED ORDER — POTASSIUM CHLORIDE IN NACL 20-0.9 MEQ/L-% IV SOLN
Freq: Once | INTRAVENOUS | Status: AC
  Filled 2022-11-24: qty 1000

## 2022-11-24 MED ORDER — MAGNESIUM SULFATE 2 GM/50ML IV SOLN
2.0000 g | Freq: Once | INTRAVENOUS | Status: AC
  Administered 2022-11-24: 2 g via INTRAVENOUS
  Filled 2022-11-24: qty 50

## 2022-11-24 MED ORDER — HEPARIN SOD (PORK) LOCK FLUSH 100 UNIT/ML IV SOLN
500.0000 [IU] | Freq: Once | INTRAVENOUS | Status: AC | PRN
  Administered 2022-11-24: 500 [IU]

## 2022-11-24 NOTE — Progress Notes (Signed)
Hematology and Oncology Follow Up Visit  Sherry Williams [id number] November 15, 1960 62 y.o. 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 Erbacher is back for follow-up.  As expected, she has had a very nice response to treatment.  We did do a PET scan on her.  The PET scan basically showed that she is in remission.  There is no active disease.  Her CA-125 is now down to 29.  When we first started, the CA-125 was 2400.  She I think is tolerated treatment pretty well.  She is having some nausea.  She feels little bit more tired today.  She has had no problems with bowels or bladder.  There is no bleeding.  She does have some bruising.  She is on Arixtra.  She also is taking Persantine from her Engineer, civil (consulting).  She has had no cough or shortness of breath.  She has had no leg swelling.  There may be little bit of swelling about the ankles on occasion.  She was wondering how many more treatments she has to take.  I told her that I probably would utilize another cycle after this and then we can see about maintenance with immunotherapy.  At the present time, I would say that her performance status is probably ECOG 1.    Medications:  Current Outpatient Medications:    ARMOUR THYROID 90 MG tablet, Take 90 mg by mouth every morning., Disp: , Rfl:    aspirin EC (ASPIR-LOW) 81 MG tablet, Take 81 mg by mouth daily., Disp: , Rfl:    atorvastatin (LIPITOR) 40 MG tablet, 20 mg daily., Disp: , Rfl:    cimetidine (TAGAMET) 400 MG tablet, Take 400 mg by mouth 2 (two) times daily., Disp: , Rfl:    dexamethasone (DECADRON) 4 MG tablet, Take 2 tablets (8 mg) by mouth  daily x 3 days starting the day after cisplatin chemotherapy. Take with food., Disp: 30 tablet, Rfl: 1   dipyridamole (PERSANTINE) 75 MG tablet, Take 75 mg by mouth 2 (two) times daily., Disp: , Rfl:    doxycycline (MONODOX) 100 MG capsule, Take 100 mg by mouth 2 (two) times daily., Disp: , Rfl:    doxycycline (VIBRAMYCIN) 100 MG capsule, Take 100 mg by mouth daily. 08/31/2022 Takes for 30 days/off 30 days., Disp: , Rfl:    fondaparinux (ARIXTRA) 7.5 MG/0.6ML SOLN injection, Inject 0.6 mLs (7.5 mg total) into the skin daily., Disp: 18 mL, Rfl: 4   lidocaine-prilocaine (EMLA) cream, APPLY TOPICALLY TO THE AFFECTED AREA 1 TIME, Disp: 30 g, Rfl: 4   lovastatin (MEVACOR) 20 MG tablet, Take 20 mg by mouth at bedtime. (Patient not taking: Reported on 10/13/2022), Disp: , Rfl:    metFORMIN (GLUCOPHAGE) 500 MG tablet, Take 500 mg by mouth 2 (two) times daily. (Patient not taking: Reported on 10/13/2022), Disp: , Rfl:    Naltrexone HCl, Pain, (NALTREX) 4.5 MG CAPS, Take by mouth at bedtime., Disp: , Rfl:    OLANZapine (ZYPREXA) 10 MG tablet, Take 1 tablet (10 mg total) by mouth at bedtime., Disp: 30 tablet, Rfl: 6   ondansetron (ZOFRAN) 8 MG tablet, Take 1 tablet (  8 mg total) by mouth every 8 (eight) hours as needed for nausea or vomiting. Start on the third day after cisplatin., Disp: 60 tablet, Rfl: 4   Probiotic Product (PROBIOTIC BLEND PO), Take by mouth daily., Disp: , Rfl:    prochlorperazine (COMPAZINE) 10 MG tablet, TAKE 1 TABLET(10 MG) BY MOUTH EVERY 6 HOURS AS NEEDED FOR NAUSEA OR VOMITING, Disp: 30 tablet, Rfl: 1  Allergies:  Allergies  Allergen Reactions   Bee Venom Anaphylaxis, Swelling and Other (See Comments)   Aspartame Diarrhea   Aspartame And Phenylalanine Diarrhea   Adhesive [Tape] Other (See Comments)    Irritation and red    Past Medical History, Surgical history, Social history, and Family History were reviewed and updated.  Review of Systems: Review of Systems  Constitutional:  Negative.   HENT: Negative.    Eyes: Negative.   Respiratory: Negative.    Cardiovascular: Negative.   Gastrointestinal:  Positive for abdominal pain.  Genitourinary: Negative.   Musculoskeletal: Negative.   Skin: Negative.   Neurological: Negative.   Endo/Heme/Allergies: Negative.   Psychiatric/Behavioral: Negative.      Physical Exam: Her vital signs show temperature of 97.5.  Pulse 63.  Blood pressure is 94/49.  Weight is 122 pounds.   Wt Readings from Last 3 Encounters:  11/10/22 122 lb (55.3 kg)  11/09/22 119 lb (54 kg)  11/03/22 124 lb (56.2 kg)    Physical Exam Vitals reviewed.  HENT:     Head: Normocephalic and atraumatic.  Eyes:     Pupils: Pupils are equal, round, and reactive to light.  Cardiovascular:     Rate and Rhythm: Normal rate and regular rhythm.     Heart sounds: Normal heart sounds.  Pulmonary:     Effort: Pulmonary effort is normal.     Breath sounds: Normal breath sounds.  Abdominal:     General: Bowel sounds are normal.     Palpations: Abdomen is soft.     Comments: Abdominal exam shows a soft abdomen.  She has well healing laparoscopic scars.  I think there are 5 laparoscopic scars.  She has no erythema or exudate or warmth associated with these.  She has no fluid wave in the abdomen.  There is no guarding or rebound tenderness.  She has no palpable liver or spleen tip.  Musculoskeletal:        General: No tenderness or deformity. Normal range of motion.     Cervical back: Normal range of motion.  Lymphadenopathy:     Cervical: No cervical adenopathy.  Skin:    General: Skin is warm and dry.     Findings: No erythema or rash.  Neurological:     Mental Status: She is alert and oriented to person, place, and time.  Psychiatric:        Behavior: Behavior normal.        Thought Content: Thought content normal.        Judgment: Judgment normal.     Lab Results  Component Value Date   WBC 11.1 (H) 11/10/2022   HGB 8.8 (L) 11/10/2022   HCT  27.0 (L) 11/10/2022   MCV 95.4 11/10/2022   PLT 458 (H) 11/10/2022     Chemistry      Component Value Date/Time   NA 135 11/10/2022 0810   NA 142 02/15/2017 1518   NA 140 04/13/2016 1055   K 4.0 11/10/2022 0810   K 4.1 02/15/2017 1518   K 4.1 04/13/2016 1055   CL 104  11/10/2022 0810   CL 107 02/15/2017 1518   CO2 25 11/10/2022 0810   CO2 29 02/15/2017 1518   CO2 27 04/13/2016 1055   BUN 15 11/10/2022 0810   BUN 17 02/15/2017 1518   BUN 22.1 04/13/2016 1055   CREATININE 0.76 11/10/2022 0810   CREATININE 1.0 02/15/2017 1518   CREATININE 0.8 04/13/2016 1055      Component Value Date/Time   CALCIUM 9.2 11/10/2022 0810   CALCIUM 9.3 02/15/2017 1518   CALCIUM 9.9 04/13/2016 1055   ALKPHOS 65 11/10/2022 0810   ALKPHOS 79 02/15/2017 1518   ALKPHOS 81 04/13/2016 1055   AST 17 11/10/2022 0810   AST 20 04/13/2016 1055   ALT 24 11/10/2022 0810   ALT 24 02/15/2017 1518   ALT 22 04/13/2016 1055   BILITOT 0.3 11/10/2022 0810   BILITOT 0.44 04/13/2016 1055      Impression and Plan: Ms. Aspinall is a 62 year old Caucasian female.  She has another recurrence of her gynecologic malignancy.  I am really not surprised that she has had a very nice response to chemoimmunotherapy.  It has been several years that she actually has had chemotherapy.  I do think that 1 more cycle after this should help keep her in remission.  We will have to see what the CA-125 is today.  Hide noted that her hemoglobin is quite low.  As such, we may have to think about getting her in for a transfusion she has more symptoms.  I know that she is very independent and she really would prefer not to be transfused if possible.  I am just glad that her quality of life is doing as well as it is doing.  We will plan to have her come back to see me in another 3 to 4 weeks for her fifth cycle of treatment.    Josph Macho, MD 6/14/20248:58 AM

## 2022-11-24 NOTE — Patient Instructions (Signed)
[facility]  Discharge Instructions: Thank you for choosing [facility] to provide your oncology and hematology care.   If you have a lab appointment with the Cancer Center, please go directly to the Cancer Center and check in at the registration area.  Wear comfortable clothing and clothing appropriate for easy access to any Portacath or PICC line.   We strive to give you quality time with your provider. You may need to reschedule your appointment if you arrive late (15 or more minutes).  Arriving late affects you and other patients whose appointments are after yours.  Also, if you miss three or more appointments without notifying the office, you may be dismissed from the clinic at the provider's discretion.      For prescription refill requests, have your pharmacy contact our office and allow 72 hours for refills to be completed.    Today you received the following chemotherapy and/or immunotherapy agents cisplatin, gemzar      To help prevent nausea and vomiting after your treatment, we encourage you to take your nausea medication as directed.  BELOW ARE SYMPTOMS THAT SHOULD BE REPORTED IMMEDIATELY: *FEVER GREATER THAN 100.4 F (38 C) OR HIGHER *CHILLS OR SWEATING *NAUSEA AND VOMITING THAT IS NOT CONTROLLED WITH YOUR NAUSEA MEDICATION *UNUSUAL SHORTNESS OF BREATH *UNUSUAL BRUISING OR BLEEDING *URINARY PROBLEMS (pain or burning when urinating, or frequent urination) *BOWEL PROBLEMS (unusual diarrhea, constipation, pain near the anus) TENDERNESS IN MOUTH AND THROAT WITH OR WITHOUT PRESENCE OF ULCERS (sore throat, sores in mouth, or a toothache) UNUSUAL RASH, SWELLING OR PAIN  UNUSUAL VAGINAL DISCHARGE OR ITCHING   Items with * indicate a potential emergency and should be followed up as soon as possible or go to the Emergency Department if any problems should occur.  Please show the CHEMOTHERAPY ALERT CARD or IMMUNOTHERAPY ALERT CARD at  check-in to the Emergency Department and triage nurse. Should you have questions after your visit or need to cancel or reschedule your appointment, please contact [facility]  [phone] and follow the prompts.  Office hours are 8:00 a.m. to 4:30 p.m. Monday - Friday. Please note that voicemails left after 4:00 p.m. may not be returned until the following business day.  We are closed weekends and major holidays. You have access to a nurse at all times for urgent questions. Please call the main number to the clinic [phone] and follow the prompts.  For any non-urgent questions, you may also contact your provider using MyChart. We now offer e-Visits for anyone 9 and older to request care online for non-urgent symptoms. For details visit [email or web address].   Also download the MyChart app! Go to the app store, search "MyChart", open the app, select [facility], and log in with your MyChart username and password.

## 2022-11-24 NOTE — Patient Instructions (Signed)

## 2022-11-24 NOTE — Progress Notes (Signed)
Day 3 filgrastim deleted per Dr. Gustavo Lah instructions.

## 2022-11-25 ENCOUNTER — Other Ambulatory Visit: Payer: Self-pay

## 2022-11-25 ENCOUNTER — Encounter: Payer: Self-pay | Admitting: Hematology & Oncology

## 2022-11-26 LAB — CA 125: Cancer Antigen (CA) 125: 16.1 U/mL (ref 0.0–38.1)

## 2022-11-27 ENCOUNTER — Ambulatory Visit: Payer: Medicare Other

## 2022-11-27 ENCOUNTER — Inpatient Hospital Stay: Payer: Medicare Other

## 2022-11-27 ENCOUNTER — Other Ambulatory Visit: Payer: Self-pay | Admitting: *Deleted

## 2022-11-27 DIAGNOSIS — Z5111 Encounter for antineoplastic chemotherapy: Secondary | ICD-10-CM | POA: Diagnosis not present

## 2022-11-27 DIAGNOSIS — D649 Anemia, unspecified: Secondary | ICD-10-CM | POA: Diagnosis not present

## 2022-11-27 DIAGNOSIS — Z7901 Long term (current) use of anticoagulants: Secondary | ICD-10-CM

## 2022-11-27 DIAGNOSIS — I63019 Cerebral infarction due to thrombosis of unspecified vertebral artery: Secondary | ICD-10-CM

## 2022-11-27 DIAGNOSIS — C541 Malignant neoplasm of endometrium: Secondary | ICD-10-CM

## 2022-11-27 DIAGNOSIS — Z95828 Presence of other vascular implants and grafts: Secondary | ICD-10-CM

## 2022-11-27 DIAGNOSIS — Z79899 Other long term (current) drug therapy: Secondary | ICD-10-CM | POA: Diagnosis not present

## 2022-11-27 LAB — CBC WITH DIFFERENTIAL (CANCER CENTER ONLY)
Abs Immature Granulocytes: 0.04 10*3/uL (ref 0.00–0.07)
Basophils Absolute: 0 10*3/uL (ref 0.0–0.1)
Basophils Relative: 0 %
Eosinophils Absolute: 0 10*3/uL (ref 0.0–0.5)
Eosinophils Relative: 0 %
HCT: 25.3 % — ABNORMAL LOW (ref 36.0–46.0)
Hemoglobin: 7.9 g/dL — ABNORMAL LOW (ref 12.0–15.0)
Immature Granulocytes: 1 %
Lymphocytes Relative: 8 %
Lymphs Abs: 0.6 10*3/uL — ABNORMAL LOW (ref 0.7–4.0)
MCH: 31.7 pg (ref 26.0–34.0)
MCHC: 31.2 g/dL (ref 30.0–36.0)
MCV: 101.6 fL — ABNORMAL HIGH (ref 80.0–100.0)
Monocytes Absolute: 0.1 10*3/uL (ref 0.1–1.0)
Monocytes Relative: 1 %
Neutro Abs: 6.8 10*3/uL (ref 1.7–7.7)
Neutrophils Relative %: 90 %
Platelet Count: 420 10*3/uL — ABNORMAL HIGH (ref 150–400)
RBC: 2.49 MIL/uL — ABNORMAL LOW (ref 3.87–5.11)
RDW: 19.9 % — ABNORMAL HIGH (ref 11.5–15.5)
Smear Review: NORMAL
WBC Count: 7.6 10*3/uL (ref 4.0–10.5)
nRBC: 0 % (ref 0.0–0.2)

## 2022-11-27 LAB — SAMPLE TO BLOOD BANK

## 2022-11-27 LAB — D-DIMER, QUANTITATIVE: D-Dimer, Quant: <0.27 ug/mL-FEU (ref 0.00–0.50)

## 2022-11-28 ENCOUNTER — Other Ambulatory Visit: Payer: Self-pay | Admitting: *Deleted

## 2022-11-28 ENCOUNTER — Other Ambulatory Visit: Payer: Self-pay

## 2022-11-28 ENCOUNTER — Encounter: Payer: Self-pay | Admitting: Pharmacist

## 2022-11-28 ENCOUNTER — Encounter: Payer: Self-pay | Admitting: Hematology & Oncology

## 2022-11-28 ENCOUNTER — Other Ambulatory Visit (HOSPITAL_BASED_OUTPATIENT_CLINIC_OR_DEPARTMENT_OTHER): Payer: Self-pay

## 2022-11-28 ENCOUNTER — Other Ambulatory Visit (HOSPITAL_COMMUNITY): Payer: Self-pay

## 2022-11-28 DIAGNOSIS — C541 Malignant neoplasm of endometrium: Secondary | ICD-10-CM

## 2022-11-28 DIAGNOSIS — I63019 Cerebral infarction due to thrombosis of unspecified vertebral artery: Secondary | ICD-10-CM

## 2022-11-28 DIAGNOSIS — I82401 Acute embolism and thrombosis of unspecified deep veins of right lower extremity: Secondary | ICD-10-CM

## 2022-11-28 LAB — TYPE AND SCREEN
ABO/RH(D): O POS
Antibody Screen: NEGATIVE

## 2022-11-28 NOTE — Addendum Note (Signed)
Addended by: Gwendel Hanson on: 11/28/2022 01:33 PM   Modules accepted: Orders

## 2022-11-29 ENCOUNTER — Other Ambulatory Visit (HOSPITAL_BASED_OUTPATIENT_CLINIC_OR_DEPARTMENT_OTHER): Payer: Self-pay

## 2022-11-29 ENCOUNTER — Encounter: Payer: Self-pay | Admitting: *Deleted

## 2022-11-29 NOTE — Progress Notes (Signed)
Please see MyChart Communication dated 11/25/2022.  Continued attempts to coordinate directed blood donation for this patient. She needs an transfusion but doesn't want a blood donor who has received the covid vaccine. She is working with [facility].   Prescription sent early this morning, however we received a [facility] prescription form which we also completed and faxed back to [facility].  Ideally patient will be able to coordinate with known donors and we can transfuse patient next week. She will need one unit.   Oncology Nurse Navigator Documentation     11/29/2022   10:00 AM  Oncology Nurse Navigator Flowsheets  Navigator Location [facility]  Navigator Encounter Type MyChart  Patient Visit Type MedOnc  Treatment Phase Active Tx  Interventions Coordination of Care  Acuity Level 2-Minimal Needs (1-2 Barriers Identified)  Coordination of Care Other  Time Spent with Patient 45

## 2022-11-30 ENCOUNTER — Other Ambulatory Visit: Payer: Self-pay

## 2022-11-30 DIAGNOSIS — C541 Malignant neoplasm of endometrium: Secondary | ICD-10-CM

## 2022-12-01 ENCOUNTER — Inpatient Hospital Stay: Payer: Medicare Other

## 2022-12-01 VITALS — BP 98/54 | HR 62 | Temp 97.9°F | Resp 18

## 2022-12-01 DIAGNOSIS — C541 Malignant neoplasm of endometrium: Secondary | ICD-10-CM

## 2022-12-01 DIAGNOSIS — D649 Anemia, unspecified: Secondary | ICD-10-CM | POA: Diagnosis not present

## 2022-12-01 DIAGNOSIS — Z5111 Encounter for antineoplastic chemotherapy: Secondary | ICD-10-CM | POA: Diagnosis not present

## 2022-12-01 DIAGNOSIS — Z79899 Other long term (current) drug therapy: Secondary | ICD-10-CM | POA: Diagnosis not present

## 2022-12-01 DIAGNOSIS — I82401 Acute embolism and thrombosis of unspecified deep veins of right lower extremity: Secondary | ICD-10-CM

## 2022-12-01 DIAGNOSIS — I63019 Cerebral infarction due to thrombosis of unspecified vertebral artery: Secondary | ICD-10-CM

## 2022-12-01 LAB — CBC WITH DIFFERENTIAL (CANCER CENTER ONLY)
Abs Immature Granulocytes: 0.04 10*3/uL (ref 0.00–0.07)
Basophils Absolute: 0 10*3/uL (ref 0.0–0.1)
Basophils Relative: 1 %
Eosinophils Absolute: 0.1 10*3/uL (ref 0.0–0.5)
Eosinophils Relative: 1 %
HCT: 25.2 % — ABNORMAL LOW (ref 36.0–46.0)
Hemoglobin: 8 g/dL — ABNORMAL LOW (ref 12.0–15.0)
Immature Granulocytes: 1 %
Lymphocytes Relative: 23 %
Lymphs Abs: 1 10*3/uL (ref 0.7–4.0)
MCH: 31.7 pg (ref 26.0–34.0)
MCHC: 31.7 g/dL (ref 30.0–36.0)
MCV: 100 fL (ref 80.0–100.0)
Monocytes Absolute: 0.4 10*3/uL (ref 0.1–1.0)
Monocytes Relative: 8 %
Neutro Abs: 2.9 10*3/uL (ref 1.7–7.7)
Neutrophils Relative %: 66 %
Platelet Count: 376 10*3/uL (ref 150–400)
RBC: 2.52 MIL/uL — ABNORMAL LOW (ref 3.87–5.11)
RDW: 18.2 % — ABNORMAL HIGH (ref 11.5–15.5)
WBC Count: 4.5 10*3/uL (ref 4.0–10.5)
nRBC: 0 % (ref 0.0–0.2)

## 2022-12-01 LAB — CMP (CANCER CENTER ONLY)
ALT: 23 U/L (ref 0–44)
AST: 17 U/L (ref 15–41)
Albumin: 4.2 g/dL (ref 3.5–5.0)
Alkaline Phosphatase: 68 U/L (ref 38–126)
Anion gap: 7 (ref 5–15)
BUN: 11 mg/dL (ref 8–23)
CO2: 29 mmol/L (ref 22–32)
Calcium: 9.2 mg/dL (ref 8.9–10.3)
Chloride: 102 mmol/L (ref 98–111)
Creatinine: 0.7 mg/dL (ref 0.44–1.00)
GFR, Estimated: >60 mL/min (ref >60)
Glucose, Bld: 99 mg/dL (ref 70–99)
Potassium: 4.1 mmol/L (ref 3.5–5.1)
Sodium: 138 mmol/L (ref 135–145)
Total Bilirubin: 0.3 mg/dL (ref 0.3–1.2)
Total Protein: 5.9 g/dL — ABNORMAL LOW (ref 6.5–8.1)

## 2022-12-01 LAB — SAMPLE TO BLOOD BANK

## 2022-12-01 LAB — VITAMIN B12: Vitamin B-12: 3771 pg/mL — ABNORMAL HIGH (ref 180–914)

## 2022-12-01 LAB — FERRITIN: Ferritin: 87 ng/mL (ref 11–307)

## 2022-12-01 MED ORDER — COLD PACK MISC ONCOLOGY: 1.0000 | Freq: Once | Status: DC | PRN

## 2022-12-01 MED ORDER — SODIUM CHLORIDE 0.9% FLUSH
10.0000 mL | INTRAVENOUS | Status: DC | PRN
  Administered 2022-12-01: 10 mL

## 2022-12-01 MED ORDER — PROCHLORPERAZINE MALEATE 10 MG PO TABS: 10.0000 mg | ORAL_TABLET | Freq: Once | ORAL | Status: DC

## 2022-12-01 MED ORDER — SODIUM CHLORIDE 0.9 % IV SOLN: Freq: Once | INTRAVENOUS | Status: AC

## 2022-12-01 MED ORDER — HEPARIN SOD (PORK) LOCK FLUSH 100 UNIT/ML IV SOLN
500.0000 [IU] | Freq: Once | INTRAVENOUS | Status: AC | PRN
  Administered 2022-12-01: 500 [IU]

## 2022-12-01 MED ORDER — PEGFILGRASTIM 6 MG/0.6ML ~~LOC~~ PSKT
6.0000 mg | PREFILLED_SYRINGE | Freq: Once | SUBCUTANEOUS | Status: AC
  Administered 2022-12-01: 6 mg via SUBCUTANEOUS
  Filled 2022-12-01: qty 0.6

## 2022-12-01 MED ORDER — SODIUM CHLORIDE 0.9 % IV SOLN
656.1000 mg/m2 | Freq: Once | INTRAVENOUS | Status: AC
  Administered 2022-12-01: 1102 mg via INTRAVENOUS
  Filled 2022-12-01: qty 25.98

## 2022-12-01 MED ORDER — SODIUM CHLORIDE 0.9 % IV SOLN: Freq: Once | INTRAVENOUS | Status: DC

## 2022-12-01 NOTE — Patient Instructions (Signed)
[facility]  Discharge Instructions: Thank you for choosing [facility] to provide your oncology and hematology care.   If you have a lab appointment with the Cancer Center, please go directly to the Cancer Center and check in at the registration area.  Wear comfortable clothing and clothing appropriate for easy access to any Portacath or PICC line.   We strive to give you quality time with your provider. You may need to reschedule your appointment if you arrive late (15 or more minutes).  Arriving late affects you and other patients whose appointments are after yours.  Also, if you miss three or more appointments without notifying the office, you may be dismissed from the clinic at the provider's discretion.      For prescription refill requests, have your pharmacy contact our office and allow 72 hours for refills to be completed.    Today you received the following chemotherapy and/or immunotherapy agents Darzalex      To help prevent nausea and vomiting after your treatment, we encourage you to take your nausea medication as directed.  BELOW ARE SYMPTOMS THAT SHOULD BE REPORTED IMMEDIATELY: *FEVER GREATER THAN 100.4 F (38 C) OR HIGHER *CHILLS OR SWEATING *NAUSEA AND VOMITING THAT IS NOT CONTROLLED WITH YOUR NAUSEA MEDICATION *UNUSUAL SHORTNESS OF BREATH *UNUSUAL BRUISING OR BLEEDING *URINARY PROBLEMS (pain or burning when urinating, or frequent urination) *BOWEL PROBLEMS (unusual diarrhea, constipation, pain near the anus) TENDERNESS IN MOUTH AND THROAT WITH OR WITHOUT PRESENCE OF ULCERS (sore throat, sores in mouth, or a toothache) UNUSUAL RASH, SWELLING OR PAIN  UNUSUAL VAGINAL DISCHARGE OR ITCHING   Items with * indicate a potential emergency and should be followed up as soon as possible or go to the Emergency Department if any problems should occur.  Please show the CHEMOTHERAPY ALERT CARD or IMMUNOTHERAPY ALERT CARD at check-in  to the Emergency Department and triage nurse. Should you have questions after your visit or need to cancel or reschedule your appointment, please contact [facility]  [phone] and follow the prompts.  Office hours are 8:00 a.m. to 4:30 p.m. Monday - Friday. Please note that voicemails left after 4:00 p.m. may not be returned until the following business day.  We are closed weekends and major holidays. You have access to a nurse at all times for urgent questions. Please call the main number to the clinic [phone] and follow the prompts.  For any non-urgent questions, you may also contact your provider using MyChart. We now offer e-Visits for anyone 18 and older to request care online for non-urgent symptoms. For details visit [email or web address].   Also download the MyChart app! Go to the app store, search "MyChart", open the app, select [facility], and log in with your MyChart username and password.   

## 2022-12-04 ENCOUNTER — Other Ambulatory Visit: Payer: Self-pay | Admitting: *Deleted

## 2022-12-04 ENCOUNTER — Inpatient Hospital Stay: Payer: Medicare Other

## 2022-12-04 ENCOUNTER — Telehealth: Payer: Self-pay

## 2022-12-04 ENCOUNTER — Other Ambulatory Visit: Payer: Self-pay

## 2022-12-04 DIAGNOSIS — Z95828 Presence of other vascular implants and grafts: Secondary | ICD-10-CM

## 2022-12-04 DIAGNOSIS — Z5111 Encounter for antineoplastic chemotherapy: Secondary | ICD-10-CM | POA: Diagnosis not present

## 2022-12-04 DIAGNOSIS — D649 Anemia, unspecified: Secondary | ICD-10-CM | POA: Diagnosis not present

## 2022-12-04 DIAGNOSIS — C541 Malignant neoplasm of endometrium: Secondary | ICD-10-CM

## 2022-12-04 DIAGNOSIS — Z79899 Other long term (current) drug therapy: Secondary | ICD-10-CM | POA: Diagnosis not present

## 2022-12-04 LAB — CBC WITH DIFFERENTIAL (CANCER CENTER ONLY)
Abs Immature Granulocytes: 0.6 10*3/uL — ABNORMAL HIGH (ref 0.00–0.07)
Band Neutrophils: 4 %
Basophils Absolute: 0 10*3/uL (ref 0.0–0.1)
Basophils Relative: 0 %
Eosinophils Absolute: 0 10*3/uL (ref 0.0–0.5)
Eosinophils Relative: 0 %
HCT: 23.5 % — ABNORMAL LOW (ref 36.0–46.0)
Hemoglobin: 7.6 g/dL — ABNORMAL LOW (ref 12.0–15.0)
Lymphocytes Relative: 5 %
Lymphs Abs: 2.8 10*3/uL (ref 0.7–4.0)
MCH: 33 pg (ref 26.0–34.0)
MCHC: 32.3 g/dL (ref 30.0–36.0)
MCV: 102.2 fL — ABNORMAL HIGH (ref 80.0–100.0)
Metamyelocytes Relative: 1 %
Monocytes Absolute: 0.6 10*3/uL (ref 0.1–1.0)
Monocytes Relative: 1 %
Neutro Abs: 52.5 10*3/uL — ABNORMAL HIGH (ref 1.7–7.7)
Neutrophils Relative %: 89 %
Platelet Count: 186 10*3/uL (ref 150–400)
RBC: 2.3 MIL/uL — ABNORMAL LOW (ref 3.87–5.11)
RDW: 19.1 % — ABNORMAL HIGH (ref 11.5–15.5)
Smear Review: NORMAL
WBC Count: 56.4 10*3/uL (ref 4.0–10.5)
nRBC: 0 % (ref 0.0–0.2)

## 2022-12-04 LAB — TYPE AND SCREEN

## 2022-12-04 LAB — SAMPLE TO BLOOD BANK

## 2022-12-04 LAB — PREPARE RBC (CROSSMATCH)

## 2022-12-04 NOTE — Telephone Encounter (Signed)
Critical result received from lab of WBC 56.4 Dr. Myna Hidalgo aware and no new orders received.

## 2022-12-05 ENCOUNTER — Inpatient Hospital Stay: Payer: Medicare Other

## 2022-12-05 DIAGNOSIS — D649 Anemia, unspecified: Secondary | ICD-10-CM

## 2022-12-05 DIAGNOSIS — Z5111 Encounter for antineoplastic chemotherapy: Secondary | ICD-10-CM | POA: Diagnosis not present

## 2022-12-05 DIAGNOSIS — Z95828 Presence of other vascular implants and grafts: Secondary | ICD-10-CM

## 2022-12-05 DIAGNOSIS — Z79899 Other long term (current) drug therapy: Secondary | ICD-10-CM | POA: Diagnosis not present

## 2022-12-05 LAB — TYPE AND SCREEN: Antibody Screen: NEGATIVE

## 2022-12-05 LAB — BPAM RBC

## 2022-12-05 MED ORDER — SODIUM CHLORIDE 0.9% IV SOLUTION
250.0000 mL | Freq: Once | INTRAVENOUS | Status: AC
  Administered 2022-12-05: 250 mL via INTRAVENOUS

## 2022-12-05 MED ORDER — HEPARIN SOD (PORK) LOCK FLUSH 100 UNIT/ML IV SOLN
500.0000 [IU] | Freq: Once | INTRAVENOUS | Status: AC
  Administered 2022-12-05: 500 [IU] via INTRAVENOUS

## 2022-12-05 MED ORDER — DIPHENHYDRAMINE HCL 25 MG PO CAPS
25.0000 mg | ORAL_CAPSULE | Freq: Once | ORAL | Status: AC
  Administered 2022-12-05: 25 mg via ORAL
  Filled 2022-12-05: qty 1

## 2022-12-05 MED ORDER — SODIUM CHLORIDE 0.9% FLUSH
10.0000 mL | INTRAVENOUS | Status: DC | PRN
  Administered 2022-12-05: 10 mL via INTRAVENOUS

## 2022-12-05 MED ORDER — ACETAMINOPHEN 325 MG PO TABS: 650.0000 mg | ORAL_TABLET | Freq: Once | ORAL | Status: DC

## 2022-12-05 NOTE — Patient Instructions (Signed)
Blood Transfusion, Adult A blood transfusion is a procedure in which you receive blood or a type of blood cell (blood component) through an IV. You may need a blood transfusion when you have a low blood count, which is a low number of any blood cell. This may result from a bleeding disorder, illness, injury, or surgery. The blood may come from a donor, or you may be able to have your own blood collected and stored (autologous blood donation) before a planned surgery. The blood given in a transfusion may be made up of different blood components. You may receive: Red blood cells. These carry oxygen to the cells in the body. Platelets. These help your blood to clot. Plasma. This is the liquid part of your blood. It carries proteins and other substances throughout the body. White blood cells. These help you fight infections. If you have hemophilia or another clotting disorder, you may also receive other types of blood products. Depending on the type of blood product, this procedure may take 1-4 hours to complete. Tell a health care provider about: Any bleeding problems you have. Any previous reactions you have had during a blood transfusion. Any allergies you have. All medicines you are taking, including vitamins, herbs, eye drops, creams, and over-the-counter medicines. Any surgeries you have had. Any medical conditions you have. Whether you are pregnant or may be pregnant. What are the risks? Talk with your health care provider about risks. The most common problems include: A mild allergic reaction, such as red, swollen areas of skin (hives) and itching. Fever or chills. This may be the body's response to new blood cells received. This may occur during or up to 4 hours after the transfusion. More serious problems may include: A serious allergic reaction that causes difficulty breathing or swelling around the face and lips. Transfusion-associated circulatory overload (TACO), or too much fluid in  the lungs. This may cause breathing problems. Transfusion-related acute lung injury (TRALI), which causes breathing difficulty and low oxygen in the blood. This can occur within hours of the transfusion or several days later. Iron overload. This can happen after receiving many blood transfusions over a period of time. Infection or virus being transmitted. This is rare because donated blood is carefully tested before it is given. Hemolytic transfusion reaction. This is rare. It happens when the body's defense system (immune system)tries to attack the new blood cells. Symptoms may include fever, chills, nausea, low blood pressure, and low back or chest pain. Transfusion-associated graft-versus-host disease (TAGVHD). This is rare. It happens when donated cells attack the body's healthy tissues. What happens before the procedure? You will have a blood test to check your blood type. This test is done to know what kind of blood your body will accept and to match it to the donor blood. If you are going to have a planned surgery, you may be able to do an autologous blood donation. This may be done in case you need to have a transfusion. You will have your temperature, blood pressure, and pulse checked before the transfusion. If you have had an allergic reaction to a transfusion in the past, you may be given medicine to help prevent a reaction. This medicine may be given to you by mouth (orally) or through an IV. What happens during the procedure?  An IV will be inserted into one of your veins. The bag of blood will be attached to your IV. The blood will then enter through your vein. Your temperature, blood pressure,   and pulse will be monitored during the transfusion. This monitoring is done to detect early signs of a transfusion reaction. Tell your nurse right away if you have any of these symptoms during the transfusion: Shortness of breath or trouble breathing. Chest or back pain. Fever or  chills. Itching or hives. If you have any signs or symptoms of a reaction, your transfusion will be stopped and you may be given medicine. When the transfusion is complete, your IV will be removed. Pressure may be applied to the IV site for a few minutes. A bandage (dressing)will be applied. The procedure may vary among health care providers and hospitals. What happens after the procedure? Your temperature, blood pressure, pulse, breathing rate, and blood oxygen level will be monitored until you leave the hospital or clinic. Your blood may be tested to see how you have responded to the transfusion. You may be warmed with fluids or blankets to maintain a normal body temperature. If you receive your blood transfusion in an outpatient setting, you will be told whom to contact to report any reactions. Where to find more information Visit the [employer]: redcross.org Summary A blood transfusion is a procedure in which you receive blood or a type of blood cell (blood component) through an IV. The blood given in a transfusion may be made up of different blood components. You may receive red blood cells, platelets, plasma, or white blood cells depending on the condition treated. Your temperature, blood pressure, and pulse will be monitored before, during, and after the transfusion. After the transfusion, your blood may be tested to see how your body has responded. This information is not intended to replace advice given to you by your health care provider. Make sure you discuss any questions you have with your health care provider. Document Revised: 08/26/2021 Document Reviewed: 08/26/2021 Elsevier Patient Education  2024 Elsevier Inc.   

## 2022-12-06 ENCOUNTER — Other Ambulatory Visit: Payer: Self-pay

## 2022-12-06 DIAGNOSIS — C541 Malignant neoplasm of endometrium: Secondary | ICD-10-CM

## 2022-12-06 LAB — TYPE AND SCREEN
ABO/RH(D): O POS
ABO/RH(D): O POS
Antibody Screen: NEGATIVE
Unit division: 0
Unit division: 0

## 2022-12-06 LAB — BPAM RBC
Blood Product Expiration Date: 202407222359
Blood Product Expiration Date: 202407222359
ISSUE DATE / TIME: 202406251119
ISSUE DATE / TIME: 202406251119
Unit Type and Rh: 9500
Unit Type and Rh: 9500

## 2022-12-07 ENCOUNTER — Encounter: Payer: Self-pay | Admitting: Hematology & Oncology

## 2022-12-07 ENCOUNTER — Inpatient Hospital Stay: Payer: Medicare Other

## 2022-12-07 ENCOUNTER — Other Ambulatory Visit: Payer: Self-pay | Admitting: *Deleted

## 2022-12-07 VITALS — BP 112/53 | HR 67 | Temp 98.1°F | Resp 18

## 2022-12-07 DIAGNOSIS — D649 Anemia, unspecified: Secondary | ICD-10-CM | POA: Diagnosis not present

## 2022-12-07 DIAGNOSIS — C541 Malignant neoplasm of endometrium: Secondary | ICD-10-CM

## 2022-12-07 DIAGNOSIS — Z95828 Presence of other vascular implants and grafts: Secondary | ICD-10-CM

## 2022-12-07 DIAGNOSIS — Z79899 Other long term (current) drug therapy: Secondary | ICD-10-CM | POA: Diagnosis not present

## 2022-12-07 DIAGNOSIS — Z5111 Encounter for antineoplastic chemotherapy: Secondary | ICD-10-CM | POA: Diagnosis not present

## 2022-12-07 LAB — CBC WITH DIFFERENTIAL (CANCER CENTER ONLY)
Abs Immature Granulocytes: 1.37 10*3/uL — ABNORMAL HIGH (ref 0.00–0.07)
Basophils Absolute: 0 10*3/uL (ref 0.0–0.1)
Basophils Relative: 0 %
Eosinophils Absolute: 0 10*3/uL (ref 0.0–0.5)
Eosinophils Relative: 0 %
HCT: 27.3 % — ABNORMAL LOW (ref 36.0–46.0)
Hemoglobin: 8.7 g/dL — ABNORMAL LOW (ref 12.0–15.0)
Immature Granulocytes: 5 %
Lymphocytes Relative: 8 %
Lymphs Abs: 2 10*3/uL (ref 0.7–4.0)
MCH: 31.4 pg (ref 26.0–34.0)
MCHC: 31.9 g/dL (ref 30.0–36.0)
MCV: 98.6 fL (ref 80.0–100.0)
Monocytes Absolute: 2 10*3/uL — ABNORMAL HIGH (ref 0.1–1.0)
Monocytes Relative: 8 %
Neutro Abs: 20.2 10*3/uL — ABNORMAL HIGH (ref 1.7–7.7)
Neutrophils Relative %: 79 %
Platelet Count: 104 10*3/uL — ABNORMAL LOW (ref 150–400)
RBC: 2.77 MIL/uL — ABNORMAL LOW (ref 3.87–5.11)
RDW: 19.1 % — ABNORMAL HIGH (ref 11.5–15.5)
Smear Review: NORMAL
WBC Count: 25.6 10*3/uL — ABNORMAL HIGH (ref 4.0–10.5)
nRBC: 0.2 % (ref 0.0–0.2)

## 2022-12-07 LAB — SAMPLE TO BLOOD BANK

## 2022-12-07 MED ORDER — SODIUM CHLORIDE 0.9% FLUSH
10.0000 mL | INTRAVENOUS | Status: DC | PRN
  Administered 2022-12-07: 10 mL via INTRAVENOUS

## 2022-12-07 MED ORDER — HEPARIN SOD (PORK) LOCK FLUSH 100 UNIT/ML IV SOLN
500.0000 [IU] | Freq: Once | INTRAVENOUS | Status: AC
  Administered 2022-12-07: 500 [IU] via INTRAVENOUS

## 2022-12-15 ENCOUNTER — Encounter: Payer: Self-pay | Admitting: Hematology & Oncology

## 2022-12-15 ENCOUNTER — Inpatient Hospital Stay: Payer: Medicare Other

## 2022-12-15 ENCOUNTER — Inpatient Hospital Stay: Payer: Medicare Other | Attending: Hematology & Oncology | Admitting: Hematology & Oncology

## 2022-12-15 ENCOUNTER — Other Ambulatory Visit: Payer: Self-pay

## 2022-12-15 VITALS — BP 105/51 | HR 72 | Temp 98.0°F | Resp 16 | Ht 66.0 in | Wt 124.0 lb

## 2022-12-15 DIAGNOSIS — I82401 Acute embolism and thrombosis of unspecified deep veins of right lower extremity: Secondary | ICD-10-CM

## 2022-12-15 DIAGNOSIS — R590 Localized enlarged lymph nodes: Secondary | ICD-10-CM

## 2022-12-15 DIAGNOSIS — Z1379 Encounter for other screening for genetic and chromosomal anomalies: Secondary | ICD-10-CM

## 2022-12-15 DIAGNOSIS — Z5111 Encounter for antineoplastic chemotherapy: Secondary | ICD-10-CM | POA: Diagnosis not present

## 2022-12-15 DIAGNOSIS — E063 Autoimmune thyroiditis: Secondary | ICD-10-CM

## 2022-12-15 DIAGNOSIS — Z79899 Other long term (current) drug therapy: Secondary | ICD-10-CM | POA: Diagnosis not present

## 2022-12-15 DIAGNOSIS — Z22322 Carrier or suspected carrier of Methicillin resistant Staphylococcus aureus: Secondary | ICD-10-CM

## 2022-12-15 DIAGNOSIS — L900 Lichen sclerosus et atrophicus: Secondary | ICD-10-CM

## 2022-12-15 DIAGNOSIS — I2699 Other pulmonary embolism without acute cor pulmonale: Secondary | ICD-10-CM

## 2022-12-15 DIAGNOSIS — D649 Anemia, unspecified: Secondary | ICD-10-CM | POA: Diagnosis not present

## 2022-12-15 DIAGNOSIS — Z803 Family history of malignant neoplasm of breast: Secondary | ICD-10-CM

## 2022-12-15 DIAGNOSIS — R898 Other abnormal findings in specimens from other organs, systems and tissues: Secondary | ICD-10-CM

## 2022-12-15 DIAGNOSIS — C541 Malignant neoplasm of endometrium: Secondary | ICD-10-CM

## 2022-12-15 DIAGNOSIS — N200 Calculus of kidney: Secondary | ICD-10-CM

## 2022-12-15 DIAGNOSIS — I6300 Cerebral infarction due to thrombosis of unspecified precerebral artery: Secondary | ICD-10-CM

## 2022-12-15 DIAGNOSIS — Z8042 Family history of malignant neoplasm of prostate: Secondary | ICD-10-CM

## 2022-12-15 DIAGNOSIS — Z7189 Other specified counseling: Secondary | ICD-10-CM

## 2022-12-15 DIAGNOSIS — Z800 Family history of malignant neoplasm of digestive organs: Secondary | ICD-10-CM

## 2022-12-15 LAB — CBC WITH DIFFERENTIAL (CANCER CENTER ONLY)
Abs Immature Granulocytes: 0.18 10*3/uL — ABNORMAL HIGH (ref 0.00–0.07)
Basophils Absolute: 0 10*3/uL (ref 0.0–0.1)
Basophils Relative: 0 %
Eosinophils Absolute: 0.3 10*3/uL (ref 0.0–0.5)
Eosinophils Relative: 3 %
HCT: 30.3 % — ABNORMAL LOW (ref 36.0–46.0)
Hemoglobin: 9.6 g/dL — ABNORMAL LOW (ref 12.0–15.0)
Immature Granulocytes: 2 %
Lymphocytes Relative: 16 %
Lymphs Abs: 1.2 10*3/uL (ref 0.7–4.0)
MCH: 31.4 pg (ref 26.0–34.0)
MCHC: 31.7 g/dL (ref 30.0–36.0)
MCV: 99 fL (ref 80.0–100.0)
Monocytes Absolute: 0.6 10*3/uL (ref 0.1–1.0)
Monocytes Relative: 8 %
Neutro Abs: 5.3 10*3/uL (ref 1.7–7.7)
Neutrophils Relative %: 71 %
Platelet Count: 272 10*3/uL (ref 150–400)
RBC: 3.06 MIL/uL — ABNORMAL LOW (ref 3.87–5.11)
RDW: 18.9 % — ABNORMAL HIGH (ref 11.5–15.5)
WBC Count: 7.6 10*3/uL (ref 4.0–10.5)
nRBC: 0 % (ref 0.0–0.2)

## 2022-12-15 LAB — CMP (CANCER CENTER ONLY)
ALT: 15 U/L (ref 0–44)
AST: 15 U/L (ref 15–41)
Albumin: 4.2 g/dL (ref 3.5–5.0)
Alkaline Phosphatase: 106 U/L (ref 38–126)
Anion gap: 8 (ref 5–15)
BUN: 15 mg/dL (ref 8–23)
CO2: 28 mmol/L (ref 22–32)
Calcium: 9.1 mg/dL (ref 8.9–10.3)
Chloride: 104 mmol/L (ref 98–111)
Creatinine: 0.79 mg/dL (ref 0.44–1.00)
GFR, Estimated: >60 mL/min (ref >60)
Glucose, Bld: 129 mg/dL — ABNORMAL HIGH (ref 70–99)
Potassium: 3.8 mmol/L (ref 3.5–5.1)
Sodium: 140 mmol/L (ref 135–145)
Total Bilirubin: 0.3 mg/dL (ref 0.3–1.2)
Total Protein: 6.2 g/dL — ABNORMAL LOW (ref 6.5–8.1)

## 2022-12-15 LAB — RETICULOCYTES
Immature Retic Fract: 23.7 % — ABNORMAL HIGH (ref 2.3–15.9)
RBC.: 3.07 MIL/uL — ABNORMAL LOW (ref 3.87–5.11)
Retic Count, Absolute: 88.7 10*3/uL (ref 19.0–186.0)
Retic Ct Pct: 2.9 % (ref 0.4–3.1)

## 2022-12-15 LAB — SAMPLE TO BLOOD BANK

## 2022-12-15 LAB — LACTATE DEHYDROGENASE: LDH: 226 U/L — ABNORMAL HIGH (ref 98–192)

## 2022-12-15 LAB — TYPE AND SCREEN: Antibody Screen: NEGATIVE

## 2022-12-15 MED ORDER — SODIUM CHLORIDE 0.9 % IV SOLN: Freq: Once | INTRAVENOUS | Status: DC

## 2022-12-15 MED ORDER — SODIUM CHLORIDE 0.9 % IV SOLN
656.1000 mg/m2 | Freq: Once | INTRAVENOUS | Status: AC
  Administered 2022-12-15: 1102 mg via INTRAVENOUS
  Filled 2022-12-15: qty 25.98

## 2022-12-15 MED ORDER — SODIUM CHLORIDE 0.9 % IV SOLN
10.0000 mg | Freq: Once | INTRAVENOUS | Status: AC
  Administered 2022-12-15: 10 mg via INTRAVENOUS
  Filled 2022-12-15: qty 10

## 2022-12-15 MED ORDER — POTASSIUM CHLORIDE IN NACL 20-0.9 MEQ/L-% IV SOLN
Freq: Once | INTRAVENOUS | Status: AC
  Filled 2022-12-15: qty 1000

## 2022-12-15 MED ORDER — SODIUM CHLORIDE 0.9 % IV SOLN
150.0000 mg | Freq: Once | INTRAVENOUS | Status: AC
  Administered 2022-12-15: 150 mg via INTRAVENOUS
  Filled 2022-12-15: qty 150

## 2022-12-15 MED ORDER — MAGNESIUM SULFATE 2 GM/50ML IV SOLN
2.0000 g | Freq: Once | INTRAVENOUS | Status: AC
  Administered 2022-12-15: 2 g via INTRAVENOUS
  Filled 2022-12-15: qty 50

## 2022-12-15 MED ORDER — PALONOSETRON HCL INJECTION 0.25 MG/5ML
0.2500 mg | Freq: Once | INTRAVENOUS | Status: AC
  Administered 2022-12-15: 0.25 mg via INTRAVENOUS
  Filled 2022-12-15: qty 5

## 2022-12-15 MED ORDER — SODIUM CHLORIDE 0.9 % IV SOLN
51.0300 mg/m2 | Freq: Once | INTRAVENOUS | Status: AC
  Administered 2022-12-15: 85 mg via INTRAVENOUS
  Filled 2022-12-15: qty 85

## 2022-12-15 MED ORDER — SODIUM CHLORIDE 0.9% FLUSH
10.0000 mL | INTRAVENOUS | Status: DC | PRN
  Administered 2022-12-15: 10 mL

## 2022-12-15 MED ORDER — HEPARIN SOD (PORK) LOCK FLUSH 100 UNIT/ML IV SOLN
500.0000 [IU] | Freq: Once | INTRAVENOUS | Status: AC | PRN
  Administered 2022-12-15: 500 [IU]

## 2022-12-15 MED ORDER — SODIUM CHLORIDE 0.9 % IV SOLN: Freq: Once | INTRAVENOUS | Status: AC

## 2022-12-15 NOTE — Progress Notes (Signed)
Hematology and Oncology Follow Up Visit  Sherry Williams [id number] 1960-07-10 62 y.o. 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  She is doing quite nicely.  The only problem she has been anemia.  I suspect this probably is from the chemotherapy.  We had to transfuse her.  She is receiving blood that has been donated by friends to have never had the COVID-vaccine.  Her last CA-125 was down to 16.  Again she has responded incredibly well.  She did do the Signatura assay.  This was 0.24.  I think this is a very indicator that she is eradicating her disease.  She is still doing her complementary therapy.  She continues on the Arixtra.  She is doing well on the Arixtra without any bleeding.  She has had no cough or shortness of breath.  There is been no headache.  She has had no mouth sores.  There is been no problems with bowels or bladder.  She has had no rashes.  There is been no leg swelling.  Currently, I would have said that her performance status is probably ECOG 1.     Medications:  Current Outpatient Medications:    ARMOUR THYROID 90 MG tablet, Take 90 mg by mouth every morning., Disp: , Rfl:    aspirin EC (ASPIR-LOW) 81 MG tablet, Take 81 mg by mouth daily., Disp: , Rfl:    cimetidine (TAGAMET) 400 MG tablet, Take 400 mg by mouth 2 (two) times daily., Disp: , Rfl:    dexamethasone (DECADRON) 4 MG tablet, Take 2 tablets (8 mg) by mouth daily x 3 days starting the day after cisplatin chemotherapy. Take with food., Disp: 30 tablet, Rfl: 1   dipyridamole (PERSANTINE) 75 MG tablet, Take 75 mg by mouth 2 (two)  times daily., Disp: , Rfl:    doxycycline (MONODOX) 100 MG capsule, Take 100 mg by mouth 2 (two) times daily., Disp: , Rfl:    doxycycline (VIBRAMYCIN) 100 MG capsule, Take 100 mg by mouth daily. 08/31/2022 Takes for 30 days/off 30 days., Disp: , Rfl:    fondaparinux (ARIXTRA) 7.5 MG/0.6ML SOLN injection, Inject 0.6 mLs (7.5 mg total) into the skin daily., Disp: 18 mL, Rfl: 4   lidocaine-prilocaine (EMLA) cream, APPLY TOPICALLY TO THE AFFECTED AREA 1 TIME, Disp: 30 g, Rfl: 4   lovastatin (MEVACOR) 20 MG tablet, Take 20 mg by mouth at bedtime., Disp: , Rfl:    metFORMIN (GLUCOPHAGE) 500 MG tablet, Take 500 mg by mouth 2 (two) times daily., Disp: , Rfl:    Naltrexone HCl, Pain, (NALTREX) 4.5 MG CAPS, Take by mouth at bedtime., Disp: , Rfl:    OLANZapine (ZYPREXA) 10 MG tablet, Take 1 tablet (10 mg total) by mouth at bedtime., Disp: 30 tablet, Rfl: 6   ondansetron (ZOFRAN) 8 MG tablet, Take 1 tablet (8 mg total) by mouth every 8 (eight) hours as needed for nausea or vomiting. Start on the third day after cisplatin., Disp: 60 tablet, Rfl: 4   Probiotic Product (PROBIOTIC BLEND PO), Take by mouth daily., Disp: , Rfl:    prochlorperazine (COMPAZINE) 10 MG tablet,  TAKE 1 TABLET(10 MG) BY MOUTH EVERY 6 HOURS AS NEEDED FOR NAUSEA OR VOMITING, Disp: 30 tablet, Rfl: 1  Allergies:  Allergies  Allergen Reactions   Bee Venom Anaphylaxis, Swelling and Other (See Comments)   Aspartame Diarrhea   Aspartame And Phenylalanine Diarrhea   Adhesive [Tape] Other (See Comments)    Irritation and red    Past Medical History, Surgical history, Social history, and Family History were reviewed and updated.  Review of Systems: Review of Systems  Constitutional: Negative.   HENT: Negative.    Eyes: Negative.   Respiratory: Negative.    Cardiovascular: Negative.   Gastrointestinal:  Positive for abdominal pain.  Genitourinary: Negative.   Musculoskeletal: Negative.   Skin: Negative.   Neurological: Negative.    Endo/Heme/Allergies: Negative.   Psychiatric/Behavioral: Negative.      Physical Exam: Her vital signs show temperature of 98.  Pulse 72.  Blood pressure 105/51.  Weight is 124 pounds.      Wt Readings from Last 3 Encounters:  12/15/22 124 lb (56.2 kg)  11/24/22 122 lb 1.9 oz (55.4 kg)  11/10/22 122 lb (55.3 kg)    Physical Exam Vitals reviewed.  HENT:     Head: Normocephalic and atraumatic.  Eyes:     Pupils: Pupils are equal, round, and reactive to light.  Cardiovascular:     Rate and Rhythm: Normal rate and regular rhythm.     Heart sounds: Normal heart sounds.  Pulmonary:     Effort: Pulmonary effort is normal.     Breath sounds: Normal breath sounds.  Abdominal:     General: Bowel sounds are normal.     Palpations: Abdomen is soft.     Comments: Abdominal exam shows a soft abdomen.  She has well healing laparoscopic scars.  I think there are 5 laparoscopic scars.  She has no erythema or exudate or warmth associated with these.  She has no fluid wave in the abdomen.  There is no guarding or rebound tenderness.  She has no palpable liver or spleen tip.  Musculoskeletal:        General: No tenderness or deformity. Normal range of motion.     Cervical back: Normal range of motion.  Lymphadenopathy:     Cervical: No cervical adenopathy.  Skin:    General: Skin is warm and dry.     Findings: No erythema or rash.  Neurological:     Mental Status: She is alert and oriented to person, place, and time.  Psychiatric:        Behavior: Behavior normal.        Thought Content: Thought content normal.        Judgment: Judgment normal.     Lab Results  Component Value Date   WBC 7.6 12/15/2022   HGB 9.6 (L) 12/15/2022   HCT 30.3 (L) 12/15/2022   MCV 99.0 12/15/2022   PLT 272 12/15/2022     Chemistry      Component Value Date/Time   NA 138 12/01/2022 0855   NA 142 02/15/2017 1518   NA 140 04/13/2016 1055   K 4.1 12/01/2022 0855   K 4.1 02/15/2017 1518   K 4.1  04/13/2016 1055   CL 102 12/01/2022 0855   CL 107 02/15/2017 1518   CO2 29 12/01/2022 0855   CO2 29 02/15/2017 1518   CO2 27 04/13/2016 1055   BUN 11 12/01/2022 0855   BUN 17 02/15/2017 1518   BUN 22.1 04/13/2016 1055   CREATININE  0.70 12/01/2022 0855   CREATININE 1.0 02/15/2017 1518   CREATININE 0.8 04/13/2016 1055      Component Value Date/Time   CALCIUM 9.2 12/01/2022 0855   CALCIUM 9.3 02/15/2017 1518   CALCIUM 9.9 04/13/2016 1055   ALKPHOS 68 12/01/2022 0855   ALKPHOS 79 02/15/2017 1518   ALKPHOS 81 04/13/2016 1055   AST 17 12/01/2022 0855   AST 20 04/13/2016 1055   ALT 23 12/01/2022 0855   ALT 24 02/15/2017 1518   ALT 22 04/13/2016 1055   BILITOT 0.3 12/01/2022 0855   BILITOT 0.44 04/13/2016 1055      Impression and Plan: Ms. Troupe is a 62 year old Caucasian female.  She has another recurrence of her gynecologic malignancy.  I am really not surprised that she has had a very nice response to chemoimmunotherapy.  It has been several years that she actually has had chemotherapy.  This will be her last full cycle of chemoimmunotherapy.  After this, I would probably put her on maintenance with immunotherapy.  I think this would be very reasonable for [place] to do.  Again she has responded incredibly well.  The CA-125 is certainly a good marker for [place] with respect to disease response.  We will go ahead and give her a transfusion today.  Hemoglobin is not that bad but I know it will drop lower with treatment.  As always, she has incredible support from her family.  She is incredibly motivated.  We will go ahead and plan to get her back in about a month or so.  At that time, we will see about maintenance immunotherapy.   Josph Macho, MD 7/5/20248:46 AM

## 2022-12-15 NOTE — Patient Instructions (Signed)
[facility]  Discharge Instructions: Thank you for choosing [facility] to provide your oncology and hematology care.   If you have a lab appointment with the Cancer Center, please go directly to the Cancer Center and check in at the registration area.  Wear comfortable clothing and clothing appropriate for easy access to any Portacath or PICC line.   We strive to give you quality time with your provider. You may need to reschedule your appointment if you arrive late (15 or more minutes).  Arriving late affects you and other patients whose appointments are after yours.  Also, if you miss three or more appointments without notifying the office, you may be dismissed from the clinic at the provider's discretion.      For prescription refill requests, have your pharmacy contact our office and allow 72 hours for refills to be completed.    Today you received the following chemotherapy and/or immunotherapy agents Cisplatin      To help prevent nausea and vomiting after your treatment, we encourage you to take your nausea medication as directed.  BELOW ARE SYMPTOMS THAT SHOULD BE REPORTED IMMEDIATELY: *FEVER GREATER THAN 100.4 F (38 C) OR HIGHER *CHILLS OR SWEATING *NAUSEA AND VOMITING THAT IS NOT CONTROLLED WITH YOUR NAUSEA MEDICATION *UNUSUAL SHORTNESS OF BREATH *UNUSUAL BRUISING OR BLEEDING *URINARY PROBLEMS (pain or burning when urinating, or frequent urination) *BOWEL PROBLEMS (unusual diarrhea, constipation, pain near the anus) TENDERNESS IN MOUTH AND THROAT WITH OR WITHOUT PRESENCE OF ULCERS (sore throat, sores in mouth, or a toothache) UNUSUAL RASH, SWELLING OR PAIN  UNUSUAL VAGINAL DISCHARGE OR ITCHING   Items with * indicate a potential emergency and should be followed up as soon as possible or go to the Emergency Department if any problems should occur.  Please show the CHEMOTHERAPY ALERT CARD or IMMUNOTHERAPY ALERT CARD at  check-in to the Emergency Department and triage nurse. Should you have questions after your visit or need to cancel or reschedule your appointment, please contact [facility]  [phone] and follow the prompts.  Office hours are 8:00 a.m. to 4:30 p.m. Monday - Friday. Please note that voicemails left after 4:00 p.m. may not be returned until the following business day.  We are closed weekends and major holidays. You have access to a nurse at all times for urgent questions. Please call the main number to the clinic [phone] and follow the prompts.  For any non-urgent questions, you may also contact your provider using MyChart. We now offer e-Visits for anyone 18 and older to request care online for non-urgent symptoms. For details visit [email or web address].   Also download the MyChart app! Go to the app store, search "MyChart", open the app, select [facility], and log in with your MyChart username and password.   

## 2022-12-15 NOTE — Patient Instructions (Signed)

## 2022-12-16 ENCOUNTER — Encounter: Payer: Self-pay | Admitting: Hematology & Oncology

## 2022-12-16 LAB — CA 125: Cancer Antigen (CA) 125: 11.3 U/mL (ref 0.0–38.1)

## 2022-12-18 ENCOUNTER — Inpatient Hospital Stay: Payer: Medicare Other

## 2022-12-18 DIAGNOSIS — N200 Calculus of kidney: Secondary | ICD-10-CM

## 2022-12-18 DIAGNOSIS — E063 Autoimmune thyroiditis: Secondary | ICD-10-CM

## 2022-12-18 DIAGNOSIS — Z5111 Encounter for antineoplastic chemotherapy: Secondary | ICD-10-CM | POA: Diagnosis not present

## 2022-12-18 DIAGNOSIS — I82401 Acute embolism and thrombosis of unspecified deep veins of right lower extremity: Secondary | ICD-10-CM

## 2022-12-18 DIAGNOSIS — I6300 Cerebral infarction due to thrombosis of unspecified precerebral artery: Secondary | ICD-10-CM

## 2022-12-18 DIAGNOSIS — D649 Anemia, unspecified: Secondary | ICD-10-CM | POA: Diagnosis not present

## 2022-12-18 DIAGNOSIS — Z1379 Encounter for other screening for genetic and chromosomal anomalies: Secondary | ICD-10-CM

## 2022-12-18 DIAGNOSIS — Z79899 Other long term (current) drug therapy: Secondary | ICD-10-CM | POA: Diagnosis not present

## 2022-12-18 DIAGNOSIS — C541 Malignant neoplasm of endometrium: Secondary | ICD-10-CM

## 2022-12-18 DIAGNOSIS — Z8042 Family history of malignant neoplasm of prostate: Secondary | ICD-10-CM

## 2022-12-18 DIAGNOSIS — Z7189 Other specified counseling: Secondary | ICD-10-CM

## 2022-12-18 DIAGNOSIS — Z800 Family history of malignant neoplasm of digestive organs: Secondary | ICD-10-CM

## 2022-12-18 DIAGNOSIS — I2699 Other pulmonary embolism without acute cor pulmonale: Secondary | ICD-10-CM

## 2022-12-18 DIAGNOSIS — L900 Lichen sclerosus et atrophicus: Secondary | ICD-10-CM

## 2022-12-18 DIAGNOSIS — R898 Other abnormal findings in specimens from other organs, systems and tissues: Secondary | ICD-10-CM

## 2022-12-18 DIAGNOSIS — Z22322 Carrier or suspected carrier of Methicillin resistant Staphylococcus aureus: Secondary | ICD-10-CM

## 2022-12-18 DIAGNOSIS — R590 Localized enlarged lymph nodes: Secondary | ICD-10-CM

## 2022-12-18 DIAGNOSIS — Z803 Family history of malignant neoplasm of breast: Secondary | ICD-10-CM

## 2022-12-18 LAB — TYPE AND SCREEN

## 2022-12-18 LAB — BPAM RBC
Blood Product Expiration Date: 202408032359
Unit Type and Rh: 5100

## 2022-12-18 MED ORDER — HEPARIN SOD (PORK) LOCK FLUSH 100 UNIT/ML IV SOLN
250.0000 [IU] | INTRAVENOUS | Status: AC | PRN
  Administered 2022-12-18: 250 [IU]

## 2022-12-18 MED ORDER — ACETAMINOPHEN 325 MG PO TABS: 650.0000 mg | ORAL_TABLET | Freq: Once | ORAL | Status: DC

## 2022-12-18 MED ORDER — DIPHENHYDRAMINE HCL 25 MG PO CAPS: 25.0000 mg | ORAL_CAPSULE | Freq: Once | ORAL | Status: DC

## 2022-12-18 MED ORDER — SODIUM CHLORIDE 0.9% IV SOLUTION
250.0000 mL | Freq: Once | INTRAVENOUS | Status: AC
  Administered 2022-12-18: 250 mL via INTRAVENOUS

## 2022-12-18 NOTE — Patient Instructions (Signed)

## 2022-12-19 LAB — TYPE AND SCREEN
ABO/RH(D): O POS
Unit division: 0

## 2022-12-19 LAB — BPAM RBC: ISSUE DATE / TIME: 202407080730

## 2022-12-22 ENCOUNTER — Inpatient Hospital Stay: Payer: Medicare Other

## 2022-12-22 VITALS — BP 86/55 | HR 58

## 2022-12-22 DIAGNOSIS — Z5111 Encounter for antineoplastic chemotherapy: Secondary | ICD-10-CM | POA: Diagnosis not present

## 2022-12-22 DIAGNOSIS — C541 Malignant neoplasm of endometrium: Secondary | ICD-10-CM

## 2022-12-22 DIAGNOSIS — D649 Anemia, unspecified: Secondary | ICD-10-CM | POA: Diagnosis not present

## 2022-12-22 DIAGNOSIS — Z79899 Other long term (current) drug therapy: Secondary | ICD-10-CM | POA: Diagnosis not present

## 2022-12-22 LAB — CMP (CANCER CENTER ONLY)
ALT: 19 U/L (ref 0–44)
AST: 15 U/L (ref 15–41)
Albumin: 4.2 g/dL (ref 3.5–5.0)
Alkaline Phosphatase: 69 U/L (ref 38–126)
Anion gap: 7 (ref 5–15)
BUN: 13 mg/dL (ref 8–23)
CO2: 28 mmol/L (ref 22–32)
Calcium: 9 mg/dL (ref 8.9–10.3)
Chloride: 103 mmol/L (ref 98–111)
Creatinine: 0.75 mg/dL (ref 0.44–1.00)
GFR, Estimated: >60 mL/min (ref >60)
Glucose, Bld: 112 mg/dL — ABNORMAL HIGH (ref 70–99)
Potassium: 4 mmol/L (ref 3.5–5.1)
Sodium: 138 mmol/L (ref 135–145)
Total Bilirubin: 0.4 mg/dL (ref 0.3–1.2)
Total Protein: 6 g/dL — ABNORMAL LOW (ref 6.5–8.1)

## 2022-12-22 LAB — CBC WITH DIFFERENTIAL (CANCER CENTER ONLY)
Abs Immature Granulocytes: 0.01 10*3/uL (ref 0.00–0.07)
Basophils Absolute: 0 10*3/uL (ref 0.0–0.1)
Basophils Relative: 1 %
Eosinophils Absolute: 0 10*3/uL (ref 0.0–0.5)
Eosinophils Relative: 2 %
HCT: 34.2 % — ABNORMAL LOW (ref 36.0–46.0)
Hemoglobin: 11.1 g/dL — ABNORMAL LOW (ref 12.0–15.0)
Immature Granulocytes: 1 %
Lymphocytes Relative: 41 %
Lymphs Abs: 0.9 10*3/uL (ref 0.7–4.0)
MCH: 31.7 pg (ref 26.0–34.0)
MCHC: 32.5 g/dL (ref 30.0–36.0)
MCV: 97.7 fL (ref 80.0–100.0)
Monocytes Absolute: 0.3 10*3/uL (ref 0.1–1.0)
Monocytes Relative: 14 %
Neutro Abs: 0.9 10*3/uL — ABNORMAL LOW (ref 1.7–7.7)
Neutrophils Relative %: 41 %
Platelet Count: 319 10*3/uL (ref 150–400)
RBC: 3.5 MIL/uL — ABNORMAL LOW (ref 3.87–5.11)
RDW: 16.9 % — ABNORMAL HIGH (ref 11.5–15.5)
WBC Count: 2.2 10*3/uL — ABNORMAL LOW (ref 4.0–10.5)
nRBC: 0 % (ref 0.0–0.2)

## 2022-12-22 MED ORDER — PROCHLORPERAZINE MALEATE 10 MG PO TABS: 10.0000 mg | ORAL_TABLET | Freq: Once | ORAL | Status: DC

## 2022-12-22 MED ORDER — SODIUM CHLORIDE 0.9% FLUSH
10.0000 mL | INTRAVENOUS | Status: DC | PRN
  Administered 2022-12-22: 10 mL

## 2022-12-22 MED ORDER — SODIUM CHLORIDE 0.9 % IV SOLN: Freq: Once | INTRAVENOUS | Status: DC

## 2022-12-22 MED ORDER — HEPARIN SOD (PORK) LOCK FLUSH 100 UNIT/ML IV SOLN
500.0000 [IU] | Freq: Once | INTRAVENOUS | Status: AC | PRN
  Administered 2022-12-22: 500 [IU]

## 2022-12-22 MED ORDER — SODIUM CHLORIDE 0.9 % IV SOLN: 656.1000 mg/m2 | Freq: Once | INTRAVENOUS | Status: DC

## 2022-12-22 MED ORDER — SODIUM CHLORIDE 0.9 % IV SOLN
400.0000 mg | Freq: Once | INTRAVENOUS | Status: AC
  Administered 2022-12-22: 400 mg via INTRAVENOUS
  Filled 2022-12-22: qty 16

## 2022-12-22 MED ORDER — SODIUM CHLORIDE 0.9 % IV SOLN: Freq: Once | INTRAVENOUS | Status: AC

## 2022-12-22 NOTE — Patient Instructions (Signed)

## 2022-12-22 NOTE — Progress Notes (Signed)
MD reviewed cbc and cmet. NO GEMZAR d/t counts. OK to proceed with Keytruda.

## 2022-12-22 NOTE — Patient Instructions (Signed)

## 2022-12-23 ENCOUNTER — Other Ambulatory Visit: Payer: Self-pay

## 2022-12-24 ENCOUNTER — Other Ambulatory Visit: Payer: Self-pay

## 2022-12-25 ENCOUNTER — Encounter: Payer: Self-pay | Admitting: *Deleted

## 2022-12-25 ENCOUNTER — Inpatient Hospital Stay: Payer: Medicare Other

## 2022-12-25 ENCOUNTER — Telehealth: Payer: Self-pay

## 2022-12-25 ENCOUNTER — Inpatient Hospital Stay: Payer: Medicare Other | Admitting: Medical Oncology

## 2022-12-25 ENCOUNTER — Ambulatory Visit (HOSPITAL_COMMUNITY)
Admission: RE | Admit: 2022-12-25 | Discharge: 2022-12-25 | Disposition: A | Discharge status: Home / Self Care | Payer: Medicare Other | Source: Ambulatory Visit | Attending: Medical Oncology | Admitting: Medical Oncology

## 2022-12-25 DIAGNOSIS — Z452 Encounter for adjustment and management of vascular access device: Secondary | ICD-10-CM

## 2022-12-25 DIAGNOSIS — M542 Cervicalgia: Secondary | ICD-10-CM | POA: Diagnosis not present

## 2022-12-25 HISTORY — PX: IR CV LINE INJECTION: IMG2294

## 2022-12-25 MED ORDER — TRIAMCINOLONE ACETONIDE 0.5 % EX OINT: 1.0000 | TOPICAL_OINTMENT | Freq: Two times a day (BID) | CUTANEOUS | 0 refills | Status: DC

## 2022-12-25 MED ORDER — HEPARIN SOD (PORK) LOCK FLUSH 100 UNIT/ML IV SOLN
INTRAVENOUS | Status: AC
  Filled 2022-12-25: qty 5

## 2022-12-25 MED ORDER — IOHEXOL 300 MG/ML  SOLN
50.0000 mL | Freq: Once | INTRAMUSCULAR | Status: AC | PRN
  Administered 2022-12-25: 8 mL via INTRAVENOUS

## 2022-12-25 NOTE — Patient Instructions (Signed)

## 2022-12-25 NOTE — Progress Notes (Signed)
Hematology and Oncology Follow Up Visit  Sherry Williams [id number] 06/04/1961 62 y.o. 12/25/2022  Past Medical History:  Diagnosis Date   Abnormal genetic test    Allergy    Arthritis    DVT (deep venous thrombosis) (HCC)    lower extremity    Endometrial ca (HCC) 03/14/2017   uterine cancer 2018   Family history of adverse reaction to anesthesia    children had N/V    Family history of breast cancer    Family history of pancreatic cancer    Family history of prostate cancer in father    Goals of care, counseling/discussion 12/10/2019   History of kidney stones    Hypothyroidism    Hashimoto   Lichen sclerosus    MRSA (methicillin resistant staph aureus) culture positive    Pneumonia    walking   Pulmonary embolism (HCC) 01/13/2016   After car trip   Stroke [facility])    multiple     2-16 -2021, 4-15- 2021 no defecits   do to blood clots    Principle Diagnosis:  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 is here for an unscheduled visit.Initially she was advised to see the medical practice who accessed the port however this is far from her house and she asked if she could be seen here instead. She reports that since 2022 she has gotten twice weekly vitamin C infusions from [facility]. Today she had her port accessed and received her infusion. When her port was first accessed they noticed that she bled a bit more than normal. Now the area is painful. She is still accessed as she stated that they see her on day 3 for her second injection. Tomorrow she is scheduled to flush her port-a-cath.   She denies any fevers, chest pains, swelling. Other than the asa  and tylenol she has not put anything on the area. This has never happened before.      Wt Readings from Last 3 Encounters:  12/15/22 124 lb (56.2 kg)  11/24/22 122 lb 1.9 oz (55.4 kg)  11/10/22 122 lb (55.3 kg)     Medications:   Current Outpatient Medications:    triamcinolone ointment (KENALOG) 0.5 %, Apply 1 Application topically 2 (two) times daily., Disp: 30 g, Rfl: 0   ARMOUR THYROID 90 MG tablet, Take 90 mg by mouth every morning., Disp: , Rfl:    aspirin EC (ASPIR-LOW) 81 MG tablet, Take 81 mg by mouth daily., Disp: , Rfl:    cimetidine (TAGAMET) 400 MG tablet, Take 400 mg by mouth 2 (two) times daily., Disp: , Rfl:    dexamethasone (DECADRON) 4 MG tablet, Take 2 tablets (8 mg) by mouth daily x 3 days starting the day after cisplatin chemotherapy. Take with food., Disp: 30 tablet, Rfl: 1   dipyridamole (PERSANTINE) 75 MG tablet, Take 75 mg by mouth 2 (two) times daily., Disp: , Rfl:    doxycycline (MONODOX) 100 MG capsule, Take 100 mg by mouth 2 (two) times daily., Disp: , Rfl:    doxycycline (VIBRAMYCIN) 100 MG capsule, Take 100 mg by mouth daily. 08/31/2022 Takes for 30 days/off 30 days., Disp: , Rfl:  fondaparinux (ARIXTRA) 7.5 MG/0.6ML SOLN injection, Inject 0.6 mLs (7.5 mg total) into the skin daily., Disp: 18 mL, Rfl: 4   lidocaine-prilocaine (EMLA) cream, APPLY TOPICALLY TO THE AFFECTED AREA 1 TIME, Disp: 30 g, Rfl: 4   lovastatin (MEVACOR) 20 MG tablet, Take 20 mg by mouth at bedtime., Disp: , Rfl:    metFORMIN (GLUCOPHAGE) 500 MG tablet, Take 500 mg by mouth 2 (two) times daily., Disp: , Rfl:    Naltrexone HCl, Pain, (NALTREX) 4.5 MG CAPS, Take by mouth at bedtime., Disp: , Rfl:    OLANZapine (ZYPREXA) 10 MG tablet, Take 1 tablet (10 mg total) by mouth at bedtime., Disp: 30 tablet, Rfl: 6   ondansetron (ZOFRAN) 8 MG tablet, Take 1 tablet (8 mg total) by mouth every 8 (eight) hours as needed for nausea or vomiting. Start on the third day after cisplatin., Disp: 60 tablet,  Rfl: 4   Probiotic Product (PROBIOTIC BLEND PO), Take by mouth daily., Disp: , Rfl:    prochlorperazine (COMPAZINE) 10 MG tablet, TAKE 1 TABLET(10 MG) BY MOUTH EVERY 6 HOURS AS NEEDED FOR NAUSEA OR VOMITING, Disp: 30 tablet, Rfl: 1  Allergies:  Allergies  Allergen Reactions   Bee Venom Anaphylaxis, Swelling and Other (See Comments)   Aspartame Diarrhea   Aspartame And Phenylalanine Diarrhea   Adhesive [Tape] Other (See Comments)    Irritation and red    Past Medical History, Surgical history, Social history, and Family History were reviewed and updated.  Review of Systems: As stated above in HPI  Physical Exam: Vitals: Deferred  General: NAD Neck: No pulsatile mass or abnormality  Skin: Port-a-cath is accessed. There is mild ecchymosis superficial to the port-a-cath access site. There is what appears to be a darker ecchymotic hue trailing superiorly up the chest. No pooling or hematoma noted. Chest wall otherwise appears normal.  Neurological: Weakness but otherwise nonfocal   Lab Results  Component Value Date   WBC 2.2 (L) 12/22/2022   HGB 11.1 (L) 12/22/2022   HCT 34.2 (L) 12/22/2022   MCV 97.7 12/22/2022   PLT 319 12/22/2022     Chemistry      Component Value Date/Time   NA 138 12/22/2022 0845   NA 142 02/15/2017 1518   NA 140 04/13/2016 1055   K 4.0 12/22/2022 0845   K 4.1 02/15/2017 1518   K 4.1 04/13/2016 1055   CL 103 12/22/2022 0845   CL 107 02/15/2017 1518   CO2 28 12/22/2022 0845   CO2 29 02/15/2017 1518   CO2 27 04/13/2016 1055   BUN 13 12/22/2022 0845   BUN 17 02/15/2017 1518   BUN 22.1 04/13/2016 1055   CREATININE 0.75 12/22/2022 0845   CREATININE 1.0 02/15/2017 1518   CREATININE 0.8 04/13/2016 1055      Component Value Date/Time   CALCIUM 9.0 12/22/2022 0845   CALCIUM 9.3 02/15/2017 1518   CALCIUM 9.9 04/13/2016 1055   ALKPHOS 69 12/22/2022 0845   ALKPHOS 79 02/15/2017 1518   ALKPHOS 81 04/13/2016 1055   AST 15 12/22/2022 0845   AST 20  04/13/2016 1055   ALT 19 12/22/2022 0845   ALT 24 02/15/2017 1518   ALT 22 04/13/2016 1055   BILITOT 0.4 12/22/2022 0845   BILITOT 0.44 04/13/2016 1055      Assessment and Plan- Patient is a 62 y.o. female    Encounter Diagnosis  Name Primary?   Encounter for care related to Port-a-Cath Yes   Irritation secondary to the vitamin C  infusion vs port failure vs other. Does not appear infectious at this time given lack of erythema, fever, etc. Dye study will help to ensure the port is working properly- she will need to stay accessed for the dye study. We have flushed with normal saline as we are getting good blood return to prevent complications in case this is irritation secondary to the vitamin C. I will also call in a steroid cream for her to apply. Follow up TBD depending on dye study results.    Clent Jacks PA-C 7/15/20243:41 PM

## 2022-12-25 NOTE — Progress Notes (Signed)
Patient arrived to our clinic with a accessed portacath.  She had this portacath placed today by an infusion clinic where she takes homeopathic treatments.  Dressing removed and port site is bruised looking and swollen.  All along the vein going up her neck is discolored.  Portacath site has been sore to touch although at present moment she had taken pain medication and it wasn't sore.  Clent Jacks PA here to assess.  Excellent blood return noted.  Dye study ordered.  Portacath needle left intact.  Patient discharged with husband to go to [facility] for dye study.

## 2022-12-25 NOTE — Progress Notes (Signed)
Received call from patient that her port was painful. She was accessed at [facility] today and received High Dose Vitamin C. She stayed accessed for her next treatment on Wednesday which is what she's been doing for years and didn't notice anything different when they accessed her today. After the infusion finished and she was capped off, she noticed the pain, which radiated to her clavicle. She denies swelling at the site. She says one of the veins look more prominent.   When it was recommended to return that that clinc, she said they are over 50 minutes away and they don't manage my port. I explained that if they are accessing and treating her, during that episode they are managing her port. Patient didn't feel that [facility] should be response and told me they have no access to xray or [place].   Spoke to providers and Print production planner. Will bring patient in to visually inspect the port.   Oncology Nurse Navigator Documentation     12/25/2022    1:30 PM  Oncology Nurse Navigator Flowsheets  Navigator Location [facility]  Navigator Encounter Type Telephone  Telephone Incoming Call  Patient Visit Type MedOnc  Treatment Phase Active Tx  Interventions Coordination of Care;Education  Acuity Level 2-Minimal Needs (1-2 Barriers Identified)  Coordination of Care Appts  Education Method Verbal  Time Spent with Patient 30

## 2022-12-25 NOTE — Telephone Encounter (Signed)
error 

## 2023-01-01 ENCOUNTER — Telehealth: Payer: Self-pay | Admitting: *Deleted

## 2023-01-01 NOTE — Telephone Encounter (Signed)
Per 12/25/22 LOS - No follow up is needed

## 2023-01-29 ENCOUNTER — Encounter: Payer: Self-pay | Admitting: Hematology & Oncology

## 2023-01-29 ENCOUNTER — Other Ambulatory Visit: Payer: Self-pay | Admitting: Hematology & Oncology

## 2023-01-29 DIAGNOSIS — C541 Malignant neoplasm of endometrium: Secondary | ICD-10-CM

## 2023-01-30 ENCOUNTER — Other Ambulatory Visit: Payer: Self-pay

## 2023-01-30 NOTE — Progress Notes (Signed)
Pharmacist Chemotherapy Monitoring - Initial Assessment    Anticipated start date: 02/02/23   The following has been reviewed per standard work regarding the patient's treatment regimen: The patient's diagnosis, treatment plan and drug doses, and organ/hematologic function Lab orders and baseline tests specific to treatment regimen  The treatment plan start date, drug sequencing, and pre-medications Prior authorization status  Patient's documented medication list, including drug-drug interaction screen and prescriptions for anti-emetics and supportive care specific to the treatment regimen The drug concentrations, fluid compatibility, administration routes, and timing of the medications to be used The patient's access for treatment and lifetime cumulative dose history, if applicable  The patient's medication allergies and previous infusion related reactions, if applicable   Changes made to treatment plan:  N/A  Follow up needed:  Pending authorization for treatment    Bayan Hedstrom, Nevin Bloodgood, [facility], 01/30/2023  7:30 AM

## 2023-02-01 ENCOUNTER — Other Ambulatory Visit: Payer: Self-pay

## 2023-02-02 ENCOUNTER — Other Ambulatory Visit: Payer: Self-pay

## 2023-02-02 ENCOUNTER — Inpatient Hospital Stay: Payer: Medicare Other

## 2023-02-02 ENCOUNTER — Encounter: Payer: Self-pay | Admitting: Hematology & Oncology

## 2023-02-02 ENCOUNTER — Inpatient Hospital Stay: Payer: Medicare Other | Admitting: Hematology & Oncology

## 2023-02-02 ENCOUNTER — Inpatient Hospital Stay: Payer: Medicare Other | Attending: Hematology & Oncology

## 2023-02-02 DIAGNOSIS — Z5111 Encounter for antineoplastic chemotherapy: Secondary | ICD-10-CM | POA: Insufficient documentation

## 2023-02-02 DIAGNOSIS — C541 Malignant neoplasm of endometrium: Secondary | ICD-10-CM

## 2023-02-02 DIAGNOSIS — R5383 Other fatigue: Secondary | ICD-10-CM | POA: Diagnosis not present

## 2023-02-02 DIAGNOSIS — Z79899 Other long term (current) drug therapy: Secondary | ICD-10-CM | POA: Diagnosis not present

## 2023-02-02 DIAGNOSIS — Z8639 Personal history of other endocrine, nutritional and metabolic disease: Secondary | ICD-10-CM | POA: Diagnosis not present

## 2023-02-02 DIAGNOSIS — E559 Vitamin D deficiency, unspecified: Secondary | ICD-10-CM | POA: Diagnosis not present

## 2023-02-02 LAB — CMP (CANCER CENTER ONLY)
ALT: 19 U/L (ref 0–44)
AST: 17 U/L (ref 15–41)
Albumin: 4.2 g/dL (ref 3.5–5.0)
Alkaline Phosphatase: 53 U/L (ref 38–126)
Anion gap: 8 (ref 5–15)
BUN: 13 mg/dL (ref 8–23)
CO2: 28 mmol/L (ref 22–32)
Calcium: 9.3 mg/dL (ref 8.9–10.3)
Chloride: 106 mmol/L (ref 98–111)
Creatinine: 0.73 mg/dL (ref 0.44–1.00)
GFR, Estimated: >60 mL/min (ref >60)
Glucose, Bld: 92 mg/dL (ref 70–99)
Potassium: 4 mmol/L (ref 3.5–5.1)
Sodium: 142 mmol/L (ref 135–145)
Total Bilirubin: 1 mg/dL (ref 0.3–1.2)
Total Protein: 6 g/dL — ABNORMAL LOW (ref 6.5–8.1)

## 2023-02-02 LAB — CBC WITH DIFFERENTIAL (CANCER CENTER ONLY)
Abs Immature Granulocytes: 0.01 10*3/uL (ref 0.00–0.07)
Basophils Absolute: 0 10*3/uL (ref 0.0–0.1)
Basophils Relative: 0 %
Eosinophils Absolute: 1.5 10*3/uL — ABNORMAL HIGH (ref 0.0–0.5)
Eosinophils Relative: 30 %
HCT: 32.3 % — ABNORMAL LOW (ref 36.0–46.0)
Hemoglobin: 10.4 g/dL — ABNORMAL LOW (ref 12.0–15.0)
Immature Granulocytes: 0 %
Lymphocytes Relative: 21 %
Lymphs Abs: 1 10*3/uL (ref 0.7–4.0)
MCH: 31.8 pg (ref 26.0–34.0)
MCHC: 32.2 g/dL (ref 30.0–36.0)
MCV: 98.8 fL (ref 80.0–100.0)
Monocytes Absolute: 0.4 10*3/uL (ref 0.1–1.0)
Monocytes Relative: 8 %
Neutro Abs: 2 10*3/uL (ref 1.7–7.7)
Neutrophils Relative %: 41 %
Platelet Count: 215 10*3/uL (ref 150–400)
RBC: 3.27 MIL/uL — ABNORMAL LOW (ref 3.87–5.11)
RDW: 13.4 % (ref 11.5–15.5)
WBC Count: 4.9 10*3/uL (ref 4.0–10.5)
nRBC: 0 % (ref 0.0–0.2)

## 2023-02-02 LAB — D-DIMER, QUANTITATIVE: D-Dimer, Quant: <0.27 ug{FEU}/mL (ref 0.00–0.50)

## 2023-02-02 LAB — TSH: TSH: 0.147 u[IU]/mL — ABNORMAL LOW (ref 0.350–4.500)

## 2023-02-02 MED ORDER — SODIUM CHLORIDE 0.9 % IV SOLN
400.0000 mg | Freq: Once | INTRAVENOUS | Status: AC
  Administered 2023-02-02: 400 mg via INTRAVENOUS
  Filled 2023-02-02: qty 16

## 2023-02-02 MED ORDER — SODIUM CHLORIDE 0.9 % IV SOLN: Freq: Once | INTRAVENOUS | Status: AC

## 2023-02-02 MED ORDER — SODIUM CHLORIDE 0.9% FLUSH
10.0000 mL | INTRAVENOUS | Status: DC | PRN
  Administered 2023-02-02: 10 mL

## 2023-02-02 MED ORDER — LIDOCAINE-PRILOCAINE 2.5-2.5 % EX CREA
TOPICAL_CREAM | CUTANEOUS | 3 refills | Status: DC
Start: 2023-02-02 — End: 2023-03-23

## 2023-02-02 MED ORDER — ONDANSETRON HCL 8 MG PO TABS: 8.0000 mg | ORAL_TABLET | Freq: Three times a day (TID) | ORAL | 1 refills | Status: AC | PRN

## 2023-02-02 MED ORDER — HEPARIN SOD (PORK) LOCK FLUSH 100 UNIT/ML IV SOLN
500.0000 [IU] | Freq: Once | INTRAVENOUS | Status: AC | PRN
  Administered 2023-02-02: 500 [IU]

## 2023-02-02 MED ORDER — PROCHLORPERAZINE MALEATE 10 MG PO TABS: 10.0000 mg | ORAL_TABLET | Freq: Four times a day (QID) | ORAL | 1 refills | Status: DC | PRN

## 2023-02-02 NOTE — Progress Notes (Signed)
Hematology and Oncology Follow Up Visit  Sherry Williams [id number] April 07, 1961 62 y.o. 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  Overall, she is doing pretty well.  The main problem that she has that she has some hoarseness.  This seems to be intermittent but yet present.  I am not sure as to why she would have the hoarseness.  There is no cough.  She has no problem swallowing.  There is no dysphagia or odynophagia.  She has completed all of her chemotherapy.  She completed 5 cycles about 6 weeks or so ago.  She still has a little bit of nausea.  I told her that she could take the olanzapine and that, even though it is at nighttime, she should be able to have it worked during the daytime.  The big news that a daughter is getting married on September 21.  I know that she will be ready for the wedding.  This is in 1 month now.  Of note, her last CA 125 was down to 11.  She has had no problems with bowels or bladder.  She is on Arixtra.  She has had no headache.  There is been no bleeding.  Overall, I would have said that her performance status is probably ECOG 1.     Medications:  Current Outpatient Medications:    ARMOUR THYROID 90 MG tablet, Take 90 mg by mouth every morning., Disp: , Rfl:    aspirin EC (ASPIR-LOW) 81 MG tablet, Take 81 mg by mouth daily., Disp: , Rfl:    cimetidine (TAGAMET) 400 MG tablet, Take 400 mg by mouth 2 (two) times daily., Disp: , Rfl:    dipyridamole (PERSANTINE) 75 MG tablet, Take 75 mg by mouth 2 (two) times daily.,  Disp: , Rfl:    doxycycline (MONODOX) 100 MG capsule, Take 100 mg by mouth 2 (two) times daily., Disp: , Rfl:    doxycycline (VIBRAMYCIN) 100 MG capsule, Take 100 mg by mouth daily. 08/31/2022 Takes for 30 days/off 30 days., Disp: , Rfl:    fondaparinux (ARIXTRA) 7.5 MG/0.6ML SOLN injection, Inject 0.6 mLs (7.5 mg total) into the skin daily., Disp: 18 mL, Rfl: 4   lidocaine-prilocaine (EMLA) cream, APPLY TOPICALLY TO THE AFFECTED AREA 1 TIME, Disp: 30 g, Rfl: 4   lovastatin (MEVACOR) 20 MG tablet, Take 20 mg by mouth at bedtime., Disp: , Rfl:    metFORMIN (GLUCOPHAGE) 500 MG tablet, Take 500 mg by mouth 2 (two) times daily., Disp: , Rfl:    Naltrexone HCl, Pain, (NALTREX) 4.5 MG CAPS, Take by mouth at bedtime., Disp: , Rfl:    OLANZapine (ZYPREXA) 10 MG tablet, Take 1 tablet (10 mg total) by mouth at bedtime., Disp: 30 tablet, Rfl: 6   Probiotic Product (PROBIOTIC BLEND PO), Take by mouth daily., Disp: , Rfl:    triamcinolone ointment (KENALOG) 0.5 %, Apply 1 Application topically 2 (two) times daily., Disp: 30 g, Rfl: 0  Allergies:  Allergies  Allergen Reactions   Bee Venom Anaphylaxis, Swelling and Other (  See Comments)   Aspartame Diarrhea   Aspartame And Phenylalanine Diarrhea   Adhesive [Tape] Other (See Comments)    Irritation and red    Past Medical History, Surgical history, Social history, and Family History were reviewed and updated.  Review of Systems: Review of Systems  Constitutional: Negative.   HENT: Negative.    Eyes: Negative.   Respiratory: Negative.    Cardiovascular: Negative.   Gastrointestinal:  Positive for abdominal pain.  Genitourinary: Negative.   Musculoskeletal: Negative.   Skin: Negative.   Neurological: Negative.   Endo/Heme/Allergies: Negative.   Psychiatric/Behavioral: Negative.      Physical Exam: Her vital signs show temperature of 98.  Pulse 72.  Blood pressure 105/51.  Weight is 124 pounds.      Wt Readings from Last 3 Encounters:   12/15/22 124 lb (56.2 kg)  11/24/22 122 lb 1.9 oz (55.4 kg)  11/10/22 122 lb (55.3 kg)    Physical Exam Vitals reviewed.  HENT:     Head: Normocephalic and atraumatic.  Eyes:     Pupils: Pupils are equal, round, and reactive to light.  Cardiovascular:     Rate and Rhythm: Normal rate and regular rhythm.     Heart sounds: Normal heart sounds.  Pulmonary:     Effort: Pulmonary effort is normal.     Breath sounds: Normal breath sounds.  Abdominal:     General: Bowel sounds are normal.     Palpations: Abdomen is soft.     Comments: Abdominal exam shows a soft abdomen.  She has well healing laparoscopic scars.  I think there are 5 laparoscopic scars.  She has no erythema or exudate or warmth associated with these.  She has no fluid wave in the abdomen.  There is no guarding or rebound tenderness.  She has no palpable liver or spleen tip.  Musculoskeletal:        General: No tenderness or deformity. Normal range of motion.     Cervical back: Normal range of motion.  Lymphadenopathy:     Cervical: No cervical adenopathy.  Skin:    General: Skin is warm and dry.     Findings: No erythema or rash.  Neurological:     Mental Status: She is alert and oriented to person, place, and time.  Psychiatric:        Behavior: Behavior normal.        Thought Content: Thought content normal.        Judgment: Judgment normal.     Lab Results  Component Value Date   WBC 2.2 (L) 12/22/2022   HGB 11.1 (L) 12/22/2022   HCT 34.2 (L) 12/22/2022   MCV 97.7 12/22/2022   PLT 319 12/22/2022     Chemistry      Component Value Date/Time   NA 138 12/22/2022 0845   NA 142 02/15/2017 1518   NA 140 04/13/2016 1055   K 4.0 12/22/2022 0845   K 4.1 02/15/2017 1518   K 4.1 04/13/2016 1055   CL 103 12/22/2022 0845   CL 107 02/15/2017 1518   CO2 28 12/22/2022 0845   CO2 29 02/15/2017 1518   CO2 27 04/13/2016 1055   BUN 13 12/22/2022 0845   BUN 17 02/15/2017 1518   BUN 22.1 04/13/2016 1055    CREATININE 0.75 12/22/2022 0845   CREATININE 1.0 02/15/2017 1518   CREATININE 0.8 04/13/2016 1055      Component Value Date/Time   CALCIUM 9.0 12/22/2022 0845   CALCIUM 9.3 02/15/2017  1518   CALCIUM 9.9 04/13/2016 1055   ALKPHOS 69 12/22/2022 0845   ALKPHOS 79 02/15/2017 1518   ALKPHOS 81 04/13/2016 1055   AST 15 12/22/2022 0845   AST 20 04/13/2016 1055   ALT 19 12/22/2022 0845   ALT 24 02/15/2017 1518   ALT 22 04/13/2016 1055   BILITOT 0.4 12/22/2022 0845   BILITOT 0.44 04/13/2016 1055      Impression and Plan: Sherry Williams is a 62 year old Caucasian female.  She has another recurrence of her gynecologic malignancy.  We will set her up with another PET scan in a couple weeks.  Her last 1 was done 3 months ago.  Again, I am not sure why she has this hoarseness.  If this continues, then we will likely need to get ENT involved.  We will see what the PET scan has to show.  I would have to believe that the PET scan will not show any active disease.  Her CA-125 is usually a good marker for [place].  I know that she will have a wonderful wedding for her daughter.  We will plan to get her back after they get back from vacation after the wedding.   Josph Macho, MD 8/23/20248:12 AM

## 2023-02-02 NOTE — Patient Instructions (Signed)
[facility]  Discharge Instructions: Thank you for choosing [facility] to provide your oncology and hematology care.   If you have a lab appointment with the Cancer Center, please go directly to the Cancer Center and check in at the registration area.  Wear comfortable clothing and clothing appropriate for easy access to any Portacath or PICC line.   We strive to give you quality time with your provider. You may need to reschedule your appointment if you arrive late (15 or more minutes).  Arriving late affects you and other patients whose appointments are after yours.  Also, if you miss three or more appointments without notifying the office, you may be dismissed from the clinic at the provider's discretion.      For prescription refill requests, have your pharmacy contact our office and allow 72 hours for refills to be completed.    Today you received the following chemotherapy and/or immunotherapy agents keytruda      To help prevent nausea and vomiting after your treatment, we encourage you to take your nausea medication as directed.  BELOW ARE SYMPTOMS THAT SHOULD BE REPORTED IMMEDIATELY: *FEVER GREATER THAN 100.4 F (38 C) OR HIGHER *CHILLS OR SWEATING *NAUSEA AND VOMITING THAT IS NOT CONTROLLED WITH YOUR NAUSEA MEDICATION *UNUSUAL SHORTNESS OF BREATH *UNUSUAL BRUISING OR BLEEDING *URINARY PROBLEMS (pain or burning when urinating, or frequent urination) *BOWEL PROBLEMS (unusual diarrhea, constipation, pain near the anus) TENDERNESS IN MOUTH AND THROAT WITH OR WITHOUT PRESENCE OF ULCERS (sore throat, sores in mouth, or a toothache) UNUSUAL RASH, SWELLING OR PAIN  UNUSUAL VAGINAL DISCHARGE OR ITCHING   Items with * indicate a potential emergency and should be followed up as soon as possible or go to the Emergency Department if any problems should occur.  Please show the CHEMOTHERAPY ALERT CARD or IMMUNOTHERAPY ALERT CARD at  check-in to the Emergency Department and triage nurse. Should you have questions after your visit or need to cancel or reschedule your appointment, please contact [facility]  [phone] and follow the prompts.  Office hours are 8:00 a.m. to 4:30 p.m. Monday - Friday. Please note that voicemails left after 4:00 p.m. may not be returned until the following business day.  We are closed weekends and major holidays. You have access to a nurse at all times for urgent questions. Please call the main number to the clinic [phone] and follow the prompts.  For any non-urgent questions, you may also contact your provider using MyChart. We now offer e-Visits for anyone 18 and older to request care online for non-urgent symptoms. For details visit [email or web address].   Also download the MyChart app! Go to the app store, search "MyChart", open the app, select [facility], and log in with your MyChart username and password.   

## 2023-02-03 LAB — CA 125: Cancer Antigen (CA) 125: 8.5 U/mL (ref 0.0–38.1)

## 2023-02-03 LAB — T4: T4, Total: 7.2 ug/dL (ref 4.5–12.0)

## 2023-02-04 ENCOUNTER — Other Ambulatory Visit: Payer: Self-pay

## 2023-02-09 ENCOUNTER — Encounter: Payer: Self-pay | Admitting: *Deleted

## 2023-02-14 ENCOUNTER — Encounter: Payer: Self-pay | Admitting: Hematology & Oncology

## 2023-02-15 ENCOUNTER — Other Ambulatory Visit: Payer: Self-pay | Admitting: Hematology & Oncology

## 2023-02-15 DIAGNOSIS — C541 Malignant neoplasm of endometrium: Secondary | ICD-10-CM

## 2023-02-16 ENCOUNTER — Encounter (HOSPITAL_COMMUNITY): Payer: Self-pay

## 2023-02-16 ENCOUNTER — Encounter (HOSPITAL_COMMUNITY): Payer: Medicare Other

## 2023-02-25 ENCOUNTER — Other Ambulatory Visit: Payer: Self-pay | Admitting: Hematology & Oncology

## 2023-02-26 ENCOUNTER — Encounter: Payer: Self-pay | Admitting: Hematology & Oncology

## 2023-02-27 ENCOUNTER — Other Ambulatory Visit: Payer: Self-pay

## 2023-03-02 ENCOUNTER — Encounter: Payer: Self-pay | Admitting: *Deleted

## 2023-03-09 ENCOUNTER — Encounter: Payer: Self-pay | Admitting: *Deleted

## 2023-03-09 ENCOUNTER — Ambulatory Visit (HOSPITAL_COMMUNITY)
Admission: RE | Admit: 2023-03-09 | Discharge: 2023-03-09 | Disposition: A | Discharge status: Home / Self Care | Payer: Medicare Other | Source: Ambulatory Visit | Attending: Hematology & Oncology | Admitting: Hematology & Oncology

## 2023-03-09 DIAGNOSIS — N3289 Other specified disorders of bladder: Secondary | ICD-10-CM | POA: Diagnosis not present

## 2023-03-09 DIAGNOSIS — C541 Malignant neoplasm of endometrium: Secondary | ICD-10-CM | POA: Insufficient documentation

## 2023-03-09 DIAGNOSIS — R918 Other nonspecific abnormal finding of lung field: Secondary | ICD-10-CM | POA: Diagnosis not present

## 2023-03-09 MED ORDER — HEPARIN SOD (PORK) LOCK FLUSH 100 UNIT/ML IV SOLN
INTRAVENOUS | Status: AC
  Filled 2023-03-09: qty 5

## 2023-03-09 MED ORDER — HEPARIN SOD (PORK) LOCK FLUSH 100 UNIT/ML IV SOLN
500.0000 [IU] | Freq: Once | INTRAVENOUS | Status: AC
  Administered 2023-03-09: 500 [IU] via INTRAVENOUS

## 2023-03-09 MED ORDER — IOHEXOL 300 MG/ML  SOLN
100.0000 mL | Freq: Once | INTRAMUSCULAR | Status: AC | PRN
  Administered 2023-03-09: 100 mL via INTRAVENOUS

## 2023-03-16 ENCOUNTER — Inpatient Hospital Stay: Payer: Medicare Other

## 2023-03-16 ENCOUNTER — Other Ambulatory Visit: Payer: Medicare Other

## 2023-03-16 ENCOUNTER — Ambulatory Visit: Payer: Medicare Other | Admitting: Hematology & Oncology

## 2023-03-16 ENCOUNTER — Ambulatory Visit: Payer: Medicare Other

## 2023-03-23 ENCOUNTER — Inpatient Hospital Stay: Payer: Medicare Other

## 2023-03-23 ENCOUNTER — Inpatient Hospital Stay: Payer: Medicare Other | Attending: Hematology & Oncology

## 2023-03-23 ENCOUNTER — Encounter: Payer: Self-pay | Admitting: Hematology & Oncology

## 2023-03-23 ENCOUNTER — Inpatient Hospital Stay: Payer: Medicare Other | Admitting: Hematology & Oncology

## 2023-03-23 VITALS — BP 104/55 | HR 51 | Resp 18

## 2023-03-23 DIAGNOSIS — Z79899 Other long term (current) drug therapy: Secondary | ICD-10-CM | POA: Diagnosis not present

## 2023-03-23 DIAGNOSIS — C541 Malignant neoplasm of endometrium: Secondary | ICD-10-CM | POA: Diagnosis not present

## 2023-03-23 DIAGNOSIS — Z5111 Encounter for antineoplastic chemotherapy: Secondary | ICD-10-CM | POA: Insufficient documentation

## 2023-03-23 LAB — LACTATE DEHYDROGENASE: LDH: 132 U/L (ref 98–192)

## 2023-03-23 LAB — CBC WITH DIFFERENTIAL (CANCER CENTER ONLY)
Abs Immature Granulocytes: 0.01 10*3/uL (ref 0.00–0.07)
Basophils Absolute: 0 10*3/uL (ref 0.0–0.1)
Basophils Relative: 0 %
Eosinophils Absolute: 0.2 10*3/uL (ref 0.0–0.5)
Eosinophils Relative: 9 %
HCT: 32.1 % — ABNORMAL LOW (ref 36.0–46.0)
Hemoglobin: 10.4 g/dL — ABNORMAL LOW (ref 12.0–15.0)
Immature Granulocytes: 0 %
Lymphocytes Relative: 29 %
Lymphs Abs: 0.7 10*3/uL (ref 0.7–4.0)
MCH: 32.5 pg (ref 26.0–34.0)
MCHC: 32.4 g/dL (ref 30.0–36.0)
MCV: 100.3 fL — ABNORMAL HIGH (ref 80.0–100.0)
Monocytes Absolute: 0.3 10*3/uL (ref 0.1–1.0)
Monocytes Relative: 13 %
Neutro Abs: 1.2 10*3/uL — ABNORMAL LOW (ref 1.7–7.7)
Neutrophils Relative %: 49 %
Platelet Count: 215 10*3/uL (ref 150–400)
RBC: 3.2 MIL/uL — ABNORMAL LOW (ref 3.87–5.11)
RDW: 11.8 % (ref 11.5–15.5)
WBC Count: 2.5 10*3/uL — ABNORMAL LOW (ref 4.0–10.5)
nRBC: 0 % (ref 0.0–0.2)

## 2023-03-23 LAB — CMP (CANCER CENTER ONLY)
ALT: 18 U/L (ref 0–44)
AST: 15 U/L (ref 15–41)
Albumin: 4 g/dL (ref 3.5–5.0)
Alkaline Phosphatase: 55 U/L (ref 38–126)
Anion gap: 8 (ref 5–15)
BUN: 18 mg/dL (ref 8–23)
CO2: 28 mmol/L (ref 22–32)
Calcium: 8.9 mg/dL (ref 8.9–10.3)
Chloride: 105 mmol/L (ref 98–111)
Creatinine: 0.68 mg/dL (ref 0.44–1.00)
GFR, Estimated: >60 mL/min (ref >60)
Glucose, Bld: 86 mg/dL (ref 70–99)
Potassium: 4.2 mmol/L (ref 3.5–5.1)
Sodium: 141 mmol/L (ref 135–145)
Total Bilirubin: 0.8 mg/dL (ref 0.3–1.2)
Total Protein: 6 g/dL — ABNORMAL LOW (ref 6.5–8.1)

## 2023-03-23 LAB — TSH: TSH: 0.034 u[IU]/mL — ABNORMAL LOW (ref 0.350–4.500)

## 2023-03-23 LAB — D-DIMER, QUANTITATIVE: D-Dimer, Quant: <0.27 ug{FEU}/mL (ref 0.00–0.50)

## 2023-03-23 MED ORDER — SODIUM CHLORIDE 0.9 % IV SOLN: Freq: Once | INTRAVENOUS | Status: AC

## 2023-03-23 MED ORDER — SODIUM CHLORIDE 0.9% FLUSH
10.0000 mL | INTRAVENOUS | Status: DC | PRN
  Administered 2023-03-23: 10 mL

## 2023-03-23 MED ORDER — SODIUM CHLORIDE 0.9 % IV SOLN
400.0000 mg | Freq: Once | INTRAVENOUS | Status: AC
  Administered 2023-03-23: 400 mg via INTRAVENOUS
  Filled 2023-03-23: qty 16

## 2023-03-23 MED ORDER — HEPARIN SOD (PORK) LOCK FLUSH 100 UNIT/ML IV SOLN
500.0000 [IU] | Freq: Once | INTRAVENOUS | Status: AC | PRN
  Administered 2023-03-23: 500 [IU]

## 2023-03-23 NOTE — Progress Notes (Signed)
Hematology and Oncology Follow Up Visit  Sherry Williams [id number] 21-Sep-1960 62 y.o. 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  She had she is doing quite nicely.  I saw pictures of her daughters wedding.  It looks like they had a wonderful time.  We did do a CT scan on her.  Unfortunately, insurance would not let us do a PET scan.  The CT scan was done on 03/09/2023.  There is no evidence of any type of recurrent disease.  Her last CA 125 was down to 8.5.  She has had no problems with the [facility].  She does have little bit of hoarseness.  I do not know if this might be from the [facility].  She is still on the Arixtra.  She is on 7.5 mg subcu daily.  I probably keep her on this dose for right now.  She has had no change in bowel or bladder habits.  She has had no nausea or vomiting.  She has had no headache.  There has been no cough or shortness of breath.  As always, she is incredibly active.  She walks quite a bit.  Currently, I would say that her performance status is probably ECOG 0.  She has had no problems with bowels or bladder.  She is on Arixtra.  She has had no headache.  There is been no bleeding.  Of note, she had a Signatera test that was done on 02/01/2023.  There is no detectable circulating tumor cells.  Overall, I would have said that her performance status is probably ECOG 1.     Medications:  Current Outpatient Medications:    ARMOUR THYROID 90 MG tablet, Take 90 mg by mouth every morning., Disp: , Rfl:    aspirin EC  (ASPIR-LOW) 81 MG tablet, Take 81 mg by mouth daily., Disp: , Rfl:    cimetidine (TAGAMET) 400 MG tablet, Take 400 mg by mouth 2 (two) times daily. 03/23/2023 Only takes M-F., Disp: , Rfl:    dipyridamole (PERSANTINE) 75 MG tablet, Take 75 mg by mouth 2 (two) times daily., Disp: , Rfl:    doxycycline (MONODOX) 100 MG capsule, Take 100 mg by mouth 2 (two) times daily., Disp: , Rfl:    fondaparinux (ARIXTRA) 7.5 MG/0.6ML SOLN injection, INJECT 0.6 MLS INTO THE SKIN DAILY, Disp: 18 mL, Rfl: 4   lidocaine-prilocaine (EMLA) cream, APPLY TOPICALLY TO THE AFFECTED AREA 1 TIME, Disp: 30 g, Rfl: 4   lovastatin (MEVACOR) 20 MG tablet, Take 20 mg by mouth at bedtime., Disp: , Rfl:    metFORMIN (GLUCOPHAGE) 500 MG tablet, Take 500 mg by mouth 2 (two) times daily., Disp: , Rfl:    Naltrexone HCl, Pain, (NALTREX) 4.5 MG CAPS, Take by mouth at bedtime., Disp: , Rfl:    ondansetron (ZOFRAN) 8 MG tablet, Take 1 tablet (8 mg total) by mouth every 8 (eight) hours as needed for nausea or vomiting., Disp: 30 tablet, Rfl: 1   Probiotic Product (PROBIOTIC BLEND PO), Take by mouth daily., Disp: ,  Rfl:    OLANZapine (ZYPREXA) 10 MG tablet, Take 1 tablet (10 mg total) by mouth at bedtime. (Patient not taking: Reported on 03/23/2023), Disp: 30 tablet, Rfl: 6   prochlorperazine (COMPAZINE) 10 MG tablet, Take 1 tablet (10 mg total) by mouth every 6 (six) hours as needed for nausea or vomiting. (Patient not taking: Reported on 03/23/2023), Disp: 30 tablet, Rfl: 1   triamcinolone ointment (KENALOG) 0.5 %, Apply 1 Application topically 2 (two) times daily. (Patient not taking: Reported on 03/23/2023), Disp: 30 g, Rfl: 0  Allergies:  Allergies  Allergen Reactions   Bee Venom Anaphylaxis, Swelling and Other (See Comments)   Aspartame Diarrhea   Aspartame And Phenylalanine Diarrhea   Adhesive [Tape] Other (See Comments)    Irritation and red    Past Medical History, Surgical history, Social history, and Family History were  reviewed and updated.  Review of Systems: Review of Systems  Constitutional: Negative.   HENT: Negative.    Eyes: Negative.   Respiratory: Negative.    Cardiovascular: Negative.   Gastrointestinal:  Positive for abdominal pain.  Genitourinary: Negative.   Musculoskeletal: Negative.   Skin: Negative.   Neurological: Negative.   Endo/Heme/Allergies: Negative.   Psychiatric/Behavioral: Negative.      Physical Exam: Her vital signs show temperature of 98.1.  Pulse 80.  Blood pressure 113/53.  Weight is 124 pounds.    Wt Readings from Last 3 Encounters:  03/23/23 124 lb (56.2 kg)  02/02/23 124 lb (56.2 kg)  12/15/22 124 lb (56.2 kg)    Physical Exam Vitals reviewed.  HENT:     Head: Normocephalic and atraumatic.  Eyes:     Pupils: Pupils are equal, round, and reactive to light.  Cardiovascular:     Rate and Rhythm: Normal rate and regular rhythm.     Heart sounds: Normal heart sounds.  Pulmonary:     Effort: Pulmonary effort is normal.     Breath sounds: Normal breath sounds.  Abdominal:     General: Bowel sounds are normal.     Palpations: Abdomen is soft.     Comments: Abdominal exam shows a soft abdomen.  She has well healing laparoscopic scars.  I think there are 5 laparoscopic scars.  She has no erythema or exudate or warmth associated with these.  She has no fluid wave in the abdomen.  There is no guarding or rebound tenderness.  She has no palpable liver or spleen tip.  Musculoskeletal:        General: No tenderness or deformity. Normal range of motion.     Cervical back: Normal range of motion.  Lymphadenopathy:     Cervical: No cervical adenopathy.  Skin:    General: Skin is warm and dry.     Findings: No erythema or rash.  Neurological:     Mental Status: She is alert and oriented to person, place, and time.  Psychiatric:        Behavior: Behavior normal.        Thought Content: Thought content normal.        Judgment: Judgment normal.     Lab Results   Component Value Date   WBC 2.5 (L) 03/23/2023   HGB 10.4 (L) 03/23/2023   HCT 32.1 (L) 03/23/2023   MCV 100.3 (H) 03/23/2023   PLT 215 03/23/2023     Chemistry      Component Value Date/Time   NA 142 02/02/2023 0851   NA 142 02/15/2017 1518   NA 140 04/13/2016  1055   K 4.0 02/02/2023 0851   K 4.1 02/15/2017 1518   K 4.1 04/13/2016 1055   CL 106 02/02/2023 0851   CL 107 02/15/2017 1518   CO2 28 02/02/2023 0851   CO2 29 02/15/2017 1518   CO2 27 04/13/2016 1055   BUN 13 02/02/2023 0851   BUN 17 02/15/2017 1518   BUN 22.1 04/13/2016 1055   CREATININE 0.73 02/02/2023 0851   CREATININE 1.0 02/15/2017 1518   CREATININE 0.8 04/13/2016 1055      Component Value Date/Time   CALCIUM 9.3 02/02/2023 0851   CALCIUM 9.3 02/15/2017 1518   CALCIUM 9.9 04/13/2016 1055   ALKPHOS 53 02/02/2023 0851   ALKPHOS 79 02/15/2017 1518   ALKPHOS 81 04/13/2016 1055   AST 17 02/02/2023 0851   AST 20 04/13/2016 1055   ALT 19 02/02/2023 0851   ALT 24 02/15/2017 1518   ALT 22 04/13/2016 1055   BILITOT 1.0 02/02/2023 0851   BILITOT 0.44 04/13/2016 1055      Impression and Plan: Sherry Williams is a 62 year old Caucasian female.  She has had another recurrence of her gynecologic malignancy.  We gave her chemotherapy.  She did incredibly well with chemotherapy.  She now is on maintenance immunotherapy.  I am just very happy that she is done so well.  Hopefully, her insurance will let us do a PET scan.  I probably would not do another PET scan until next year.  I am just happy that her quality of life is as good as it is.  She is incredibly proactive.  She has great support from her family.  She is truly an inspiration.  We will plan to get her back in 6 weeks.   Josph Macho, MD 10/11/20249:36 AM

## 2023-03-23 NOTE — Progress Notes (Signed)
OK to treat with ANC value from today per Dr. Ennever. 

## 2023-03-24 LAB — CA 125: Cancer Antigen (CA) 125: 8.4 U/mL (ref 0.0–38.1)

## 2023-03-24 LAB — T4: T4, Total: 7.6 ug/dL (ref 4.5–12.0)

## 2023-04-15 ENCOUNTER — Other Ambulatory Visit: Payer: Self-pay

## 2023-04-19 ENCOUNTER — Other Ambulatory Visit: Payer: Self-pay

## 2023-04-24 ENCOUNTER — Other Ambulatory Visit: Payer: Self-pay

## 2023-04-26 ENCOUNTER — Encounter: Payer: Self-pay | Admitting: Hematology & Oncology

## 2023-04-27 ENCOUNTER — Inpatient Hospital Stay: Payer: Medicare Other

## 2023-04-27 ENCOUNTER — Inpatient Hospital Stay (HOSPITAL_BASED_OUTPATIENT_CLINIC_OR_DEPARTMENT_OTHER): Payer: Medicare Other | Admitting: Hematology & Oncology

## 2023-04-27 ENCOUNTER — Inpatient Hospital Stay: Payer: Medicare Other | Attending: Hematology & Oncology

## 2023-04-27 ENCOUNTER — Encounter: Payer: Self-pay | Admitting: Hematology & Oncology

## 2023-04-27 VITALS — BP 110/71 | HR 69 | Temp 97.5°F | Resp 16 | Ht 66.0 in | Wt 127.0 lb

## 2023-04-27 VITALS — BP 96/60

## 2023-04-27 DIAGNOSIS — C541 Malignant neoplasm of endometrium: Secondary | ICD-10-CM

## 2023-04-27 DIAGNOSIS — Z8639 Personal history of other endocrine, nutritional and metabolic disease: Secondary | ICD-10-CM | POA: Diagnosis not present

## 2023-04-27 DIAGNOSIS — Z5112 Encounter for antineoplastic immunotherapy: Secondary | ICD-10-CM | POA: Diagnosis not present

## 2023-04-27 DIAGNOSIS — Z79899 Other long term (current) drug therapy: Secondary | ICD-10-CM | POA: Insufficient documentation

## 2023-04-27 DIAGNOSIS — Z5111 Encounter for antineoplastic chemotherapy: Secondary | ICD-10-CM | POA: Diagnosis present

## 2023-04-27 LAB — CMP (CANCER CENTER ONLY)
ALT: 12 U/L (ref 0–44)
AST: 15 U/L (ref 15–41)
Albumin: 4.4 g/dL (ref 3.5–5.0)
Alkaline Phosphatase: 62 U/L (ref 38–126)
Anion gap: 9 (ref 5–15)
BUN: 15 mg/dL (ref 8–23)
CO2: 27 mmol/L (ref 22–32)
Calcium: 9.3 mg/dL (ref 8.9–10.3)
Chloride: 104 mmol/L (ref 98–111)
Creatinine: 0.71 mg/dL (ref 0.44–1.00)
GFR, Estimated: >60 mL/min (ref >60)
Glucose, Bld: 111 mg/dL — ABNORMAL HIGH (ref 70–99)
Potassium: 3.9 mmol/L (ref 3.5–5.1)
Sodium: 140 mmol/L (ref 135–145)
Total Bilirubin: 0.6 mg/dL (ref <1.2)
Total Protein: 6 g/dL — ABNORMAL LOW (ref 6.5–8.1)

## 2023-04-27 LAB — CBC WITH DIFFERENTIAL (CANCER CENTER ONLY)
Abs Immature Granulocytes: 0.01 10*3/uL (ref 0.00–0.07)
Basophils Absolute: 0 10*3/uL (ref 0.0–0.1)
Basophils Relative: 0 %
Eosinophils Absolute: 0 10*3/uL (ref 0.0–0.5)
Eosinophils Relative: 0 %
HCT: 33.1 % — ABNORMAL LOW (ref 36.0–46.0)
Hemoglobin: 10.7 g/dL — ABNORMAL LOW (ref 12.0–15.0)
Immature Granulocytes: 0 %
Lymphocytes Relative: 32 %
Lymphs Abs: 0.9 10*3/uL (ref 0.7–4.0)
MCH: 31.9 pg (ref 26.0–34.0)
MCHC: 32.3 g/dL (ref 30.0–36.0)
MCV: 98.8 fL (ref 80.0–100.0)
Monocytes Absolute: 0.3 10*3/uL (ref 0.1–1.0)
Monocytes Relative: 11 %
Neutro Abs: 1.6 10*3/uL — ABNORMAL LOW (ref 1.7–7.7)
Neutrophils Relative %: 57 %
Platelet Count: 241 10*3/uL (ref 150–400)
RBC: 3.35 MIL/uL — ABNORMAL LOW (ref 3.87–5.11)
RDW: 12.6 % (ref 11.5–15.5)
WBC Count: 2.8 10*3/uL — ABNORMAL LOW (ref 4.0–10.5)
nRBC: 0 % (ref 0.0–0.2)

## 2023-04-27 LAB — TSH: TSH: 5.902 u[IU]/mL — ABNORMAL HIGH (ref 0.350–4.500)

## 2023-04-27 MED ORDER — SODIUM CHLORIDE 0.9 % IV SOLN: Freq: Once | INTRAVENOUS | Status: AC

## 2023-04-27 MED ORDER — SODIUM CHLORIDE 0.9% FLUSH
10.0000 mL | INTRAVENOUS | Status: DC | PRN
Start: 2023-04-27 — End: 2023-04-27
  Administered 2023-04-27: 10 mL

## 2023-04-27 MED ORDER — SODIUM CHLORIDE 0.9 % IV SOLN
400.0000 mg | Freq: Once | INTRAVENOUS | Status: AC
  Administered 2023-04-27: 400 mg via INTRAVENOUS
  Filled 2023-04-27: qty 16

## 2023-04-27 MED ORDER — HEPARIN SOD (PORK) LOCK FLUSH 100 UNIT/ML IV SOLN
500.0000 [IU] | Freq: Once | INTRAVENOUS | Status: AC | PRN
  Administered 2023-04-27: 500 [IU]

## 2023-04-27 NOTE — Progress Notes (Signed)
Ok to give Keytruda 1 week early per Dr Myna Hidalgo

## 2023-04-27 NOTE — Progress Notes (Signed)
Hematology and Oncology Follow Up Visit  Sherry Williams [id number] 1961/06/07 62 y.o. 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  She had she is doing quite nicely.  I must say that she really looks quite healthy.  She is trying to exercise.  As far as her cancer is concerned, everything has been in remission.  She had a recent CA125 which was I think stable at 8.4.   She has had no problems with cough or shortness of breath.  There is been no issues with the CVA.  She continues on Arixtra.  She has had no rashes.  There has been no issues with bowels or bladder.  She has had no bleeding.  She has had no fever.  She is had no issues with COVID.  Overall, I would say that her performance status is probably ECOG 0.   Medications:  Current Outpatient Medications:    aspirin EC (ASPIR-LOW) 81 MG tablet, Take 81 mg by mouth daily., Disp: , Rfl:    cimetidine (TAGAMET) 400 MG tablet, Take 400 mg by mouth 2 (two) times daily. 03/23/2023 Only takes M-F., Disp: , Rfl:    dipyridamole (PERSANTINE) 75 MG tablet, Take 75 mg by mouth 2 (two) times daily., Disp: , Rfl:    doxycycline (MONODOX) 100 MG capsule, Take 100 mg by mouth 2 (two) times daily., Disp: , Rfl:    fondaparinux (ARIXTRA) 7.5 MG/0.6ML SOLN injection, INJECT 0.6 MLS INTO THE SKIN DAILY, Disp: 18 mL, Rfl: 4   lidocaine-prilocaine (EMLA) cream, APPLY TOPICALLY TO THE AFFECTED AREA 1 TIME, Disp: 30 g, Rfl: 4   metFORMIN (GLUCOPHAGE) 500 MG tablet, Take 500 mg by mouth 2 (two) times daily., Disp:  , Rfl:    Naltrexone HCl, Pain, (NALTREX) 4.5 MG CAPS, Take by mouth at bedtime., Disp: , Rfl:    ondansetron (ZOFRAN) 8 MG tablet, Take 1 tablet (8 mg total) by mouth every 8 (eight) hours as needed for nausea or vomiting., Disp: 30 tablet, Rfl: 1   Probiotic Product (PROBIOTIC BLEND PO), Take by mouth daily., Disp: , Rfl:    OLANZapine (ZYPREXA) 10 MG tablet, Take 1 tablet (10 mg total) by mouth at bedtime. (Patient not taking: Reported on 03/23/2023), Disp: 30 tablet, Rfl: 6   prochlorperazine (COMPAZINE) 10 MG tablet, Take 1 tablet (10 mg total) by mouth every 6 (six) hours as needed for nausea or vomiting. (Patient not taking: Reported on 03/23/2023), Disp: 30 tablet, Rfl: 1   thyroid (ARMOUR) 60 MG tablet, Take 60 mg by mouth every morning., Disp: , Rfl:   Allergies:  Allergies  Allergen Reactions   Bee Venom Anaphylaxis, Swelling and Other (See Comments)   Aspartame Diarrhea   Aspartame And Phenylalanine Diarrhea   Adhesive [Tape] Other (See Comments)    Irritation and red    Past Medical History, Surgical history, Social history, and Family History were reviewed and updated.  Review of Systems: Review of Systems  Constitutional: Negative.   HENT: Negative.    Eyes: Negative.   Respiratory:  Negative.    Cardiovascular: Negative.   Gastrointestinal:  Positive for abdominal pain.  Genitourinary: Negative.   Musculoskeletal: Negative.   Skin: Negative.   Neurological: Negative.   Endo/Heme/Allergies: Negative.   Psychiatric/Behavioral: Negative.      Physical Exam: Her vital signs show temperature of 97.5.  Pulse 60.  Blood pressure 110/71.  Weight is down 27 pounds.    Wt Readings from Last 3 Encounters:  04/27/23 127 lb (57.6 kg)  03/23/23 124 lb (56.2 kg)  02/02/23 124 lb (56.2 kg)    Physical Exam Vitals reviewed.  HENT:     Head: Normocephalic and atraumatic.  Eyes:     Pupils: Pupils are equal, round, and reactive to light.  Cardiovascular:     Rate and  Rhythm: Normal rate and regular rhythm.     Heart sounds: Normal heart sounds.  Pulmonary:     Effort: Pulmonary effort is normal.     Breath sounds: Normal breath sounds.  Abdominal:     General: Bowel sounds are normal.     Palpations: Abdomen is soft.     Comments: Abdominal exam shows a soft abdomen.  She has well healing laparoscopic scars.  I think there are 5 laparoscopic scars.  She has no erythema or exudate or warmth associated with these.  She has no fluid wave in the abdomen.  There is no guarding or rebound tenderness.  She has no palpable liver or spleen tip.  Musculoskeletal:        General: No tenderness or deformity. Normal range of motion.     Cervical back: Normal range of motion.  Lymphadenopathy:     Cervical: No cervical adenopathy.  Skin:    General: Skin is warm and dry.     Findings: No erythema or rash.  Neurological:     Mental Status: She is alert and oriented to person, place, and time.  Psychiatric:        Behavior: Behavior normal.        Thought Content: Thought content normal.        Judgment: Judgment normal.     Lab Results  Component Value Date   WBC 2.8 (L) 04/27/2023   HGB 10.7 (L) 04/27/2023   HCT 33.1 (L) 04/27/2023   MCV 98.8 04/27/2023   PLT 241 04/27/2023     Chemistry      Component Value Date/Time   NA 141 03/23/2023 0905   NA 142 02/15/2017 1518   NA 140 04/13/2016 1055   K 4.2 03/23/2023 0905   K 4.1 02/15/2017 1518   K 4.1 04/13/2016 1055   CL 105 03/23/2023 0905   CL 107 02/15/2017 1518   CO2 28 03/23/2023 0905   CO2 29 02/15/2017 1518   CO2 27 04/13/2016 1055   BUN 18 03/23/2023 0905   BUN 17 02/15/2017 1518   BUN 22.1 04/13/2016 1055   CREATININE 0.68 03/23/2023 0905   CREATININE 1.0 02/15/2017 1518   CREATININE 0.8 04/13/2016 1055      Component Value Date/Time   CALCIUM 8.9 03/23/2023 0905   CALCIUM 9.3 02/15/2017 1518   CALCIUM 9.9 04/13/2016 1055   ALKPHOS 55 03/23/2023 0905   ALKPHOS 79 02/15/2017 1518    ALKPHOS 81 04/13/2016 1055   AST 15 03/23/2023 0905   AST 20 04/13/2016 1055   ALT 18 03/23/2023 0905   ALT 24 02/15/2017 1518   ALT 22 04/13/2016 1055   BILITOT 0.8 03/23/2023 0905   BILITOT 0.44 04/13/2016 1055  Impression and Plan: Sherry Williams is a 62 year old Caucasian female.  She has had another recurrence of her gynecologic malignancy.  We gave her chemotherapy.  She did incredibly well with chemotherapy.  She now is on maintenance immunotherapy.  I am just very happy that she is done so well.  Hopefully, her insurance will let us do a PET scan.  I probably would ordered the PET scan at the end of this year.  We will try to get her back to see [place] after the Holiday season.  Josph Macho, MD 11/15/20249:37 AM

## 2023-04-27 NOTE — Patient Instructions (Signed)

## 2023-04-27 NOTE — Patient Instructions (Signed)
[facility] - A DEPT OF [facility]  Discharge Instructions: Thank you for choosing [facility] to provide your oncology and hematology care.   If you have a lab appointment with the Cancer Center, please go directly to the Cancer Center and check in at the registration area.  Wear comfortable clothing and clothing appropriate for easy access to any Portacath or PICC line.   We strive to give you quality time with your provider. You may need to reschedule your appointment if you arrive late (15 or more minutes).  Arriving late affects you and other patients whose appointments are after yours.  Also, if you miss three or more appointments without notifying the office, you may be dismissed from the clinic at the provider's discretion.      For prescription refill requests, have your pharmacy contact our office and allow 72 hours for refills to be completed.    Today you received the following chemotherapy and/or immunotherapy agents pembrolizumab      To help prevent nausea and vomiting after your treatment, we encourage you to take your nausea medication as directed.  BELOW ARE SYMPTOMS THAT SHOULD BE REPORTED IMMEDIATELY: *FEVER GREATER THAN 100.4 F (38 C) OR HIGHER *CHILLS OR SWEATING *NAUSEA AND VOMITING THAT IS NOT CONTROLLED WITH YOUR NAUSEA MEDICATION *UNUSUAL SHORTNESS OF BREATH *UNUSUAL BRUISING OR BLEEDING *URINARY PROBLEMS (pain or burning when urinating, or frequent urination) *BOWEL PROBLEMS (unusual diarrhea, constipation, pain near the anus) TENDERNESS IN MOUTH AND THROAT WITH OR WITHOUT PRESENCE OF ULCERS (sore throat, sores in mouth, or a toothache) UNUSUAL RASH, SWELLING OR PAIN  UNUSUAL VAGINAL DISCHARGE OR ITCHING   Items with * indicate a potential emergency and should be followed up as soon as possible or go to the Emergency Department if any problems should occur.  Please show the CHEMOTHERAPY ALERT CARD or  IMMUNOTHERAPY ALERT CARD at check-in to the Emergency Department and triage nurse. Should you have questions after your visit or need to cancel or reschedule your appointment, please contact [facility] - A DEPT OF [facility]  [phone] and follow the prompts.  Office hours are 8:00 a.m. to 4:30 p.m. Monday - Friday. Please note that voicemails left after 4:00 p.m. may not be returned until the following business day.  We are closed weekends and major holidays. You have access to a nurse at all times for urgent questions. Please call the main number to the clinic [phone] and follow the prompts.  For any non-urgent questions, you may also contact your provider using MyChart. We now offer e-Visits for anyone 53 and older to request care online for non-urgent symptoms. For details visit [email or web address].   Also download the MyChart app! Go to the app store, search "MyChart", open the app, select [place], and log in with your MyChart username and password.

## 2023-04-29 ENCOUNTER — Other Ambulatory Visit: Payer: Self-pay

## 2023-04-29 LAB — T4: T4, Total: 4.7 ug/dL (ref 4.5–12.0)

## 2023-04-29 LAB — CA 125: Cancer Antigen (CA) 125: 7.3 U/mL (ref 0.0–38.1)

## 2023-04-30 ENCOUNTER — Other Ambulatory Visit: Payer: Self-pay

## 2023-04-30 ENCOUNTER — Encounter: Payer: Self-pay | Admitting: *Deleted

## 2023-04-30 DIAGNOSIS — C541 Malignant neoplasm of endometrium: Secondary | ICD-10-CM

## 2023-04-30 MED ORDER — LEVOTHYROXINE SODIUM 25 MCG PO TABS: 25.0000 ug | ORAL_TABLET | Freq: Every day | ORAL | 3 refills | Status: DC

## 2023-05-15 ENCOUNTER — Other Ambulatory Visit: Payer: Self-pay | Admitting: Hematology & Oncology

## 2023-05-16 ENCOUNTER — Encounter: Payer: Self-pay | Admitting: Hematology & Oncology

## 2023-05-18 ENCOUNTER — Other Ambulatory Visit: Payer: Self-pay

## 2023-06-08 ENCOUNTER — Other Ambulatory Visit: Payer: Self-pay

## 2023-06-08 ENCOUNTER — Inpatient Hospital Stay: Payer: Medicare Other

## 2023-06-08 ENCOUNTER — Encounter: Payer: Self-pay | Admitting: Hematology & Oncology

## 2023-06-08 ENCOUNTER — Inpatient Hospital Stay: Payer: Medicare Other | Attending: Hematology & Oncology

## 2023-06-08 ENCOUNTER — Inpatient Hospital Stay (HOSPITAL_BASED_OUTPATIENT_CLINIC_OR_DEPARTMENT_OTHER): Payer: Medicare Other | Admitting: Hematology & Oncology

## 2023-06-08 VITALS — BP 90/62 | HR 76 | Temp 98.1°F | Resp 18

## 2023-06-08 VITALS — BP 92/56 | HR 84 | Temp 98.1°F | Resp 18 | Ht 66.0 in | Wt 124.0 lb

## 2023-06-08 DIAGNOSIS — C541 Malignant neoplasm of endometrium: Secondary | ICD-10-CM

## 2023-06-08 DIAGNOSIS — E063 Autoimmune thyroiditis: Secondary | ICD-10-CM

## 2023-06-08 DIAGNOSIS — Z8639 Personal history of other endocrine, nutritional and metabolic disease: Secondary | ICD-10-CM | POA: Diagnosis not present

## 2023-06-08 DIAGNOSIS — Z5112 Encounter for antineoplastic immunotherapy: Secondary | ICD-10-CM | POA: Diagnosis not present

## 2023-06-08 DIAGNOSIS — Z79899 Other long term (current) drug therapy: Secondary | ICD-10-CM | POA: Diagnosis not present

## 2023-06-08 LAB — IRON AND IRON BINDING CAPACITY (CC-WL,HP ONLY)
Iron: 83 ug/dL (ref 28–170)
Saturation Ratios: 27 % (ref 10.4–31.8)
TIBC: 314 ug/dL (ref 250–450)
UIBC: 231 ug/dL (ref 148–442)

## 2023-06-08 LAB — CBC WITH DIFFERENTIAL (CANCER CENTER ONLY)
Abs Immature Granulocytes: 0 10*3/uL (ref 0.00–0.07)
Basophils Absolute: 0 10*3/uL (ref 0.0–0.1)
Basophils Relative: 1 %
Eosinophils Absolute: 0 10*3/uL (ref 0.0–0.5)
Eosinophils Relative: 0 %
HCT: 31.1 % — ABNORMAL LOW (ref 36.0–46.0)
Hemoglobin: 10.4 g/dL — ABNORMAL LOW (ref 12.0–15.0)
Immature Granulocytes: 0 %
Lymphocytes Relative: 32 %
Lymphs Abs: 0.9 10*3/uL (ref 0.7–4.0)
MCH: 33.4 pg (ref 26.0–34.0)
MCHC: 33.4 g/dL (ref 30.0–36.0)
MCV: 100 fL (ref 80.0–100.0)
Monocytes Absolute: 0.3 10*3/uL (ref 0.1–1.0)
Monocytes Relative: 11 %
Neutro Abs: 1.6 10*3/uL — ABNORMAL LOW (ref 1.7–7.7)
Neutrophils Relative %: 56 %
Platelet Count: 255 10*3/uL (ref 150–400)
RBC: 3.11 MIL/uL — ABNORMAL LOW (ref 3.87–5.11)
RDW: 13 % (ref 11.5–15.5)
WBC Count: 2.8 10*3/uL — ABNORMAL LOW (ref 4.0–10.5)
nRBC: 0 % (ref 0.0–0.2)

## 2023-06-08 LAB — CMP (CANCER CENTER ONLY)
ALT: 10 U/L (ref 0–44)
AST: 13 U/L — ABNORMAL LOW (ref 15–41)
Albumin: 4.3 g/dL (ref 3.5–5.0)
Alkaline Phosphatase: 74 U/L (ref 38–126)
Anion gap: 10 (ref 5–15)
BUN: 20 mg/dL (ref 8–23)
CO2: 27 mmol/L (ref 22–32)
Calcium: 9.2 mg/dL (ref 8.9–10.3)
Chloride: 104 mmol/L (ref 98–111)
Creatinine: 0.75 mg/dL (ref 0.44–1.00)
GFR, Estimated: >60 mL/min (ref >60)
Glucose, Bld: 103 mg/dL — ABNORMAL HIGH (ref 70–99)
Potassium: 4.2 mmol/L (ref 3.5–5.1)
Sodium: 141 mmol/L (ref 135–145)
Total Bilirubin: 0.8 mg/dL (ref <1.2)
Total Protein: 5.8 g/dL — ABNORMAL LOW (ref 6.5–8.1)

## 2023-06-08 LAB — FERRITIN: Ferritin: 35 ng/mL (ref 11–307)

## 2023-06-08 LAB — TSH: TSH: 0.596 u[IU]/mL (ref 0.350–4.500)

## 2023-06-08 MED ORDER — PEMBROLIZUMAB CHEMO INJECTION 100 MG/4ML
400.0000 mg | Freq: Once | INTRAVENOUS | Status: AC
  Administered 2023-06-08: 400 mg via INTRAVENOUS
  Filled 2023-06-08: qty 16

## 2023-06-08 MED ORDER — SODIUM CHLORIDE 0.9 % IV SOLN: Freq: Once | INTRAVENOUS | Status: AC

## 2023-06-08 MED ORDER — SODIUM CHLORIDE 0.9% FLUSH
10.0000 mL | INTRAVENOUS | Status: DC | PRN
Start: 2023-06-08 — End: 2023-06-08
  Administered 2023-06-08: 10 mL

## 2023-06-08 MED ORDER — HEPARIN SOD (PORK) LOCK FLUSH 100 UNIT/ML IV SOLN
500.0000 [IU] | Freq: Once | INTRAVENOUS | Status: AC | PRN
  Administered 2023-06-08: 500 [IU]

## 2023-06-08 NOTE — Progress Notes (Signed)
Hematology and Oncology Follow Up Visit  Sherry Williams [id number] [birth year] 62 y.o. 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  She had she is doing quite nicely.  I must say that she really looks quite healthy.  She really enjoyed the holiday season.  She had family over.  She has been quite busy.  She does feel a bit tired.  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  I suspect this probably still from the chemotherapy that she had.  We will go ahead and check her iron studies.  Her last CA-125 was down to 7.3.  She is due for a PET scan on 06/11/2023.  She has had no abdominal pain.  She has had no cough or shortness of breath.  She has had no headache.  There has been no rashes.  There is been no leg swelling.  She continues on the Arixtra.  She is doing well on the Arixtra.  She has had no issues with bleeding.  There is been no change in bowel or bladder habits.  She really has done incredibly well this year.  She had an incredible response to chemotherapy with immunotherapy.  We now have her on maintenance immunotherapy.  Currently, I would say performance status is probably ECOG 0.     Medications:  Current Outpatient Medications:    aspirin EC (ASPIR-LOW) 81 MG tablet, Take 81 mg by mouth daily., Disp: , Rfl:    cimetidine (TAGAMET) 400 MG tablet, Take 400 mg by mouth 2 (two) times daily. 03/23/2023 Only takes M-F., Disp: , Rfl:    dipyridamole (PERSANTINE) 75 MG tablet, Take 75 mg by mouth 2 (two) times daily., Disp: ,  Rfl:    doxycycline (MONODOX) 100 MG capsule, Take 100 mg by mouth 2 (two) times daily., Disp: , Rfl:    fondaparinux (ARIXTRA) 7.5 MG/0.6ML SOLN injection, INJECT 0.6 MLS INTO THE SKIN DAILY, Disp: 18 mL, Rfl: 4   lidocaine-prilocaine (EMLA) cream, APPLY TOPICALLY TO THE AFFECTED AREA 1 TIME, Disp: 30 g, Rfl: 4   metFORMIN (GLUCOPHAGE) 500 MG tablet, Take 500 mg by mouth 2 (two) times daily., Disp: , Rfl:    Naltrexone HCl, Pain, (NALTREX) 4.5 MG CAPS, Take by mouth at bedtime., Disp: , Rfl:    OLANZapine (ZYPREXA) 10 MG tablet, TAKE 1 TABLET BY MOUTH EVERY DAY, Disp: 30 tablet, Rfl: 6   ondansetron (ZOFRAN) 8 MG tablet, Take 1 tablet (8 mg total) by mouth every 8 (eight) hours as needed for nausea or vomiting., Disp: 30 tablet, Rfl: 1   Probiotic Product (PROBIOTIC BLEND PO), Take by mouth daily., Disp: , Rfl:    prochlorperazine (COMPAZINE) 10 MG tablet, Take 1 tablet (10 mg total) by mouth every 6 (six) hours as needed for nausea or vomiting. (Patient not taking: Reported on 03/23/2023), Disp: 30 tablet, Rfl: 1   thyroid (ARMOUR) 60 MG tablet, Take 60 mg by mouth every morning., Disp: , Rfl:   Allergies:  Allergies  Allergen Reactions   Bee Venom  Anaphylaxis, Swelling and Other (See Comments)   Aspartame Diarrhea   Aspartame And Phenylalanine Diarrhea   Adhesive [Tape] Other (See Comments)    Irritation and red    Past Medical History, Surgical history, Social history, and Family History were reviewed and updated.  Review of Systems: Review of Systems  Constitutional: Negative.   HENT: Negative.    Eyes: Negative.   Respiratory: Negative.    Cardiovascular: Negative.   Gastrointestinal:  Positive for abdominal pain.  Genitourinary: Negative.   Musculoskeletal: Negative.   Skin: Negative.   Neurological: Negative.   Endo/Heme/Allergies: Negative.   Psychiatric/Behavioral: Negative.      Physical Exam: Her vital signs show temperature of 98.1.  Pulse 84.  Blood pressure  92/56.  Weight is 124 pounds.   .    Wt Readings from Last 3 Encounters:  06/08/23 124 lb (56.2 kg)  04/27/23 127 lb (57.6 kg)  03/23/23 124 lb (56.2 kg)    Physical Exam Vitals reviewed.  HENT:     Head: Normocephalic and atraumatic.  Eyes:     Pupils: Pupils are equal, round, and reactive to light.  Cardiovascular:     Rate and Rhythm: Normal rate and regular rhythm.     Heart sounds: Normal heart sounds.  Pulmonary:     Effort: Pulmonary effort is normal.     Breath sounds: Normal breath sounds.  Abdominal:     General: Bowel sounds are normal.     Palpations: Abdomen is soft.     Comments: Abdominal exam shows a soft abdomen.  She has well healing laparoscopic scars.  I think there are 5 laparoscopic scars.  She has no erythema or exudate or warmth associated with these.  She has no fluid wave in the abdomen.  There is no guarding or rebound tenderness.  She has no palpable liver or spleen tip.  Musculoskeletal:        General: No tenderness or deformity. Normal range of motion.     Cervical back: Normal range of motion.  Lymphadenopathy:     Cervical: No cervical adenopathy.  Skin:    General: Skin is warm and dry.     Findings: No erythema or rash.  Neurological:     Mental Status: She is alert and oriented to person, place, and time.  Psychiatric:        Behavior: Behavior normal.        Thought Content: Thought content normal.        Judgment: Judgment normal.     Lab Results  Component Value Date   WBC 2.8 (L) 06/08/2023   HGB 10.4 (L) 06/08/2023   HCT 31.1 (L) 06/08/2023   MCV 100.0 06/08/2023   PLT 255 06/08/2023     Chemistry      Component Value Date/Time   NA 140 04/27/2023 0910   NA 142 02/15/2017 1518   NA 140 04/13/2016 1055   K 3.9 04/27/2023 0910   K 4.1 02/15/2017 1518   K 4.1 04/13/2016 1055   CL 104 04/27/2023 0910   CL 107 02/15/2017 1518   CO2 27 04/27/2023 0910   CO2 29 02/15/2017 1518   CO2 27 04/13/2016 1055   BUN 15  04/27/2023 0910   BUN 17 02/15/2017 1518   BUN 22.1 04/13/2016 1055   CREATININE 0.71 04/27/2023 0910   CREATININE 1.0 02/15/2017 1518   CREATININE 0.8 04/13/2016 1055      Component Value Date/Time   CALCIUM 9.3 04/27/2023 0910  CALCIUM 9.3 02/15/2017 1518   CALCIUM 9.9 04/13/2016 1055   ALKPHOS 62 04/27/2023 0910   ALKPHOS 79 02/15/2017 1518   ALKPHOS 81 04/13/2016 1055   AST 15 04/27/2023 0910   AST 20 04/13/2016 1055   ALT 12 04/27/2023 0910   ALT 24 02/15/2017 1518   ALT 22 04/13/2016 1055   BILITOT 0.6 04/27/2023 0910   BILITOT 0.44 04/13/2016 1055      Impression and Plan: Ms. Kunzler is a 62 year old Caucasian female.  She has had another recurrence of her gynecologic malignancy.  We gave her chemotherapy.  She did incredibly well with chemotherapy.  She now is on maintenance immunotherapy.  I am just very happy that she is done so well.  I will hope that the PET scan will not show any evidence of recurrent disease.  We will have to see about the leukopenia and anemia.  Again we will check her iron studies.  We will see what the reticulocyte count is.  As always, we treat her every 6 weeks.  We will get her back in 6 weeks, or sooner if there is a problem on the PET scan.  Josph Macho, MD 12/27/20249:09 AM

## 2023-06-08 NOTE — Patient Instructions (Signed)
 [facility] - A DEPT OF [facility]  Discharge Instructions: Thank you for choosing [facility] to provide your oncology and hematology care.   If you have a lab appointment with the Cancer Center, please go directly to the Cancer Center and check in at the registration area.  Wear comfortable clothing and clothing appropriate for easy access to any Portacath or PICC line.   We strive to give you quality time with your provider. You may need to reschedule your appointment if you arrive late (15 or more minutes).  Arriving late affects you and other patients whose appointments are after yours.  Also, if you miss three or more appointments without notifying the office, you may be dismissed from the clinic at the provider's discretion.      For prescription refill requests, have your pharmacy contact our office and allow 72 hours for refills to be completed.    Today you received the following chemotherapy and/or immunotherapy agents Keytruda      To help prevent nausea and vomiting after your treatment, we encourage you to take your nausea medication as directed.  BELOW ARE SYMPTOMS THAT SHOULD BE REPORTED IMMEDIATELY: *FEVER GREATER THAN 100.4 F (38 C) OR HIGHER *CHILLS OR SWEATING *NAUSEA AND VOMITING THAT IS NOT CONTROLLED WITH YOUR NAUSEA MEDICATION *UNUSUAL SHORTNESS OF BREATH *UNUSUAL BRUISING OR BLEEDING *URINARY PROBLEMS (pain or burning when urinating, or frequent urination) *BOWEL PROBLEMS (unusual diarrhea, constipation, pain near the anus) TENDERNESS IN MOUTH AND THROAT WITH OR WITHOUT PRESENCE OF ULCERS (sore throat, sores in mouth, or a toothache) UNUSUAL RASH, SWELLING OR PAIN  UNUSUAL VAGINAL DISCHARGE OR ITCHING   Items with * indicate a potential emergency and should be followed up as soon as possible or go to the Emergency Department if any problems should occur.  Please show the CHEMOTHERAPY ALERT CARD or IMMUNOTHERAPY  ALERT CARD at check-in to the Emergency Department and triage nurse. Should you have questions after your visit or need to cancel or reschedule your appointment, please contact [facility] - A DEPT OF [facility]  [phone] and follow the prompts.  Office hours are 8:00 a.m. to 4:30 p.m. Monday - Friday. Please note that voicemails left after 4:00 p.m. may not be returned until the following business day.  We are closed weekends and major holidays. You have access to a nurse at all times for urgent questions. Please call the main number to the clinic [phone] and follow the prompts.  For any non-urgent questions, you may also contact your provider using MyChart. We now offer e-Visits for anyone 83 and older to request care online for non-urgent symptoms. For details visit [email or web address].   Also download the MyChart app! Go to the app store, search "MyChart", open the app, select [place], and log in with your MyChart username and password.

## 2023-06-08 NOTE — Patient Instructions (Signed)

## 2023-06-09 LAB — CA 125: Cancer Antigen (CA) 125: 8.3 U/mL (ref 0.0–38.1)

## 2023-06-09 LAB — T4: T4, Total: 6.2 ug/dL (ref 4.5–12.0)

## 2023-06-10 ENCOUNTER — Other Ambulatory Visit: Payer: Self-pay

## 2023-06-11 ENCOUNTER — Ambulatory Visit (HOSPITAL_COMMUNITY)
Admission: RE | Admit: 2023-06-11 | Discharge: 2023-06-11 | Disposition: A | Discharge status: Home / Self Care | Payer: Medicare Other | Source: Ambulatory Visit | Attending: Hematology & Oncology | Admitting: Hematology & Oncology

## 2023-06-11 DIAGNOSIS — C541 Malignant neoplasm of endometrium: Secondary | ICD-10-CM | POA: Insufficient documentation

## 2023-06-11 LAB — GLUCOSE, CAPILLARY: Glucose-Capillary: 79 mg/dL (ref 70–99)

## 2023-06-11 MED ORDER — FLUDEOXYGLUCOSE F - 18 (FDG) INJECTION
6.2000 | Freq: Once | INTRAVENOUS | Status: AC
  Administered 2023-06-11: 6.2 via INTRAVENOUS

## 2023-06-25 ENCOUNTER — Encounter: Payer: Self-pay | Admitting: *Deleted

## 2023-06-25 NOTE — Progress Notes (Signed)
 Received prior authorization request for Patient's Fondaparinux  7.5 mg/0.6 ml Solution. Received response via CoverMyMeds that medication is on Patient's list of covered drugs and that no prior authorization is required. Called [employer] for verification and was given the same information that the medication does not require a prior authorization under Patient's plan. Provider's office notified. No other needs or concerns noted at this time.

## 2023-07-07 ENCOUNTER — Other Ambulatory Visit: Payer: Self-pay

## 2023-07-09 ENCOUNTER — Other Ambulatory Visit: Payer: Self-pay

## 2023-07-13 ENCOUNTER — Telehealth: Payer: Medicare Other | Admitting: Physician Assistant

## 2023-07-13 ENCOUNTER — Ambulatory Visit: Payer: Self-pay

## 2023-07-13 DIAGNOSIS — J208 Acute bronchitis due to other specified organisms: Secondary | ICD-10-CM

## 2023-07-13 DIAGNOSIS — B9689 Other specified bacterial agents as the cause of diseases classified elsewhere: Secondary | ICD-10-CM

## 2023-07-13 MED ORDER — AZITHROMYCIN 250 MG PO TABS: ORAL_TABLET | ORAL | 0 refills | Status: AC

## 2023-07-13 NOTE — Telephone Encounter (Signed)
  Chief Complaint: Fever Symptoms: fever, weakness, chills, sore throat, cough, one vomiting episode Frequency: Constant Pertinent Negatives: Patient denies abd pain, diarrhea, current n/v, earache, SOB Disposition: [] ED /[] Urgent Care (no appt availability in office) / [x] Appointment(In office/virtual)/ []  [facility]/ [] Home Care/ [] Refused Recommended Disposition /[] [place] [facility]/ []  Follow-up with PCP Additional Notes: Patient called with complaints of fever, weakness, chills, sore throat, and cough. Patient states the symptoms began around Monday, and has had constant fevers of 100.655F and above, with the highest fever given as 101.223F. Patient states that she was with her mom prior to mom testing positive for COVID, however states that she has been doing regular COVID tests and has not tested positive. Patient statea she takes [place], and has been using homeopathic supplements and her regularly prescribed Asprin for fever treatment, but fevers presist. Patient gave current temperature as 99.2, and states that her chills improve when using heating blankets. Patient denies n/v, diarrhea, headache, SOB, and earache. Patient advised by this RN to be seen today to which patient was agreeable. VV scheduled. Patient advised by this RN to call back with worsening symptoms. Patient verbalized understanding.   Reason for Disposition  Fever present > 3 days (72 hours)  Answer Assessment - Initial Assessment Questions 1. TEMPERATURE: "What is the most recent temperature?"  "How was it measured?"      101.223F, 100.655F last night, now 99.55F, (normal 97.23F) 2. ONSET: "When did the fever start?"      Monday 3. CHILLS: "Do you have chills?" If yes: "How bad are they?"  (e.g., none, mild, moderate, severe)   - NONE: no chills   - MILD: feeling cold   - MODERATE: feeling very cold, some shivering (feels better under a thick blanket)   - SEVERE: feeling extremely cold with shaking chills  (general body shaking, rigors; even under a thick blanket)      Moderate 4. OTHER SYMPTOMS: "Do you have any other symptoms besides the fever?"  (e.g., abdomen pain, cough, diarrhea, earache, headache, sore throat, urination pain)     Sore throat, headache (now broken), n/v, weakness, cough 5. CAUSE: If there are no symptoms, ask: "What do you think is causing the fever?"      "I've neem taking COVID tests but they haven't come back positive, I think I may have gotten this from my mom cause she did have COVID. I was with her for hours before we knew she had COVID." 6. CONTACTS: "Does anyone else in the family have an infection?"     Mom - COVID+. 7. TREATMENT: "What have you done so far to treat this fever?" (e.g., medications)     Baby aspirin, homeopathic supplements. 8. IMMUNOCOMPROMISE: "Do you have of the following: diabetes, HIV positive, splenectomy, cancer chemotherapy, chronic steroid treatment, transplant patient, etc."     Cancer treatments - keytruda. 10. TRAVEL: "Have you traveled out of the country in the last month?" (e.g., travel history, exposures)       Denies  Protocols used: [facility]

## 2023-07-13 NOTE — Patient Instructions (Signed)
Sherry Williams, thank you for joining [name], PA-C for today's virtual visit.  While this provider is not your primary care provider (PCP), if your PCP is located in our provider database this encounter information will be shared with them immediately following your visit.   A [facility] MyChart account gives you access to today's visit and all your visits, tests, and labs performed at [facility] " click here if you don't have a [facility] MyChart account or go to [email or web address]  Consent: (Patient) Sherry Williams provided verbal consent for this virtual visit at the beginning of the encounter.  Current Medications:  Current Outpatient Medications:    azithromycin (ZITHROMAX) 250 MG tablet, Take 2 tablets on day 1, then 1 tablet daily on days 2 through 5, Disp: 6 tablet, Rfl: 0   aspirin EC (ASPIR-LOW) 81 MG tablet, Take 81 mg by mouth daily., Disp: , Rfl:    cimetidine (TAGAMET) 400 MG tablet, Take 400 mg by mouth 2 (two) times daily. 03/23/2023 Only takes M-F., Disp: , Rfl:    dipyridamole (PERSANTINE) 75 MG tablet, Take 75 mg by mouth 2 (two) times daily., Disp: , Rfl:    doxycycline (MONODOX) 100 MG capsule, Take 100 mg by mouth 2 (two) times daily., Disp: , Rfl:    fondaparinux (ARIXTRA) 7.5 MG/0.6ML SOLN injection, INJECT 0.6 MLS INTO THE SKIN DAILY, Disp: 18 mL, Rfl: 4   lidocaine-prilocaine (EMLA) cream, APPLY TOPICALLY TO THE AFFECTED AREA 1 TIME, Disp: 30 g, Rfl: 4   metFORMIN (GLUCOPHAGE) 500 MG tablet, Take 500 mg by mouth 2 (two) times daily., Disp: , Rfl:    Naltrexone HCl, Pain, (NALTREX) 4.5 MG CAPS, Take by mouth at bedtime., Disp: , Rfl:    OLANZapine (ZYPREXA) 10 MG tablet, TAKE 1 TABLET BY MOUTH EVERY DAY, Disp: 30 tablet, Rfl: 6   ondansetron (ZOFRAN) 8 MG tablet, Take 1 tablet (8 mg total) by mouth every 8 (eight) hours as needed for nausea or vomiting., Disp: 30 tablet, Rfl: 1   Probiotic Product (PROBIOTIC BLEND PO), Take by  mouth daily., Disp: , Rfl:    prochlorperazine (COMPAZINE) 10 MG tablet, Take 1 tablet (10 mg total) by mouth every 6 (six) hours as needed for nausea or vomiting. (Patient not taking: Reported on 03/23/2023), Disp: 30 tablet, Rfl: 1   thyroid (ARMOUR) 60 MG tablet, Take 60 mg by mouth every morning., Disp: , Rfl:    Medications ordered in this encounter:  Meds ordered this encounter  Medications   azithromycin (ZITHROMAX) 250 MG tablet    Sig: Take 2 tablets on day 1, then 1 tablet daily on days 2 through 5    Dispense:  6 tablet    Refill:  0    Supervising Provider:   Merrilee Jansky [phone]     *If you need refills on other medications prior to your next appointment, please contact your pharmacy*  Follow-Up: Call back or seek an in-person evaluation if the symptoms worsen or if the condition fails to improve as anticipated.  [facility] [phone]  Other Instructions Fever, Adult     A fever is a high body temperature that is 100.37F (38C) or higher. Brief mild or moderate fevers generally have no lasting effects, and they often do not need treatment. Moderate or high fevers can feel uncomfortable and can sometimes be a sign of a serious problem. Fevers can also cause dehydration because the body may sweat, especially if the  fever keeps coming back or lasts a long time. You can use a thermometer to check for a fever. Body temperature can change with: Age. Time of day. Where the temperature is taken, such as in the mouth, rectum, ear, under the arm, or on the forehead. Follow these instructions at home: Medicines Take over-the-counter and prescription medicines only as told by your health care provider. Follow instructions on how much medicine to take and how often. If you were prescribed antibiotics, take them as told by your provider. Do not stop using the antibiotic even if you start to feel better. General instructions Watch for any changes in your  symptoms. Let your provider know about them. Rest as needed. Drink enough fluid to keep your pee (urine) pale yellow. This helps to prevent dehydration. Bathe or sponge bathe with room-temperature water to help lower your body temperature as needed. Do not use cold water. Do not use too many blankets or wear heavy clothes. Stay home from work and public places for at least 24 hours after your fever is gone. Your fever should be gone without having to use medicines. Contact a health care provider if: You have vomiting or diarrhea that does not get better. You cannot eat or drink without vomiting. You have pain when you pee (urinate). Your symptoms do not get better with treatment or you have new symptoms. You have a skin rash. You have signs of dehydration, such as: Dark pee, very little pee, or no pee. Cracked lips or dry mouth. Sunken eyes. Sleepiness. Weakness. Get help right away if: You have shortness of breath or trouble breathing. You feel dizzy or you faint. You are confused and do not know the time of day, where you are, or who you are (disoriented). You have severe pain in your abdomen. These symptoms may be an emergency. Get help right away. Call 911. Do not wait to see if the symptoms will go away. Do not drive yourself to the hospital. This information is not intended to replace advice given to you by your health care provider. Make sure you discuss any questions you have with your health care provider. Document Revised: 02/28/2022 Document Reviewed: 02/28/2022 Elsevier Patient Education  2024 Elsevier Inc.   If you have been instructed to have an in-person evaluation today at a local Urgent Care facility, please use the link below. It will take you to a list of all of our available [facility], including address, phone number and hours of operation. Please do not delay care.  [facility]  If you or a family member do not have a primary care  provider, use the link below to schedule a visit and establish care. When you choose a [facility] primary care physician or advanced practice provider, you gain a long-term partner in health. Find a Primary Care Provider  Learn more about [facility]'s in-office and virtual care options: [facility] - Get Care Now

## 2023-07-13 NOTE — Telephone Encounter (Signed)
Copied from CRM [id number]. Topic: Clinical - Red Word Triage >> Jul 13, 2023  9:39 AM Marica Otter wrote: Red Word that prompted transfer to Nurse Triage: Fever not breaking since Sunday(101, 101.7), cough deep in chest, fatigue, no appetite, vomited one day.

## 2023-07-13 NOTE — Progress Notes (Signed)
Virtual Visit Consent   Sherry Williams, you are scheduled for a virtual visit with a [facility] provider today. Just as with appointments in the office, your consent must be obtained to participate. Your consent will be active for this visit and any virtual visit you may have with one of our providers in the next 365 days. If you have a MyChart account, a copy of this consent can be sent to you electronically.  As this is a virtual visit, video technology does not allow for your provider to perform a traditional examination. This may limit your provider's ability to fully assess your condition. If your provider identifies any concerns that need to be evaluated in person or the need to arrange testing (such as labs, EKG, etc.), we will make arrangements to do so. Although advances in technology are sophisticated, we cannot ensure that it will always work on either your end or our end. If the connection with a video visit is poor, the visit may have to be switched to a telephone visit. With either a video or telephone visit, we are not always able to ensure that we have a secure connection.  By engaging in this virtual visit, you consent to the provision of healthcare and authorize for your insurance to be billed (if applicable) for the services provided during this visit. Depending on your insurance coverage, you may receive a charge related to this service.  I need to obtain your verbal consent now. Are you willing to proceed with your visit today? Sherry Williams has provided verbal consent on 07/13/2023 for a virtual visit (video or telephone). [name], PA-C  Date: 07/13/2023 2:33 PM  Virtual Visit via Video Note   I, [name], connected with  Sherry Williams  ([id number], November 23, 1960) on 07/13/23 at  2:15 PM EST by a video-enabled telemedicine application and verified that I am speaking with the correct person using two identifiers.  Location: Patient: Virtual Visit  Location Patient: Home Provider: Virtual Visit Location Provider: Home Office   I discussed the limitations of evaluation and management by telemedicine and the availability of in person appointments. The patient expressed understanding and agreed to proceed.    History of Present Illness: Sherry Williams is a 63 y.o. who identifies as a female who was assigned female at birth, and is being seen today for fever.  HPI: Fever  This is a new problem. The current episode started in the past 7 days (Started Monday, 07/09/23). The problem occurs intermittently. The problem has been unchanged. The maximum temperature noted was 101 to 101.9 F (99 this morning; highest 101.7). The temperature was taken using an oral thermometer. Associated symptoms include congestion, coughing, headaches, muscle aches, nausea (once), sleepiness, a sore throat (initial) and vomiting (once). Pertinent negatives include no chest pain, diarrhea, ear pain or wheezing. Associated symptoms comments: Chills, decreased appetite lost 7 pounds due to decreased appetite. Treatments tried: salt water gargle, homeopathic treatments, lymphatic massage, ASA. The treatment provided no relief.  Risk factors: hx of cancer and immunosuppression   Risk factors: no recent sickness   Has been taking at home Covid test that have been negative Flu test was negative today     Problems:  Patient Active Problem List   Diagnosis Date Noted   Genetic testing 12/18/2019   Goals of care, counseling/discussion 12/10/2019   Family history of prostate cancer in father    Abnormal genetic test    Family history of breast  cancer    Family history of pancreatic cancer    Pelvic lymphadenopathy    Lymphadenopathy, abdominal    CVA (cerebral vascular accident) (HCC) 07/29/2019   DVT (deep venous thrombosis) (HCC)    Nephrolithiasis 05/17/2018   Endometrial cancer (HCC) 04/17/2017   MRSA (methicillin resistant staph aureus) culture positive  03/14/2017   Lichen sclerosus et atrophicus 03/14/2017   Endometrial ca (HCC) 03/14/2017   Pulmonary embolus (HCC) 08/14/2016   Hypothyroidism, acquired, 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 03/23/2023 Only takes M-F., Disp: , Rfl:    dipyridamole (PERSANTINE) 75 MG tablet, Take 75 mg by mouth 2 (two) times daily., Disp: , Rfl:    doxycycline (MONODOX) 100 MG capsule, Take 100 mg by mouth 2 (two) times daily., Disp: , Rfl:    fondaparinux (ARIXTRA) 7.5 MG/0.6ML SOLN injection, INJECT 0.6 MLS INTO THE SKIN DAILY, Disp: 18 mL, Rfl: 4   lidocaine-prilocaine (EMLA) cream, APPLY TOPICALLY TO THE AFFECTED AREA 1 TIME, Disp: 30 g, Rfl: 4   metFORMIN (GLUCOPHAGE) 500 MG tablet, Take 500 mg by mouth 2 (two) times daily., Disp: , Rfl:    Naltrexone HCl, Pain, (NALTREX) 4.5 MG CAPS, Take by mouth at bedtime., Disp: , Rfl:    OLANZapine (ZYPREXA) 10 MG tablet, TAKE 1 TABLET BY MOUTH EVERY DAY, Disp: 30 tablet, Rfl: 6   ondansetron (ZOFRAN) 8 MG tablet, Take 1 tablet (8 mg total) by mouth every 8 (eight) hours as needed for nausea or vomiting., Disp: 30 tablet, Rfl: 1   Probiotic Product (PROBIOTIC BLEND PO), Take by mouth daily., Disp: , Rfl:    prochlorperazine (COMPAZINE) 10 MG tablet, Take 1 tablet (10 mg total) by mouth every 6 (six) hours as needed for nausea or vomiting. (Patient not taking: Reported on 03/23/2023), Disp: 30 tablet, Rfl: 1   thyroid  (ARMOUR) 60 MG tablet, Take 60 mg by mouth every morning., Disp: , Rfl:   Observations/Objective: Patient is well-developed, well-nourished in no acute distress.  Resting comfortably at home.  Head is normocephalic, atraumatic.  No labored breathing.  Speech is clear and coherent with logical content.  Patient is alert and oriented at baseline.    Assessment and Plan: 1. Acute bacterial bronchitis (Primary) - azithromycin (ZITHROMAX) 250 MG tablet; Take 2 tablets on day 1, then 1 tablet daily on days 2 through 5  Dispense: 6 tablet; Refill: 0  - Worsening over a week despite OTC medications - Will treat with Z-pack - Can add Mucinex  - Push fluids.  - Rest.  - Steam and humidifier can help - Seek in person evaluation if worsening or symptoms fail to improve    Follow Up Instructions: I discussed the assessment and treatment plan with the patient. The patient was provided an opportunity to ask questions and all were answered. The patient agreed with the plan and demonstrated an understanding of the  instructions.  A copy of instructions were sent to the patient via MyChart unless otherwise noted below.    The patient was advised to call back or seek an in-person evaluation if the symptoms worsen or if the condition fails to improve as anticipated.    [name], PA-C

## 2023-07-14 ENCOUNTER — Other Ambulatory Visit: Payer: Self-pay

## 2023-07-20 ENCOUNTER — Other Ambulatory Visit: Payer: Self-pay

## 2023-07-20 ENCOUNTER — Encounter: Payer: Self-pay | Admitting: Hematology & Oncology

## 2023-07-20 ENCOUNTER — Inpatient Hospital Stay: Payer: Medicare Other

## 2023-07-20 ENCOUNTER — Inpatient Hospital Stay (HOSPITAL_BASED_OUTPATIENT_CLINIC_OR_DEPARTMENT_OTHER): Payer: Medicare Other | Admitting: Hematology & Oncology

## 2023-07-20 ENCOUNTER — Inpatient Hospital Stay: Payer: Medicare Other | Attending: Hematology & Oncology

## 2023-07-20 VITALS — BP 104/40 | HR 85 | Temp 97.8°F | Resp 17 | Ht 66.0 in | Wt 118.0 lb

## 2023-07-20 VITALS — BP 88/46 | HR 64 | Resp 16

## 2023-07-20 DIAGNOSIS — E063 Autoimmune thyroiditis: Secondary | ICD-10-CM

## 2023-07-20 DIAGNOSIS — Z79620 Long term (current) use of immunosuppressive biologic: Secondary | ICD-10-CM | POA: Insufficient documentation

## 2023-07-20 DIAGNOSIS — C541 Malignant neoplasm of endometrium: Secondary | ICD-10-CM | POA: Insufficient documentation

## 2023-07-20 DIAGNOSIS — Z5112 Encounter for antineoplastic immunotherapy: Secondary | ICD-10-CM | POA: Diagnosis not present

## 2023-07-20 LAB — CBC WITH DIFFERENTIAL (CANCER CENTER ONLY)
Abs Immature Granulocytes: 0.01 10*3/uL (ref 0.00–0.07)
Basophils Absolute: 0 10*3/uL (ref 0.0–0.1)
Basophils Relative: 0 %
Eosinophils Absolute: 0 10*3/uL (ref 0.0–0.5)
Eosinophils Relative: 0 %
HCT: 32.3 % — ABNORMAL LOW (ref 36.0–46.0)
Hemoglobin: 10.8 g/dL — ABNORMAL LOW (ref 12.0–15.0)
Immature Granulocytes: 0 %
Lymphocytes Relative: 39 %
Lymphs Abs: 1.1 10*3/uL (ref 0.7–4.0)
MCH: 32.3 pg (ref 26.0–34.0)
MCHC: 33.4 g/dL (ref 30.0–36.0)
MCV: 96.7 fL (ref 80.0–100.0)
Monocytes Absolute: 0.4 10*3/uL (ref 0.1–1.0)
Monocytes Relative: 13 %
Neutro Abs: 1.4 10*3/uL — ABNORMAL LOW (ref 1.7–7.7)
Neutrophils Relative %: 48 %
Platelet Count: 363 10*3/uL (ref 150–400)
RBC: 3.34 MIL/uL — ABNORMAL LOW (ref 3.87–5.11)
RDW: 11.8 % (ref 11.5–15.5)
WBC Count: 2.9 10*3/uL — ABNORMAL LOW (ref 4.0–10.5)
nRBC: 0 % (ref 0.0–0.2)

## 2023-07-20 LAB — CMP (CANCER CENTER ONLY)
ALT: 10 U/L (ref 0–44)
AST: 13 U/L — ABNORMAL LOW (ref 15–41)
Albumin: 4.2 g/dL (ref 3.5–5.0)
Alkaline Phosphatase: 65 U/L (ref 38–126)
Anion gap: 9 (ref 5–15)
BUN: 15 mg/dL (ref 8–23)
CO2: 29 mmol/L (ref 22–32)
Calcium: 9.4 mg/dL (ref 8.9–10.3)
Chloride: 103 mmol/L (ref 98–111)
Creatinine: 0.7 mg/dL (ref 0.44–1.00)
GFR, Estimated: >60 mL/min (ref >60)
Glucose, Bld: 79 mg/dL (ref 70–99)
Potassium: 4 mmol/L (ref 3.5–5.1)
Sodium: 141 mmol/L (ref 135–145)
Total Bilirubin: 1 mg/dL (ref 0.0–1.2)
Total Protein: 6.2 g/dL — ABNORMAL LOW (ref 6.5–8.1)

## 2023-07-20 LAB — IRON AND IRON BINDING CAPACITY (CC-WL,HP ONLY)
Iron: 158 ug/dL (ref 28–170)
Saturation Ratios: 50 % — ABNORMAL HIGH (ref 10.4–31.8)
TIBC: 316 ug/dL (ref 250–450)
UIBC: 158 ug/dL (ref 148–442)

## 2023-07-20 LAB — RETICULOCYTES
Immature Retic Fract: 22.7 % — ABNORMAL HIGH (ref 2.3–15.9)
RBC.: 3.34 MIL/uL — ABNORMAL LOW (ref 3.87–5.11)
Retic Count, Absolute: 88.5 10*3/uL (ref 19.0–186.0)
Retic Ct Pct: 2.7 % (ref 0.4–3.1)

## 2023-07-20 MED ORDER — SODIUM CHLORIDE 0.9 % IV SOLN: Freq: Once | INTRAVENOUS | Status: AC

## 2023-07-20 MED ORDER — SODIUM CHLORIDE 0.9% FLUSH
10.0000 mL | INTRAVENOUS | Status: DC | PRN
  Administered 2023-07-20: 10 mL

## 2023-07-20 MED ORDER — SODIUM CHLORIDE 0.9 % IV SOLN
400.0000 mg | Freq: Once | INTRAVENOUS | Status: AC
  Administered 2023-07-20: 400 mg via INTRAVENOUS
  Filled 2023-07-20: qty 16

## 2023-07-20 MED ORDER — HEPARIN SOD (PORK) LOCK FLUSH 100 UNIT/ML IV SOLN
500.0000 [IU] | Freq: Once | INTRAVENOUS | Status: AC | PRN
  Administered 2023-07-20: 500 [IU]

## 2023-07-20 NOTE — Patient Instructions (Signed)
 [facility] - A DEPT OF [facility]  Discharge Instructions: Thank you for choosing [facility] to provide your oncology and hematology care.   If you have a lab appointment with the Cancer Center, please go directly to the Cancer Center and check in at the registration area.  Wear comfortable clothing and clothing appropriate for easy access to any Portacath or PICC line.   We strive to give you quality time with your provider. You may need to reschedule your appointment if you arrive late (15 or more minutes).  Arriving late affects you and other patients whose appointments are after yours.  Also, if you miss three or more appointments without notifying the office, you may be dismissed from the clinic at the provider's discretion.      For prescription refill requests, have your pharmacy contact our office and allow 72 hours for refills to be completed.    Today you received the following chemotherapy and/or immunotherapy agents Pembrolizumab      To help prevent nausea and vomiting after your treatment, we encourage you to take your nausea medication as directed.  BELOW ARE SYMPTOMS THAT SHOULD BE REPORTED IMMEDIATELY: *FEVER GREATER THAN 100.4 F (38 C) OR HIGHER *CHILLS OR SWEATING *NAUSEA AND VOMITING THAT IS NOT CONTROLLED WITH YOUR NAUSEA MEDICATION *UNUSUAL SHORTNESS OF BREATH *UNUSUAL BRUISING OR BLEEDING *URINARY PROBLEMS (pain or burning when urinating, or frequent urination) *BOWEL PROBLEMS (unusual diarrhea, constipation, pain near the anus) TENDERNESS IN MOUTH AND THROAT WITH OR WITHOUT PRESENCE OF ULCERS (sore throat, sores in mouth, or a toothache) UNUSUAL RASH, SWELLING OR PAIN  UNUSUAL VAGINAL DISCHARGE OR ITCHING   Items with * indicate a potential emergency and should be followed up as soon as possible or go to the Emergency Department if any problems should occur.  Please show the CHEMOTHERAPY ALERT CARD or  IMMUNOTHERAPY ALERT CARD at check-in to the Emergency Department and triage nurse. Should you have questions after your visit or need to cancel or reschedule your appointment, please contact [facility] - A DEPT OF [facility]  [phone] and follow the prompts.  Office hours are 8:00 a.m. to 4:30 p.m. Monday - Friday. Please note that voicemails left after 4:00 p.m. may not be returned until the following business day.  We are closed weekends and major holidays. You have access to a nurse at all times for urgent questions. Please call the main number to the clinic [phone] and follow the prompts.  For any non-urgent questions, you may also contact your provider using MyChart. We now offer e-Visits for anyone 65 and older to request care online for non-urgent symptoms. For details visit [email or web address].   Also download the MyChart app! Go to the app store, search "MyChart", open the app, select [place], and log in with your MyChart username and password.

## 2023-07-20 NOTE — Progress Notes (Signed)
 Hematology and Oncology Follow Up Visit  Sherry Williams [id number] June 17, 1960 63 y.o. 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  She is trying to get over a viral infection.  She has been have this for a couple weeks.  She had a birthday just 3 days ago.  She did go out for her birthday but cannot eat all that much.  She is lost a little bit of weight.  As far as her cancer is concerned, she has done very well with this.  She had a PET scan that was done back in 06/11/2023.  The PET scan not show any evidence of active disease.  Her last CA 125 was still normal at 8.3.  She has had no change in bowel or bladder habits.  She has had little bit of a cough and congestion.  She was placed on antibiotics with a Z-Pak.  She has had little bit of a fever.  She has had no bleeding.  She continues on the Arixtra .  She has not been able to walk as much as she would like because of this cold.  She has had no headache.  Overall, I would say that her performance status is probably ECOG 1.      Medications:  Current Outpatient Medications:    aspirin EC (ASPIR-LOW) 81 MG tablet, Take 81 mg by mouth daily., Disp: , Rfl:    cimetidine (TAGAMET) 400 MG tablet, Take 400 mg by mouth 2 (two) times daily. 03/23/2023 Only takes M-F., Disp: , Rfl:    dipyridamole (PERSANTINE) 75 MG tablet, Take 75 mg by mouth 2 (two) times daily., Disp: , Rfl:    doxycycline (MONODOX) 100 MG capsule, Take 100 mg by mouth 2 (two) times daily., Disp: , Rfl:    fondaparinux  (ARIXTRA )  7.5 MG/0.6ML SOLN injection, INJECT 0.6 MLS INTO THE SKIN DAILY, Disp: 18 mL, Rfl: 4   lidocaine -prilocaine  (EMLA ) cream, APPLY TOPICALLY TO THE AFFECTED AREA 1 TIME, Disp: 30 g, Rfl: 4   metFORMIN (GLUCOPHAGE) 500 MG tablet, Take 500 mg by mouth 2 (two) times daily., Disp: , Rfl:    Naltrexone HCl, Pain, (NALTREX) 4.5 MG CAPS, Take by mouth at bedtime., Disp: , Rfl:    ondansetron  (ZOFRAN ) 8 MG tablet, Take 1 tablet (8 mg total) by mouth every 8 (eight) hours as needed for nausea or vomiting., Disp: 30 tablet, Rfl: 1   Probiotic Product (PROBIOTIC BLEND PO), Take by mouth daily., Disp: , Rfl:   Allergies:  Allergies  Allergen Reactions   Bee Venom Anaphylaxis, Swelling and Other (See Comments)   Aspartame Diarrhea   Aspartame And Phenylalanine Diarrhea   Adhesive [Tape] Other (See Comments)    Irritation and red    Past Medical History, Surgical history, Social history, and Family History were reviewed and updated.  Review of Systems: Review of Systems  Constitutional: Negative.   HENT: Negative.    Eyes: Negative.   Respiratory: Negative.    Cardiovascular: Negative.   Gastrointestinal:  Positive for abdominal pain.  Genitourinary:  Negative.   Musculoskeletal: Negative.   Skin: Negative.   Neurological: Negative.   Endo/Heme/Allergies: Negative.   Psychiatric/Behavioral: Negative.      Physical Exam: Her vital signs show temperature of 97.8.  Pulse 85.  Blood pressure 104/40.  Weight is 118 pounds.   .    Wt Readings from Last 3 Encounters:  07/20/23 118 lb (53.5 kg)  06/08/23 124 lb (56.2 kg)  04/27/23 127 lb (57.6 kg)    Physical Exam Vitals reviewed.  HENT:     Head: Normocephalic and atraumatic.  Eyes:     Pupils: Pupils are equal, round, and reactive to light.  Cardiovascular:     Rate and Rhythm: Normal rate and regular rhythm.     Heart sounds: Normal heart sounds.  Pulmonary:     Effort: Pulmonary effort is normal.     Breath sounds: Normal breath  sounds.  Abdominal:     General: Bowel sounds are normal.     Palpations: Abdomen is soft.     Comments: Abdominal exam shows a soft abdomen.  She has well healing laparoscopic scars.  I think there are 5 laparoscopic scars.  She has no erythema or exudate or warmth associated with these.  She has no fluid wave in the abdomen.  There is no guarding or rebound tenderness.  She has no palpable liver or spleen tip.  Musculoskeletal:        General: No tenderness or deformity. Normal range of motion.     Cervical back: Normal range of motion.  Lymphadenopathy:     Cervical: No cervical adenopathy.  Skin:    General: Skin is warm and dry.     Findings: No erythema or rash.  Neurological:     Mental Status: She is alert and oriented to person, place, and time.  Psychiatric:        Behavior: Behavior normal.        Thought Content: Thought content normal.        Judgment: Judgment normal.     Lab Results  Component Value Date   WBC 2.9 (L) 07/20/2023   HGB 10.8 (L) 07/20/2023   HCT 32.3 (L) 07/20/2023   MCV 96.7 07/20/2023   PLT 363 07/20/2023     Chemistry      Component Value Date/Time   NA 141 07/20/2023 0930   NA 142 02/15/2017 1518   NA 140 04/13/2016 1055   K 4.0 07/20/2023 0930   K 4.1 02/15/2017 1518   K 4.1 04/13/2016 1055   CL 103 07/20/2023 0930   CL 107 02/15/2017 1518   CO2 29 07/20/2023 0930   CO2 29 02/15/2017 1518   CO2 27 04/13/2016 1055   BUN 15 07/20/2023 0930   BUN 17 02/15/2017 1518   BUN 22.1 04/13/2016 1055   CREATININE 0.70 07/20/2023 0930   CREATININE 1.0 02/15/2017 1518   CREATININE 0.8 04/13/2016 1055      Component Value Date/Time   CALCIUM  9.4 07/20/2023 0930   CALCIUM  9.3 02/15/2017 1518   CALCIUM  9.9 04/13/2016 1055   ALKPHOS 65 07/20/2023 0930   ALKPHOS 79 02/15/2017 1518   ALKPHOS 81 04/13/2016 1055   AST 13 (L) 07/20/2023 0930   AST 20 04/13/2016 1055   ALT 10 07/20/2023 0930   ALT 24 02/15/2017 1518   ALT 22 04/13/2016 1055    BILITOT 1.0 07/20/2023 0930   BILITOT 0.44 04/13/2016 1055      Impression and Plan: Sherry Williams is a 63 year old  Caucasian female.  She has had another recurrence of her gynecologic malignancy.  We gave her chemotherapy.  She did incredibly well with chemotherapy.  She now is on maintenance immunotherapy.  I am just very happy that she is done so well.  The last PET scan showed that she has done incredibly well.  She is in remission.  I think we can go ahead with her Keytruda  today.  Hopefully, at some point, if she continues to do well, we will be able to move the Keytruda  out to maybe 2 months.  Her white cell count is still on the lower side.  However, it does seem to be gradually increasing.  We will plan to get her back in another 6 weeks.  I do not think we need a PET scan probably until April or May.   Maude JONELLE Crease, MD 2/7/202510:20 AM

## 2023-07-20 NOTE — Patient Instructions (Signed)

## 2023-07-20 NOTE — Progress Notes (Signed)
 WBC 2.9  with ANC 1.4. Ok to treat per Dr. Zachery Hermes.

## 2023-07-22 LAB — CA 125: Cancer Antigen (CA) 125: 7.8 U/mL (ref 0.0–38.1)

## 2023-07-23 ENCOUNTER — Other Ambulatory Visit: Payer: Self-pay | Admitting: Hematology & Oncology

## 2023-08-14 DIAGNOSIS — R5383 Other fatigue: Secondary | ICD-10-CM | POA: Diagnosis not present

## 2023-08-14 DIAGNOSIS — Z8639 Personal history of other endocrine, nutritional and metabolic disease: Secondary | ICD-10-CM | POA: Diagnosis not present

## 2023-08-14 DIAGNOSIS — E559 Vitamin D deficiency, unspecified: Secondary | ICD-10-CM | POA: Diagnosis not present

## 2023-08-29 ENCOUNTER — Encounter: Payer: Self-pay | Admitting: Hematology & Oncology

## 2023-08-31 ENCOUNTER — Encounter: Payer: Self-pay | Admitting: Hematology & Oncology

## 2023-08-31 ENCOUNTER — Inpatient Hospital Stay: Payer: Medicare Other

## 2023-08-31 ENCOUNTER — Other Ambulatory Visit: Payer: Self-pay

## 2023-08-31 ENCOUNTER — Inpatient Hospital Stay: Payer: Medicare Other | Attending: Hematology & Oncology

## 2023-08-31 ENCOUNTER — Inpatient Hospital Stay: Payer: Medicare Other | Admitting: Hematology & Oncology

## 2023-08-31 VITALS — BP 107/46 | HR 68 | Temp 97.9°F | Resp 17 | Ht 66.0 in | Wt 124.0 lb

## 2023-08-31 DIAGNOSIS — C541 Malignant neoplasm of endometrium: Secondary | ICD-10-CM

## 2023-08-31 DIAGNOSIS — Z79620 Long term (current) use of immunosuppressive biologic: Secondary | ICD-10-CM | POA: Diagnosis not present

## 2023-08-31 DIAGNOSIS — Z5112 Encounter for antineoplastic immunotherapy: Secondary | ICD-10-CM | POA: Insufficient documentation

## 2023-08-31 LAB — CBC WITH DIFFERENTIAL (CANCER CENTER ONLY)
Abs Immature Granulocytes: 0 10*3/uL (ref 0.00–0.07)
Basophils Absolute: 0 10*3/uL (ref 0.0–0.1)
Basophils Relative: 1 %
Eosinophils Absolute: 0 10*3/uL (ref 0.0–0.5)
Eosinophils Relative: 0 %
HCT: 33.5 % — ABNORMAL LOW (ref 36.0–46.0)
Hemoglobin: 10.9 g/dL — ABNORMAL LOW (ref 12.0–15.0)
Immature Granulocytes: 0 %
Lymphocytes Relative: 33 %
Lymphs Abs: 1 10*3/uL (ref 0.7–4.0)
MCH: 32.8 pg (ref 26.0–34.0)
MCHC: 32.5 g/dL (ref 30.0–36.0)
MCV: 100.9 fL — ABNORMAL HIGH (ref 80.0–100.0)
Monocytes Absolute: 0.4 10*3/uL (ref 0.1–1.0)
Monocytes Relative: 12 %
Neutro Abs: 1.7 10*3/uL (ref 1.7–7.7)
Neutrophils Relative %: 54 %
Platelet Count: 256 10*3/uL (ref 150–400)
RBC: 3.32 MIL/uL — ABNORMAL LOW (ref 3.87–5.11)
RDW: 12.4 % (ref 11.5–15.5)
WBC Count: 3.2 10*3/uL — ABNORMAL LOW (ref 4.0–10.5)
nRBC: 0 % (ref 0.0–0.2)

## 2023-08-31 LAB — CMP (CANCER CENTER ONLY)
ALT: 13 U/L (ref 0–44)
AST: 17 U/L (ref 15–41)
Albumin: 4.3 g/dL (ref 3.5–5.0)
Alkaline Phosphatase: 72 U/L (ref 38–126)
Anion gap: 9 (ref 5–15)
BUN: 22 mg/dL (ref 8–23)
CO2: 27 mmol/L (ref 22–32)
Calcium: 9.1 mg/dL (ref 8.9–10.3)
Chloride: 104 mmol/L (ref 98–111)
Creatinine: 0.67 mg/dL (ref 0.44–1.00)
GFR, Estimated: >60 mL/min (ref >60)
Glucose, Bld: 78 mg/dL (ref 70–99)
Potassium: 4.2 mmol/L (ref 3.5–5.1)
Sodium: 140 mmol/L (ref 135–145)
Total Bilirubin: 0.8 mg/dL (ref 0.0–1.2)
Total Protein: 6 g/dL — ABNORMAL LOW (ref 6.5–8.1)

## 2023-08-31 LAB — TSH: TSH: 0.283 u[IU]/mL — ABNORMAL LOW (ref 0.350–4.500)

## 2023-08-31 MED ORDER — SODIUM CHLORIDE 0.9% FLUSH
10.0000 mL | INTRAVENOUS | Status: DC | PRN
Start: 2023-08-31 — End: 2023-08-31
  Administered 2023-08-31: 10 mL

## 2023-08-31 MED ORDER — SODIUM CHLORIDE 0.9 % IV SOLN: Freq: Once | INTRAVENOUS | Status: AC

## 2023-08-31 MED ORDER — HEPARIN SOD (PORK) LOCK FLUSH 100 UNIT/ML IV SOLN
500.0000 [IU] | Freq: Once | INTRAVENOUS | Status: AC | PRN
  Administered 2023-08-31: 500 [IU]

## 2023-08-31 MED ORDER — SODIUM CHLORIDE 0.9 % IV SOLN
400.0000 mg | Freq: Once | INTRAVENOUS | Status: AC
  Administered 2023-08-31: 400 mg via INTRAVENOUS
  Filled 2023-08-31: qty 16

## 2023-08-31 NOTE — Addendum Note (Signed)
 Addended by: Josph Macho on: 08/31/2023 10:44 AM   Modules accepted: Orders

## 2023-08-31 NOTE — Progress Notes (Signed)
 Hematology and Oncology Follow Up Visit  Sherry Williams [id number] 1961-01-28 63 y.o. 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  She is doing quite nicely.  She really has had no complaints since we last saw her.  She got over her respiratory infection that she had less of that we saw her.  Her last CEA 125 was 7.8.  This has been holding nice and low and stable.  She did have a Signatera test done by her holistic doctor.  This came back negative for any circulating cancer cells.  There has been no problems with nausea or vomiting.  There is no change in bowel or bladder habits.  She has had no rashes.  She continues on the Arixtra.  Everything is done well with the Arixtra.  She has had no headache.  There is been no dizziness.  She is eating quite well.  She is trying to watch what she eats.  She and her husband have a big trip planned for August.  I think they will be gone for a couple months.  I know that we can work around her vacation.  Currently, I would say that her performance status is probably ECOG 0.  Medications:  Current Outpatient Medications:    ARMOUR THYROID 90 MG tablet, Take 90 mg by mouth every morning., Disp: , Rfl:    aspirin EC (ASPIR-LOW) 81 MG tablet, Take 81 mg by mouth daily., Disp: , Rfl:    cimetidine (TAGAMET) 400 MG tablet, Take 400 mg by mouth 2 (two) times daily. 03/23/2023 Only takes M-F., Disp: , Rfl:    dipyridamole (PERSANTINE) 75 MG tablet, Take 75 mg by mouth 2 (two) times daily., Disp: , Rfl:    doxycycline  (MONODOX) 100 MG capsule, Take 100 mg by mouth 2 (two) times daily., Disp: , Rfl:    fondaparinux (ARIXTRA) 7.5 MG/0.6ML SOLN injection, INJECT 0.6 MLS INTO THE SKIN DAILY, Disp: 18 mL, Rfl: 4   lidocaine-prilocaine (EMLA) cream, APPLY TOPICALLY TO THE AFFECTED AREA 1 TIME, Disp: 30 g, Rfl: 4   metFORMIN (GLUCOPHAGE) 500 MG tablet, Take 500 mg by mouth 2 (two) times daily., Disp: , Rfl:    Naltrexone HCl, Pain, (NALTREX) 4.5 MG CAPS, Take by mouth at bedtime., Disp: , Rfl:    ondansetron (ZOFRAN) 8 MG tablet, Take 1 tablet (8 mg total) by mouth every 8 (eight) hours as needed for nausea or vomiting., Disp: 30 tablet, Rfl: 1   Probiotic Product (PROBIOTIC BLEND PO), Take by mouth daily., Disp: , Rfl:   Allergies:  Allergies  Allergen Reactions   Bee Venom Anaphylaxis, Swelling and Other (See Comments)   Aspartame Diarrhea   Aspartame And Phenylalanine Diarrhea   Adhesive [Tape] Other (See Comments)    Irritation and red    Past Medical History, Surgical history, Social history, and Family History were reviewed and updated.  Review of Systems: Review of Systems  Constitutional: Negative.   HENT: Negative.    Eyes: Negative.   Respiratory: Negative.    Cardiovascular:  Negative.   Gastrointestinal:  Positive for abdominal pain.  Genitourinary: Negative.   Musculoskeletal: Negative.   Skin: Negative.   Neurological: Negative.   Endo/Heme/Allergies: Negative.   Psychiatric/Behavioral: Negative.      Physical Exam: Her vital signs show temperature of 97.9.  Pulse 68.  Blood pressure 107/46.  Weight is 124 pounds.    .    Wt Readings from Last 3 Encounters:  08/31/23 124 lb (56.2 kg)  07/20/23 118 lb (53.5 kg)  06/08/23 124 lb (56.2 kg)    Physical Exam Vitals reviewed.  HENT:     Head: Normocephalic and atraumatic.  Eyes:     Pupils: Pupils are equal, round, and reactive to light.  Cardiovascular:     Rate and Rhythm: Normal rate and regular rhythm.     Heart sounds:  Normal heart sounds.  Pulmonary:     Effort: Pulmonary effort is normal.     Breath sounds: Normal breath sounds.  Abdominal:     General: Bowel sounds are normal.     Palpations: Abdomen is soft.     Comments: Abdominal exam shows a soft abdomen.  She has well healing laparoscopic scars.  I think there are 5 laparoscopic scars.  She has no erythema or exudate or warmth associated with these.  She has no fluid wave in the abdomen.  There is no guarding or rebound tenderness.  She has no palpable liver or spleen tip.  Musculoskeletal:        General: No tenderness or deformity. Normal range of motion.     Cervical back: Normal range of motion.  Lymphadenopathy:     Cervical: No cervical adenopathy.  Skin:    General: Skin is warm and dry.     Findings: No erythema or rash.  Neurological:     Mental Status: She is alert and oriented to person, place, and time.  Psychiatric:        Behavior: Behavior normal.        Thought Content: Thought content normal.        Judgment: Judgment normal.     Lab Results  Component Value Date   WBC 3.2 (L) 08/31/2023   HGB 10.9 (L) 08/31/2023   HCT 33.5 (L) 08/31/2023   MCV 100.9 (H) 08/31/2023   PLT 256 08/31/2023     Chemistry      Component Value Date/Time   NA 140 08/31/2023 0920   NA 142 02/15/2017 1518   NA 140 04/13/2016 1055   K 4.2 08/31/2023 0920   K 4.1 02/15/2017 1518   K 4.1 04/13/2016 1055   CL 104 08/31/2023 0920   CL 107 02/15/2017 1518   CO2 27 08/31/2023 0920   CO2 29 02/15/2017 1518   CO2 27 04/13/2016 1055   BUN 22 08/31/2023 0920   BUN 17 02/15/2017 1518   BUN 22.1 04/13/2016 1055   CREATININE 0.67 08/31/2023 0920   CREATININE 1.0 02/15/2017 1518   CREATININE 0.8 04/13/2016 1055      Component Value Date/Time   CALCIUM 9.1 08/31/2023 0920   CALCIUM 9.3 02/15/2017 1518   CALCIUM 9.9 04/13/2016 1055   ALKPHOS 72 08/31/2023 0920   ALKPHOS 79 02/15/2017 1518   ALKPHOS 81 04/13/2016 1055   AST 17 08/31/2023  0920   AST 20 04/13/2016 1055   ALT 13 08/31/2023 0920   ALT 24 02/15/2017 1518   ALT 22 04/13/2016 1055   BILITOT 0.8 08/31/2023 0920   BILITOT 0.44 04/13/2016 1055  Impression and Plan: Sherry Williams is a 63 year old Caucasian female.  She has had another recurrence of her gynecologic malignancy.  We gave her chemotherapy.  She did incredibly well with chemotherapy.  She now is on maintenance immunotherapy.  I am just very happy that she is done so well.  The last PET scan showed that she has done incredibly well.  She is in remission.  I think we can go ahead with her Keytruda today.  I will set up another PET scan for before we see her back.  We will going get this set up for probably late April.  If everything looks okay, then we will move her Rande Lawman out to every 2 months.  Again, I know she has this vacation planned in the Summer.  We will work around this so [place].     Josph Macho, MD 3/21/202510:17 AM

## 2023-08-31 NOTE — Patient Instructions (Signed)
 [facility] - A DEPT OF [facility]  Discharge Instructions: Thank you for choosing [facility] to provide your oncology and hematology care.   If you have a lab appointment with the Cancer Center, please go directly to the Cancer Center and check in at the registration area.  Wear comfortable clothing and clothing appropriate for easy access to any Portacath or PICC line.   We strive to give you quality time with your provider. You may need to reschedule your appointment if you arrive late (15 or more minutes).  Arriving late affects you and other patients whose appointments are after yours.  Also, if you miss three or more appointments without notifying the office, you may be dismissed from the clinic at the provider's discretion.      For prescription refill requests, have your pharmacy contact our office and allow 72 hours for refills to be completed.    Today you received the following chemotherapy and/or immunotherapy agents Pembrolizumab      To help prevent nausea and vomiting after your treatment, we encourage you to take your nausea medication as directed.  BELOW ARE SYMPTOMS THAT SHOULD BE REPORTED IMMEDIATELY: *FEVER GREATER THAN 100.4 F (38 C) OR HIGHER *CHILLS OR SWEATING *NAUSEA AND VOMITING THAT IS NOT CONTROLLED WITH YOUR NAUSEA MEDICATION *UNUSUAL SHORTNESS OF BREATH *UNUSUAL BRUISING OR BLEEDING *URINARY PROBLEMS (pain or burning when urinating, or frequent urination) *BOWEL PROBLEMS (unusual diarrhea, constipation, pain near the anus) TENDERNESS IN MOUTH AND THROAT WITH OR WITHOUT PRESENCE OF ULCERS (sore throat, sores in mouth, or a toothache) UNUSUAL RASH, SWELLING OR PAIN  UNUSUAL VAGINAL DISCHARGE OR ITCHING   Items with * indicate a potential emergency and should be followed up as soon as possible or go to the Emergency Department if any problems should occur.  Please show the CHEMOTHERAPY ALERT CARD or  IMMUNOTHERAPY ALERT CARD at check-in to the Emergency Department and triage nurse. Should you have questions after your visit or need to cancel or reschedule your appointment, please contact [facility] - A DEPT OF [facility]  [phone] and follow the prompts.  Office hours are 8:00 a.m. to 4:30 p.m. Monday - Friday. Please note that voicemails left after 4:00 p.m. may not be returned until the following business day.  We are closed weekends and major holidays. You have access to a nurse at all times for urgent questions. Please call the main number to the clinic [phone] and follow the prompts.  For any non-urgent questions, you may also contact your provider using MyChart. We now offer e-Visits for anyone 65 and older to request care online for non-urgent symptoms. For details visit [email or web address].   Also download the MyChart app! Go to the app store, search "MyChart", open the app, select [place], and log in with your MyChart username and password.

## 2023-09-01 ENCOUNTER — Other Ambulatory Visit: Payer: Self-pay

## 2023-09-03 ENCOUNTER — Encounter: Payer: Self-pay | Admitting: Hematology & Oncology

## 2023-09-04 ENCOUNTER — Encounter: Payer: Self-pay | Admitting: Hematology & Oncology

## 2023-09-04 LAB — T4: T4, Total: 5.9 ug/dL (ref 4.5–12.0)

## 2023-09-06 LAB — CA 125: Cancer Antigen (CA) 125: 9.9 U/mL (ref 0.0–38.1)

## 2023-09-07 ENCOUNTER — Telehealth: Payer: Self-pay | Admitting: *Deleted

## 2023-09-07 NOTE — Telephone Encounter (Signed)
-----   Message from Josph Macho sent at 09/07/2023  7:16 AM EDT ----- Please call and let her know that the tumor marker is still normal at 9.9.  Cindee Lame

## 2023-09-07 NOTE — Telephone Encounter (Signed)
 MyChart msg sent to pt.

## 2023-09-10 ENCOUNTER — Ambulatory Visit (INDEPENDENT_AMBULATORY_CARE_PROVIDER_SITE_OTHER): Payer: Medicare Other | Admitting: Internal Medicine

## 2023-09-10 ENCOUNTER — Encounter: Payer: Self-pay | Admitting: Internal Medicine

## 2023-09-10 VITALS — BP 120/80 | HR 74 | Temp 98.2°F | Ht 65.75 in | Wt 127.2 lb

## 2023-09-10 DIAGNOSIS — I82401 Acute embolism and thrombosis of unspecified deep veins of right lower extremity: Secondary | ICD-10-CM

## 2023-09-10 DIAGNOSIS — C541 Malignant neoplasm of endometrium: Secondary | ICD-10-CM | POA: Diagnosis not present

## 2023-09-10 DIAGNOSIS — I2699 Other pulmonary embolism without acute cor pulmonale: Secondary | ICD-10-CM | POA: Diagnosis not present

## 2023-09-10 DIAGNOSIS — R590 Localized enlarged lymph nodes: Secondary | ICD-10-CM | POA: Diagnosis not present

## 2023-09-10 NOTE — Progress Notes (Signed)
 New Patient Office Visit     CC/Reason for Visit: Establish care Previous PCP: Dr. Caryl Never Last Visit: Unknown  HPI: Sherry Williams is a 63 y.o. female who is coming in today for the above mentioned reasons. Past Medical History is significant for: Recurrent DVT/PE, history of CVA, history of endometrial cancer.  She has establishing care because she is in need of her annual Medicare wellness visit.  Dr. Myna Hidalgo is her oncologist.  She really prefers integrative medicine as opposed to conventional medicine.   Past Medical/Surgical History: Past Medical History:  Diagnosis Date   Abnormal genetic test    Allergy    Arthritis    DVT (deep venous thrombosis) (HCC)    lower extremity    Endometrial ca (HCC) 03/14/2017   uterine cancer 2018   Family history of adverse reaction to anesthesia    children had N/V    Family history of breast cancer    Family history of pancreatic cancer    Family history of prostate cancer in father    Goals of care, counseling/discussion 12/10/2019   History of kidney stones    Hypothyroidism    Hashimoto   Lichen sclerosus    MRSA (methicillin resistant staph aureus) culture positive    Pneumonia    walking   Pulmonary embolism (HCC) 01/13/2016   After car trip   Stroke [facility])    multiple     2-16 -2021, 4-15- 2021 no defecits   do to blood clots    Past Surgical History:  Procedure Laterality Date   ABDOMINAL HYSTERECTOMY     BUBBLE STUDY  07/31/2019   Procedure: BUBBLE STUDY;  Surgeon: Lewayne Bunting, MD;  Location: [facility] ENDOSCOPY;  Service: Cardiovascular;;   CYSTOSCOPY WITH RETROGRADE PYELOGRAM, URETEROSCOPY AND STENT PLACEMENT Left 05/18/2018   Procedure: CYSTOSCOPY WITH RETROGRADE PYELOGRAM, LEFT URETEROSCOPY AND LEFT URETERAL STENT PLACEMENT;  Surgeon: Crist Fat, MD;  Location: [facility] ORS;  Service: Urology;  Laterality: Left;   CYSTOSCOPY WITH RETROGRADE PYELOGRAM, URETEROSCOPY AND STENT PLACEMENT Right 05/30/2018    Procedure: CYSTOSCOPY WITH RIGHT RETROGRADE PYELOGRAM, URETEROSCOPY LASER LITHOTRIPSY AND STENT PLACEMENT;  Surgeon: Crist Fat, MD;  Location: [facility];  Service: Urology;  Laterality: Right;   HOLMIUM LASER APPLICATION Left 05/18/2018   Procedure: HOLMIUM LASER APPLICATION;  Surgeon: Crist Fat, MD;  Location: [facility] ORS;  Service: Urology;  Laterality: Left;   HOLMIUM LASER APPLICATION Right 05/30/2018   Procedure: HOLMIUM LASER APPLICATION;  Surgeon: Crist Fat, MD;  Location: [facility];  Service: Urology;  Laterality: Right;   IR CV LINE INJECTION  09/22/2020   IR CV LINE INJECTION  12/25/2022   IR IMAGING GUIDED PORT INSERTION  01/22/2020   MOUTH SURGERY     ROBOTIC ASSISTED TOTAL HYSTERECTOMY WITH BILATERAL SALPINGO OOPHERECTOMY Bilateral 04/17/2017   Procedure: XI ROBOTIC ASSISTED TOTAL HYSTERECTOMY WITH BILATERAL SALPINGO OOPHORECTOMY WITH SENTINAL LYMPH NODE BIOPSY AND POSSIBLE LYMPHADENECTOMY;  Surgeon: Cleda Mccreedy, MD;  Location: [facility]S;  Service: Gynecology;  Laterality: Bilateral;   ROBOTIC PELVIC AND PARA-AORTIC LYMPH NODE DISSECTION N/A 10/21/2019   Procedure: XI ROBOTIC PELVIC AND PARA-AORTIC LYMPH NODE DISSECTION;  Surgeon: Carver Fila, MD;  Location: [facility] ORS;  Service: Gynecology;  Laterality: N/A;   TEE WITHOUT CARDIOVERSION N/A 07/31/2019   Procedure: TRANSESOPHAGEAL ECHOCARDIOGRAM (TEE);  Surgeon: Lewayne Bunting, MD;  Location: [facility] ENDOSCOPY;  Service: Cardiovascular;  Laterality: N/A;    Social History:  reports that she  has never smoked. She has never used smokeless tobacco. She reports current alcohol use of about 1.0 standard drink of alcohol per week. She reports that she does not use drugs.  Allergies: Allergies  Allergen Reactions   Bee Venom Anaphylaxis, Swelling and Other (See Comments)   Aspartame Diarrhea   Aspartame And Phenylalanine Diarrhea   Adhesive [Tape] Other (See Comments)    Irritation  and red    Family History:  Family History  Problem Relation Age of Onset   Stroke Father    Alzheimer's disease Father    Hyperlipidemia Father    Prostate cancer Father    Cancer Father        in his nose cartiage   Deep vein thrombosis Sister    Other Sister        celiac sprue   Breast cancer Paternal Grandmother        In her 60s, deceased at 46; bilateral mastectomy    Arthritis Mother    Lung cancer Maternal Grandmother    Pancreatic cancer Maternal Grandmother        deceased in late 50s   Diabetes Paternal Grandfather    Pancreatic cancer Paternal Grandfather      Current Outpatient Medications:    ARMOUR THYROID 90 MG tablet, Take 90 mg by mouth every morning., Disp: , Rfl:    aspirin EC (ASPIR-LOW) 81 MG tablet, Take 81 mg by mouth daily., Disp: , Rfl:    cimetidine (TAGAMET) 400 MG tablet, Take 400 mg by mouth 2 (two) times daily. 03/23/2023 Only takes M-F., Disp: , Rfl:    dipyridamole (PERSANTINE) 75 MG tablet, Take 75 mg by mouth 2 (two) times daily., Disp: , Rfl:    doxycycline (MONODOX) 100 MG capsule, Take 100 mg by mouth 2 (two) times daily., Disp: , Rfl:    fondaparinux (ARIXTRA) 7.5 MG/0.6ML SOLN injection, INJECT 0.6 MLS INTO THE SKIN DAILY, Disp: 18 mL, Rfl: 4   lidocaine-prilocaine (EMLA) cream, APPLY TOPICALLY TO THE AFFECTED AREA 1 TIME, Disp: 30 g, Rfl: 4   metFORMIN (GLUCOPHAGE) 500 MG tablet, Take 500 mg by mouth 2 (two) times daily., Disp: , Rfl:    Naltrexone HCl, Pain, (NALTREX) 4.5 MG CAPS, Take by mouth at bedtime., Disp: , Rfl:    ondansetron (ZOFRAN) 8 MG tablet, Take 1 tablet (8 mg total) by mouth every 8 (eight) hours as needed for nausea or vomiting., Disp: 30 tablet, Rfl: 1   Probiotic Product (PROBIOTIC BLEND PO), Take by mouth daily., Disp: , Rfl:   Review of Systems:  Negative except as indicated in HPI.   Physical Exam: Vitals:   09/10/23 1439  BP: 120/80  Pulse: 74  Temp: 98.2 F (36.8 C)  TempSrc: Oral  SpO2: 98%   Weight: 127 lb 3.2 oz (57.7 kg)  Height: 5' 5.75" (1.67 m)   Body mass index is 20.69 kg/m.     Impression and Plan:  Acute deep vein thrombosis (DVT) of right lower extremity, unspecified vein (HCC)  Other acute pulmonary embolism without acute cor pulmonale (HCC)  Endometrial cancer (HCC)  Pelvic lymphadenopathy   -Chart reviewed in detail.  She only plans to check in with [place] once a year for her wellness visit, otherwise prefers to continue care with integrative medicine.  She also follows with Dr. Myna Hidalgo in regards to her oncological care.  Time spent: 31 minutes reviewing chart, interviewing and examining patient and formulating plan of care.     Chaya Jan, MD  [facility] at [facility]

## 2023-09-11 ENCOUNTER — Other Ambulatory Visit: Payer: Self-pay

## 2023-09-13 DIAGNOSIS — H35372 Puckering of macula, left eye: Secondary | ICD-10-CM | POA: Diagnosis not present

## 2023-09-13 DIAGNOSIS — H5213 Myopia, bilateral: Secondary | ICD-10-CM | POA: Diagnosis not present

## 2023-10-05 ENCOUNTER — Ambulatory Visit (HOSPITAL_COMMUNITY)
Admission: RE | Admit: 2023-10-05 | Discharge: 2023-10-05 | Disposition: A | Discharge status: Home / Self Care | Source: Ambulatory Visit | Attending: Hematology & Oncology | Admitting: Hematology & Oncology

## 2023-10-05 DIAGNOSIS — C541 Malignant neoplasm of endometrium: Secondary | ICD-10-CM | POA: Insufficient documentation

## 2023-10-05 DIAGNOSIS — M47816 Spondylosis without myelopathy or radiculopathy, lumbar region: Secondary | ICD-10-CM | POA: Diagnosis not present

## 2023-10-05 LAB — GLUCOSE, CAPILLARY: Glucose-Capillary: 91 mg/dL (ref 70–99)

## 2023-10-05 MED ORDER — HEPARIN SOD (PORK) LOCK FLUSH 100 UNIT/ML IV SOLN
500.0000 [IU] | Freq: Once | INTRAVENOUS | Status: AC
Start: 2023-10-05 — End: 2023-10-05
  Administered 2023-10-05: 500 [IU] via INTRAVENOUS
  Filled 2023-10-05: qty 5

## 2023-10-05 MED ORDER — FLUDEOXYGLUCOSE F - 18 (FDG) INJECTION
6.0700 | Freq: Once | INTRAVENOUS | Status: AC
Start: 2023-10-05 — End: 2023-10-05
  Administered 2023-10-05: 6.07 via INTRAVENOUS

## 2023-10-11 ENCOUNTER — Encounter: Payer: Self-pay | Admitting: Hematology & Oncology

## 2023-10-12 ENCOUNTER — Inpatient Hospital Stay (HOSPITAL_BASED_OUTPATIENT_CLINIC_OR_DEPARTMENT_OTHER): Admitting: Hematology & Oncology

## 2023-10-12 ENCOUNTER — Other Ambulatory Visit: Payer: Self-pay

## 2023-10-12 ENCOUNTER — Inpatient Hospital Stay

## 2023-10-12 ENCOUNTER — Telehealth: Payer: Self-pay | Admitting: *Deleted

## 2023-10-12 ENCOUNTER — Other Ambulatory Visit

## 2023-10-12 ENCOUNTER — Ambulatory Visit

## 2023-10-12 ENCOUNTER — Encounter: Payer: Self-pay | Admitting: Hematology & Oncology

## 2023-10-12 ENCOUNTER — Other Ambulatory Visit: Payer: Self-pay | Admitting: *Deleted

## 2023-10-12 ENCOUNTER — Ambulatory Visit: Admitting: Hematology & Oncology

## 2023-10-12 ENCOUNTER — Inpatient Hospital Stay: Attending: Hematology & Oncology

## 2023-10-12 VITALS — BP 104/47 | HR 80 | Temp 97.7°F | Resp 17 | Ht 66.0 in | Wt 123.0 lb

## 2023-10-12 DIAGNOSIS — I82401 Acute embolism and thrombosis of unspecified deep veins of right lower extremity: Secondary | ICD-10-CM

## 2023-10-12 DIAGNOSIS — Z452 Encounter for adjustment and management of vascular access device: Secondary | ICD-10-CM

## 2023-10-12 DIAGNOSIS — E063 Autoimmune thyroiditis: Secondary | ICD-10-CM

## 2023-10-12 DIAGNOSIS — Z5112 Encounter for antineoplastic immunotherapy: Secondary | ICD-10-CM | POA: Insufficient documentation

## 2023-10-12 DIAGNOSIS — Z8042 Family history of malignant neoplasm of prostate: Secondary | ICD-10-CM

## 2023-10-12 DIAGNOSIS — C541 Malignant neoplasm of endometrium: Secondary | ICD-10-CM | POA: Diagnosis present

## 2023-10-12 DIAGNOSIS — N200 Calculus of kidney: Secondary | ICD-10-CM

## 2023-10-12 DIAGNOSIS — Z800 Family history of malignant neoplasm of digestive organs: Secondary | ICD-10-CM

## 2023-10-12 DIAGNOSIS — Z803 Family history of malignant neoplasm of breast: Secondary | ICD-10-CM

## 2023-10-12 DIAGNOSIS — I6300 Cerebral infarction due to thrombosis of unspecified precerebral artery: Secondary | ICD-10-CM

## 2023-10-12 DIAGNOSIS — Z1379 Encounter for other screening for genetic and chromosomal anomalies: Secondary | ICD-10-CM

## 2023-10-12 DIAGNOSIS — Z7901 Long term (current) use of anticoagulants: Secondary | ICD-10-CM | POA: Diagnosis not present

## 2023-10-12 DIAGNOSIS — I2699 Other pulmonary embolism without acute cor pulmonale: Secondary | ICD-10-CM

## 2023-10-12 DIAGNOSIS — D649 Anemia, unspecified: Secondary | ICD-10-CM | POA: Diagnosis not present

## 2023-10-12 DIAGNOSIS — L900 Lichen sclerosus et atrophicus: Secondary | ICD-10-CM

## 2023-10-12 DIAGNOSIS — Z86718 Personal history of other venous thrombosis and embolism: Secondary | ICD-10-CM | POA: Insufficient documentation

## 2023-10-12 DIAGNOSIS — Z22322 Carrier or suspected carrier of Methicillin resistant Staphylococcus aureus: Secondary | ICD-10-CM

## 2023-10-12 DIAGNOSIS — R590 Localized enlarged lymph nodes: Secondary | ICD-10-CM

## 2023-10-12 DIAGNOSIS — Z8673 Personal history of transient ischemic attack (TIA), and cerebral infarction without residual deficits: Secondary | ICD-10-CM | POA: Insufficient documentation

## 2023-10-12 DIAGNOSIS — Z95828 Presence of other vascular implants and grafts: Secondary | ICD-10-CM

## 2023-10-12 DIAGNOSIS — Z7189 Other specified counseling: Secondary | ICD-10-CM

## 2023-10-12 DIAGNOSIS — Z79620 Long term (current) use of immunosuppressive biologic: Secondary | ICD-10-CM | POA: Diagnosis not present

## 2023-10-12 DIAGNOSIS — R898 Other abnormal findings in specimens from other organs, systems and tissues: Secondary | ICD-10-CM

## 2023-10-12 LAB — LACTATE DEHYDROGENASE: LDH: 150 U/L (ref 98–192)

## 2023-10-12 LAB — FERRITIN: Ferritin: 9 ng/mL — ABNORMAL LOW (ref 11–307)

## 2023-10-12 LAB — CBC WITH DIFFERENTIAL (CANCER CENTER ONLY)
Abs Immature Granulocytes: 0 10*3/uL (ref 0.00–0.07)
Basophils Absolute: 0 10*3/uL (ref 0.0–0.1)
Basophils Relative: 0 %
Eosinophils Absolute: 0 10*3/uL (ref 0.0–0.5)
Eosinophils Relative: 0 %
HCT: 21.9 % — ABNORMAL LOW (ref 36.0–46.0)
Hemoglobin: 6.7 g/dL — CL (ref 12.0–15.0)
Immature Granulocytes: 0 %
Lymphocytes Relative: 35 %
Lymphs Abs: 1 10*3/uL (ref 0.7–4.0)
MCH: 28.9 pg (ref 26.0–34.0)
MCHC: 30.6 g/dL (ref 30.0–36.0)
MCV: 94.4 fL (ref 80.0–100.0)
Monocytes Absolute: 0.3 10*3/uL (ref 0.1–1.0)
Monocytes Relative: 12 %
Neutro Abs: 1.5 10*3/uL — ABNORMAL LOW (ref 1.7–7.7)
Neutrophils Relative %: 53 %
Platelet Count: 362 10*3/uL (ref 150–400)
RBC: 2.32 MIL/uL — ABNORMAL LOW (ref 3.87–5.11)
RDW: 14.3 % (ref 11.5–15.5)
WBC Count: 2.8 10*3/uL — ABNORMAL LOW (ref 4.0–10.5)
nRBC: 0 % (ref 0.0–0.2)

## 2023-10-12 LAB — CMP (CANCER CENTER ONLY)
ALT: 15 U/L (ref 0–44)
AST: 17 U/L (ref 15–41)
Albumin: 4.4 g/dL (ref 3.5–5.0)
Alkaline Phosphatase: 64 U/L (ref 38–126)
Anion gap: 8 (ref 5–15)
BUN: 19 mg/dL (ref 8–23)
CO2: 25 mmol/L (ref 22–32)
Calcium: 8.7 mg/dL — ABNORMAL LOW (ref 8.9–10.3)
Chloride: 107 mmol/L (ref 98–111)
Creatinine: 0.74 mg/dL (ref 0.44–1.00)
GFR, Estimated: >60 mL/min (ref >60)
Glucose, Bld: 98 mg/dL (ref 70–99)
Potassium: 4.1 mmol/L (ref 3.5–5.1)
Sodium: 140 mmol/L (ref 135–145)
Total Bilirubin: 0.6 mg/dL (ref 0.0–1.2)
Total Protein: 6.1 g/dL — ABNORMAL LOW (ref 6.5–8.1)

## 2023-10-12 LAB — RETICULOCYTES
Immature Retic Fract: 20.5 % — ABNORMAL HIGH (ref 2.3–15.9)
RBC.: 2.3 MIL/uL — ABNORMAL LOW (ref 3.87–5.11)
Retic Count, Absolute: 92.5 10*3/uL (ref 19.0–186.0)
Retic Ct Pct: 4 % — ABNORMAL HIGH (ref 0.4–3.1)

## 2023-10-12 LAB — IRON AND IRON BINDING CAPACITY (CC-WL,HP ONLY)
Iron: 23 ug/dL — ABNORMAL LOW (ref 28–170)
Saturation Ratios: 5 % — ABNORMAL LOW (ref 10.4–31.8)
TIBC: 437 ug/dL (ref 250–450)
UIBC: 414 ug/dL (ref 148–442)

## 2023-10-12 LAB — TSH: TSH: 10.1 u[IU]/mL — ABNORMAL HIGH (ref 0.350–4.500)

## 2023-10-12 MED ORDER — SODIUM CHLORIDE 0.9% FLUSH
10.0000 mL | INTRAVENOUS | Status: DC | PRN
  Administered 2023-10-12: 10 mL via INTRAVENOUS

## 2023-10-12 MED ORDER — HEPARIN SOD (PORK) LOCK FLUSH 100 UNIT/ML IV SOLN
500.0000 [IU] | Freq: Once | INTRAVENOUS | Status: AC
Start: 2023-10-12 — End: 2023-10-12
  Administered 2023-10-12: 500 [IU] via INTRAVENOUS

## 2023-10-12 NOTE — Patient Instructions (Signed)

## 2023-10-12 NOTE — Progress Notes (Signed)
 Hematology and Oncology Follow Up Visit  Sherry Williams [id number] [birth year] 63 y.o. 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 is back for follow-up.  Unfortunately, I think we have a new issue.  She is quite pale.  She feels quite tired.  She does not have much energy.  This been going on for a couple weeks at least.  When we checked her labs today, her hemoglobin was down to 6.7..  She has not noted any melena or hematochezia.  There has been no hematemesis.  We did do a PET scan on her.  The PET scan was done on 10/05/2023.  PET scan did not show anything that looked like there was recurrent malignancy.  Her last CA-125 was 9.9..  She just had a Signatera test done which was 0.  Again, I am not sure why she would be so anemic.  I realize that she is on Arixtra  because of the history of CVA.  This could certainly complicate things.  I did do a rectal exam on her.  There is very little stool in the rectal vault.  However, when I could find was heme negative.  She has had no fever.  She has had no recent infections.  She has had no foreign travel.  She has had a decent appetite.  She has not changed any medications.  When I look at her blood under the microscope, illusory looks like she is iron deficient.  She has hypochromic and microcytic red blood cells.  She has no nucleated red blood cells.  I see no rouleaux formation.  There is no schistocytes.  There is no spherocytes.  We clearly are not can be able to treat her today with Keytruda .  She has had a directed blood  donation in the past.  She will only take this because of her personal beliefs.  She has had no syncope.  I forgot to mention that her father passed away recently.  I know this has been somewhat stressful for her.  She is helping her mom during this difficult time.  I would have to say that her performance status is probably ECOG 1.   Medications:  Current Outpatient Medications:    ARMOUR THYROID  90 MG tablet, Take 90 mg by mouth every morning., Disp: , Rfl:    aspirin EC (ASPIR-LOW) 81 MG tablet, Take 81 mg by mouth daily., Disp: , Rfl:    cimetidine (TAGAMET) 400 MG tablet, Take 400 mg by mouth 2 (two) times daily. 03/23/2023 Only takes M-F., Disp: , Rfl:    dipyridamole (PERSANTINE) 75 MG tablet, Take 75 mg by mouth 2 (two) times daily., Disp: , Rfl:    doxycycline (MONODOX) 100 MG capsule, Take 100 mg by mouth 2 (two) times daily., Disp: , Rfl:    fondaparinux  (ARIXTRA ) 7.5 MG/0.6ML SOLN injection, INJECT 0.6 MLS INTO THE SKIN DAILY, Disp: 18 mL, Rfl: 4   lidocaine -prilocaine  (EMLA ) cream, APPLY TOPICALLY TO THE AFFECTED AREA 1 TIME, Disp: 30 g, Rfl: 4   metFORMIN (GLUCOPHAGE) 500 MG tablet, Take  500 mg by mouth 2 (two) times daily., Disp: , Rfl:    Naltrexone HCl, Pain, (NALTREX) 4.5 MG CAPS, Take by mouth at bedtime., Disp: , Rfl:    ondansetron  (ZOFRAN ) 8 MG tablet, Take 1 tablet (8 mg total) by mouth every 8 (eight) hours as needed for nausea or vomiting., Disp: 30 tablet, Rfl: 1   Probiotic Product (PROBIOTIC BLEND PO), Take by mouth daily., Disp: , Rfl:   Allergies:  Allergies  Allergen Reactions   Bee Venom Anaphylaxis, Swelling and Other (See Comments)   Aspartame Diarrhea   Aspartame And Phenylalanine Diarrhea   Adhesive [Tape] Other (See Comments)    Irritation and red    Past Medical History, Surgical history, Social history, and Family History were reviewed and updated.  Review of Systems: Review of Systems  Constitutional: Negative.   HENT: Negative.    Eyes:  Negative.   Respiratory: Negative.    Cardiovascular: Negative.   Gastrointestinal:  Positive for abdominal pain.  Genitourinary: Negative.   Musculoskeletal: Negative.   Skin: Negative.   Neurological: Negative.   Endo/Heme/Allergies: Negative.   Psychiatric/Behavioral: Negative.      Physical Exam: Her vital signs show temperature of 97.7.  Pulse 92.  Blood pressure 104/47.  Weight is 123 pounds.  .    Wt Readings from Last 3 Encounters:  09/10/23 127 lb 3.2 oz (57.7 kg)  08/31/23 124 lb (56.2 kg)  07/20/23 118 lb (53.5 kg)    Physical Exam Vitals reviewed.  HENT:     Head: Normocephalic and atraumatic.  Eyes:     Pupils: Pupils are equal, round, and reactive to light.  Cardiovascular:     Rate and Rhythm: Normal rate and regular rhythm.     Heart sounds: Normal heart sounds.  Pulmonary:     Effort: Pulmonary effort is normal.     Breath sounds: Normal breath sounds.  Abdominal:     General: Bowel sounds are normal.     Palpations: Abdomen is soft.     Comments: Abdominal exam shows a soft abdomen.  She has well healing laparoscopic scars.  I think there are 5 laparoscopic scars.  She has no erythema or exudate or warmth associated with these.  She has no fluid wave in the abdomen.  There is no guarding or rebound tenderness.  She has no palpable liver or spleen tip.  Musculoskeletal:        General: No tenderness or deformity. Normal range of motion.     Cervical back: Normal range of motion.  Lymphadenopathy:     Cervical: No cervical adenopathy.  Skin:    General: Skin is warm and dry.     Findings: No erythema or rash.  Neurological:     Mental Status: She is alert and oriented to person, place, and time.  Psychiatric:        Behavior: Behavior normal.        Thought Content: Thought content normal.        Judgment: Judgment normal.     Lab Results  Component Value Date   WBC 2.8 (L) 10/12/2023   HGB 6.7 (LL) 10/12/2023   HCT 21.9 (L) 10/12/2023   MCV  94.4 10/12/2023   PLT 362 10/12/2023     Chemistry      Component Value Date/Time   NA 140 08/31/2023 0920   NA 142 02/15/2017 1518   NA 140 04/13/2016 1055   K 4.2 08/31/2023 0920   K 4.1 02/15/2017 1518  K 4.1 04/13/2016 1055   CL 104 08/31/2023 0920   CL 107 02/15/2017 1518   CO2 27 08/31/2023 0920   CO2 29 02/15/2017 1518   CO2 27 04/13/2016 1055   BUN 22 08/31/2023 0920   BUN 17 02/15/2017 1518   BUN 22.1 04/13/2016 1055   CREATININE 0.67 08/31/2023 0920   CREATININE 1.0 02/15/2017 1518   CREATININE 0.8 04/13/2016 1055      Component Value Date/Time   CALCIUM  9.1 08/31/2023 0920   CALCIUM  9.3 02/15/2017 1518   CALCIUM  9.9 04/13/2016 1055   ALKPHOS 72 08/31/2023 0920   ALKPHOS 79 02/15/2017 1518   ALKPHOS 81 04/13/2016 1055   AST 17 08/31/2023 0920   AST 20 04/13/2016 1055   ALT 13 08/31/2023 0920   ALT 24 02/15/2017 1518   ALT 22 04/13/2016 1055   BILITOT 0.8 08/31/2023 0920   BILITOT 0.44 04/13/2016 1055      Impression and Plan: Ms. Deininger is a 63 year old Caucasian female.  She has had another recurrence of her gynecologic malignancy.  We gave her chemotherapy.  She did incredibly well with chemotherapy.  She now is on maintenance immunotherapy.  I am just very happy that she is done so well.  The last PET scan showed that she has done incredibly well.  She is in remission.  Again this incredibly low hemoglobin is very troublesome to me.  I have to believe that she is bleeding despite the negative Hemoccult..  I did give her 3 Hemoccult cards to take home with her.  Again we will see what her iron studies look like.  Again her retake count when corrected is low.  As such, I do not think she is hemolyzing.  I cannot imagine this is anything related to her immunotherapy.  This is very complicated.  We are have to follow her up closely.  Will have to have her come back on Monday so we can see what her lab work looks like.  She is supposed bring in the  Hemoccult cards.  Again she will only take blood from friends who have not had the COVID-vaccine.  We certainly will honor that request.  However, it will be several days before we could even consider transfusing her with that blood.  If her iron is low, then we will certainly give her some IV iron.  She certainly knows if she has any problems over the weekend that she can call us .  Ivor Mars, MD 5/2/202511:47 AM

## 2023-10-12 NOTE — Telephone Encounter (Signed)
 Dean Every from [facility] ER lab gave me a critical HGB of 6.7. Results given to MD.

## 2023-10-13 DIAGNOSIS — D649 Anemia, unspecified: Secondary | ICD-10-CM | POA: Diagnosis not present

## 2023-10-13 DIAGNOSIS — Z7901 Long term (current) use of anticoagulants: Secondary | ICD-10-CM | POA: Diagnosis not present

## 2023-10-13 DIAGNOSIS — Z8673 Personal history of transient ischemic attack (TIA), and cerebral infarction without residual deficits: Secondary | ICD-10-CM | POA: Diagnosis not present

## 2023-10-13 DIAGNOSIS — Z5112 Encounter for antineoplastic immunotherapy: Secondary | ICD-10-CM | POA: Diagnosis not present

## 2023-10-13 DIAGNOSIS — Z86718 Personal history of other venous thrombosis and embolism: Secondary | ICD-10-CM | POA: Diagnosis not present

## 2023-10-13 DIAGNOSIS — Z79620 Long term (current) use of immunosuppressive biologic: Secondary | ICD-10-CM | POA: Diagnosis not present

## 2023-10-13 LAB — T4: T4, Total: 4.1 ug/dL — ABNORMAL LOW (ref 4.5–12.0)

## 2023-10-13 LAB — CA 125: Cancer Antigen (CA) 125: 9.1 U/mL (ref 0.0–38.1)

## 2023-10-15 ENCOUNTER — Telehealth: Payer: Self-pay

## 2023-10-15 ENCOUNTER — Inpatient Hospital Stay

## 2023-10-15 ENCOUNTER — Other Ambulatory Visit: Payer: Self-pay | Admitting: *Deleted

## 2023-10-15 ENCOUNTER — Other Ambulatory Visit: Payer: Self-pay

## 2023-10-15 ENCOUNTER — Other Ambulatory Visit: Payer: Self-pay | Admitting: Family

## 2023-10-15 ENCOUNTER — Encounter: Payer: Self-pay | Admitting: Hematology & Oncology

## 2023-10-15 VITALS — BP 104/46 | HR 68 | Resp 20

## 2023-10-15 DIAGNOSIS — C541 Malignant neoplasm of endometrium: Secondary | ICD-10-CM

## 2023-10-15 DIAGNOSIS — D649 Anemia, unspecified: Secondary | ICD-10-CM | POA: Diagnosis not present

## 2023-10-15 DIAGNOSIS — Z86718 Personal history of other venous thrombosis and embolism: Secondary | ICD-10-CM | POA: Diagnosis not present

## 2023-10-15 DIAGNOSIS — D509 Iron deficiency anemia, unspecified: Secondary | ICD-10-CM

## 2023-10-15 DIAGNOSIS — Z7901 Long term (current) use of anticoagulants: Secondary | ICD-10-CM | POA: Diagnosis not present

## 2023-10-15 DIAGNOSIS — Z8673 Personal history of transient ischemic attack (TIA), and cerebral infarction without residual deficits: Secondary | ICD-10-CM | POA: Diagnosis not present

## 2023-10-15 DIAGNOSIS — Z5112 Encounter for antineoplastic immunotherapy: Secondary | ICD-10-CM | POA: Diagnosis not present

## 2023-10-15 DIAGNOSIS — Z79620 Long term (current) use of immunosuppressive biologic: Secondary | ICD-10-CM | POA: Diagnosis not present

## 2023-10-15 LAB — CBC WITH DIFFERENTIAL (CANCER CENTER ONLY)
Abs Immature Granulocytes: 0.01 10*3/uL (ref 0.00–0.07)
Basophils Absolute: 0 10*3/uL (ref 0.0–0.1)
Basophils Relative: 1 %
Eosinophils Absolute: 0 10*3/uL (ref 0.0–0.5)
Eosinophils Relative: 0 %
HCT: 22 % — ABNORMAL LOW (ref 36.0–46.0)
Hemoglobin: 6.7 g/dL — CL (ref 12.0–15.0)
Immature Granulocytes: 0 %
Lymphocytes Relative: 31 %
Lymphs Abs: 0.9 10*3/uL (ref 0.7–4.0)
MCH: 28.3 pg (ref 26.0–34.0)
MCHC: 30.5 g/dL (ref 30.0–36.0)
MCV: 92.8 fL (ref 80.0–100.0)
Monocytes Absolute: 0.3 10*3/uL (ref 0.1–1.0)
Monocytes Relative: 11 %
Neutro Abs: 1.7 10*3/uL (ref 1.7–7.7)
Neutrophils Relative %: 57 %
Platelet Count: 344 10*3/uL (ref 150–400)
RBC: 2.37 MIL/uL — ABNORMAL LOW (ref 3.87–5.11)
RDW: 15.1 % (ref 11.5–15.5)
WBC Count: 3 10*3/uL — ABNORMAL LOW (ref 4.0–10.5)
nRBC: 0 % (ref 0.0–0.2)

## 2023-10-15 LAB — OCCULT BLOOD X 1 CARD TO LAB, STOOL
Fecal Occult Bld: NEGATIVE
Fecal Occult Bld: NEGATIVE
Fecal Occult Bld: NEGATIVE

## 2023-10-15 MED ORDER — HEPARIN SOD (PORK) LOCK FLUSH 100 UNIT/ML IV SOLN
500.0000 [IU] | Freq: Once | INTRAVENOUS | Status: AC
Start: 2023-10-15 — End: 2023-10-15
  Administered 2023-10-15: 500 [IU] via INTRAVENOUS

## 2023-10-15 MED ORDER — SODIUM CHLORIDE 0.9 % IV SOLN
510.0000 mg | Freq: Once | INTRAVENOUS | Status: AC
  Administered 2023-10-15: 510 mg via INTRAVENOUS
  Filled 2023-10-15: qty 17

## 2023-10-15 MED ORDER — SODIUM CHLORIDE 0.9% FLUSH
10.0000 mL | INTRAVENOUS | Status: DC | PRN
  Administered 2023-10-15: 10 mL via INTRAVENOUS

## 2023-10-15 MED ORDER — SODIUM CHLORIDE 0.9 % IV SOLN: INTRAVENOUS | Status: DC

## 2023-10-15 NOTE — Patient Instructions (Signed)

## 2023-10-15 NOTE — Telephone Encounter (Signed)
 Critical HGB of 6.7 received from lab, MD notified. Patient scheduled for blood transfusion this week.

## 2023-10-16 ENCOUNTER — Other Ambulatory Visit: Payer: Self-pay

## 2023-10-16 DIAGNOSIS — D509 Iron deficiency anemia, unspecified: Secondary | ICD-10-CM

## 2023-10-17 ENCOUNTER — Inpatient Hospital Stay

## 2023-10-17 ENCOUNTER — Other Ambulatory Visit: Payer: Self-pay

## 2023-10-17 ENCOUNTER — Telehealth: Payer: Self-pay

## 2023-10-17 VITALS — BP 91/64 | HR 70 | Temp 98.2°F | Resp 20

## 2023-10-17 DIAGNOSIS — C541 Malignant neoplasm of endometrium: Secondary | ICD-10-CM

## 2023-10-17 DIAGNOSIS — D509 Iron deficiency anemia, unspecified: Secondary | ICD-10-CM

## 2023-10-17 DIAGNOSIS — Z86718 Personal history of other venous thrombosis and embolism: Secondary | ICD-10-CM | POA: Diagnosis not present

## 2023-10-17 DIAGNOSIS — D649 Anemia, unspecified: Secondary | ICD-10-CM | POA: Diagnosis not present

## 2023-10-17 DIAGNOSIS — Z7901 Long term (current) use of anticoagulants: Secondary | ICD-10-CM | POA: Diagnosis not present

## 2023-10-17 DIAGNOSIS — Z5112 Encounter for antineoplastic immunotherapy: Secondary | ICD-10-CM | POA: Diagnosis not present

## 2023-10-17 DIAGNOSIS — Z8673 Personal history of transient ischemic attack (TIA), and cerebral infarction without residual deficits: Secondary | ICD-10-CM | POA: Diagnosis not present

## 2023-10-17 DIAGNOSIS — Z79620 Long term (current) use of immunosuppressive biologic: Secondary | ICD-10-CM | POA: Diagnosis not present

## 2023-10-17 LAB — SAMPLE TO BLOOD BANK

## 2023-10-17 LAB — CBC WITH DIFFERENTIAL (CANCER CENTER ONLY)
Abs Immature Granulocytes: 0.02 10*3/uL (ref 0.00–0.07)
Basophils Absolute: 0 10*3/uL (ref 0.0–0.1)
Basophils Relative: 0 %
Eosinophils Absolute: 0 10*3/uL (ref 0.0–0.5)
Eosinophils Relative: 0 %
HCT: 20.4 % — ABNORMAL LOW (ref 36.0–46.0)
Hemoglobin: 6.2 g/dL — CL (ref 12.0–15.0)
Immature Granulocytes: 1 %
Lymphocytes Relative: 30 %
Lymphs Abs: 0.9 10*3/uL (ref 0.7–4.0)
MCH: 28.7 pg (ref 26.0–34.0)
MCHC: 30.4 g/dL (ref 30.0–36.0)
MCV: 94.4 fL (ref 80.0–100.0)
Monocytes Absolute: 0.3 10*3/uL (ref 0.1–1.0)
Monocytes Relative: 10 %
Neutro Abs: 1.7 10*3/uL (ref 1.7–7.7)
Neutrophils Relative %: 59 %
Platelet Count: 345 10*3/uL (ref 150–400)
RBC: 2.16 MIL/uL — ABNORMAL LOW (ref 3.87–5.11)
RDW: 15.5 % (ref 11.5–15.5)
WBC Count: 2.9 10*3/uL — ABNORMAL LOW (ref 4.0–10.5)
nRBC: 0 % (ref 0.0–0.2)

## 2023-10-17 LAB — PREPARE RBC (CROSSMATCH)

## 2023-10-17 MED ORDER — SODIUM CHLORIDE 0.9% FLUSH: 10.0000 mL | INTRAVENOUS | Status: DC | PRN

## 2023-10-17 MED ORDER — HEPARIN SOD (PORK) LOCK FLUSH 100 UNIT/ML IV SOLN: 500.0000 [IU] | Freq: Once | INTRAVENOUS | Status: DC

## 2023-10-17 NOTE — Patient Instructions (Signed)

## 2023-10-17 NOTE — Telephone Encounter (Signed)
 Dr Maria Shiner aware of critical hgb 6.2. patient scheduled for 2 units PRBC tomorrow. dph

## 2023-10-18 ENCOUNTER — Telehealth: Payer: Self-pay

## 2023-10-18 ENCOUNTER — Other Ambulatory Visit: Payer: Self-pay

## 2023-10-18 ENCOUNTER — Inpatient Hospital Stay

## 2023-10-18 DIAGNOSIS — C541 Malignant neoplasm of endometrium: Secondary | ICD-10-CM

## 2023-10-18 DIAGNOSIS — Z8673 Personal history of transient ischemic attack (TIA), and cerebral infarction without residual deficits: Secondary | ICD-10-CM | POA: Diagnosis not present

## 2023-10-18 DIAGNOSIS — Z86718 Personal history of other venous thrombosis and embolism: Secondary | ICD-10-CM | POA: Diagnosis not present

## 2023-10-18 DIAGNOSIS — Z79620 Long term (current) use of immunosuppressive biologic: Secondary | ICD-10-CM | POA: Diagnosis not present

## 2023-10-18 DIAGNOSIS — D509 Iron deficiency anemia, unspecified: Secondary | ICD-10-CM

## 2023-10-18 DIAGNOSIS — Z7901 Long term (current) use of anticoagulants: Secondary | ICD-10-CM | POA: Diagnosis not present

## 2023-10-18 DIAGNOSIS — D649 Anemia, unspecified: Secondary | ICD-10-CM | POA: Diagnosis not present

## 2023-10-18 DIAGNOSIS — Z5112 Encounter for antineoplastic immunotherapy: Secondary | ICD-10-CM | POA: Diagnosis not present

## 2023-10-18 MED ORDER — NA SULFATE-K SULFATE-MG SULF 17.5-3.13-1.6 GM/177ML PO SOLN: 1.0000 | ORAL | 0 refills | Status: DC

## 2023-10-18 NOTE — Patient Instructions (Signed)
 Blood Transfusion, Adult A blood transfusion is a procedure in which you receive blood or a type of blood cell (blood component) through an IV. You may need a blood transfusion when you have a low blood count, which is a low number of any blood cell. This may result from a bleeding disorder, illness, injury, or surgery. The blood may come from a donor, or you may be able to have your own blood collected and stored (autologous blood donation) before a planned surgery. The blood given in a transfusion may be made up of different blood components. You may receive: Red blood cells. These carry oxygen to the cells in the body. Platelets. These help your blood to clot. Plasma. This is the liquid part of your blood. It carries proteins and other substances throughout the body. White blood cells. These help you fight infections. If you have hemophilia or another clotting disorder, you may also receive other types of blood products. Depending on the type of blood product, this procedure may take 1-4 hours to complete. Tell a health care provider about: Any bleeding problems you have. Any previous reactions you have had during a blood transfusion. Any allergies you have. All medicines you are taking, including vitamins, herbs, eye drops, creams, and over-the-counter medicines. Any surgeries you have had. Any medical conditions you have. Whether you are pregnant or may be pregnant. What are the risks? Talk with your health care provider about risks. The most common problems include: A mild allergic reaction, such as red, swollen areas of skin (hives) and itching. Fever or chills. This may be the body's response to new blood cells received. This may occur during or up to 4 hours after the transfusion. More serious problems may include: A serious allergic reaction that causes difficulty breathing or swelling around the face and lips. Transfusion-associated circulatory overload (TACO), or too much fluid in  the lungs. This may cause breathing problems. Transfusion-related acute lung injury (TRALI), which causes breathing difficulty and low oxygen in the blood. This can occur within hours of the transfusion or several days later. Iron overload. This can happen after receiving many blood transfusions over a period of time. Infection or virus being transmitted. This is rare because donated blood is carefully tested before it is given. Hemolytic transfusion reaction. This is rare. It happens when the body's defense system (immune system)tries to attack the new blood cells. Symptoms may include fever, chills, nausea, low blood pressure, and low back or chest pain. Transfusion-associated graft-versus-host disease (TAGVHD). This is rare. It happens when donated cells attack the body's healthy tissues. What happens before the procedure? You will have a blood test to check your blood type. This test is done to know what kind of blood your body will accept and to match it to the donor blood. If you are going to have a planned surgery, you may be able to do an autologous blood donation. This may be done in case you need to have a transfusion. You will have your temperature, blood pressure, and pulse checked before the transfusion. If you have had an allergic reaction to a transfusion in the past, you may be given medicine to help prevent a reaction. This medicine may be given to you by mouth (orally) or through an IV. What happens during the procedure?  An IV will be inserted into one of your veins. The bag of blood will be attached to your IV. The blood will then enter through your vein. Your temperature, blood pressure,  and pulse will be monitored during the transfusion. This monitoring is done to detect early signs of a transfusion reaction. Tell your nurse right away if you have any of these symptoms during the transfusion: Shortness of breath or trouble breathing. Chest or back pain. Fever or  chills. Itching or hives. If you have any signs or symptoms of a reaction, your transfusion will be stopped and you may be given medicine. When the transfusion is complete, your IV will be removed. Pressure may be applied to the IV site for a few minutes. A bandage (dressing)will be applied. The procedure may vary among health care providers and hospitals. What happens after the procedure? Your temperature, blood pressure, pulse, breathing rate, and blood oxygen level will be monitored until you leave the hospital or clinic. Your blood may be tested to see how you have responded to the transfusion. You may be warmed with fluids or blankets to maintain a normal body temperature. If you receive your blood transfusion in an outpatient setting, you will be told whom to contact to report any reactions. Where to find more information Visit the [employer]: redcross.org Summary A blood transfusion is a procedure in which you receive blood or a type of blood cell (blood component) through an IV. The blood given in a transfusion may be made up of different blood components. You may receive red blood cells, platelets, plasma, or white blood cells depending on the condition treated. Your temperature, blood pressure, and pulse will be monitored before, during, and after the transfusion. After the transfusion, your blood may be tested to see how your body has responded. This information is not intended to replace advice given to you by your health care provider. Make sure you discuss any questions you have with your health care provider. Document Revised: 08/26/2021 Document Reviewed: 08/26/2021 Elsevier Patient Education  2024 [employer].

## 2023-10-18 NOTE — Telephone Encounter (Signed)
 Called patient to discuss plan to proceed with EGD/Colon per Dr Karene Oto.  Patient stated that she would rather proceed with procedures without an appointment with an APP, as her father has passed away and she has family in from out of town and the funeral.  Patient advised she is scheduled for [facility] on 10-24-23.  Suprep sent to pharmacy.  Amb referral entered.  Instructions sent to patient via MyChart.   Patient is currently having an iron infusion and will have levels rechecked on 10-23-23.  Will review results to verify that hemoglobin has increased and patient can proceed with procedures.  Patient agreed to plan and verbalized understanding.

## 2023-10-18 NOTE — Telephone Encounter (Signed)
-----   Message from Annis Kinder sent at 10/17/2023  3:31 PM EDT ----- Received a call from Dr. Maria Shiner.  Patient with history of metastatic uterine cancer (currently under control and on immunotherapy) presenting with profound iron deficiency anemia with hemoglobin 6.  Heme negative stools.  Otherwise HD stable, and he does not feel that she needs hospital admission.  Does need expedited appointment with us .  Can you please call and overbook appointment with me sometime next week, along with EGD/colonoscopy sometime over the next 2-3 weeks or so (looks like I have some new openings on 5/14 that you can use).  If there is an opening with one of the APP's sooner, that is perfectly fine as well.  And if there really is no clinic opening before 5/14, honestly it is perfectly fine to schedule her with me directly without need for OV first since she will already have the heads up from Dr. Maria Shiner that she needs EGD/colonoscopy for profound IDA.  The procedures take precedence.  Please wait to place call tomorrow (Dr. Maria Shiner needs a chance to see if she is willing to proceed).  Thanks.

## 2023-10-19 ENCOUNTER — Encounter: Payer: Self-pay | Admitting: Gastroenterology

## 2023-10-19 LAB — TYPE AND SCREEN
ABO/RH(D): O POS
Antibody Screen: NEGATIVE
Unit division: 0
Unit division: 0

## 2023-10-19 LAB — BPAM RBC
Blood Product Expiration Date: 202506132359
Blood Product Expiration Date: 202506142359
ISSUE DATE / TIME: 202505080731
ISSUE DATE / TIME: 202505080731
Unit Type and Rh: 5100
Unit Type and Rh: 9500

## 2023-10-22 ENCOUNTER — Inpatient Hospital Stay

## 2023-10-22 ENCOUNTER — Other Ambulatory Visit: Payer: Self-pay | Admitting: *Deleted

## 2023-10-22 ENCOUNTER — Encounter: Payer: Self-pay | Admitting: Gastroenterology

## 2023-10-22 VITALS — BP 102/45 | HR 61 | Temp 98.3°F | Resp 18

## 2023-10-22 DIAGNOSIS — D649 Anemia, unspecified: Secondary | ICD-10-CM | POA: Diagnosis not present

## 2023-10-22 DIAGNOSIS — C541 Malignant neoplasm of endometrium: Secondary | ICD-10-CM

## 2023-10-22 DIAGNOSIS — D509 Iron deficiency anemia, unspecified: Secondary | ICD-10-CM

## 2023-10-22 DIAGNOSIS — Z7901 Long term (current) use of anticoagulants: Secondary | ICD-10-CM | POA: Diagnosis not present

## 2023-10-22 DIAGNOSIS — Z86718 Personal history of other venous thrombosis and embolism: Secondary | ICD-10-CM | POA: Diagnosis not present

## 2023-10-22 DIAGNOSIS — Z79620 Long term (current) use of immunosuppressive biologic: Secondary | ICD-10-CM | POA: Diagnosis not present

## 2023-10-22 DIAGNOSIS — Z8673 Personal history of transient ischemic attack (TIA), and cerebral infarction without residual deficits: Secondary | ICD-10-CM | POA: Diagnosis not present

## 2023-10-22 DIAGNOSIS — Z5112 Encounter for antineoplastic immunotherapy: Secondary | ICD-10-CM | POA: Diagnosis not present

## 2023-10-22 LAB — CMP (CANCER CENTER ONLY)
ALT: 16 U/L (ref 0–44)
AST: 20 U/L (ref 15–41)
Albumin: 4.8 g/dL (ref 3.5–5.0)
Alkaline Phosphatase: 68 U/L (ref 38–126)
Anion gap: 12 (ref 5–15)
BUN: 17 mg/dL (ref 8–23)
CO2: 25 mmol/L (ref 22–32)
Calcium: 8.9 mg/dL (ref 8.9–10.3)
Chloride: 105 mmol/L (ref 98–111)
Creatinine: 0.7 mg/dL (ref 0.44–1.00)
GFR, Estimated: >60 mL/min (ref >60)
Glucose, Bld: 108 mg/dL — ABNORMAL HIGH (ref 70–99)
Potassium: 3.9 mmol/L (ref 3.5–5.1)
Sodium: 142 mmol/L (ref 135–145)
Total Bilirubin: 0.5 mg/dL (ref 0.0–1.2)
Total Protein: 6.4 g/dL — ABNORMAL LOW (ref 6.5–8.1)

## 2023-10-22 LAB — CBC WITH DIFFERENTIAL (CANCER CENTER ONLY)
Abs Immature Granulocytes: 0.01 10*3/uL (ref 0.00–0.07)
Basophils Absolute: 0 10*3/uL (ref 0.0–0.1)
Basophils Relative: 1 %
Eosinophils Absolute: 0 10*3/uL (ref 0.0–0.5)
Eosinophils Relative: 0 %
HCT: 35.9 % — ABNORMAL LOW (ref 36.0–46.0)
Hemoglobin: 11.2 g/dL — ABNORMAL LOW (ref 12.0–15.0)
Immature Granulocytes: 0 %
Lymphocytes Relative: 31 %
Lymphs Abs: 1.1 10*3/uL (ref 0.7–4.0)
MCH: 29.3 pg (ref 26.0–34.0)
MCHC: 31.2 g/dL (ref 30.0–36.0)
MCV: 94 fL (ref 80.0–100.0)
Monocytes Absolute: 0.3 10*3/uL (ref 0.1–1.0)
Monocytes Relative: 9 %
Neutro Abs: 2.1 10*3/uL (ref 1.7–7.7)
Neutrophils Relative %: 59 %
Platelet Count: 297 10*3/uL (ref 150–400)
RBC: 3.82 MIL/uL — ABNORMAL LOW (ref 3.87–5.11)
RDW: 18.3 % — ABNORMAL HIGH (ref 11.5–15.5)
WBC Count: 3.5 10*3/uL — ABNORMAL LOW (ref 4.0–10.5)
nRBC: 0 % (ref 0.0–0.2)

## 2023-10-22 MED ORDER — SODIUM CHLORIDE 0.9 % IV SOLN
INTRAVENOUS | Status: DC
Start: 2023-10-22 — End: 2023-10-22

## 2023-10-22 MED ORDER — SODIUM CHLORIDE 0.9 % IV SOLN
510.0000 mg | Freq: Once | INTRAVENOUS | Status: AC
  Administered 2023-10-22: 510 mg via INTRAVENOUS
  Filled 2023-10-22: qty 17

## 2023-10-22 NOTE — Patient Instructions (Signed)

## 2023-10-23 ENCOUNTER — Ambulatory Visit

## 2023-10-23 ENCOUNTER — Other Ambulatory Visit

## 2023-10-23 NOTE — Telephone Encounter (Addendum)
 Patient aware that she can proceed with EGD/Colon scheduled for 10-24-23 with Hgb of 11.2.  Patient agreed to plan and verbalized understanding.  No further questions.

## 2023-10-24 ENCOUNTER — Encounter: Payer: Self-pay | Admitting: Gastroenterology

## 2023-10-24 ENCOUNTER — Ambulatory Visit: Admitting: Gastroenterology

## 2023-10-24 VITALS — BP 106/73 | HR 62 | Temp 98.2°F | Resp 14 | Ht 66.0 in | Wt 115.0 lb

## 2023-10-24 DIAGNOSIS — K635 Polyp of colon: Secondary | ICD-10-CM | POA: Diagnosis not present

## 2023-10-24 DIAGNOSIS — D125 Benign neoplasm of sigmoid colon: Secondary | ICD-10-CM | POA: Diagnosis not present

## 2023-10-24 DIAGNOSIS — D509 Iron deficiency anemia, unspecified: Secondary | ICD-10-CM

## 2023-10-24 DIAGNOSIS — K648 Other hemorrhoids: Secondary | ICD-10-CM

## 2023-10-24 DIAGNOSIS — K641 Second degree hemorrhoids: Secondary | ICD-10-CM

## 2023-10-24 MED ORDER — SODIUM CHLORIDE 0.9 % IV SOLN: 500.0000 mL | INTRAVENOUS | Status: DC

## 2023-10-24 NOTE — Patient Instructions (Addendum)
 Resume regular diet Continue present medications Await pathology results Return to GI clinic at appointment to be scheduled Resume Arixtra  tomorrow. Can resume antiplatelet therapy tomorrow as well. Continue follow up in the Hematology/Oncology clinic with serial CBC checks and repeat iron panel. See handouts for polyps and hemorrhoids  YOU HAD AN ENDOSCOPIC PROCEDURE TODAY AT THE [facility]:   Refer to the procedure report that was given to you for any specific questions about what was found during the examination.  If the procedure report does not answer your questions, please call your gastroenterologist to clarify.  If you requested that your care partner not be given the details of your procedure findings, then the procedure report has been included in a sealed envelope for you to review at your convenience later.  YOU SHOULD EXPECT: Some feelings of bloating in the abdomen. Passage of more gas than usual.  Walking can help get rid of the air that was put into your GI tract during the procedure and reduce the bloating. If you had a lower endoscopy (such as a colonoscopy or flexible sigmoidoscopy) you may notice spotting of blood in your stool or on the toilet paper. If you underwent a bowel prep for your procedure, you may not have a normal bowel movement for a few days.  Please Note:  You might notice some irritation and congestion in your nose or some drainage.  This is from the oxygen used during your procedure.  There is no need for concern and it should clear up in a day or so.  SYMPTOMS TO REPORT IMMEDIATELY:  Following lower endoscopy (colonoscopy or flexible sigmoidoscopy):  Excessive amounts of blood in the stool  Significant tenderness or worsening of abdominal pains  Swelling of the abdomen that is new, acute  Fever of 100F or higher  Following upper endoscopy (EGD)  Vomiting of blood or coffee ground material  New chest pain or pain under the shoulder  blades  Painful or persistently difficult swallowing  New shortness of breath  Black, tarry-looking stools  For urgent or emergent issues, a gastroenterologist can be reached at any hour by calling [phone]. Do not use MyChart messaging for urgent concerns.   DIET:  We do recommend a small meal at first, but then you may proceed to your regular diet.  Drink plenty of fluids but you should avoid alcoholic beverages for 24 hours.  ACTIVITY:  You should plan to take it easy for the rest of today and you should NOT DRIVE or use heavy machinery until tomorrow (because of the sedation medicines used during the test).    FOLLOW UP: Our staff will call the number listed on your records the next business day following your procedure.  We will call around 7:15- 8:00 am to check on you and address any questions or concerns that you may have regarding the information given to you following your procedure. If we do not reach you, we will leave a message.     If any biopsies were taken you will be contacted by phone or by letter within the next 1-3 weeks.  Please call us  at [phone] if you have not heard about the biopsies in 3 weeks.   SIGNATURES/CONFIDENTIALITY: You and/or your care partner have signed paperwork which will be entered into your electronic medical record.  These signatures attest to the fact that that the information above on your After Visit Summary has been reviewed and is understood.  Full responsibility of the  confidentiality of this discharge information lies with you and/or your care-partner.

## 2023-10-24 NOTE — Progress Notes (Signed)
 GASTROENTEROLOGY PROCEDURE H&P NOTE   Primary Care Physician: Zilphia Hilt, Charyl Coppersmith, MD    Reason for Procedure:  Iron deficiency anemia  Plan:    EGD, colonoscopy  Unfortunately, she still took her Arixtra  injection this morning, so she is still fully anticoagulated.  Otherwise holding her aspirin and dipyridamole for the last week.  Discussed how that may limit some of our diagnostic options today.  We can certainly still evaluate for potential source of bleeding, but intervention options can be limited as well.  Will be unlikely to perform any polypectomy, and will likely avoid any nonessential biopsies as well given the active anticoagulation.  She states she would not want any biopsies that are not absolutely critical anyway.  We did agree that any large polyps that may have risk of bleeding could potentially be resected with elective prophylactic clipping.  Knowing that this could be largely diagnostic, given her profound iron deficiency anemia, the fact that she has completed a bowel preparation, she would like to proceed as scheduled.  Patient is appropriate for endoscopic procedure(s) in the ambulatory ([facility]) setting.  The nature of the procedure, as well as the risks, benefits, and alternatives were carefully and thoroughly reviewed with the patient. Ample time for discussion and questions allowed. The patient understood, was satisfied, and agreed to proceed.     HPI: Sherry Williams is a 63 y.o. female who presents for EGD and colonoscopy for evaluation of iron deficiency anemia.  She has a history of metastatic uterine carcinoma, currently treated with maintenance immunotherapy (completed chemotherapy), along with history of DVT, multi-infarct CVA (on ASA 81 mg, dipyridamole, and Arixtra ).  She was evaluated by Dr. Maria Shiner in the Oncology Clinic on 10/12/2023 and noted to have profound iron deficiency anemia and referred for expedited EGD/colonoscopy for diagnostic and  therapeutic intent.  She is otherwise without overt bleeding.  Labs on 10/12/2023 n/f H/H 6.7/22 with MCV/RDW 94/14.  FOBT negative.  Ferritin 9, iron 23, TIBC 437, sat 5%.  Repeat 2 days later was 6.2/20.4 and she was given 2 units RBC transfusion on 5/7.  Posttransfusion H/H 11.2/36 on 5/12.  Most recent PET/CT on 4/25 without active malignancy.  Last EGD/colonoscopy was 10/2011 at outside facility and notable for widely patent esophagus/GEJ, multiple antral erosions, scalloped small bowel (no path available for review).  Colonoscopy at that same time with 1 small prolapsing internal hemorrhoid, otherwise normal.  Sister has Celiac Disease.  She herself does not have a diagnosis of Celiac Disease, but has maintained a gluten-free diet for years as she otherwise feels better.  Has dealt with anemia during chemotherapy, but otherwise denies any prior known history of iron deficiency anemia.  Has been holding her ASA and dipyridamole for the last week.  Took her Arixtra  this morning.  Past Medical History:  Diagnosis Date   Abnormal genetic test    Allergy    Arthritis    DVT (deep venous thrombosis) (HCC)    lower extremity    Endometrial ca (HCC) 03/14/2017   uterine cancer 2018   Family history of adverse reaction to anesthesia    children had N/V    Family history of breast cancer    Family history of pancreatic cancer    Family history of prostate cancer in father    Goals of care, counseling/discussion 12/10/2019   History of kidney stones    Hypothyroidism    Hashimoto   Lichen sclerosus    MRSA (methicillin resistant staph aureus)  culture positive    Pneumonia    walking   Pulmonary embolism (HCC) 01/13/2016   After car trip   Stroke [facility])    multiple     2-16 -2021, 4-15- 2021 no defecits   do to blood clots    Past Surgical History:  Procedure Laterality Date   ABDOMINAL HYSTERECTOMY     BUBBLE STUDY  07/31/2019   Procedure: BUBBLE STUDY;  Surgeon: Lenise Quince,  MD;  Location: [facility] ENDOSCOPY;  Service: Cardiovascular;;   CYSTOSCOPY WITH RETROGRADE PYELOGRAM, URETEROSCOPY AND STENT PLACEMENT Left 05/18/2018   Procedure: CYSTOSCOPY WITH RETROGRADE PYELOGRAM, LEFT URETEROSCOPY AND LEFT URETERAL STENT PLACEMENT;  Surgeon: Andrez Banker, MD;  Location: [facility]S;  Service: Urology;  Laterality: Left;   CYSTOSCOPY WITH RETROGRADE PYELOGRAM, URETEROSCOPY AND STENT PLACEMENT Right 05/30/2018   Procedure: CYSTOSCOPY WITH RIGHT RETROGRADE PYELOGRAM, URETEROSCOPY LASER LITHOTRIPSY AND STENT PLACEMENT;  Surgeon: Andrez Banker, MD;  Location: [facility];  Service: Urology;  Laterality: Right;   HOLMIUM LASER APPLICATION Left 05/18/2018   Procedure: HOLMIUM LASER APPLICATION;  Surgeon: Andrez Banker, MD;  Location: [facility];  Service: Urology;  Laterality: Left;   HOLMIUM LASER APPLICATION Right 05/30/2018   Procedure: HOLMIUM LASER APPLICATION;  Surgeon: Andrez Banker, MD;  Location: [facility];  Service: Urology;  Laterality: Right;   IR CV LINE INJECTION  09/22/2020   IR CV LINE INJECTION  12/25/2022   IR IMAGING GUIDED PORT INSERTION  01/22/2020   MOUTH SURGERY     ROBOTIC ASSISTED TOTAL HYSTERECTOMY WITH BILATERAL SALPINGO OOPHERECTOMY Bilateral 04/17/2017   Procedure: XI ROBOTIC ASSISTED TOTAL HYSTERECTOMY WITH BILATERAL SALPINGO OOPHORECTOMY WITH SENTINAL LYMPH NODE BIOPSY AND POSSIBLE LYMPHADENECTOMY;  Surgeon: Andra Kava, MD;  Location: [facility];  Service: Gynecology;  Laterality: Bilateral;   ROBOTIC PELVIC AND PARA-AORTIC LYMPH NODE DISSECTION N/A 10/21/2019   Procedure: XI ROBOTIC PELVIC AND PARA-AORTIC LYMPH NODE DISSECTION;  Surgeon: Suzi Essex, MD;  Location: [facility];  Service: Gynecology;  Laterality: N/A;   TEE WITHOUT CARDIOVERSION N/A 07/31/2019   Procedure: TRANSESOPHAGEAL ECHOCARDIOGRAM (TEE);  Surgeon: Lenise Quince, MD;  Location: [facility] ENDOSCOPY;  Service: Cardiovascular;  Laterality: N/A;     Prior to Admission medications   Medication Sig Start Date End Date Taking? Authorizing Provider  ARMOUR THYROID  90 MG tablet Take 90 mg by mouth every morning. 07/27/23   [provider]  aspirin EC (ASPIR-LOW) 81 MG tablet Take 81 mg by mouth daily. 09/11/19   [provider]  cimetidine (TAGAMET) 400 MG tablet Take 400 mg by mouth 2 (two) times daily. 03/23/2023 Only takes M-F. 10/03/22   [provider]  dipyridamole (PERSANTINE) 75 MG tablet Take 75 mg by mouth 2 (two) times daily. 10/19/22   [provider]  doxycycline (MONODOX) 100 MG capsule Take 100 mg by mouth 2 (two) times daily. 10/03/22   [provider]  fondaparinux  (ARIXTRA ) 7.5 MG/0.6ML SOLN injection INJECT 0.6 MLS INTO THE SKIN DAILY 07/23/23   Ennever, Peter R, MD  lidocaine -prilocaine  (EMLA ) cream APPLY TOPICALLY TO THE AFFECTED AREA 1 TIME 01/06/22   Ennever, Peter R, MD  metFORMIN (GLUCOPHAGE) 500 MG tablet Take 500 mg by mouth 2 (two) times daily. 10/03/22   [provider]  Na Sulfate-K Sulfate-Mg Sulfate concentrate (SUPREP BOWEL PREP KIT) 17.5-3.13-1.6 GM/177ML SOLN Take 1 kit (354 mLs total) by mouth as directed. 10/18/23   Eriq Hufford V, DO  Naltrexone HCl, Pain, (NALTREX) 4.5 MG CAPS  Take by mouth at bedtime.    [provider]  ondansetron  (ZOFRAN ) 8 MG tablet Take 1 tablet (8 mg total) by mouth every 8 (eight) hours as needed for nausea or vomiting. 02/02/23   Ivor Mars, MD  Probiotic Product (PROBIOTIC BLEND PO) Take by mouth daily.    [provider]    Current Outpatient Medications  Medication Sig Dispense Refill   ARMOUR THYROID  90 MG tablet Take 90 mg by mouth every morning.     aspirin EC (ASPIR-LOW) 81 MG tablet Take 81 mg by mouth daily.     cimetidine (TAGAMET) 400 MG tablet Take 400 mg by mouth 2 (two) times daily. 03/23/2023 Only takes M-F.     dipyridamole (PERSANTINE) 75 MG tablet Take 75 mg by mouth 2 (two) times daily.      doxycycline (MONODOX) 100 MG capsule Take 100 mg by mouth 2 (two) times daily.     fondaparinux  (ARIXTRA ) 7.5 MG/0.6ML SOLN injection INJECT 0.6 MLS INTO THE SKIN DAILY 18 mL 4   lidocaine -prilocaine  (EMLA ) cream APPLY TOPICALLY TO THE AFFECTED AREA 1 TIME 30 g 4   metFORMIN (GLUCOPHAGE) 500 MG tablet Take 500 mg by mouth 2 (two) times daily.     Na Sulfate-K Sulfate-Mg Sulfate concentrate (SUPREP BOWEL PREP KIT) 17.5-3.13-1.6 GM/177ML SOLN Take 1 kit (354 mLs total) by mouth as directed. 324 mL 0   Naltrexone HCl, Pain, (NALTREX) 4.5 MG CAPS Take by mouth at bedtime.     ondansetron  (ZOFRAN ) 8 MG tablet Take 1 tablet (8 mg total) by mouth every 8 (eight) hours as needed for nausea or vomiting. 30 tablet 1   Probiotic Product (PROBIOTIC BLEND PO) Take by mouth daily.     No current facility-administered medications for this visit.    Allergies as of 10/24/2023 - Review Complete 10/24/2023  Allergen Reaction Noted   Bee venom Anaphylaxis, Swelling, and Other (See Comments) 04/13/2016   Aspartame Diarrhea 10/21/2019   Aspartame and phenylalanine Diarrhea 10/21/2019   Adhesive [tape] Other (See Comments) 04/05/2017    Family History  Problem Relation Age of Onset   Stroke Father    Alzheimer's disease Father    Hyperlipidemia Father    Prostate cancer Father    Cancer Father        in his nose cartiage   Deep vein thrombosis Sister    Other Sister        celiac sprue   Breast cancer Paternal Grandmother        In her 46s, deceased at 76; bilateral mastectomy    Arthritis Mother    Lung cancer Maternal Grandmother    Pancreatic cancer Maternal Grandmother        deceased in late 63s   Diabetes Paternal Grandfather    Pancreatic cancer Paternal Grandfather     Social History   Socioeconomic History   Marital status: Married    Spouse name: Not on file   Number of children: 4   Years of education: Not on file   Highest education level: Bachelor's degree (e.g., BA, AB, BS)   Occupational History   Occupation: sales  Tobacco Use   Smoking status: Never   Smokeless tobacco: Never  Vaping Use   Vaping status: Never Used  Substance and Sexual Activity   Alcohol use: Yes    Alcohol/week: 1.0 standard drink of alcohol    Types: 1 Glasses of wine per week    Comment: occassional   Drug use: No  Sexual activity: Not Currently    Birth control/protection: Surgical  Other Topics Concern   Not on file  Social History Narrative   Not on file   Social Drivers of Health   Financial Resource Strain: Low Risk  (09/03/2023)   Overall Financial Resource Strain (CARDIA)    Difficulty of Paying Living Expenses: Not hard at all  Food Insecurity: No Food Insecurity (09/03/2023)   Hunger Vital Sign    Worried About Running Out of Food in the Last Year: Never true    Ran Out of Food in the Last Year: Never true  Transportation Needs: No Transportation Needs (09/03/2023)   PRAPARE - Administrator, Civil Service (Medical): No    Lack of Transportation (Non-Medical): No  Physical Activity: Sufficiently Active (09/03/2023)   Exercise Vital Sign    Days of Exercise per Week: 4 days    Minutes of Exercise per Session: 50 min  Stress: No Stress Concern Present (09/03/2023)   [employer] of Occupational Health - Occupational Stress Questionnaire    Feeling of Stress : Not at all  Social Connections: Socially Integrated (09/03/2023)   Social Connection and Isolation Panel [NHANES]    Frequency of Communication with Friends and Family: More than three times a week    Frequency of Social Gatherings with Friends and Family: Three times a week    Attends Religious Services: More than 4 times per year    Active Member of Clubs or Organizations: Yes    Attends Banker Meetings: More than 4 times per year    Marital Status: Married  Catering manager Violence: Not on file    Physical Exam: Vital signs in last 24 hours: @LMP  09/04/2012  GEN:  NAD EYE: Sclerae anicteric ENT: MMM CV: Non-tachycardic Pulm: CTA b/l GI: Soft, NT/ND NEURO:  Alert & Oriented x 3   Harry Lindau, DO [facility]   10/24/2023 9:16 AM

## 2023-10-24 NOTE — Progress Notes (Signed)
 Called to room to assist during endoscopic procedure.  Patient ID and intended procedure confirmed with present staff. Received instructions for my participation in the procedure from the performing physician.

## 2023-10-24 NOTE — Op Note (Signed)
 [facility] Patient Name: Sherry Williams Procedure Date: 10/24/2023 10:36 AM MRN: [id number] Endoscopist: Harry Lindau , MD, [id number] Age: 63 Referring MD:  Date of Birth: July 12, 1960 Gender: Female Account #: [id number] Procedure:                Colonoscopy Indications:              Iron deficiency anemia Medicines:                Monitored Anesthesia Care Procedure:                Pre-Anesthesia Assessment:                           - Prior to the procedure, a History and Physical                            was performed, and patient medications and                            allergies were reviewed. The patient's tolerance of                            previous anesthesia was also reviewed. The risks                            and benefits of the procedure and the sedation                            options and risks were discussed with the patient.                            All questions were answered, and informed consent                            was obtained. Prior Anticoagulants: The patient has                            taken anticoagulant medication (Arixtra ), last dose                            was day of procedure. ASA Grade Assessment: III - A                            patient with severe systemic disease. After                            reviewing the risks and benefits, the patient was                            deemed in satisfactory condition to undergo the                            procedure.  After obtaining informed consent, the colonoscope                            was passed under direct vision. Throughout the                            procedure, the patient's blood pressure, pulse, and                            oxygen saturations were monitored continuously. The                            Olympus Scope [id number] was introduced through the                            anus and advanced to the the cecum, identified by                             appendiceal orifice and ileocecal valve. The                            colonoscopy was performed without difficulty. The                            patient tolerated the procedure well. The quality                            of the bowel preparation was good. The ileocecal                            valve, appendiceal orifice, and rectum were                            photographed. Scope In: 10:49:18 AM Scope Out: 11:14:50 AM Scope Withdrawal Time: 0 hours 20 minutes 18 seconds  Total Procedure Duration: 0 hours 25 minutes 32 seconds  Findings:                 The perianal and digital rectal examinations were                            normal.                           A 12 mm polyp was found in the sigmoid colon. The                            polyp was pedunculated. The polyp was removed with                            a hot snare. Resection and retrieval were complete.                            Given the active anticoagulation To close a defect  after polypectomy, one hemostatic clip was                            successfully placed (MR conditional). There was no                            bleeding at the end of the procedure. Estimated                            blood loss: none.                           A 6 mm polyp was found in the proximal transverse                            colon. The polyp was flat.                           Three additional smaller sessile polyps were found                            in the descending colon. The polyps were 2 to 3 mm                            in size.                           Normal mucosa was found in the entire colon. There                            was otherwise no active bleeding or stigmata of                            recent bleeding noted on this procedure.                           Non-bleeding internal hemorrhoids were found during                            retroflexion. The  hemorrhoids were small. Complications:            No immediate complications. Estimated Blood Loss:     Estimated blood loss: none. Impression:               - One 12 mm polyp in the sigmoid colon, removed                            with a hot snare. Resected and retrieved. Clip (MR                            conditional) was placed.                           - One 6 mm polyp in the proximal transverse colon.                           -  Three 2 to 3 mm polyps in the descending colon.                           - Normal mucosa in the entire examined colon.                           - Non-bleeding internal hemorrhoids.                           - Given the active anticoagulation and per                            conversation with the patient prior to the                            procedure, the flat polyp in the transverse colon                            and the smaller descending colon polyps were not                            resected today. We can discuss the role/utility of                            repeat colonoscopy at some juncture for resection                            of these smaller polyps. These polyps are otherwise                            not associated with the iron deficiency anemia. Recommendation:           - Patient has a contact number available for                            emergencies. The signs and symptoms of potential                            delayed complications were discussed with the                            patient. Return to normal activities tomorrow.                            Written discharge instructions were provided to the                            patient.                           - Resume previous diet.                           - Continue present medications.                           -  Await pathology results.                           - Return to GI clinic at appointment to be                            scheduled.                            - Resume Arixtra  tomorrow. Can resume antiplatelet                            therapy tomorrow as well. Harry Lindau, MD 10/24/2023 11:39:20 AM

## 2023-10-24 NOTE — Progress Notes (Signed)
 Vss nad trans to pacu

## 2023-10-24 NOTE — Op Note (Signed)
 [facility] Patient Name: Sherry Williams Procedure Date: 10/24/2023 10:37 AM MRN: [id number] Endoscopist: Harry Lindau , MD, [id number] Age: 63 Referring MD:  Date of Birth: 1960-06-30 Gender: Female Account #: [id number] Procedure:                Upper GI endoscopy Indications:              Iron deficiency anemia Medicines:                Monitored Anesthesia Care Procedure:                Pre-Anesthesia Assessment:                           - Prior to the procedure, a History and Physical                            was performed, and patient medications and                            allergies were reviewed. The patient's tolerance of                            previous anesthesia was also reviewed. The risks                            and benefits of the procedure and the sedation                            options and risks were discussed with the patient.                            All questions were answered, and informed consent                            was obtained. Prior Anticoagulants: The patient has                            taken anticoagulant medication (Arixtra ) with last                            dose was day of procedure. Otherwise has been                            holding aspirin for 7 days. ASA Grade Assessment:                            III - A patient with severe systemic disease. After                            reviewing the risks and benefits, the patient was                            deemed in satisfactory condition to undergo the  procedure.                           After obtaining informed consent, the endoscope was                            passed under direct vision. Throughout the                            procedure, the patient's blood pressure, pulse, and                            oxygen saturations were monitored continuously. The                            GIF [id number] #[id number] was introduced through the                             mouth, and advanced to the second part of duodenum.                            The upper GI endoscopy was accomplished without                            difficulty. The patient tolerated the procedure                            well. Scope In: Scope Out: Findings:                 The examined esophagus was normal.                           The Z-line was regular and was found 36 cm from the                            incisors.                           The gastroesophageal flap valve was visualized                            endoscopically and classified as Hill Grade III                            (minimal fold, loose to endoscope, hiatal hernia                            likely).                           The entire examined stomach was normal.                           The examined duodenum was normal. Complications:            No immediate complications. Estimated Blood Loss:  Estimated blood loss: none. Impression:               - Normal esophagus.                           - Z-line regular, 36 cm from the incisors.                           - Gastroesophageal flap valve classified as Hill                            Grade III (minimal fold, loose to endoscope, hiatal                            hernia likely).                           - Normal stomach.                           - Normal examined duodenum.                           - Given active anticoagulation (took Arixtra  this                            morning) and per discussion with patient prior to                            the procedure, no biopsies were performed during                            this procedure today. Recommendation:           - Perform a colonoscopy today.                           - Continue follow-up in the Hematology/Oncology                            clinic with serial CBC checks adn repeat iron panel.                           - If colonoscopy unrevealing and  continued iron                            deficiency anemia, may consider further small bowel                            interrogation with Video Capsule Endoscopy (VCE)                            and/or serologic testing for Celiac Disease. She                            has been on a gluten-free diet for years, so  typical Celiac serologies are unlikely to be                            accurate. May consider doing HLA DQ2/DQ8 testing.                            Alternatively, could consider repeat endoscopy with                            biopsy when holding anticoagulation. Harry Lindau, MD 10/24/2023 11:23:56 AM

## 2023-10-25 ENCOUNTER — Telehealth: Payer: Self-pay

## 2023-10-25 NOTE — Telephone Encounter (Signed)
 Phone call to the patient to make a follow up appointment in our office after colonoscopy.  Patient stated, " I do not think a follow up appointment is necessary at this time.  I will wait for the biopsy results, but I do not expect anything to be wrong."  I explained to the patient this was a recommendation made by the physician and there would not be a follow up appointment scheduled at this time.

## 2023-10-25 NOTE — Telephone Encounter (Signed)
  Follow up Call-     10/24/2023    9:44 AM  Call back number  Post procedure Call Back phone  # [phone]  Permission to leave phone message Yes     Patient questions:  Do you have a fever, pain , or abdominal swelling? No. Pain Score  0 *  Have you tolerated food without any problems? Yes.    Have you been able to return to your normal activities? Yes.    Do you have any questions about your discharge instructions: Diet   No. Medications  No. Follow up visit  No.  Do you have questions or concerns about your Care? No.  Actions: * If pain score is 4 or above: No action needed, pain <4.

## 2023-10-29 ENCOUNTER — Telehealth: Payer: Self-pay | Admitting: Gastroenterology

## 2023-10-29 ENCOUNTER — Ambulatory Visit: Payer: Self-pay | Admitting: Gastroenterology

## 2023-10-29 ENCOUNTER — Encounter: Payer: Self-pay | Admitting: Hematology & Oncology

## 2023-10-29 LAB — SURGICAL PATHOLOGY

## 2023-10-29 NOTE — Telephone Encounter (Signed)
 Patient called and stated that since she has had her procedure she is still seeing blood in stool. Patient would like a call back from the nurse to further advise her on what she should do. Please advise.

## 2023-10-29 NOTE — Telephone Encounter (Addendum)
 Called and spoke with patient. Patient reports 2 formed BMs on Saturday both accompanied by BRBPR and on the stool. Patient denies straining to have a BM. Patient did not have a BM Sunday. Reports small BM today with BRB again. Patient denies any abdominal pain and she "doesn't feel bad". Patient states that it may be irrelevant but she had a temp of 99 yesterday, her temp usually runs aroun 96/97. Patient states that she did not resume Aspirin or Dipyridamole, she has been on Arixtra  only. Patient is wondering if she needs to hold medicine at this time. I informed patient that she did have internal hemorrhoids noted on colonoscopy report. Patient states that she never knew she had hemorrhoids, never had symptoms. I told her that the bleeding is likely from that. Patient has not tried any hemorrhoidal treatment. Patient reports that she was supposed to call if she noticed "excessive bleeding", pt states that she is not sure what is excessive and what is not since she has not experienced this before. Patient states that she is not bleeding onto her undergarments only notices blood with wiping. Please advise, thanks.

## 2023-11-02 ENCOUNTER — Inpatient Hospital Stay

## 2023-11-02 ENCOUNTER — Inpatient Hospital Stay (HOSPITAL_BASED_OUTPATIENT_CLINIC_OR_DEPARTMENT_OTHER): Admitting: Hematology & Oncology

## 2023-11-02 ENCOUNTER — Encounter: Payer: Self-pay | Admitting: Gastroenterology

## 2023-11-02 ENCOUNTER — Other Ambulatory Visit: Payer: Self-pay

## 2023-11-02 ENCOUNTER — Telehealth: Payer: Self-pay | Admitting: Gastroenterology

## 2023-11-02 ENCOUNTER — Encounter: Payer: Self-pay | Admitting: Hematology & Oncology

## 2023-11-02 ENCOUNTER — Telehealth: Payer: Self-pay

## 2023-11-02 ENCOUNTER — Ambulatory Visit: Payer: Self-pay | Admitting: Hematology & Oncology

## 2023-11-02 VITALS — BP 89/52 | HR 63 | Temp 97.8°F | Resp 16 | Ht 66.0 in | Wt 121.0 lb

## 2023-11-02 VITALS — BP 89/56 | HR 56 | Resp 17

## 2023-11-02 DIAGNOSIS — C541 Malignant neoplasm of endometrium: Secondary | ICD-10-CM

## 2023-11-02 DIAGNOSIS — K625 Hemorrhage of anus and rectum: Secondary | ICD-10-CM

## 2023-11-02 DIAGNOSIS — D509 Iron deficiency anemia, unspecified: Secondary | ICD-10-CM

## 2023-11-02 DIAGNOSIS — Z7901 Long term (current) use of anticoagulants: Secondary | ICD-10-CM | POA: Diagnosis not present

## 2023-11-02 DIAGNOSIS — Z5112 Encounter for antineoplastic immunotherapy: Secondary | ICD-10-CM | POA: Diagnosis not present

## 2023-11-02 DIAGNOSIS — Z8673 Personal history of transient ischemic attack (TIA), and cerebral infarction without residual deficits: Secondary | ICD-10-CM | POA: Diagnosis not present

## 2023-11-02 DIAGNOSIS — Z79620 Long term (current) use of immunosuppressive biologic: Secondary | ICD-10-CM | POA: Diagnosis not present

## 2023-11-02 DIAGNOSIS — I82401 Acute embolism and thrombosis of unspecified deep veins of right lower extremity: Secondary | ICD-10-CM

## 2023-11-02 DIAGNOSIS — D649 Anemia, unspecified: Secondary | ICD-10-CM | POA: Diagnosis not present

## 2023-11-02 DIAGNOSIS — Z86718 Personal history of other venous thrombosis and embolism: Secondary | ICD-10-CM | POA: Diagnosis not present

## 2023-11-02 LAB — CMP (CANCER CENTER ONLY)
ALT: 11 U/L (ref 0–44)
AST: 14 U/L — ABNORMAL LOW (ref 15–41)
Albumin: 4.6 g/dL (ref 3.5–5.0)
Alkaline Phosphatase: 62 U/L (ref 38–126)
Anion gap: 8 (ref 5–15)
BUN: 19 mg/dL (ref 8–23)
CO2: 26 mmol/L (ref 22–32)
Calcium: 9.3 mg/dL (ref 8.9–10.3)
Chloride: 105 mmol/L (ref 98–111)
Creatinine: 0.81 mg/dL (ref 0.44–1.00)
GFR, Estimated: >60 mL/min (ref >60)
Glucose, Bld: 91 mg/dL (ref 70–99)
Potassium: 4.4 mmol/L (ref 3.5–5.1)
Sodium: 139 mmol/L (ref 135–145)
Total Bilirubin: 0.5 mg/dL (ref 0.0–1.2)
Total Protein: 5.7 g/dL — ABNORMAL LOW (ref 6.5–8.1)

## 2023-11-02 LAB — CBC WITH DIFFERENTIAL (CANCER CENTER ONLY)
Abs Immature Granulocytes: 0.02 10*3/uL (ref 0.00–0.07)
Basophils Absolute: 0 10*3/uL (ref 0.0–0.1)
Basophils Relative: 0 %
Eosinophils Absolute: 0 10*3/uL (ref 0.0–0.5)
Eosinophils Relative: 0 %
HCT: 32.1 % — ABNORMAL LOW (ref 36.0–46.0)
Hemoglobin: 10.1 g/dL — ABNORMAL LOW (ref 12.0–15.0)
Immature Granulocytes: 1 %
Lymphocytes Relative: 31 %
Lymphs Abs: 0.9 10*3/uL (ref 0.7–4.0)
MCH: 29.5 pg (ref 26.0–34.0)
MCHC: 31.5 g/dL (ref 30.0–36.0)
MCV: 93.9 fL (ref 80.0–100.0)
Monocytes Absolute: 0.3 10*3/uL (ref 0.1–1.0)
Monocytes Relative: 10 %
Neutro Abs: 1.7 10*3/uL (ref 1.7–7.7)
Neutrophils Relative %: 58 %
Platelet Count: 271 10*3/uL (ref 150–400)
RBC: 3.42 MIL/uL — ABNORMAL LOW (ref 3.87–5.11)
RDW: 18.5 % — ABNORMAL HIGH (ref 11.5–15.5)
WBC Count: 2.9 10*3/uL — ABNORMAL LOW (ref 4.0–10.5)
nRBC: 0 % (ref 0.0–0.2)

## 2023-11-02 LAB — RETICULOCYTES
Immature Retic Fract: 15.1 % (ref 2.3–15.9)
RBC.: 3.47 MIL/uL — ABNORMAL LOW (ref 3.87–5.11)
Retic Count, Absolute: 48.9 10*3/uL (ref 19.0–186.0)
Retic Ct Pct: 1.4 % (ref 0.4–3.1)

## 2023-11-02 LAB — SAMPLE TO BLOOD BANK

## 2023-11-02 LAB — FERRITIN: Ferritin: 700 ng/mL — ABNORMAL HIGH (ref 11–307)

## 2023-11-02 LAB — IRON AND IRON BINDING CAPACITY (CC-WL,HP ONLY)
Iron: 142 ug/dL (ref 28–170)
Saturation Ratios: 48 % — ABNORMAL HIGH (ref 10.4–31.8)
TIBC: 297 ug/dL (ref 250–450)
UIBC: 155 ug/dL (ref 148–442)

## 2023-11-02 LAB — SAVE SMEAR(SSMR), FOR PROVIDER SLIDE REVIEW

## 2023-11-02 LAB — LACTATE DEHYDROGENASE: LDH: 135 U/L (ref 98–192)

## 2023-11-02 LAB — CEA (ACCESS): CEA (CHCC): 1.59 ng/mL (ref 0.00–5.00)

## 2023-11-02 MED ORDER — SODIUM CHLORIDE 0.9 % IV SOLN: Freq: Once | INTRAVENOUS | Status: AC

## 2023-11-02 MED ORDER — SODIUM CHLORIDE 0.9 % IV SOLN
400.0000 mg | Freq: Once | INTRAVENOUS | Status: AC
  Administered 2023-11-02: 400 mg via INTRAVENOUS
  Filled 2023-11-02: qty 16

## 2023-11-02 MED ORDER — SODIUM CHLORIDE 0.9% FLUSH
10.0000 mL | INTRAVENOUS | Status: DC | PRN
Start: 2023-11-02 — End: 2023-11-02
  Administered 2023-11-02: 10 mL

## 2023-11-02 MED ORDER — HEPARIN SOD (PORK) LOCK FLUSH 100 UNIT/ML IV SOLN
500.0000 [IU] | Freq: Once | INTRAVENOUS | Status: AC | PRN
  Administered 2023-11-02: 500 [IU]

## 2023-11-02 NOTE — Telephone Encounter (Signed)
 Patient notified that she is scheduled to have video capsule endoscopy on 11-13-23 at 8:30am.  Patient aware that she is scheduled to follow up in our office with Dr Neil Balls on 12-18-23 at 2:40pm.    Patient advised to monitor for any signs of recurrent bleeding and make our office aware as this could necessitate repeat colonoscopy.  Patient instructions sent to patient via MyChart.  Patient agreed to plan and verbalized understanding.  No further questions.

## 2023-11-02 NOTE — Progress Notes (Signed)
 Per Dr. Maria Shiner ok to proceed without CMP results; CMP currently being run.

## 2023-11-02 NOTE — Progress Notes (Signed)
 Hematology and Oncology Follow Up Visit  Sherry Williams [id number] Oct 27, 1960 63 y.o. 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  We had she saw her recently.  The real problem is that she was incredibly anemic.  We found that she was markedly iron deficient.  Her iron studies showed a ferritin of only 9 with an iron saturation of 5%.  We went and give her some IV iron.  She actually was transfused.  She got donor specific blood.  She did have a upper endoscopy and colonoscopy.  This was all relatively unremarkable.  She is still having some rectal bleeding.  She may need to have an capsule endoscopy.  I will have to speak to Dr. Karene Oto about this.  Her hemoglobin today is 10.1.  I do not have back her iron studies as of yet.  Thankfully, there has been no evidence that she has recurrence of her cancer.  I am very happy about this.  Her last CA-125 was 9.1.  She, unfortunate, has had a death in her family.  Her father passed away.  I know this has been quite stressful on her.  I feel bad for her.  She is still watching what she eats.  She is quite diligent with respect to her nutritional intake.  She has had no problems with pain.  She has had no headache.  She has had no neurological issues..  She continues on her Arixtra .  We had to have her on the Arixtra  because of the past history of CVA.  We think that the CVA has been driven by underlying malignancy.  There has  been no leg swelling.  She has had no rashes.  There has been no cough or shortness of breath.  Overall, I would have to say that her performance status is probably ECOG 1.      Medications:  Current Outpatient Medications:    Ascorbic Acid (VITAMIN C) 1000 MG tablet, Take 2,000 mg by mouth in the morning and at bedtime., Disp: , Rfl:    ASTRAGALUS PO, Take by mouth. Takes 2 caps BID., Disp: , Rfl:    cimetidine (TAGAMET) 400 MG tablet, Take 400 mg by mouth 2 (two) times daily. 03/23/2023 Only takes M-F., Disp: , Rfl:    co-enzyme Q-10 30 MG capsule, Take 100 mg by mouth 2 (two) times daily., Disp: , Rfl:    doxycycline (MONODOX) 100 MG capsule, Take 100 mg by mouth 2 (two) times daily., Disp: , Rfl:    fondaparinux  (ARIXTRA ) 7.5 MG/0.6ML SOLN injection, INJECT 0.6 MLS INTO THE SKIN DAILY, Disp: 18 mL, Rfl: 4   Grape Seed Extract 100 MG CAPS, Take 1 capsule by mouth in the morning and at bedtime., Disp: , Rfl:    Green Tea, Camellia sinensis, (GREEN TEA EXTRACT PO), Take 2 capsules by mouth 2 (two) times daily., Disp: , Rfl:    ivermectin (STROMECTOL) 3 MG TABS tablet, Take 150 mcg/kg by mouth in  the morning and at bedtime., Disp: , Rfl:    lidocaine -prilocaine  (EMLA ) cream, APPLY TOPICALLY TO THE AFFECTED AREA 1 TIME, Disp: 30 g, Rfl: 4   MELATONIN ER PO, Take 180 mg by mouth at bedtime., Disp: , Rfl:    metFORMIN (GLUCOPHAGE) 500 MG tablet, Take 500 mg by mouth 2 (two) times daily., Disp: , Rfl:    Naltrexone HCl, Pain, (NALTREX) 4.5 MG CAPS, Take by mouth at bedtime., Disp: , Rfl:    Omega-3 Fatty Acids (FISH OIL) 300 MG CAPS, Take 1 capsule by mouth in the morning and at bedtime., Disp: , Rfl:    ondansetron  (ZOFRAN ) 8 MG tablet, Take 1 tablet (8 mg total) by mouth every 8 (eight) hours as needed for nausea or vomiting., Disp: 30 tablet, Rfl: 1   OVER THE COUNTER MEDICATION, Milk Thistle Dandelion- 3 at hs., Disp: , Rfl:    Probiotic Product (PROBIOTIC BLEND PO), Take by mouth daily.,  Disp: , Rfl:    Red Yeast Rice Extract (RED YEAST RICE PO), Take 1 capsule by mouth in the morning and at bedtime., Disp: , Rfl:    thyroid  (ARMOUR) 90 MG tablet, Take 90 mg by mouth every morning., Disp: , Rfl:    UNABLE TO FIND, Take 1 tablet by mouth in the morning and at bedtime. Coriolus-4 capsules BID., Disp: , Rfl:    aspirin EC (ASPIR-LOW) 81 MG tablet, Take 81 mg by mouth daily. (Patient not taking: Reported on 11/02/2023), Disp: , Rfl:    dipyridamole (PERSANTINE) 75 MG tablet, Take 75 mg by mouth 2 (two) times daily. (Patient not taking: Reported on 11/02/2023), Disp: , Rfl:    UNABLE TO FIND, Take 1 tablet by mouth in the morning and at bedtime. Burburine, bozwellia, modified citrus pectrin, quercetin, selenium, theracurmin, mistletoe injections, astragulus., Disp: , Rfl:   Allergies:  Allergies  Allergen Reactions   Bee Venom Anaphylaxis, Swelling and Other (See Comments)   Aspartame Diarrhea   Aspartame And Phenylalanine Diarrhea   Adhesive [Tape] Other (See Comments)    Irritation and red    Past Medical History, Surgical history, Social history, and Family History were reviewed and updated.  Review of Systems: Review of Systems  Constitutional: Negative.   HENT: Negative.    Eyes: Negative.   Respiratory: Negative.    Cardiovascular: Negative.   Gastrointestinal:  Positive for abdominal pain.  Genitourinary: Negative.   Musculoskeletal: Negative.   Skin: Negative.   Neurological: Negative.   Endo/Heme/Allergies: Negative.   Psychiatric/Behavioral: Negative.      Physical Exam: Her vital signs show temperature of 97.8.  Pulse 63.  Blood pressure 89/52.  Weight is 121 pounds.     Wt Readings from Last 3 Encounters:  11/02/23 121 lb (54.9 kg)  10/24/23 115 lb (52.2 kg)  10/12/23 123 lb (55.8 kg)    Physical Exam Vitals reviewed.  HENT:     Head: Normocephalic and atraumatic.  Eyes:     Pupils: Pupils are equal, round, and reactive to light.   Cardiovascular:     Rate and Rhythm: Normal rate and regular rhythm.     Heart sounds: Normal heart sounds.  Pulmonary:     Effort: Pulmonary effort is normal.     Breath sounds: Normal breath sounds.  Abdominal:     General: Bowel sounds are normal.     Palpations: Abdomen is soft.     Comments: Abdominal exam shows a soft abdomen.  She has well healing laparoscopic scars.  I  think there are 5 laparoscopic scars.  She has no erythema or exudate or warmth associated with these.  She has no fluid wave in the abdomen.  There is no guarding or rebound tenderness.  She has no palpable liver or spleen tip.  Musculoskeletal:        General: No tenderness or deformity. Normal range of motion.     Cervical back: Normal range of motion.  Lymphadenopathy:     Cervical: No cervical adenopathy.  Skin:    General: Skin is warm and dry.     Findings: No erythema or rash.  Neurological:     Mental Status: She is alert and oriented to person, place, and time.  Psychiatric:        Behavior: Behavior normal.        Thought Content: Thought content normal.        Judgment: Judgment normal.     Lab Results  Component Value Date   WBC 2.9 (L) 11/02/2023   HGB 10.1 (L) 11/02/2023   HCT 32.1 (L) 11/02/2023   MCV 93.9 11/02/2023   PLT 271 11/02/2023     Chemistry      Component Value Date/Time   NA 142 10/22/2023 1350   NA 142 02/15/2017 1518   NA 140 04/13/2016 1055   K 3.9 10/22/2023 1350   K 4.1 02/15/2017 1518   K 4.1 04/13/2016 1055   CL 105 10/22/2023 1350   CL 107 02/15/2017 1518   CO2 25 10/22/2023 1350   CO2 29 02/15/2017 1518   CO2 27 04/13/2016 1055   BUN 17 10/22/2023 1350   BUN 17 02/15/2017 1518   BUN 22.1 04/13/2016 1055   CREATININE 0.70 10/22/2023 1350   CREATININE 1.0 02/15/2017 1518   CREATININE 0.8 04/13/2016 1055      Component Value Date/Time   CALCIUM  8.9 10/22/2023 1350   CALCIUM  9.3 02/15/2017 1518   CALCIUM  9.9 04/13/2016 1055   ALKPHOS 68 10/22/2023  1350   ALKPHOS 79 02/15/2017 1518   ALKPHOS 81 04/13/2016 1055   AST 20 10/22/2023 1350   AST 20 04/13/2016 1055   ALT 16 10/22/2023 1350   ALT 24 02/15/2017 1518   ALT 22 04/13/2016 1055   BILITOT 0.5 10/22/2023 1350   BILITOT 0.44 04/13/2016 1055      Impression and Plan: Ms. Rotenberg is a 63 year old Caucasian female.  She has had another recurrence of her gynecologic malignancy.  We gave her chemotherapy.  She did incredibly well with chemotherapy.  She now is on maintenance immunotherapy.  I am just very happy that she is done so well.  The last PET scan showed that she has done incredibly well.  She is in remission.  We really try to figure out this GI bleeding.  Again, I do believe that she is having some GI blood loss.  She is profoundly iron deficient.  She is still having some intermittent GI bleeding.  She may need to have a capsule endoscopy.  We will continue with the Keytruda .  I will move her Keytruda  out to every 2 months right now.  Hopefully, if everything looks good on her next PET scan, which likely will be done in July or August, we can then move her appointments out to every 3 months.  Hopefully, we can get a handle on this bleeding.  We will see what her iron studies have to show.  At this point, we will have her come back to see us  in  another month or so.  I just want to make sure that we stay on top of this anemia and blood loss.    Ivor Mars, MD 5/23/20259:53 AM

## 2023-11-02 NOTE — Patient Instructions (Signed)
 [facility] - A DEPT OF [facility]  Discharge Instructions: Thank you for choosing [facility] to provide your oncology and hematology care.   If you have a lab appointment with the Cancer Center, please go directly to the Cancer Center and check in at the registration area.  Wear comfortable clothing and clothing appropriate for easy access to any Portacath or PICC line.   We strive to give you quality time with your provider. You may need to reschedule your appointment if you arrive late (15 or more minutes).  Arriving late affects you and other patients whose appointments are after yours.  Also, if you miss three or more appointments without notifying the office, you may be dismissed from the clinic at the provider's discretion.      For prescription refill requests, have your pharmacy contact our office and allow 72 hours for refills to be completed.    Today you received the following chemotherapy and/or immunotherapy agents Pembrolizumab      To help prevent nausea and vomiting after your treatment, we encourage you to take your nausea medication as directed.  BELOW ARE SYMPTOMS THAT SHOULD BE REPORTED IMMEDIATELY: *FEVER GREATER THAN 100.4 F (38 C) OR HIGHER *CHILLS OR SWEATING *NAUSEA AND VOMITING THAT IS NOT CONTROLLED WITH YOUR NAUSEA MEDICATION *UNUSUAL SHORTNESS OF BREATH *UNUSUAL BRUISING OR BLEEDING *URINARY PROBLEMS (pain or burning when urinating, or frequent urination) *BOWEL PROBLEMS (unusual diarrhea, constipation, pain near the anus) TENDERNESS IN MOUTH AND THROAT WITH OR WITHOUT PRESENCE OF ULCERS (sore throat, sores in mouth, or a toothache) UNUSUAL RASH, SWELLING OR PAIN  UNUSUAL VAGINAL DISCHARGE OR ITCHING   Items with * indicate a potential emergency and should be followed up as soon as possible or go to the Emergency Department if any problems should occur.  Please show the CHEMOTHERAPY ALERT CARD or  IMMUNOTHERAPY ALERT CARD at check-in to the Emergency Department and triage nurse. Should you have questions after your visit or need to cancel or reschedule your appointment, please contact [facility] - A DEPT OF [facility]  [phone] and follow the prompts.  Office hours are 8:00 a.m. to 4:30 p.m. Monday - Friday. Please note that voicemails left after 4:00 p.m. may not be returned until the following business day.  We are closed weekends and major holidays. You have access to a nurse at all times for urgent questions. Please call the main number to the clinic [phone] and follow the prompts.  For any non-urgent questions, you may also contact your provider using MyChart. We now offer e-Visits for anyone 65 and older to request care online for non-urgent symptoms. For details visit [email or web address].   Also download the MyChart app! Go to the app store, search "MyChart", open the app, select [place], and log in with your MyChart username and password.

## 2023-11-02 NOTE — Telephone Encounter (Signed)
 Patient is aware

## 2023-11-02 NOTE — Telephone Encounter (Signed)
 I received a message from Dr. Maria Shiner regarding Sherry Williams and her continued iron deficiency anemia.  I called her today.  She reports still having several episodes of hematochezia throughout the week, described as BRB mixed in the stool and throughout the toilet water .  Not just BRB on tissue paper.  Repeat hemoglobin today in the Hematology Clinic was down 1 g at 10.1 (was 11.2 on 5/12).  Repeat iron panel pending.  CEA normal.  I am curious if she actually did have a post polypectomy bleed despite doing prophylactic clipping of the single sigmoid polyp that was resected.  She has been holding her Arixtra , dipyridamole, aspirin.  She reports having a normal brown stool yesterday and again today, so resuming her Arixtra  now, but will continue holding her antiplatelet therapy (she discussed that plan with Dr. Maria Shiner who agrees).  Plan for the following: - Set up for expedited Video Capsule Endoscopy for further small bowel interrogation for her iron deficiency anemia - Monitor for any signs of recurrent bleeding which could necessitate repeat colonoscopy. - Schedule follow-up appointment with me in the GI clinic - Will follow-up on pending iron panel  All questions answered and she was appreciative for the phone call.

## 2023-11-03 LAB — ERYTHROPOIETIN: Erythropoietin: 11 m[IU]/mL (ref 2.6–18.5)

## 2023-11-06 ENCOUNTER — Encounter: Payer: Self-pay | Admitting: Hematology & Oncology

## 2023-11-08 DIAGNOSIS — H35372 Puckering of macula, left eye: Secondary | ICD-10-CM | POA: Diagnosis not present

## 2023-11-13 ENCOUNTER — Ambulatory Visit: Admitting: Gastroenterology

## 2023-11-13 DIAGNOSIS — D509 Iron deficiency anemia, unspecified: Secondary | ICD-10-CM | POA: Diagnosis not present

## 2023-11-13 NOTE — Patient Instructions (Signed)
 Sherry Williams,  You may have a clear liquid diet at 10:30 am  You may have a light lunch beginning at 12:30 ( ex 1/2 sandwich and a bowl of soup)  You may resume your normal diet at 5:00 pm  If you experience any abdominal pain, nausea ,or vomiting after ingesting the capsule please call [phone] to notify the nurse  You may not have an MRI until you have confirmation that you have passed the capsule.   If you have not passed the capsule after 72 hours please let us  know and we will order an abdominal x-ray.  Please return at 4 PM today to return equipment.

## 2023-11-13 NOTE — Progress Notes (Signed)
 Capsule ID: [id number] Exp: 07-26-2024 LOT: [id number]  Patient arrived for Capsule Endoscopy. Reported the prep went well. This nurse explained dietary restrictions for the next few hours. Patient verbalized understanding. Opened capsule, ensured capsule was flashing prior to the patient swallowing the capsule. Patient swallowed capsule without difficulty. Patient instructed to return to the office at 4:00 pm today for removal of the recording equipment, to call the office with any questions and if no capsule was visualized after 72 hours. No further questions by the conclusion of the visit.

## 2023-11-20 ENCOUNTER — Encounter: Payer: Self-pay | Admitting: Gastroenterology

## 2023-11-30 ENCOUNTER — Inpatient Hospital Stay: Attending: Hematology & Oncology

## 2023-11-30 ENCOUNTER — Inpatient Hospital Stay

## 2023-11-30 ENCOUNTER — Inpatient Hospital Stay (HOSPITAL_BASED_OUTPATIENT_CLINIC_OR_DEPARTMENT_OTHER): Admitting: Hematology & Oncology

## 2023-11-30 VITALS — BP 100/72 | HR 63 | Temp 98.3°F | Resp 16 | Ht 66.0 in | Wt 125.0 lb

## 2023-11-30 DIAGNOSIS — E063 Autoimmune thyroiditis: Secondary | ICD-10-CM | POA: Diagnosis not present

## 2023-11-30 DIAGNOSIS — C541 Malignant neoplasm of endometrium: Secondary | ICD-10-CM | POA: Insufficient documentation

## 2023-11-30 DIAGNOSIS — Z79899 Other long term (current) drug therapy: Secondary | ICD-10-CM | POA: Diagnosis not present

## 2023-11-30 DIAGNOSIS — Z7901 Long term (current) use of anticoagulants: Secondary | ICD-10-CM | POA: Diagnosis not present

## 2023-11-30 DIAGNOSIS — D509 Iron deficiency anemia, unspecified: Secondary | ICD-10-CM | POA: Insufficient documentation

## 2023-11-30 DIAGNOSIS — I82401 Acute embolism and thrombosis of unspecified deep veins of right lower extremity: Secondary | ICD-10-CM | POA: Diagnosis not present

## 2023-11-30 DIAGNOSIS — Z86718 Personal history of other venous thrombosis and embolism: Secondary | ICD-10-CM | POA: Diagnosis not present

## 2023-11-30 DIAGNOSIS — Z8673 Personal history of transient ischemic attack (TIA), and cerebral infarction without residual deficits: Secondary | ICD-10-CM | POA: Insufficient documentation

## 2023-11-30 DIAGNOSIS — Z95828 Presence of other vascular implants and grafts: Secondary | ICD-10-CM

## 2023-11-30 LAB — CBC WITH DIFFERENTIAL (CANCER CENTER ONLY)
Abs Immature Granulocytes: 0.03 10*3/uL (ref 0.00–0.07)
Basophils Absolute: 0 10*3/uL (ref 0.0–0.1)
Basophils Relative: 1 %
Eosinophils Absolute: 0 10*3/uL (ref 0.0–0.5)
Eosinophils Relative: 0 %
HCT: 33.3 % — ABNORMAL LOW (ref 36.0–46.0)
Hemoglobin: 10.7 g/dL — ABNORMAL LOW (ref 12.0–15.0)
Immature Granulocytes: 1 %
Lymphocytes Relative: 34 %
Lymphs Abs: 0.9 10*3/uL (ref 0.7–4.0)
MCH: 30.7 pg (ref 26.0–34.0)
MCHC: 32.1 g/dL (ref 30.0–36.0)
MCV: 95.4 fL (ref 80.0–100.0)
Monocytes Absolute: 0.3 10*3/uL (ref 0.1–1.0)
Monocytes Relative: 11 %
Neutro Abs: 1.5 10*3/uL — ABNORMAL LOW (ref 1.7–7.7)
Neutrophils Relative %: 53 %
Platelet Count: 265 10*3/uL (ref 150–400)
RBC: 3.49 MIL/uL — ABNORMAL LOW (ref 3.87–5.11)
RDW: 18.7 % — ABNORMAL HIGH (ref 11.5–15.5)
Smear Review: NORMAL
WBC Count: 2.8 10*3/uL — ABNORMAL LOW (ref 4.0–10.5)
nRBC: 0 % (ref 0.0–0.2)

## 2023-11-30 LAB — IRON AND IRON BINDING CAPACITY (CC-WL,HP ONLY)
Iron: 172 ug/dL — ABNORMAL HIGH (ref 28–170)
Saturation Ratios: 55 % — ABNORMAL HIGH (ref 10.4–31.8)
TIBC: 315 ug/dL (ref 250–450)
UIBC: 143 ug/dL — ABNORMAL LOW (ref 148–442)

## 2023-11-30 LAB — CMP (CANCER CENTER ONLY)
ALT: 11 U/L (ref 0–44)
AST: 14 U/L — ABNORMAL LOW (ref 15–41)
Albumin: 4.6 g/dL (ref 3.5–5.0)
Alkaline Phosphatase: 62 U/L (ref 38–126)
Anion gap: 8 (ref 5–15)
BUN: 17 mg/dL (ref 8–23)
CO2: 28 mmol/L (ref 22–32)
Calcium: 9.7 mg/dL (ref 8.9–10.3)
Chloride: 106 mmol/L (ref 98–111)
Creatinine: 0.77 mg/dL (ref 0.44–1.00)
GFR, Estimated: >60 mL/min (ref >60)
Glucose, Bld: 93 mg/dL (ref 70–99)
Potassium: 4.1 mmol/L (ref 3.5–5.1)
Sodium: 142 mmol/L (ref 135–145)
Total Bilirubin: 1 mg/dL (ref 0.0–1.2)
Total Protein: 6.2 g/dL — ABNORMAL LOW (ref 6.5–8.1)

## 2023-11-30 LAB — RETICULOCYTES
Immature Retic Fract: 12.1 % (ref 2.3–15.9)
RBC.: 3.52 MIL/uL — ABNORMAL LOW (ref 3.87–5.11)
Retic Count, Absolute: 61.6 10*3/uL (ref 19.0–186.0)
Retic Ct Pct: 1.8 % (ref 0.4–3.1)

## 2023-11-30 LAB — FERRITIN: Ferritin: 319 ng/mL — ABNORMAL HIGH (ref 11–307)

## 2023-11-30 MED ORDER — SODIUM CHLORIDE 0.9% FLUSH
10.0000 mL | Freq: Once | INTRAVENOUS | Status: AC
  Administered 2023-11-30: 10 mL via INTRAVENOUS

## 2023-11-30 MED ORDER — HEPARIN SOD (PORK) LOCK FLUSH 100 UNIT/ML IV SOLN
500.0000 [IU] | Freq: Once | INTRAVENOUS | Status: AC
  Administered 2023-11-30: 500 [IU] via INTRAVENOUS

## 2023-11-30 NOTE — Patient Instructions (Signed)

## 2023-11-30 NOTE — Progress Notes (Signed)
 Hematology and Oncology Follow Up Visit  Sherry Williams [id number] [birth year] 63 y.o. 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  She is here today so we can monitor her hemoglobin.  This is been an issue lately.  She was profoundly iron deficient.  Again, I am not sure as to why she was so iron deficient.  She has improved quite nicely.  Again she has had no obvious bleeding.  She did have a little bit of bleeding I think after her procedures.  As far as her cancer, this has not been a problem for us .  I been very impressed with how well she has done.  She is on Keytruda  right now.  She is due for her next treatment in July.  Her last CA 125 was 9.1 in May.  She has had no change in bowel or bladder habits.  There has been no cough.  She has had no nausea or vomiting..  She continues on Arixtra .  I think we might be able to cut this dose down to 5 mg daily.  I think that since that she has had no evidence of obvious active cancer, we might be able to decrease the dose of the Arixtra  as I thought that the cancer was driving her thromboembolic disease.  She has had no fever.  She has had no headache.  Overall, I would say that her performance status is probably ECOG 0.      Medications:  Current Outpatient Medications:    Ascorbic Acid (VITAMIN C PO), Take 2,000 mg by mouth. Also receives Vitamin C infusion  75 mg/week, Disp: , Rfl:    Ascorbic Acid (VITAMIN C) 1000 MG  tablet, Take 2,000 mg by mouth in the morning and at bedtime., Disp: , Rfl:    aspirin EC (ASPIR-LOW) 81 MG tablet, Take 81 mg by mouth daily. (Patient not taking: Reported on 11/02/2023), Disp: , Rfl:    ASTRAGALUS PO, Take by mouth. Takes 2 caps BID., Disp: , Rfl:    castor oil liquid, Take by mouth. PRN lymph discomfort, Disp: , Rfl:    CHLOROPHYLL PO, Take by mouth. One teaspoon prior to coffee enemas., Disp: , Rfl:    cimetidine (TAGAMET) 400 MG tablet, Take 400 mg by mouth 2 (two) times daily. 03/23/2023 Only takes M-F., Disp: , Rfl:    co-enzyme Q-10 30 MG capsule, Take 100 mg by mouth 2 (two) times daily., Disp: , Rfl:    dipyridamole (PERSANTINE) 75 MG tablet, Take 75 mg by mouth 2 (two) times daily. (Patient not taking: Reported on 11/02/2023), Disp: , Rfl:    doxycycline (MONODOX) 100 MG capsule, Take 100 mg by mouth 2 (two) times daily., Disp: , Rfl:    fondaparinux  (ARIXTRA ) 7.5 MG/0.6ML SOLN injection, INJECT 0.6 MLS INTO THE SKIN DAILY, Disp: 18 mL, Rfl: 4   Grape Seed Extract 100 MG CAPS, Take 1 capsule by mouth in the morning and at bedtime., Disp: , Rfl:  Green Tea, Camellia sinensis, (GREEN TEA EXTRACT PO), Take 2 capsules by mouth 2 (two) times daily., Disp: , Rfl:    ivermectin (STROMECTOL) 3 MG TABS tablet, Take 150 mcg/kg by mouth in the morning and at bedtime., Disp: , Rfl:    lidocaine -prilocaine  (EMLA ) cream, APPLY TOPICALLY TO THE AFFECTED AREA 1 TIME, Disp: 30 g, Rfl: 4   MELATONIN ER PO, Take 180 mg by mouth at bedtime., Disp: , Rfl:    metFORMIN (GLUCOPHAGE) 500 MG tablet, Take 500 mg by mouth 2 (two) times daily., Disp: , Rfl:    Naltrexone HCl, Pain, (NALTREX) 4.5 MG CAPS, Take by mouth at bedtime., Disp: , Rfl:    Omega-3 Fatty Acids (FISH OIL) 300 MG CAPS, Take 1 capsule by mouth in the morning and at bedtime., Disp: , Rfl:    ondansetron  (ZOFRAN ) 8 MG tablet, Take 1 tablet (8 mg total) by mouth every 8 (eight) hours as needed for nausea or vomiting., Disp: 30  tablet, Rfl: 1   OVER THE COUNTER MEDICATION, Milk Thistle Dandelion- 3 at hs., Disp: , Rfl:    OVER THE COUNTER MEDICATION, Boswellia/Frankincense 350 mg - two caps daily, Disp: , Rfl:    OVER THE COUNTER MEDICATION, Berberine 500 mg - 3 caps daily, Disp: , Rfl:    OVER THE COUNTER MEDICATION, Coffee enemas-4 x weekly., Disp: , Rfl:    OVER THE COUNTER MEDICATION, Natto Serrazime - Three times daily., Disp: , Rfl:    OVER THE COUNTER MEDICATION, Rebounding prn lymph swelling, Disp: , Rfl:    OVER THE COUNTER MEDICATION, Sauna 2 times weekly., Disp: , Rfl:    OVER THE COUNTER MEDICATION, Mistletoe Pini, thigh injections 1/week until reaction then increase dose 09-20-22, pause for 2 Keytruda  cycles, then resume 3/weekly on non IV Vitamin C days., Disp: , Rfl:    OVER THE COUNTER MEDICATION, Theracurmin 4 daily with food., Disp: , Rfl:    Pectin Cit-Inos-C-Bioflav-Soy (MODIFIED CITRUS PECTIN PO), Take by mouth. One scoop BID, Disp: , Rfl:    PRESCRIPTION MEDICATION, There-Biotic Women's  1-2 daily, Disp: , Rfl:    Probiotic Product (PROBIOTIC BLEND PO), Take by mouth daily., Disp: , Rfl:    QUERCETIN PO, Take by mouth. Quercetin Phytosome 250mg  - two daily., Disp: , Rfl:    Red Yeast Rice Extract (RED YEAST RICE PO), Take 1 capsule by mouth in the morning and at bedtime., Disp: , Rfl:    SELENIUM PO, Take 800 mg by mouth daily., Disp: , Rfl:    thyroid  (ARMOUR) 90 MG tablet, Take 90 mg by mouth every morning., Disp: , Rfl:    UNABLE TO FIND, Take 1 tablet by mouth in the morning and at bedtime. Coriolus-4 capsules BID., Disp: , Rfl:    UNABLE TO FIND, Take 1 tablet by mouth in the morning and at bedtime. Burburine, bozwellia, modified citrus pectrin, quercetin, selenium, theracurmin, mistletoe injections, astragulus., Disp: , Rfl:    vitamin E 180 MG (400 UNITS) capsule, Take 400 Units by mouth daily. Takes 3 x weekly., Disp: , Rfl:   Allergies:  Allergies  Allergen Reactions   Bee Venom  Anaphylaxis, Swelling and Other (See Comments)   Aspartame Diarrhea   Aspartame And Phenylalanine Diarrhea   Adhesive [Tape] Other (See Comments)    Irritation and red    Past Medical History, Surgical history, Social history, and Family History were reviewed and updated.  Review of Systems: Review of Systems  Constitutional: Negative.   HENT: Negative.  Eyes: Negative.   Respiratory: Negative.    Cardiovascular: Negative.   Gastrointestinal:  Positive for abdominal pain.  Genitourinary: Negative.   Musculoskeletal: Negative.   Skin: Negative.   Neurological: Negative.   Endo/Heme/Allergies: Negative.   Psychiatric/Behavioral: Negative.      Physical Exam: Her vital signs show temperature of 98.3.  Pulse 63.  Blood pressure 100/72.  Weight is 125 pounds.     Wt Readings from Last 3 Encounters:  11/30/23 125 lb (56.7 kg)  11/02/23 121 lb (54.9 kg)  10/24/23 115 lb (52.2 kg)    Physical Exam Vitals reviewed.  HENT:     Head: Normocephalic and atraumatic.   Eyes:     Pupils: Pupils are equal, round, and reactive to light.    Cardiovascular:     Rate and Rhythm: Normal rate and regular rhythm.     Heart sounds: Normal heart sounds.  Pulmonary:     Effort: Pulmonary effort is normal.     Breath sounds: Normal breath sounds.  Abdominal:     General: Bowel sounds are normal.     Palpations: Abdomen is soft.     Comments: Abdominal exam shows a soft abdomen.  She has well healing laparoscopic scars.  I think there are 5 laparoscopic scars.  She has no erythema or exudate or warmth associated with these.  She has no fluid wave in the abdomen.  There is no guarding or rebound tenderness.  She has no palpable liver or spleen tip.   Musculoskeletal:        General: No tenderness or deformity. Normal range of motion.     Cervical back: Normal range of motion.  Lymphadenopathy:     Cervical: No cervical adenopathy.   Skin:    General: Skin is warm and dry.      Findings: No erythema or rash.   Neurological:     Mental Status: She is alert and oriented to person, place, and time.   Psychiatric:        Behavior: Behavior normal.        Thought Content: Thought content normal.        Judgment: Judgment normal.      Lab Results  Component Value Date   WBC 2.8 (L) 11/30/2023   HGB 10.7 (L) 11/30/2023   HCT 33.3 (L) 11/30/2023   MCV 95.4 11/30/2023   PLT 265 11/30/2023     Chemistry      Component Value Date/Time   NA 142 11/30/2023 0958   NA 142 02/15/2017 1518   NA 140 04/13/2016 1055   K 4.1 11/30/2023 0958   K 4.1 02/15/2017 1518   K 4.1 04/13/2016 1055   CL 106 11/30/2023 0958   CL 107 02/15/2017 1518   CO2 28 11/30/2023 0958   CO2 29 02/15/2017 1518   CO2 27 04/13/2016 1055   BUN 17 11/30/2023 0958   BUN 17 02/15/2017 1518   BUN 22.1 04/13/2016 1055   CREATININE 0.77 11/30/2023 0958   CREATININE 1.0 02/15/2017 1518   CREATININE 0.8 04/13/2016 1055      Component Value Date/Time   CALCIUM  9.7 11/30/2023 0958   CALCIUM  9.3 02/15/2017 1518   CALCIUM  9.9 04/13/2016 1055   ALKPHOS 62 11/30/2023 0958   ALKPHOS 79 02/15/2017 1518   ALKPHOS 81 04/13/2016 1055   AST 14 (L) 11/30/2023 0958   AST 20 04/13/2016 1055   ALT 11 11/30/2023 0958   ALT 24 02/15/2017 1518   ALT 22 04/13/2016  1055   BILITOT 1.0 11/30/2023 0958   BILITOT 0.44 04/13/2016 1055      Impression and Plan: Sherry Williams is a 63 year old Caucasian female.  She has had another recurrence of her gynecologic malignancy.  We gave her chemotherapy.  She did incredibly well with chemotherapy.  She now is on maintenance immunotherapy.  I am just very happy that she is done so well.  The last PET scan showed that she has done incredibly well.  She is in remission.  Her hemoglobin is slowly getting better.  Very happy about this.  She is doing fantastic.  For right now, I do not think we have to do anything special.  I think her iron studies probably going to be  okay.  Again, we will see about her Keytruda  in July.  If her next PET scan looks good, which will be done in August, then we can move her Keytruda  out to every 3 months in my opinion.  Again I am just very happy that we have not had any evidence of cancer recurrence.  Again we will see about decreasing her Arixtra .  She says she just got a hold box of 7.5 mg dosing.  I told her to go ahead and use this.  We could certainly tell her to waste some of this to make 5 mg.  Again she does not know how much liquid the Arixtra  is mixed with.  We will have to find.  I told her to send me a picture of the syringe and that might help me out.  I will plan to see her back in 1 month.  When she comes back, she will get her pembrolizumab .    Ivor Mars, MD 6/20/20255:04 PM

## 2023-12-01 ENCOUNTER — Encounter: Payer: Self-pay | Admitting: Hematology & Oncology

## 2023-12-01 LAB — CA 125: Cancer Antigen (CA) 125: 10 U/mL (ref 0.0–38.1)

## 2023-12-03 ENCOUNTER — Encounter: Payer: Self-pay | Admitting: *Deleted

## 2023-12-03 NOTE — Progress Notes (Signed)
 Video capsule endoscopy results reviewed and notable for the following:  Findings: 1) Complete capsule endoscopy with adequate prep 2) Negative study.  No active bleeding nor high-grade stigmata of bleeding noted throughout the study  Summary and Recommendations: Complete capsule study with normal-appearing small bowel throughout.  No small bowel pathology to explain iron deficiency anemia.  - Continue follow-up in the Hematology/Oncology clinic   Sandor Flatter, DO, [facility]

## 2023-12-04 ENCOUNTER — Other Ambulatory Visit: Payer: Self-pay

## 2023-12-05 ENCOUNTER — Other Ambulatory Visit: Payer: Self-pay

## 2023-12-07 ENCOUNTER — Other Ambulatory Visit: Payer: Self-pay

## 2023-12-10 ENCOUNTER — Other Ambulatory Visit: Payer: Self-pay | Admitting: Hematology & Oncology

## 2023-12-15 ENCOUNTER — Other Ambulatory Visit: Payer: Self-pay | Admitting: Hematology & Oncology

## 2023-12-17 ENCOUNTER — Encounter: Payer: Self-pay | Admitting: Hematology & Oncology

## 2023-12-17 ENCOUNTER — Encounter: Payer: Self-pay | Admitting: Family

## 2023-12-18 ENCOUNTER — Encounter: Payer: Self-pay | Admitting: Gastroenterology

## 2023-12-18 ENCOUNTER — Ambulatory Visit (INDEPENDENT_AMBULATORY_CARE_PROVIDER_SITE_OTHER): Admitting: Gastroenterology

## 2023-12-18 VITALS — BP 90/56 | HR 64 | Ht 64.5 in | Wt 128.1 lb

## 2023-12-18 DIAGNOSIS — Z860101 Personal history of adenomatous and serrated colon polyps: Secondary | ICD-10-CM

## 2023-12-18 DIAGNOSIS — Z860100 Personal history of colon polyps, unspecified: Secondary | ICD-10-CM

## 2023-12-18 DIAGNOSIS — D509 Iron deficiency anemia, unspecified: Secondary | ICD-10-CM | POA: Diagnosis not present

## 2023-12-18 DIAGNOSIS — C541 Malignant neoplasm of endometrium: Secondary | ICD-10-CM

## 2023-12-18 DIAGNOSIS — Z8542 Personal history of malignant neoplasm of other parts of uterus: Secondary | ICD-10-CM | POA: Diagnosis not present

## 2023-12-18 NOTE — Progress Notes (Unsigned)
 Chief Complaint:    Iron deficiency anemia, procedure follow-up  GI History: 63 year old female with history of metastatic uterine carcinoma, currently treated with maintenance immunotherapy (completed chemotherapy), along with history of DVT, multi-infarct CVA (on ASA 81 mg, dipyridamole, and Arixtra ), referred to the GI clinic by Dr. Timmy in 10/2023 for expedited evaluation of iron deficiency anemia.  No overt GI bleeding.  Family history notable for sister with Celiac Disease. She herself does not have a diagnosis of Celiac Disease, but has maintained a gluten-free diet for years as she otherwise feels better. Has dealt with anemia during chemotherapy, but otherwise denies any prior known history of iron deficiency anemia.   - 09/2023: PET/CT without evidence of active malignancy - 10/12/2023: H/H 6.7/22 with MCV/RDW 94/14.  FOBT negative.  Ferritin 9, iron 23, TIBC 437, sat 5%.  Repeat 2 days later was 6.2/20.4 and she was given 2 units RBC transfusion on 5/7.  Posttransfusion H/H 11.2/36 on 5/12.  -10/24/2023: EGD: Normal, Hill grade 3 valve.  Duodenal biopsies not obtained due to active anticoagulation (Arixtra ), GFD x many years, and per conversation with patient prior to procedure - 10/24/2023: Colonoscopy: 12 mm pedunculated sigmoid polyp removed with hot snare and clip closure (path: Adenoma), 6 mm flat transverse colon polyp, 3 small 2-3 mm descending colon polyps (none of these smaller polyps were removed due to active anticoagulation and per conversation with patient).  Otherwise normal mucosa throughout the colon.  Small internal hemorrhoids. - 11/13/2023: VCE: Normal   EGD/colonoscopy was 10/2011 at outside facility and notable for widely patent esophagus/GEJ, multiple antral erosions, scalloped small bowel (no path available for review). Colonoscopy at that same time with 1 small prolapsing internal hemorrhoid, otherwise normal.   HPI:     Patient is a 63 y.o. female presenting to the  Gastroenterology Clinic for follow-up after EGD, colonoscopy, video capsule endoscopy for evaluation of IDA as outlined above.  Had follow-up with Dr. Timmy on 11/30/2023.  No longer on ASA and dipyridamole.  Potentially reducing Arixtra  to 5 mg daily.  Otherwise doing well from a oncologic standpoint.  Continuing immunotherapy.  Repeat labs on 6/20 show stable H/H at 10.7/33.3, along with ferritin 319, iron 172, TIBC 315, sat 55%, showing robust response to blood transfusion and IV iron x2.  Normal liver enzymes and renal function on CMP.  Today, she states she otherwise feels well from a GI standpoint.  No overt bleeding.  No GI symptoms at all.   Review of systems:     No chest pain, no SOB, no fevers, no urinary sx   Past Medical History:  Diagnosis Date   Abnormal genetic test    Allergy    Anemia    Arthritis    Blood transfusion without reported diagnosis    DVT (deep venous thrombosis) (HCC)    lower extremity    Endometrial ca (HCC) 03/14/2017   uterine cancer 2018   Family history of adverse reaction to anesthesia    children had N/V    Family history of breast cancer    Family history of pancreatic cancer    Family history of prostate cancer in father    Goals of care, counseling/discussion 12/10/2019   History of kidney stones    Hypothyroidism    Hashimoto   Lichen sclerosus    MRSA (methicillin resistant staph aureus) culture positive    Pneumonia    walking   Pulmonary embolism (HCC) 01/13/2016   After car trip   Stroke (  HCC)    multiple     2-16 -2021, 4-15- 2021 no defecits   do to blood clots    Patient's surgical history, family medical history, social history, medications and allergies were all reviewed in Epic    Current Outpatient Medications  Medication Sig Dispense Refill   Ascorbic Acid (VITAMIN C PO) Take 2,000 mg by mouth. Also receives Vitamin C infusion  75 mg/week     Ascorbic Acid (VITAMIN C) 1000 MG tablet Take 2,000 mg by mouth in the  morning and at bedtime.     aspirin EC (ASPIR-LOW) 81 MG tablet Take 81 mg by mouth daily. (Patient not taking: Reported on 11/02/2023)     ASTRAGALUS PO Take by mouth. Takes 2 caps BID.     castor oil liquid Take by mouth. PRN lymph discomfort     CHLOROPHYLL PO Take by mouth. One teaspoon prior to coffee enemas.     cimetidine (TAGAMET) 400 MG tablet Take 400 mg by mouth 2 (two) times daily. 03/23/2023 Only takes M-F.     co-enzyme Q-10 30 MG capsule Take 100 mg by mouth 2 (two) times daily.     dipyridamole (PERSANTINE) 75 MG tablet Take 75 mg by mouth 2 (two) times daily. (Patient not taking: Reported on 11/02/2023)     doxycycline (MONODOX) 100 MG capsule Take 100 mg by mouth 2 (two) times daily.     fondaparinux  (ARIXTRA ) 5 MG/0.4ML SOLN injection Inject 0.4 mLs (5 mg total) into the skin daily. 2 mL 2   Grape Seed Extract 100 MG CAPS Take 1 capsule by mouth in the morning and at bedtime.     Green Tea, Camellia sinensis, (GREEN TEA EXTRACT PO) Take 2 capsules by mouth 2 (two) times daily.     ivermectin (STROMECTOL) 3 MG TABS tablet Take 150 mcg/kg by mouth in the morning and at bedtime.     lidocaine -prilocaine  (EMLA ) cream APPLY TOPICALLY TO THE AFFECTED AREA 1 TIME 30 g 4   MELATONIN ER PO Take 180 mg by mouth at bedtime.     metFORMIN (GLUCOPHAGE) 500 MG tablet Take 500 mg by mouth 2 (two) times daily.     Naltrexone HCl, Pain, (NALTREX) 4.5 MG CAPS Take by mouth at bedtime.     Omega-3 Fatty Acids (FISH OIL) 300 MG CAPS Take 1 capsule by mouth in the morning and at bedtime.     ondansetron  (ZOFRAN ) 8 MG tablet Take 1 tablet (8 mg total) by mouth every 8 (eight) hours as needed for nausea or vomiting. 30 tablet 1   OVER THE COUNTER MEDICATION Milk Thistle Dandelion- 3 at hs.     OVER THE COUNTER MEDICATION Boswellia/Frankincense 350 mg - two caps daily     OVER THE COUNTER MEDICATION Berberine 500 mg - 3 caps daily     OVER THE COUNTER MEDICATION Coffee enemas-4 x weekly.     OVER THE  COUNTER MEDICATION Natto Serrazime - Three times daily.     OVER THE COUNTER MEDICATION Rebounding prn lymph swelling     OVER THE COUNTER MEDICATION Sauna 2 times weekly.     OVER THE COUNTER MEDICATION Mistletoe Pini, thigh injections 1/week until reaction then increase dose 09-20-22, pause for 2 Keytruda  cycles, then resume 3/weekly on non IV Vitamin C days.     OVER THE COUNTER MEDICATION Theracurmin 4 daily with food.     Pectin Cit-Inos-C-Bioflav-Soy (MODIFIED CITRUS PECTIN PO) Take by mouth. One scoop BID     PRESCRIPTION  MEDICATION There-Biotic Women's  1-2 daily     Probiotic Product (PROBIOTIC BLEND PO) Take by mouth daily.     QUERCETIN PO Take by mouth. Quercetin Phytosome 250mg  - two daily.     Red Yeast Rice Extract (RED YEAST RICE PO) Take 1 capsule by mouth in the morning and at bedtime.     SELENIUM PO Take 800 mg by mouth daily.     thyroid  (ARMOUR) 90 MG tablet Take 90 mg by mouth every morning.     UNABLE TO FIND Take 1 tablet by mouth in the morning and at bedtime. Coriolus-4 capsules BID.     UNABLE TO FIND Take 1 tablet by mouth in the morning and at bedtime. Burburine, bozwellia, modified citrus pectrin, quercetin, selenium, theracurmin, mistletoe injections, astragulus.     vitamin E 180 MG (400 UNITS) capsule Take 400 Units by mouth daily. Takes 3 x weekly.     No current facility-administered medications for this visit.    Physical Exam:     LMP 09/04/2012   GENERAL:  Pleasant female in NAD PSYCH: : Cooperative, normal affect Musculoskeletal:  Normal muscle tone, normal strength NEURO: Alert and oriented x 3, no focal neurologic deficits   IMPRESSION and PLAN:    1) Iron deficiency anemia Has completed extensive endoscopic evaluation, to include EGD, colonoscopy, and Video Capsule Endoscopy.  No active bleeding nor high-grade stigmata of bleeding noted on the studies.  Has had good response to RBC transfusion and iron infusion. - Continue follow-up in the  Hematology/Oncology clinic - No further endoscopic evaluation needed at this time - I suppose it is possible that she does have underlying Celiac Disease.  Sister with Celiac Disease, but patient has otherwise been maintaining GFD for many years.  Serologic testing would not be reliable.  Could consider HLA-DQ2/DQ 8 testing, but treatment would largely be the same with GFD  2) History of colon polyps Colonoscopy in 10/2023 with 12 mm pedunculated sigmoid adenoma, along with 4 other small, subcentimeter polyps which were not resected due to anticoagulation.  She did have a self-limiting bleed after her EGD/colonoscopy, suspected to be post polypectomy bleed in the setting of Arixtra  (vs hemorrhoidal bleed), but not requiring hospital admission. - Discussed timing of repeat colonoscopy with patient today.  Based on the 12 mm adenoma resected, typical recommendation would be repeat in 3 years.  However, there are the 4 small, subcentimeter polyps that were not resected.  She does not wish to undergo repeat colonoscopy anytime soon for resection of those known polyps (with anticoagulation hold).  Per conversation, will tentatively plan for repeat colonoscopy in 3 years for continued surveillance  3) History of endometrial cancer - Good response to therapy - Continue scheduled follow-up with Dr. Timmy   Can otherwise follow-up with me in the GI clinic on an as-needed basis  I spent 30 minutes of time, including in depth chart review, independent review of results as outlined above, communicating results with the patient directly, face-to-face time with the patient, coordinating care, and ordering studies and medications as appropriate, and documentation.       Sandor LULLA Flatter ,DO, FACG 12/18/2023, 2:33 PM

## 2023-12-18 NOTE — Patient Instructions (Addendum)
 _______________________________________________________  If your blood pressure at your visit was 140/90 or greater, please contact your primary care physician to follow up on this. _______________________________________________________  If you are age 64 or older, your body mass index should be between 23-30. Your Body mass index is 21.65 kg/m. If this is out of the aforementioned range listed, please consider follow up with your Primary Care Provider.  If you are age 29 or younger, your body mass index should be between 19-25. Your Body mass index is 21.65 kg/m. If this is out of the aformentioned range listed, please consider follow up with your Primary Care Provider.   ________________________________________________________  The [facility] providers would like to encourage you to use MYCHART to communicate with providers for non-urgent requests or questions.  Due to long hold times on the telephone, sending your provider a message by [facility] may be a faster and more efficient way to get a response.  Please allow 48 business hours for a response.  Please remember that this is for non-urgent requests.  _______________________________________________________  Rosine will follow up in our office on an as needed basis.  It was a pleasure to see you today!  Vito Cirigliano, D.O.

## 2023-12-20 ENCOUNTER — Telehealth: Payer: Self-pay | Admitting: *Deleted

## 2023-12-20 NOTE — Telephone Encounter (Signed)
 Received call from pharmacist at [facility] with needing clarification of quantity to dispense. Dose is 5mg /0.4 mls subcutaneous daily. Instructed her that Dr. Timmy is out of the office this week, but with both of us  calculating, a month supply should be 12 mls. She will change the amount of mls to dispense and I told her I would chart the message so, next time it needs to be refilled we will know.

## 2023-12-25 ENCOUNTER — Other Ambulatory Visit: Payer: Self-pay

## 2023-12-28 ENCOUNTER — Inpatient Hospital Stay

## 2023-12-28 ENCOUNTER — Ambulatory Visit

## 2023-12-28 ENCOUNTER — Inpatient Hospital Stay (HOSPITAL_BASED_OUTPATIENT_CLINIC_OR_DEPARTMENT_OTHER): Admitting: Hematology & Oncology

## 2023-12-28 ENCOUNTER — Inpatient Hospital Stay: Attending: Hematology & Oncology

## 2023-12-28 ENCOUNTER — Encounter: Payer: Self-pay | Admitting: Hematology & Oncology

## 2023-12-28 VITALS — BP 102/53 | HR 46 | Temp 97.9°F | Resp 18 | Ht 64.0 in | Wt 124.1 lb

## 2023-12-28 DIAGNOSIS — Z79620 Long term (current) use of immunosuppressive biologic: Secondary | ICD-10-CM | POA: Insufficient documentation

## 2023-12-28 DIAGNOSIS — C541 Malignant neoplasm of endometrium: Secondary | ICD-10-CM

## 2023-12-28 DIAGNOSIS — Z7901 Long term (current) use of anticoagulants: Secondary | ICD-10-CM | POA: Diagnosis not present

## 2023-12-28 DIAGNOSIS — I82401 Acute embolism and thrombosis of unspecified deep veins of right lower extremity: Secondary | ICD-10-CM

## 2023-12-28 DIAGNOSIS — D509 Iron deficiency anemia, unspecified: Secondary | ICD-10-CM | POA: Insufficient documentation

## 2023-12-28 DIAGNOSIS — Z8673 Personal history of transient ischemic attack (TIA), and cerebral infarction without residual deficits: Secondary | ICD-10-CM | POA: Diagnosis not present

## 2023-12-28 DIAGNOSIS — D72819 Decreased white blood cell count, unspecified: Secondary | ICD-10-CM | POA: Insufficient documentation

## 2023-12-28 DIAGNOSIS — E063 Autoimmune thyroiditis: Secondary | ICD-10-CM

## 2023-12-28 LAB — CMP (CANCER CENTER ONLY)
ALT: 15 U/L (ref 0–44)
AST: 17 U/L (ref 15–41)
Albumin: 4.8 g/dL (ref 3.5–5.0)
Alkaline Phosphatase: 66 U/L (ref 38–126)
Anion gap: 8 (ref 5–15)
BUN: 21 mg/dL (ref 8–23)
CO2: 28 mmol/L (ref 22–32)
Calcium: 9.8 mg/dL (ref 8.9–10.3)
Chloride: 106 mmol/L (ref 98–111)
Creatinine: 0.8 mg/dL (ref 0.44–1.00)
GFR, Estimated: >60 mL/min (ref >60)
Glucose, Bld: 90 mg/dL (ref 70–99)
Potassium: 4.3 mmol/L (ref 3.5–5.1)
Sodium: 142 mmol/L (ref 135–145)
Total Bilirubin: 1.7 mg/dL — ABNORMAL HIGH (ref 0.0–1.2)
Total Protein: 6.6 g/dL (ref 6.5–8.1)

## 2023-12-28 LAB — IRON AND IRON BINDING CAPACITY (CC-WL,HP ONLY)
Iron: 213 ug/dL — ABNORMAL HIGH (ref 28–170)
Saturation Ratios: 67 % — ABNORMAL HIGH (ref 10.4–31.8)
TIBC: 319 ug/dL (ref 250–450)
UIBC: 106 ug/dL — ABNORMAL LOW (ref 148–442)

## 2023-12-28 LAB — CBC WITH DIFFERENTIAL (CANCER CENTER ONLY)
Abs Immature Granulocytes: 0 K/uL (ref 0.00–0.07)
Basophils Absolute: 0 K/uL (ref 0.0–0.1)
Basophils Relative: 1 %
Eosinophils Absolute: 0 K/uL (ref 0.0–0.5)
Eosinophils Relative: 0 %
HCT: 32.9 % — ABNORMAL LOW (ref 36.0–46.0)
Hemoglobin: 10.7 g/dL — ABNORMAL LOW (ref 12.0–15.0)
Immature Granulocytes: 0 %
Lymphocytes Relative: 39 %
Lymphs Abs: 1.1 K/uL (ref 0.7–4.0)
MCH: 33.6 pg (ref 26.0–34.0)
MCHC: 32.5 g/dL (ref 30.0–36.0)
MCV: 103.5 fL — ABNORMAL HIGH (ref 80.0–100.0)
Monocytes Absolute: 0.3 K/uL (ref 0.1–1.0)
Monocytes Relative: 12 %
Neutro Abs: 1.3 K/uL — ABNORMAL LOW (ref 1.7–7.7)
Neutrophils Relative %: 48 %
Platelet Count: 258 K/uL (ref 150–400)
RBC: 3.18 MIL/uL — ABNORMAL LOW (ref 3.87–5.11)
RDW: 13.8 % (ref 11.5–15.5)
WBC Count: 2.7 K/uL — ABNORMAL LOW (ref 4.0–10.5)
nRBC: 0 % (ref 0.0–0.2)

## 2023-12-28 LAB — FERRITIN: Ferritin: 309 ng/mL — ABNORMAL HIGH (ref 11–307)

## 2023-12-28 LAB — TSH: TSH: 2.04 u[IU]/mL (ref 0.350–4.500)

## 2023-12-28 LAB — D-DIMER, QUANTITATIVE: D-Dimer, Quant: <0.27 ug{FEU}/mL (ref 0.00–0.50)

## 2023-12-28 MED ORDER — SODIUM CHLORIDE 0.9% FLUSH
10.0000 mL | INTRAVENOUS | Status: DC | PRN
  Administered 2023-12-28: 10 mL

## 2023-12-28 MED ORDER — ALTEPLASE 2 MG IJ SOLR: 2.0000 mg | Freq: Once | INTRAMUSCULAR | Status: DC | PRN

## 2023-12-28 MED ORDER — HEPARIN SOD (PORK) LOCK FLUSH 100 UNIT/ML IV SOLN
500.0000 [IU] | Freq: Once | INTRAVENOUS | Status: AC | PRN
Start: 2023-12-28 — End: 2023-12-28
  Administered 2023-12-28: 500 [IU]

## 2023-12-28 MED ORDER — SODIUM CHLORIDE 0.9 % IV SOLN
Freq: Once | INTRAVENOUS | Status: AC
Start: 2023-12-28 — End: 2023-12-28

## 2023-12-28 MED ORDER — SODIUM CHLORIDE 0.9 % IV SOLN
400.0000 mg | Freq: Once | INTRAVENOUS | Status: AC
  Administered 2023-12-28: 400 mg via INTRAVENOUS
  Filled 2023-12-28: qty 16

## 2023-12-28 MED ORDER — SODIUM CHLORIDE 0.9% FLUSH: 3.0000 mL | INTRAVENOUS | Status: DC | PRN

## 2023-12-28 NOTE — Progress Notes (Signed)
 Hematology and Oncology Follow Up Visit  Sherry Williams [id number] 1960/06/26 63 y.o. 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  So far, it seems like the bleeding has stopped.  We never could find out where she was bleeding from.  She presented a few months ago with severe iron deficiency.  She has seen Gastroenterology.  She has had endoscopy, colonoscopy and capsule endoscopy.  Everything was really unremarkable.  With the colonoscopy she had some small polyps.  She, thankfully, has not had any issues with respect to the endometrial carcinoma.  Her last CA-125 was 10.  She has had no abdominal pain.  She has had no headache.  She has had no leg swelling.  She has had no cough or shortness of breath.  There has been no obvious melena or bright red blood per rectum..  She is on Arixtra .  We really have to keep her on the Arixtra  as she is at risk for thromboembolic disease secondary to her underlying malignancy.  Currently, her iron studies show ferritin of 309 with an iron saturation of 67%.  Overall, I would say that her performance status is probably ECOG 0.     Medications:  Current Outpatient Medications:    Ascorbic Acid (VITAMIN C) 1000 MG tablet, Take 2,000 mg by mouth in the morning and at bedtime. Also receives Vitamin C infusion 75 mg/week, Disp: , Rfl:    ASTRAGALUS PO, Take by mouth. Takes 2 caps BID., Disp: , Rfl:    Berberine Chloride (BERBERINE HCI PO), Take  3 capsules by mouth daily., Disp: , Rfl:    Boswellia Serrata (BOSWELLIA PO), Take 2 capsules by mouth daily. Frankincense, Disp: , Rfl:    castor oil liquid, PRN lymph discomfort, Topical, Disp: , Rfl:    CHLOROPHYLL PO, Take by mouth. One teaspoon prior to coffee enemas., Disp: , Rfl:    cimetidine (TAGAMET) 400 MG tablet, Take 400 mg by mouth 2 (two) times daily. 03/23/2023 Only takes M-F., Disp: , Rfl:    co-enzyme Q-10 30 MG capsule, Take 100 mg by mouth 2 (two) times daily., Disp: , Rfl:    doxycycline (MONODOX) 100 MG capsule, Take 100 mg by mouth 2 (two) times daily., Disp: , Rfl:    fondaparinux  (ARIXTRA ) 5 MG/0.4ML SOLN injection, Inject 0.4 mLs (5 mg total) into the skin daily., Disp: 2 mL, Rfl: 2   Grape Seed Extract 100 MG CAPS, Take 1 capsule by mouth in the morning and at bedtime., Disp: , Rfl:    Green Tea, Camellia sinensis, (GREEN TEA EXTRACT PO), Take 2 capsules by mouth 2 (two) times daily., Disp: , Rfl:    ivermectin (STROMECTOL) 3 MG TABS tablet, Take 150 mcg/kg by mouth in the morning and at bedtime., Disp: , Rfl:    lidocaine -prilocaine  (EMLA ) cream, APPLY TOPICALLY TO THE AFFECTED AREA 1 TIME, Disp: 30 g, Rfl: 4   MELATONIN ER PO, Take 180 mg by mouth at bedtime., Disp: ,  Rfl:    MILK THISTLE PLUS PO, Take 3 tablets by mouth at bedtime. Dandelion, Disp: , Rfl:    Naltrexone HCl, Pain, (NALTREX) 4.5 MG CAPS, Take by mouth at bedtime., Disp: , Rfl:    Omega-3 Fatty Acids (FISH OIL) 300 MG CAPS, Take 1 capsule by mouth in the morning and at bedtime., Disp: , Rfl:    ondansetron  (ZOFRAN ) 8 MG tablet, Take 1 tablet (8 mg total) by mouth every 8 (eight) hours as needed for nausea or vomiting., Disp: 30 tablet, Rfl: 1   OVER THE COUNTER MEDICATION, Coffee enemas-2 x weekly., Disp: , Rfl:    OVER THE COUNTER MEDICATION, Natto Serrazime - Three times daily., Disp: , Rfl:    OVER THE COUNTER MEDICATION, Rebounding prn lymph swelling, Disp: , Rfl:    OVER THE COUNTER MEDICATION,  Sauna 2 times weekly., Disp: , Rfl:    OVER THE COUNTER MEDICATION, [place] Pini, thigh injections 1/week until reaction then increase dose 09-20-22, pause for 2 Keytruda  cycles, then resume 3/weekly on non IV Vitamin C days., Disp: , Rfl:    OVER THE COUNTER MEDICATION, Theracurmin 4 daily with food., Disp: , Rfl:    Pectin Cit-Inos-C-Bioflav-Soy (MODIFIED CITRUS PECTIN PO), Take by mouth. One scoop BID, Disp: , Rfl:    PRESCRIPTION MEDICATION, There-Biotic Women's  1-2 daily, Disp: , Rfl:    Probiotic Product (PROBIOTIC BLEND PO), Take by mouth daily., Disp: , Rfl:    QUERCETIN PO, Take by mouth. Quercetin Phytosome 250mg  - two daily., Disp: , Rfl:    Red Yeast Rice Extract (RED YEAST RICE PO), Take 1 capsule by mouth in the morning and at bedtime., Disp: , Rfl:    SELENIUM PO, Take 800 mg by mouth daily., Disp: , Rfl:    thyroid  (ARMOUR) 90 MG tablet, Take 90 mg by mouth every morning., Disp: , Rfl:    UNABLE TO FIND, Take 1 tablet by mouth in the morning and at bedtime. Coriolus-4 capsules BID., Disp: , Rfl:    vitamin E 180 MG (400 UNITS) capsule, Take 400 Units by mouth daily. Takes 3 x weekly., Disp: , Rfl:   Allergies:  Allergies  Allergen Reactions   Bee Venom Anaphylaxis, Swelling and Other (See Comments)   Aspartame Diarrhea   Aspartame And Phenylalanine Diarrhea   Adhesive [Tape] Other (See Comments)    Irritation and red    Past Medical History, Surgical history, Social history, and Family History were reviewed and updated.  Review of Systems: Review of Systems  Constitutional: Negative.   HENT: Negative.    Eyes: Negative.   Respiratory: Negative.    Cardiovascular: Negative.   Gastrointestinal:  Positive for abdominal pain.  Genitourinary: Negative.   Musculoskeletal: Negative.   Skin: Negative.   Neurological: Negative.   Endo/Heme/Allergies: Negative.   Psychiatric/Behavioral: Negative.      Physical Exam: Her vital signs show temperature of 97.9.  Pulse 46.   Blood pressure 102/53.  Weight is 124 pounds.     Wt Readings from Last 3 Encounters:  12/28/23 124 lb 1.9 oz (56.3 kg)  12/18/23 128 lb 2 oz (58.1 kg)  11/30/23 125 lb (56.7 kg)    Physical Exam Vitals reviewed.  HENT:     Head: Normocephalic and atraumatic.  Eyes:     Pupils: Pupils are equal, round, and reactive to light.  Cardiovascular:     Rate and Rhythm: Normal rate and regular rhythm.     Heart sounds: Normal heart sounds.  Pulmonary:  Effort: Pulmonary effort is normal.     Breath sounds: Normal breath sounds.  Abdominal:     General: Bowel sounds are normal.     Palpations: Abdomen is soft.     Comments: Abdominal exam shows a soft abdomen.  She has well healing laparoscopic scars.  I think there are 5 laparoscopic scars.  She has no erythema or exudate or warmth associated with these.  She has no fluid wave in the abdomen.  There is no guarding or rebound tenderness.  She has no palpable liver or spleen tip.  Musculoskeletal:        General: No tenderness or deformity. Normal range of motion.     Cervical back: Normal range of motion.  Lymphadenopathy:     Cervical: No cervical adenopathy.  Skin:    General: Skin is warm and dry.     Findings: No erythema or rash.  Neurological:     Mental Status: She is alert and oriented to person, place, and time.  Psychiatric:        Behavior: Behavior normal.        Thought Content: Thought content normal.        Judgment: Judgment normal.      Lab Results  Component Value Date   WBC 2.7 (L) 12/28/2023   HGB 10.7 (L) 12/28/2023   HCT 32.9 (L) 12/28/2023   MCV 103.5 (H) 12/28/2023   PLT 258 12/28/2023     Chemistry      Component Value Date/Time   NA 142 12/28/2023 0908   NA 142 02/15/2017 1518   NA 140 04/13/2016 1055   K 4.3 12/28/2023 0908   K 4.1 02/15/2017 1518   K 4.1 04/13/2016 1055   CL 106 12/28/2023 0908   CL 107 02/15/2017 1518   CO2 28 12/28/2023 0908   CO2 29 02/15/2017 1518   CO2 27  04/13/2016 1055   BUN 21 12/28/2023 0908   BUN 17 02/15/2017 1518   BUN 22.1 04/13/2016 1055   CREATININE 0.80 12/28/2023 0908   CREATININE 1.0 02/15/2017 1518   CREATININE 0.8 04/13/2016 1055      Component Value Date/Time   CALCIUM  9.8 12/28/2023 0908   CALCIUM  9.3 02/15/2017 1518   CALCIUM  9.9 04/13/2016 1055   ALKPHOS 66 12/28/2023 0908   ALKPHOS 79 02/15/2017 1518   ALKPHOS 81 04/13/2016 1055   AST 17 12/28/2023 0908   AST 20 04/13/2016 1055   ALT 15 12/28/2023 0908   ALT 24 02/15/2017 1518   ALT 22 04/13/2016 1055   BILITOT 1.7 (H) 12/28/2023 0908   BILITOT 0.44 04/13/2016 1055      Impression and Plan: Sherry Williams is a 63 year old Caucasian female.  She has had another recurrence of her gynecologic malignancy.  We gave her chemotherapy.  She did incredibly well with chemotherapy.  She now is on maintenance immunotherapy.  I am just very happy that she is done so well.  The last PET scan showed that she has done incredibly well.  She is in remission.  Her hemoglobin is holding steady.  She clearly does not have any problems with iron deficiency at this point.    Of note, her erythropoietin  level is only 11.  As such, we can certainly consider ESA although she does have a history of thromboembolic disease.  Her white cell count has been trending downward.  We will have to watch this.  Her immune system still is adequate from my point of view.  She will go ahead with the Keytruda  today.  We will set her up with a PET scan in August.  I will then plan to see her back about a week afterwards.  I want to get her back before she [place] her [place] go out [place] 5 weeks.     Sherry JONELLE Crease, MD 7/18/202510:17 AM

## 2023-12-28 NOTE — Patient Instructions (Signed)

## 2023-12-28 NOTE — Progress Notes (Signed)
 OK to treat with ANC and Bilirubin level from today per Dr. Timmy.

## 2023-12-28 NOTE — Patient Instructions (Signed)
 [facility] - A DEPT OF [facility]  Discharge Instructions: Thank you for choosing [facility] to provide your oncology and hematology care.   If you have a lab appointment with the Cancer Center, please go directly to the Cancer Center and check in at the registration area.  Wear comfortable clothing and clothing appropriate for easy access to any Portacath or PICC line.   We strive to give you quality time with your provider. You may need to reschedule your appointment if you arrive late (15 or more minutes).  Arriving late affects you and other patients whose appointments are after yours.  Also, if you miss three or more appointments without notifying the office, you may be dismissed from the clinic at the provider's discretion.      For prescription refill requests, have your pharmacy contact our office and allow 72 hours for refills to be completed.    Today you received the following chemotherapy and/or immunotherapy agents Keytruda       To help prevent nausea and vomiting after your treatment, we encourage you to take your nausea medication as directed.  BELOW ARE SYMPTOMS THAT SHOULD BE REPORTED IMMEDIATELY: *FEVER GREATER THAN 100.4 F (38 C) OR HIGHER *CHILLS OR SWEATING *NAUSEA AND VOMITING THAT IS NOT CONTROLLED WITH YOUR NAUSEA MEDICATION *UNUSUAL SHORTNESS OF BREATH *UNUSUAL BRUISING OR BLEEDING *URINARY PROBLEMS (pain or burning when urinating, or frequent urination) *BOWEL PROBLEMS (unusual diarrhea, constipation, pain near the anus) TENDERNESS IN MOUTH AND THROAT WITH OR WITHOUT PRESENCE OF ULCERS (sore throat, sores in mouth, or a toothache) UNUSUAL RASH, SWELLING OR PAIN  UNUSUAL VAGINAL DISCHARGE OR ITCHING   Items with * indicate a potential emergency and should be followed up as soon as possible or go to the Emergency Department if any problems should occur.  Please show the CHEMOTHERAPY ALERT CARD or IMMUNOTHERAPY  ALERT CARD at check-in to the Emergency Department and triage nurse. Should you have questions after your visit or need to cancel or reschedule your appointment, please contact [facility] - A DEPT OF [facility]  [phone] and follow the prompts.  Office hours are 8:00 a.m. to 4:30 p.m. Monday - Friday. Please note that voicemails left after 4:00 p.m. may not be returned until the following business day.  We are closed weekends and major holidays. You have access to a nurse at all times for urgent questions. Please call the main number to the clinic [phone] and follow the prompts.  For any non-urgent questions, you may also contact your provider using MyChart. We now offer e-Visits for anyone 7 and older to request care online for non-urgent symptoms. For details visit [email or web address].   Also download the MyChart app! Go to the app store, search "MyChart", open the app, select [place], and log in with your MyChart username and password.

## 2023-12-29 ENCOUNTER — Ambulatory Visit: Payer: Self-pay | Admitting: Hematology & Oncology

## 2023-12-29 LAB — CA 125: Cancer Antigen (CA) 125: 11.8 U/mL (ref 0.0–38.1)

## 2024-01-03 ENCOUNTER — Encounter: Payer: Self-pay | Admitting: Family Medicine

## 2024-01-03 ENCOUNTER — Ambulatory Visit: Admitting: Family Medicine

## 2024-01-03 DIAGNOSIS — Z0000 Encounter for general adult medical examination without abnormal findings: Secondary | ICD-10-CM | POA: Diagnosis not present

## 2024-01-03 NOTE — Progress Notes (Signed)
 PATIENT CHECK-IN and HEALTH RISK ASSESSMENT QUESTIONNAIRE:  -completed by phone/video for upcoming Medicare Preventive Visit   Pre-Visit Check-in: 1)Vitals (height, wt, BP, etc) - record in vitals section for visit on day of visit Request home vitals (wt, BP, etc.) and enter into vitals, THEN update Vital Signs SmartPhrase below at the top of the HPI. See below.  2)Review and Update Medications, Allergies PMH, Surgeries, Social history in Epic 3)Hospitalizations in the last year with date/reason? n  4)Review and Update Care Team (patient's specialists) in Epic 5) Complete PHQ9 in Epic  6) Complete Fall Screening in Epic 7)Review all Health Maintenance Due and order if not done.  Medicare Wellness Patient Questionnaire:  Answer theses question about your habits: How often do you have a drink containing alcohol?2-3 How many drinks containing alcohol do you have on a typical day when you are drinking?1 How often do you have six or more drinks on one occasion?n Have you ever smoked?y very remotely in highschool. How many packs a day do/did you smoke? na Do you use smokeless tobacco?n Do you use an illicit drugs?n On average, how many days per week do you engage in moderate to strenuous exercise (like a brisk walk)?4 On average, how many minutes do you engage in exercise at this level?60-70 minutes, walks 4.5 miles, hikes a lot as well, does some bouncing on re-bounder Typical breakfast: eggs, gluten and dairy, avoid added sugar, fruit Typical lunch/dinner: proteins, veggies   Beverages: water , green tea, coffee Social connections: interacts with family/friends more than 3x per week per questionnaire, likes to travel, cam[place] and hike  Answer theses question about your everyday activities: Can you perform most household chores?y Are you deaf or have significant trouble hearing?n Do you feel that you have a problem with memory?n - no change Do you feel safe at home?y Last dentist visit?  Goes on a regular basis, went recently 8. Do you have any difficulty performing your everyday activities?n Do you have Advanced Directives in place (Living Will, Healthcare Power or Attorney)?  Last eye Exam and location? Went recently per her report  Do you currently use prescribed or non-prescribed narcotic or opioid pain medications?n  Do you have a history or close family history of breast, ovarian, tubal or peritoneal cancer or a family member with BRCA (breast cancer susceptibility 1 and 2) gene mutations? She has hx of endometrial cancer     ----------------------------------------------------------------------------------------------------------------------------------------------------------------------------------------------------------------------  Because this visit was a virtual/telehealth visit, some criteria may be missing or patient reported. Any vitals not documented were not able to be obtained and vitals that have been documented are patient reported.    MEDICARE ANNUAL PREVENTIVE VISIT WITH PROVIDER: (Welcome to Medicare, initial annual wellness or annual wellness exam)  Virtual Visit via Phone Note  I connected with Sherry Williams on 01/03/24 by phone and verified that I am speaking with the correct person using two identifiers. Prefers phone visit.   Location patient: home Location provider:work or home office Persons participating in the virtual visit: patient, provider  Concerns and/or follow up today: in remission for cancer and reports is doing well, s/p chemo   See HM section in Epic for other details of completed HM.    ROS: negative for report of fevers, unintentional weight loss, vision changes, vision loss, hearing loss or change, chest pain, sob, hemoptysis, melena, hematochezia, hematuria, falls, bleeding or bruising, thoughts of suicide or self harm, memory loss  Patient-completed extensive health risk assessment - reviewed and discussed with  the patient: See Health Risk Assessment completed with patient prior to the visit either above or in recent phone note. This was reviewed in detailed with the patient today and appropriate recommendations, orders and referrals were placed as needed per Summary below and patient instructions.   Review of Medical History: -PMH, PSH, Family History and current specialty and care providers reviewed and updated and listed below   Patient Care Team: Theophilus Andrews, Tully GRADE, MD as PCP - General (Internal Medicine) Viktoria Comer SAUNDERS, MD as PCP - Hematology/Oncology (Gynecologic Oncology) Viktoria Comer SAUNDERS, MD as Consulting Physician (Gynecologic Oncology)   Past Medical History:  Diagnosis Date   Abnormal genetic test    Allergy    Anemia    Arthritis    Blood transfusion without reported diagnosis    DVT (deep venous thrombosis) (HCC)    lower extremity    Endometrial ca [facility]) 03/14/2017   uterine cancer 2018   Family history of adverse reaction to anesthesia    children had N/V    Family history of breast cancer    Family history of pancreatic cancer    Family history of prostate cancer in father    Goals of care, counseling/discussion 12/10/2019   History of kidney stones    Hypothyroidism    Hashimoto   Lichen sclerosus    MRSA (methicillin resistant staph aureus) culture positive    Pneumonia    walking   Pulmonary embolism (HCC) 01/13/2016   After car trip   Stroke [facility])    multiple     2-16 -2021, 4-15- 2021 no defecits   do to blood clots    Past Surgical History:  Procedure Laterality Date   ABDOMINAL HYSTERECTOMY     BUBBLE STUDY  07/31/2019   Procedure: BUBBLE STUDY;  Surgeon: Pietro Redell RAMAN, MD;  Location: [facility] ENDOSCOPY;  Service: Cardiovascular;;   CYSTOSCOPY WITH RETROGRADE PYELOGRAM, URETEROSCOPY AND STENT PLACEMENT Left 05/18/2018   Procedure: CYSTOSCOPY WITH RETROGRADE PYELOGRAM, LEFT URETEROSCOPY AND LEFT URETERAL STENT PLACEMENT;  Surgeon: Cam Morene ORN, MD;  Location: [facility] ORS;  Service: Urology;  Laterality: Left;   CYSTOSCOPY WITH RETROGRADE PYELOGRAM, URETEROSCOPY AND STENT PLACEMENT Right 05/30/2018   Procedure: CYSTOSCOPY WITH RIGHT RETROGRADE PYELOGRAM, URETEROSCOPY LASER LITHOTRIPSY AND STENT PLACEMENT;  Surgeon: Cam Morene ORN, MD;  Location: [facility];  Service: Urology;  Laterality: Right;   HOLMIUM LASER APPLICATION Left 05/18/2018   Procedure: HOLMIUM LASER APPLICATION;  Surgeon: Cam Morene ORN, MD;  Location: [facility] ORS;  Service: Urology;  Laterality: Left;   HOLMIUM LASER APPLICATION Right 05/30/2018   Procedure: HOLMIUM LASER APPLICATION;  Surgeon: Cam Morene ORN, MD;  [facility];  Service: Urology;  Laterality: Right;   IR CV LINE INJECTION  09/22/2020   IR CV LINE INJECTION  12/25/2022   IR IMAGING GUIDED PORT INSERTION  01/22/2020   MOUTH SURGERY     ROBOTIC ASSISTED TOTAL HYSTERECTOMY WITH BILATERAL SALPINGO OOPHERECTOMY Bilateral 04/17/2017   Procedure: XI ROBOTIC ASSISTED TOTAL HYSTERECTOMY WITH BILATERAL SALPINGO OOPHORECTOMY WITH SENTINAL LYMPH NODE BIOPSY AND POSSIBLE LYMPHADENECTOMY;  Surgeon: Dodie Shadow, MD;  Location: [facility] ORS;  Service: Gynecology;  Laterality: Bilateral;   ROBOTIC PELVIC AND PARA-AORTIC LYMPH NODE DISSECTION N/A 10/21/2019   Procedure: XI ROBOTIC PELVIC AND PARA-AORTIC LYMPH NODE DISSECTION;  Surgeon: Viktoria Comer SAUNDERS, MD;  Location: [facility] ORS;  Service: Gynecology;  Laterality: N/A;   TEE WITHOUT CARDIOVERSION N/A 07/31/2019   Procedure: TRANSESOPHAGEAL ECHOCARDIOGRAM (TEE);  Surgeon: Pietro Redell  S, MD;  Location: [facility] ENDOSCOPY;  Service: Cardiovascular;  Laterality: N/A;    Social History   Socioeconomic History   Marital status: Married    Spouse name: Not on file   Number of children: 4   Years of education: Not on file   Highest education level: Bachelor's degree (e.g., BA, AB, BS)  Occupational History   Occupation: sales   Tobacco Use   Smoking status: Never   Smokeless tobacco: Never  Vaping Use   Vaping status: Never Used  Substance and Sexual Activity   Alcohol use: Yes    Alcohol/week: 1.0 standard drink of alcohol    Types: 1 Glasses of wine per week    Comment: occassional   Drug use: No   Sexual activity: Not Currently    Birth control/protection: Surgical  Other Topics Concern   Not on file  Social History Narrative   Not on file   Social Drivers of Health   Financial Resource Strain: Patient Declined (01/03/2024)   Overall Financial Resource Strain (CARDIA)    Difficulty of Paying Living Expenses: Patient declined  Food Insecurity: Patient Declined (01/03/2024)   Hunger Vital Sign    Worried About Running Out of Food in the Last Year: Patient declined    Ran Out of Food in the Last Year: Patient declined  Transportation Needs: Patient Declined (01/03/2024)   PRAPARE - Administrator, Civil Service (Medical): Patient declined    Lack of Transportation (Non-Medical): Patient declined  Physical Activity: Sufficiently Active (01/03/2024)   Exercise Vital Sign    Days of Exercise per Week: 4 days    Minutes of Exercise per Session: 70 min  Stress: Patient Declined (01/03/2024)   [employer] of Occupational Health - Occupational Stress Questionnaire    Feeling of Stress: Patient declined  Social Connections: Unknown (01/03/2024)   Social Connection and Isolation Panel    Frequency of Communication with Friends and Family: Patient declined    Frequency of Social Gatherings with Friends and Family: Patient declined    Attends Religious Services: Patient declined    Database administrator or Organizations: Patient declined    Attends Engineer, structural: Not on file    Marital Status: Patient declined  Catering manager Violence: Not on file    Family History  Problem Relation Age of Onset   Stroke Father    Alzheimer's disease Father    Hyperlipidemia Father     Prostate cancer Father    Cancer Father        in his nose cartiage   Deep vein thrombosis Sister    Other Sister        celiac sprue   Breast cancer Paternal Grandmother        In her 57s, deceased at 73; bilateral mastectomy    Arthritis Mother    Lung cancer Maternal Grandmother    Pancreatic cancer Maternal Grandmother        deceased in late 10s   Diabetes Paternal Grandfather    Pancreatic cancer Paternal Grandfather     Current Outpatient Medications on File Prior to Visit  Medication Sig Dispense Refill   Ascorbic Acid (VITAMIN C) 1000 MG tablet Take 2,000 mg by mouth in the morning and at bedtime. Also receives Vitamin C infusion 75 mg/week     ASTRAGALUS PO Take by mouth. Takes 2 caps BID.     Berberine Chloride (BERBERINE HCI PO) Take 3 capsules by mouth daily.  Boswellia Serrata (BOSWELLIA PO) Take 2 capsules by mouth daily. Frankincense     castor oil liquid PRN lymph discomfort, Topical     CHLOROPHYLL PO Take by mouth. One teaspoon prior to coffee enemas.     cimetidine (TAGAMET) 400 MG tablet Take 400 mg by mouth 2 (two) times daily. 03/23/2023 Only takes M-F.     co-enzyme Q-10 30 MG capsule Take 100 mg by mouth 2 (two) times daily.     doxycycline (MONODOX) 100 MG capsule Take 100 mg by mouth 2 (two) times daily.     fondaparinux  (ARIXTRA ) 5 MG/0.4ML SOLN injection Inject 0.4 mLs (5 mg total) into the skin daily. 2 mL 2   Grape Seed Extract 100 MG CAPS Take 1 capsule by mouth in the morning and at bedtime.     Green Tea, Camellia sinensis, (GREEN TEA EXTRACT PO) Take 2 capsules by mouth 2 (two) times daily.     ivermectin (STROMECTOL) 3 MG TABS tablet Take 150 mcg/kg by mouth in the morning and at bedtime.     lidocaine -prilocaine  (EMLA ) cream APPLY TOPICALLY TO THE AFFECTED AREA 1 TIME 30 g 4   MELATONIN ER PO Take 180 mg by mouth at bedtime.     MILK THISTLE PLUS PO Take 3 tablets by mouth at bedtime. Dandelion     Naltrexone HCl, Pain, (NALTREX) 4.5 MG  CAPS Take by mouth at bedtime.     Omega-3 Fatty Acids (FISH OIL) 300 MG CAPS Take 1 capsule by mouth in the morning and at bedtime.     ondansetron  (ZOFRAN ) 8 MG tablet Take 1 tablet (8 mg total) by mouth every 8 (eight) hours as needed for nausea or vomiting. 30 tablet 1   OVER THE COUNTER MEDICATION Coffee enemas-2 x weekly.     OVER THE COUNTER MEDICATION Natto Serrazime - Three times daily.     OVER THE COUNTER MEDICATION Rebounding prn lymph swelling     OVER THE COUNTER MEDICATION Sauna 2 times weekly.     OVER THE COUNTER MEDICATION [place] Pini, thigh injections 1/week until reaction then increase dose 09-20-22, pause for 2 Keytruda  cycles, then resume 3/weekly on non IV Vitamin C days.     OVER THE COUNTER MEDICATION Theracurmin 4 daily with food.     Pectin Cit-Inos-C-Bioflav-Soy (MODIFIED CITRUS PECTIN PO) Take by mouth. One scoop BID     PRESCRIPTION MEDICATION There-Biotic Women's  1-2 daily     Probiotic Product (PROBIOTIC BLEND PO) Take by mouth daily.     QUERCETIN PO Take by mouth. Quercetin Phytosome 250mg  - two daily.     Red Yeast Rice Extract (RED YEAST RICE PO) Take 1 capsule by mouth in the morning and at bedtime.     SELENIUM PO Take 800 mg by mouth daily.     thyroid  (ARMOUR) 90 MG tablet Take 90 mg by mouth every morning.     UNABLE TO FIND Take 1 tablet by mouth in the morning and at bedtime. Coriolus-4 capsules BID.     vitamin E 180 MG (400 UNITS) capsule Take 400 Units by mouth daily. Takes 3 x weekly.     No current facility-administered medications on file prior to visit.    Allergies  Allergen Reactions   Bee Venom Anaphylaxis, Swelling and Other (See Comments)   Aspartame Diarrhea   Aspartame And Phenylalanine Diarrhea   Adhesive [Tape] Other (See Comments)    Irritation and red       Physical Exam Vitals requested  from patient and listed below if patient had equipment and was able to obtain at home for this virtual visit: There were no vitals  filed for this visit. Estimated body mass index is 21.31 kg/m as calculated from the following:   Height as of 12/28/23: 5' 4 (1.626 m).   Weight as of 12/28/23: 124 lb 1.9 oz (56.3 kg).  EKG (optional): deferred due to virtual visit  GENERAL: alert, oriented, no acute distress detected, full vision exam deferred due to pandemic and/or virtual encounter  PSYCH/NEURO: pleasant and cooperative, no obvious depression or anxiety, speech and thought processing grossly intact, Cognitive function grossly intact  Flowsheet Row Office Visit from 09/10/2023 in [facility]  PHQ-9 Total Score 1        01/03/2024   10:47 AM 01/03/2024   10:10 AM 12/28/2023    9:42 AM 09/10/2023    2:43 PM  Depression screen PHQ 2/9  Decreased Interest 0 0 0 0  Down, Depressed, Hopeless 0 0 0 0  PHQ - 2 Score 0 0 0 0  Altered sleeping    0  Tired, decreased energy    1  Change in appetite    0  Feeling bad or failure about yourself     0  Trouble concentrating    0  Moving slowly or fidgety/restless    0  Suicidal thoughts    0  PHQ-9 Score    1       04/25/2022    9:49 AM 06/23/2022    9:25 AM 09/10/2023    2:42 PM 01/03/2024   10:10 AM 01/03/2024   10:47 AM  Fall Risk  Falls in the past year?   0 0 0  Was there an injury with Fall?   0 0 0  Fall Risk Category Calculator   0 0 0  (RETIRED) Patient Fall Risk Level Low fall risk  Low fall risk      Patient at Risk for Falls Due to    No Fall Risks   Fall risk Follow up   Falls evaluation completed Falls evaluation completed      Data saved with a previous flowsheet row definition     SUMMARY AND PLAN:  Encounter for Medicare annual wellness exam  Discussed applicable health maintenance/preventive health measures and advised and referred or ordered per patient preferences: -she declines mammogram and all vaccines Health Maintenance  Topic Date Due   Hepatitis C Screening  Never done   COVID-19 Vaccine (1)  01/19/2024 (Originally 07/16/1965)   Zoster Vaccines- Shingrix (1 of 2) 04/04/2024 (Originally [birth year]16/1979)   DTaP/Tdap/Td (1 - Tdap) 01/02/2025 (Originally 07/16/1979)   MAMMOGRAM  01/02/2025 (Originally 08/16/2014)   INFLUENZA VACCINE  01/11/2024   Medicare Annual Wellness (AWV)  01/02/2025   Colonoscopy  10/23/2033   HIV Screening  Completed   Hepatitis B Vaccines  Aged Out   HPV VACCINES  Aged Out   Meningococcal B Vaccine  Aged Out      Education and counseling on the following was provided based on the above review of health and a plan/checklist for the patient, along with additional information discussed, was provided for the patient in the patient instructions :   -Advised and counseled on a healthy lifestyle  -Reviewed patient's current diet. Advised and counseled on a whole foods based healthy diet. A summary of a healthy diet was provided in the Patient Instructions.  -reviewed patient's current physical activity level and discussed exercise  guidelines for adults. Discussed community resources and ideas for safe exercise at home to assist in meeting exercise guideline recommendations in a safe and healthy way. Encouraged to incorporate strength training twice a week.  -Advise yearly dental visits at minimum and regular eye exams   Follow up: see patient instructions     Patient Instructions  I really enjoyed getting to talk with you today! I am available on Tuesdays and Thursdays for virtual visits if you have any questions or concerns, or if I can be of any further assistance.   CHECKLIST FROM ANNUAL WELLNESS VISIT:  -Follow up (please call to schedule if not scheduled after visit):   -yearly for annual wellness visit with primary care office  Here is a list of your preventive care/health maintenance measures and the plan for each if any are due:  PLAN For any measures below that may be due:    1. Can request hepatitis C screening at any point if you would like to  do.  Health Maintenance  Topic Date Due   Hepatitis C Screening  Never done   COVID-19 Vaccine (1) 01/19/2024 - pt declined   Zoster Vaccines- Shingrix (1 of 2) 04/04/2024  - patient declined   DTaP/Tdap/Td (1 - Tdap) 01/02/2025 - patient declined   MAMMOGRAM  01/02/2025  - patient declined   INFLUENZA VACCINE  01/11/2024   Medicare Annual Wellness (AWV)  01/02/2025   Colonoscopy  10/23/2033   HIV Screening  Completed   Hepatitis B Vaccines  Aged Out   HPV VACCINES  Aged Out   Meningococcal B Vaccine  Aged Out    -See a dentist at least yearly  -Get your eyes checked and then per your eye specialist's recommendations  -Other issues addressed today:   -I have included below further information regarding a healthy whole foods based diet, physical activity guidelines for adults, stress management and opportunities for social connections. I hope you find this information useful.   -----------------------------------------------------------------------------------------------------------------------------------------------------------------------------------------------------------------------------------------------------------    NUTRITION: -eat real food: lots of colorful vegetables (half the plate) and fruits -5-7 servings of vegetables and fruits per day (fresh or steamed is best), exp. 2 servings of vegetables with lunch and dinner and 2 servings of fruit per day. Berries and greens such as kale and collards are great choices.  -consume on a regular basis:  fresh fruits, fresh veggies, fish, nuts, seeds, healthy oils (such as olive oil, avocado oil), whole grains (make sure for bread/pasta/crackers/etc., that the first ingredient on label contains the word whole), legumes. -can eat small amounts of dairy and lean meat (no larger than the palm of your hand), but avoid processed meats such as ham, bacon, lunch meat, etc. -drink water  -try to avoid fast food and pre-packaged  foods, processed meat, ultra processed foods/beverages (donuts, candy, etc.) -most experts advise limiting sodium to < 2300mg  per day, should limit further is any chronic conditions such as high blood pressure, heart disease, diabetes, etc. The American Heart Association advised that < 1500mg  is is ideal -try to avoid foods/beverages that contain any ingredients with names you do not recognize  -try to avoid foods/beverages  with added sugar or sweeteners/sweets  -try to avoid sweet drinks (including diet drinks): soda, juice, Gatorade, sweet tea, power drinks, diet drinks -try to avoid white rice, white bread, pasta (unless whole grain)  EXERCISE GUIDELINES FOR ADULTS: -if you wish to increase your physical activity, do so gradually and with the approval of your doctor -STOP and seek medical  care immediately if you have any chest pain, chest discomfort or trouble breathing when starting or increasing exercise  -move and stretch your body, legs, feet and arms when sitting for long periods -Physical activity guidelines for optimal health in adults: -get at least 150 minutes per week of moderate exercise (can talk, but not sing); this is about 20-30 minutes of sustained activity 5-7 days per week or two 10-15 minute episodes of sustained activity 5-7 days per week -do some muscle building/resistance training/strength training at least 2 days per week  -balance exercises 3+ days per week:   Stand somewhere where you have something sturdy to hold onto if you lose balance    1) lift up on toes, then back down, start with 5x per day and work up to 20x   2) stand and lift one leg straight out to the side so that foot is a few inches of the floor, start with 5x each side and work up to 20x each side   3) stand on one foot, start with 5 seconds each side and work up to 20 seconds on each side  If you need ideas or help with getting more active:  -Silver  sneakers https://tools.silversneakers.com  -Walk with a Doc: http://www.duncan-williams.com/  -try to include resistance (weight lifting/strength building) and balance exercises twice per week: or the following link for ideas: http://castillo-powell.com/  BuyDucts.dk  STRESS MANAGEMENT: -can try meditating, or just sitting quietly with deep breathing while intentionally relaxing all parts of your body for 5 minutes daily -if you need further help with stress, anxiety or depression please follow up with your primary doctor or contact the wonderful folks at [facility]: [phone]  SOCIAL CONNECTIONS: -options in [place] if you wish to engage in more social and exercise related activities:  -Silver sneakers https://tools.silversneakers.com  -Walk with a Doc: http://www.duncan-williams.com/  -Check out the [facility] Active Adults 50+ section on the [place] of [employer] (hiking clubs, book clubs, cards and games, chess, exercise classes, aquatic classes and much more) - see the website for details: https://www[place].gov/departments/parks-recreation/active-adults50  -YouTube has lots of exercise videos for different ages and abilities as well  -[facility] (a variety of indoor and outdoor inperson activities for adults). (936)349-1987. [address].  -Virtual Online Classes (a variety of topics): see seniorplanet.org or call [phone]  -consider volunteering at a school, hospice center, church, senior center or elsewhere            Sherry JONELLE Cramp, DO

## 2024-01-03 NOTE — Patient Instructions (Addendum)
 I really enjoyed getting to talk with you today! I am available on Tuesdays and Thursdays for virtual visits if you have any questions or concerns, or if I can be of any further assistance.   CHECKLIST FROM ANNUAL WELLNESS VISIT:  -Follow up (please call to schedule if not scheduled after visit):   -yearly for annual wellness visit with primary care office  Here is a list of your preventive care/health maintenance measures and the plan for each if any are due:  PLAN For any measures below that may be due:    1. Can request hepatitis C screening at any point if you would like to do.  Health Maintenance  Topic Date Due   Hepatitis C Screening  Never done   COVID-19 Vaccine (1) 01/19/2024 - pt declined   Zoster Vaccines- Shingrix (1 of 2) 04/04/2024  - patient declined   DTaP/Tdap/Td (1 - Tdap) 01/02/2025 - patient declined   MAMMOGRAM  01/02/2025  - patient declined   INFLUENZA VACCINE  01/11/2024   Medicare Annual Wellness (AWV)  01/02/2025   Colonoscopy  10/23/2033   HIV Screening  Completed   Hepatitis B Vaccines  Aged Out   HPV VACCINES  Aged Out   Meningococcal B Vaccine  Aged Out    -See a dentist at least yearly  -Get your eyes checked and then per your eye specialist's recommendations  -Other issues addressed today:   -I have included below further information regarding a healthy whole foods based diet, physical activity guidelines for adults, stress management and opportunities for social connections. I hope you find this information useful.   -----------------------------------------------------------------------------------------------------------------------------------------------------------------------------------------------------------------------------------------------------------    NUTRITION: -eat real food: lots of colorful vegetables (half the plate) and fruits -5-7 servings of vegetables and fruits per day (fresh or steamed is best), exp. 2 servings  of vegetables with lunch and dinner and 2 servings of fruit per day. Berries and greens such as kale and collards are great choices.  -consume on a regular basis:  fresh fruits, fresh veggies, fish, nuts, seeds, healthy oils (such as olive oil, avocado oil), whole grains (make sure for bread/pasta/crackers/etc., that the first ingredient on label contains the word whole), legumes. -can eat small amounts of dairy and lean meat (no larger than the palm of your hand), but avoid processed meats such as ham, bacon, lunch meat, etc. -drink water  -try to avoid fast food and pre-packaged foods, processed meat, ultra processed foods/beverages (donuts, candy, etc.) -most experts advise limiting sodium to < 2300mg  per day, should limit further is any chronic conditions such as high blood pressure, heart disease, diabetes, etc. The American Heart Association advised that < 1500mg  is is ideal -try to avoid foods/beverages that contain any ingredients with names you do not recognize  -try to avoid foods/beverages  with added sugar or sweeteners/sweets  -try to avoid sweet drinks (including diet drinks): soda, juice, Gatorade, sweet tea, power drinks, diet drinks -try to avoid white rice, white bread, pasta (unless whole grain)  EXERCISE GUIDELINES FOR ADULTS: -if you wish to increase your physical activity, do so gradually and with the approval of your doctor -STOP and seek medical care immediately if you have any chest pain, chest discomfort or trouble breathing when starting or increasing exercise  -move and stretch your body, legs, feet and arms when sitting for long periods -Physical activity guidelines for optimal health in adults: -get at least 150 minutes per week of moderate exercise (can talk, but not sing); this is about 20-30  minutes of sustained activity 5-7 days per week or two 10-15 minute episodes of sustained activity 5-7 days per week -do some muscle building/resistance training/strength  training at least 2 days per week  -balance exercises 3+ days per week:   Stand somewhere where you have something sturdy to hold onto if you lose balance    1) lift up on toes, then back down, start with 5x per day and work up to 20x   2) stand and lift one leg straight out to the side so that foot is a few inches of the floor, start with 5x each side and work up to 20x each side   3) stand on one foot, start with 5 seconds each side and work up to 20 seconds on each side  If you need ideas or help with getting more active:  -Silver sneakers https://tools.silversneakers.com  -Walk with a Doc: http://www.duncan-williams.com/  -try to include resistance (weight lifting/strength building) and balance exercises twice per week: or the following link for ideas: http://castillo-powell.com/  BuyDucts.dk  STRESS MANAGEMENT: -can try meditating, or just sitting quietly with deep breathing while intentionally relaxing all parts of your body for 5 minutes daily -if you need further help with stress, anxiety or depression please follow up with your primary doctor or contact the wonderful folks at [facility]: [phone]  SOCIAL CONNECTIONS: -options in [place] if you wish to engage in more social and exercise related activities:  -Silver sneakers https://tools.silversneakers.com  -Walk with a Doc: http://www.duncan-williams.com/  -Check out the [facility] Active Adults 50+ section on the [place] of [employer] (hiking clubs, book clubs, cards and games, chess, exercise classes, aquatic classes and much more) - see the website for details: https://www.[place].gov/departments/parks-recreation/active-adults50  -YouTube has lots of exercise videos for different ages and abilities as well  -[facility] (a variety of indoor and outdoor inperson activities for adults).  [phone]. [address]  -Virtual Online Classes (a variety of topics): see seniorplanet.org or call [phone]  -consider volunteering at a school, hospice center, church, senior center or elsewhere

## 2024-01-14 ENCOUNTER — Encounter: Payer: Self-pay | Admitting: Gastroenterology

## 2024-01-25 ENCOUNTER — Encounter (HOSPITAL_COMMUNITY)
Admission: RE | Admit: 2024-01-25 | Discharge: 2024-01-25 | Disposition: A | Discharge status: Home / Self Care | Source: Ambulatory Visit | Attending: Hematology & Oncology | Admitting: Hematology & Oncology

## 2024-01-25 DIAGNOSIS — C541 Malignant neoplasm of endometrium: Secondary | ICD-10-CM | POA: Insufficient documentation

## 2024-01-25 LAB — GLUCOSE, CAPILLARY: Glucose-Capillary: 81 mg/dL (ref 70–99)

## 2024-01-25 MED ORDER — FLUDEOXYGLUCOSE F - 18 (FDG) INJECTION: 6.1000 | Freq: Once | INTRAVENOUS | Status: DC | PRN

## 2024-01-25 MED ORDER — FLUDEOXYGLUCOSE F - 18 (FDG) INJECTION
6.1500 | Freq: Once | INTRAVENOUS | Status: AC | PRN
  Administered 2024-01-25: 6.15 via INTRAVENOUS

## 2024-02-01 ENCOUNTER — Encounter: Payer: Self-pay | Admitting: Hematology & Oncology

## 2024-02-01 ENCOUNTER — Ambulatory Visit: Payer: Self-pay | Admitting: Hematology & Oncology

## 2024-02-01 NOTE — Telephone Encounter (Signed)
-----   Message from Maude JONELLE Crease sent at 02/01/2024 12:50 PM EDT ----- Please call and let her know that the PET scan does not show any active cancer.  This is fantastic.  Have a great weekend.  Jeralyn ----- Message ----- From: Rebecka, Rad Results In Sent: 02/01/2024  12:09 PM EDT To: Maude JONELLE Crease, MD

## 2024-02-01 NOTE — Telephone Encounter (Signed)
 Advised via MyChart.

## 2024-02-01 NOTE — Telephone Encounter (Signed)
 Called radiology reading room and asked for expedited read on PET scan due to upcoming appointment on 02/04/24.

## 2024-02-04 ENCOUNTER — Other Ambulatory Visit: Payer: Self-pay

## 2024-02-04 ENCOUNTER — Inpatient Hospital Stay

## 2024-02-04 ENCOUNTER — Encounter: Payer: Self-pay | Admitting: *Deleted

## 2024-02-04 ENCOUNTER — Inpatient Hospital Stay (HOSPITAL_BASED_OUTPATIENT_CLINIC_OR_DEPARTMENT_OTHER): Admitting: Hematology & Oncology

## 2024-02-04 ENCOUNTER — Encounter: Payer: Self-pay | Admitting: Hematology & Oncology

## 2024-02-04 ENCOUNTER — Inpatient Hospital Stay: Attending: Hematology & Oncology

## 2024-02-04 VITALS — BP 97/60 | HR 51 | Temp 98.0°F | Resp 18 | Ht 64.0 in | Wt 131.0 lb

## 2024-02-04 DIAGNOSIS — Z7901 Long term (current) use of anticoagulants: Secondary | ICD-10-CM | POA: Insufficient documentation

## 2024-02-04 DIAGNOSIS — Z8673 Personal history of transient ischemic attack (TIA), and cerebral infarction without residual deficits: Secondary | ICD-10-CM | POA: Insufficient documentation

## 2024-02-04 DIAGNOSIS — Z86718 Personal history of other venous thrombosis and embolism: Secondary | ICD-10-CM | POA: Diagnosis not present

## 2024-02-04 DIAGNOSIS — C541 Malignant neoplasm of endometrium: Secondary | ICD-10-CM | POA: Diagnosis present

## 2024-02-04 DIAGNOSIS — D509 Iron deficiency anemia, unspecified: Secondary | ICD-10-CM | POA: Insufficient documentation

## 2024-02-04 LAB — CBC WITH DIFFERENTIAL (CANCER CENTER ONLY)
Abs Immature Granulocytes: 0.01 K/uL (ref 0.00–0.07)
Basophils Absolute: 0 K/uL (ref 0.0–0.1)
Basophils Relative: 1 %
Eosinophils Absolute: 0 K/uL (ref 0.0–0.5)
Eosinophils Relative: 0 %
HCT: 32.8 % — ABNORMAL LOW (ref 36.0–46.0)
Hemoglobin: 10.9 g/dL — ABNORMAL LOW (ref 12.0–15.0)
Immature Granulocytes: 0 %
Lymphocytes Relative: 39 %
Lymphs Abs: 1.1 K/uL (ref 0.7–4.0)
MCH: 33.9 pg (ref 26.0–34.0)
MCHC: 33.2 g/dL (ref 30.0–36.0)
MCV: 101.9 fL — ABNORMAL HIGH (ref 80.0–100.0)
Monocytes Absolute: 0.4 K/uL (ref 0.1–1.0)
Monocytes Relative: 12 %
Neutro Abs: 1.4 K/uL — ABNORMAL LOW (ref 1.7–7.7)
Neutrophils Relative %: 48 %
Platelet Count: 229 K/uL (ref 150–400)
RBC: 3.22 MIL/uL — ABNORMAL LOW (ref 3.87–5.11)
RDW: 11.6 % (ref 11.5–15.5)
WBC Count: 2.9 K/uL — ABNORMAL LOW (ref 4.0–10.5)
nRBC: 0 % (ref 0.0–0.2)

## 2024-02-04 LAB — IRON AND IRON BINDING CAPACITY (CC-WL,HP ONLY)
Iron: 110 ug/dL (ref 28–170)
Saturation Ratios: 34 % — ABNORMAL HIGH (ref 10.4–31.8)
TIBC: 323 ug/dL (ref 250–450)
UIBC: 213 ug/dL

## 2024-02-04 LAB — CMP (CANCER CENTER ONLY)
ALT: 17 U/L (ref 0–44)
AST: 19 U/L (ref 15–41)
Albumin: 4.5 g/dL (ref 3.5–5.0)
Alkaline Phosphatase: 78 U/L (ref 38–126)
Anion gap: 12 (ref 5–15)
BUN: 16 mg/dL (ref 8–23)
CO2: 25 mmol/L (ref 22–32)
Calcium: 9.4 mg/dL (ref 8.9–10.3)
Chloride: 105 mmol/L (ref 98–111)
Creatinine: 0.67 mg/dL (ref 0.44–1.00)
GFR, Estimated: >60 mL/min (ref >60)
Glucose, Bld: 84 mg/dL (ref 70–99)
Potassium: 4.2 mmol/L (ref 3.5–5.1)
Sodium: 142 mmol/L (ref 135–145)
Total Bilirubin: 0.6 mg/dL (ref 0.0–1.2)
Total Protein: 6.2 g/dL — ABNORMAL LOW (ref 6.5–8.1)

## 2024-02-04 LAB — RETICULOCYTES
Immature Retic Fract: 10.4 % (ref 2.3–15.9)
RBC.: 3.23 MIL/uL — ABNORMAL LOW (ref 3.87–5.11)
Retic Count, Absolute: 85.3 K/uL (ref 19.0–186.0)
Retic Ct Pct: 2.6 % (ref 0.4–3.1)

## 2024-02-04 LAB — FERRITIN: Ferritin: 170 ng/mL (ref 11–307)

## 2024-02-04 NOTE — Progress Notes (Signed)
 Hematology and Oncology Follow Up Visit  Sherry Williams [id number] 1960-12-21 63 y.o. 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  She and her husband are getting ready for the big trip out west.  They will be gone for about 6 weeks.  They are leaving on Friday.  They are driving.  I am so happy for them..  She is doing well.  She had a PET scan that was just done.  The PET scan was done on 01/25/2024.  The PET scan did not show any evidence of active malignancy.  Her last CA-125 was 11.8.  Her anemia is slowly improving.  I still am not sure as to why she was so iron deficient.  We have given her IV iron.  She has had no bleeding.  She has had no nausea or vomiting.  She has had no cough or shortness of breath.  She and her husband had a wonderful family trip to the [place].  They really enjoyed it with their family.  She has had no leg pain or swelling.  She has had no rashes.  Overall, I would have to say that her performance status is probably ECOG 1.    Medications:  Current Outpatient Medications:    Ascorbic Acid (VITAMIN C) 1000 MG tablet, Take 2,000 mg by mouth in the morning and at bedtime. Also receives Vitamin C infusion 75 mg/week, Disp: , Rfl:    ASTRAGALUS PO, Take by mouth. Takes 2 caps BID., Disp: , Rfl:    Berberine Chloride (BERBERINE HCI PO), Take 3 capsules by mouth daily., Disp: , Rfl:    Boswellia Serrata (BOSWELLIA PO), Take 2 capsules by  mouth daily. Frankincense, Disp: , Rfl:    castor oil liquid, PRN lymph discomfort, Topical, Disp: , Rfl:    CHLOROPHYLL PO, Take by mouth. One teaspoon prior to coffee enemas., Disp: , Rfl:    cimetidine (TAGAMET) 400 MG tablet, Take 400 mg by mouth 2 (two) times daily. 03/23/2023 Only takes M-F., Disp: , Rfl:    co-enzyme Q-10 30 MG capsule, Take 100 mg by mouth 2 (two) times daily., Disp: , Rfl:    doxycycline (MONODOX) 100 MG capsule, Take 100 mg by mouth 2 (two) times daily., Disp: , Rfl:    fondaparinux  (ARIXTRA ) 5 MG/0.4ML SOLN injection, Inject 0.4 mLs (5 mg total) into the skin daily., Disp: 2 mL, Rfl: 2   Grape Seed Extract 100 MG CAPS, Take 1 capsule by mouth in the morning and at bedtime., Disp: , Rfl:    Green Tea, Camellia sinensis, (GREEN TEA EXTRACT PO), Take 2 capsules by mouth 2 (two) times daily., Disp: , Rfl:    ivermectin (STROMECTOL) 3 MG TABS tablet, Take 150 mcg/kg by mouth in the morning and at bedtime., Disp: , Rfl:    lidocaine -prilocaine  (EMLA ) cream, APPLY TOPICALLY TO THE AFFECTED AREA 1 TIME, Disp: 30 g, Rfl: 4   MELATONIN ER PO, Take 180 mg by mouth  at bedtime., Disp: , Rfl:    MILK THISTLE PLUS PO, Take 3 tablets by mouth at bedtime. Dandelion, Disp: , Rfl:    Naltrexone HCl, Pain, (NALTREX) 4.5 MG CAPS, Take by mouth at bedtime., Disp: , Rfl:    Omega-3 Fatty Acids (FISH OIL) 300 MG CAPS, Take 1 capsule by mouth in the morning and at bedtime., Disp: , Rfl:    ondansetron  (ZOFRAN ) 8 MG tablet, Take 1 tablet (8 mg total) by mouth every 8 (eight) hours as needed for nausea or vomiting., Disp: 30 tablet, Rfl: 1   OVER THE COUNTER MEDICATION, Coffee enemas-2 x weekly., Disp: , Rfl:    OVER THE COUNTER MEDICATION, Natto Serrazime - Three times daily., Disp: , Rfl:    OVER THE COUNTER MEDICATION, Rebounding prn lymph swelling, Disp: , Rfl:    OVER THE COUNTER MEDICATION, Sauna 2 times weekly., Disp: , Rfl:    OVER THE COUNTER MEDICATION, [place] Pini, thigh injections  1/week until reaction then increase dose 09-20-22, pause for 2 Keytruda  cycles, then resume 3/weekly on non IV Vitamin C days., Disp: , Rfl:    OVER THE COUNTER MEDICATION, Theracurmin 4 daily with food., Disp: , Rfl:    Pectin Cit-Inos-C-Bioflav-Soy (MODIFIED CITRUS PECTIN PO), Take by mouth. One scoop BID, Disp: , Rfl:    PRESCRIPTION MEDICATION, There-Biotic Women's  1-2 daily, Disp: , Rfl:    Probiotic Product (PROBIOTIC BLEND PO), Take by mouth daily., Disp: , Rfl:    QUERCETIN PO, Take by mouth. Quercetin Phytosome 250mg  - two daily., Disp: , Rfl:    Red Yeast Rice Extract (RED YEAST RICE PO), Take 1 capsule by mouth in the morning and at bedtime., Disp: , Rfl:    SELENIUM PO, Take 800 mg by mouth daily., Disp: , Rfl:    thyroid  (ARMOUR) 90 MG tablet, Take 90 mg by mouth every morning., Disp: , Rfl:    UNABLE TO FIND, Take 1 tablet by mouth in the morning and at bedtime. Coriolus-4 capsules BID., Disp: , Rfl:    vitamin E 180 MG (400 UNITS) capsule, Take 400 Units by mouth daily. Takes 3 x weekly., Disp: , Rfl:   Allergies:  Allergies  Allergen Reactions   Bee Venom Anaphylaxis, Swelling and Other (See Comments)   Aspartame Diarrhea   Aspartame And Phenylalanine Diarrhea   Adhesive [Tape] Other (See Comments)    Irritation and red    Past Medical History, Surgical history, Social history, and Family History were reviewed and updated.  Review of Systems: Review of Systems  Constitutional: Negative.   HENT: Negative.    Eyes: Negative.   Respiratory: Negative.    Cardiovascular: Negative.   Gastrointestinal:  Positive for abdominal pain.  Genitourinary: Negative.   Musculoskeletal: Negative.   Skin: Negative.   Neurological: Negative.   Endo/Heme/Allergies: Negative.   Psychiatric/Behavioral: Negative.      Physical Exam: Her vital signs show temperature of 98.  Pulse 51.  Blood pressure 97/60.  Weight is 131 pounds     Wt Readings from Last 3 Encounters:  02/04/24 131  lb (59.4 kg)  12/28/23 124 lb 1.9 oz (56.3 kg)  12/18/23 128 lb 2 oz (58.1 kg)    Physical Exam Vitals reviewed.  HENT:     Head: Normocephalic and atraumatic.  Eyes:     Pupils: Pupils are equal, round, and reactive to light.  Cardiovascular:     Rate and Rhythm: Normal rate and regular rhythm.     Heart sounds: Normal  heart sounds.  Pulmonary:     Effort: Pulmonary effort is normal.     Breath sounds: Normal breath sounds.  Abdominal:     General: Bowel sounds are normal.     Palpations: Abdomen is soft.     Comments: Abdominal exam shows a soft abdomen.  She has well healing laparoscopic scars.  I think there are 5 laparoscopic scars.  She has no erythema or exudate or warmth associated with these.  She has no fluid wave in the abdomen.  There is no guarding or rebound tenderness.  She has no palpable liver or spleen tip.  Musculoskeletal:        General: No tenderness or deformity. Normal range of motion.     Cervical back: Normal range of motion.  Lymphadenopathy:     Cervical: No cervical adenopathy.  Skin:    General: Skin is warm and dry.     Findings: No erythema or rash.  Neurological:     Mental Status: She is alert and oriented to person, place, and time.  Psychiatric:        Behavior: Behavior normal.        Thought Content: Thought content normal.        Judgment: Judgment normal.      Lab Results  Component Value Date   WBC 2.9 (L) 02/04/2024   HGB 10.9 (L) 02/04/2024   HCT 32.8 (L) 02/04/2024   MCV 101.9 (H) 02/04/2024   PLT 229 02/04/2024     Chemistry      Component Value Date/Time   NA 142 12/28/2023 0908   NA 142 02/15/2017 1518   NA 140 04/13/2016 1055   K 4.3 12/28/2023 0908   K 4.1 02/15/2017 1518   K 4.1 04/13/2016 1055   CL 106 12/28/2023 0908   CL 107 02/15/2017 1518   CO2 28 12/28/2023 0908   CO2 29 02/15/2017 1518   CO2 27 04/13/2016 1055   BUN 21 12/28/2023 0908   BUN 17 02/15/2017 1518   BUN 22.1 04/13/2016 1055    CREATININE 0.80 12/28/2023 0908   CREATININE 1.0 02/15/2017 1518   CREATININE 0.8 04/13/2016 1055      Component Value Date/Time   CALCIUM  9.8 12/28/2023 0908   CALCIUM  9.3 02/15/2017 1518   CALCIUM  9.9 04/13/2016 1055   ALKPHOS 66 12/28/2023 0908   ALKPHOS 79 02/15/2017 1518   ALKPHOS 81 04/13/2016 1055   AST 17 12/28/2023 0908   AST 20 04/13/2016 1055   ALT 15 12/28/2023 0908   ALT 24 02/15/2017 1518   ALT 22 04/13/2016 1055   BILITOT 1.7 (H) 12/28/2023 0908   BILITOT 0.44 04/13/2016 1055      Impression and Plan: Sherry Williams is a 63 year old Caucasian female.  She has had another recurrence of her gynecologic malignancy.  We gave her chemotherapy.  She did incredibly well with chemotherapy.  She now is on maintenance immunotherapy.  I am just very happy that she is done so well.  The recent  PET scan showed that she has done incredibly well.  She is in remission.  At this point, we will now move her treatments out to every 3 months.  She will get treated in October.  After that, she retreated the following year.  I am just happy that everything has gone so well.  She has a great quality of life.  I know she and her husband will have a wonderful time out west.  I told him to  make sure that they hydrate well.  Make sure they wear sunscreen.  We will see them back in October.   Maude JONELLE Crease, MD 8/25/20258:51 AM

## 2024-02-05 ENCOUNTER — Other Ambulatory Visit: Payer: Self-pay

## 2024-02-05 LAB — CA 125: Cancer Antigen (CA) 125: 9.6 U/mL (ref 0.0–38.1)

## 2024-02-07 ENCOUNTER — Other Ambulatory Visit: Payer: Self-pay

## 2024-02-10 ENCOUNTER — Other Ambulatory Visit: Payer: Self-pay

## 2024-02-27 ENCOUNTER — Other Ambulatory Visit

## 2024-02-27 ENCOUNTER — Ambulatory Visit: Admitting: Hematology & Oncology

## 2024-02-27 ENCOUNTER — Ambulatory Visit

## 2024-03-23 ENCOUNTER — Other Ambulatory Visit: Payer: Self-pay | Admitting: Hematology & Oncology

## 2024-03-24 ENCOUNTER — Encounter: Payer: Self-pay | Admitting: Hematology & Oncology

## 2024-03-24 ENCOUNTER — Encounter: Payer: Self-pay | Admitting: Family

## 2024-03-24 ENCOUNTER — Other Ambulatory Visit: Payer: Self-pay | Admitting: *Deleted

## 2024-03-24 DIAGNOSIS — C541 Malignant neoplasm of endometrium: Secondary | ICD-10-CM

## 2024-03-24 DIAGNOSIS — D509 Iron deficiency anemia, unspecified: Secondary | ICD-10-CM

## 2024-03-24 DIAGNOSIS — N200 Calculus of kidney: Secondary | ICD-10-CM

## 2024-03-24 DIAGNOSIS — E063 Autoimmune thyroiditis: Secondary | ICD-10-CM

## 2024-03-24 DIAGNOSIS — D649 Anemia, unspecified: Secondary | ICD-10-CM

## 2024-03-28 ENCOUNTER — Inpatient Hospital Stay

## 2024-03-28 ENCOUNTER — Encounter: Payer: Self-pay | Admitting: Hematology & Oncology

## 2024-03-28 ENCOUNTER — Inpatient Hospital Stay (HOSPITAL_BASED_OUTPATIENT_CLINIC_OR_DEPARTMENT_OTHER): Admitting: Hematology & Oncology

## 2024-03-28 ENCOUNTER — Inpatient Hospital Stay: Attending: Hematology & Oncology

## 2024-03-28 VITALS — BP 106/57 | HR 54 | Temp 97.8°F | Resp 20 | Ht 64.0 in | Wt 133.0 lb

## 2024-03-28 DIAGNOSIS — Z5112 Encounter for antineoplastic immunotherapy: Secondary | ICD-10-CM | POA: Insufficient documentation

## 2024-03-28 DIAGNOSIS — C541 Malignant neoplasm of endometrium: Secondary | ICD-10-CM | POA: Diagnosis not present

## 2024-03-28 DIAGNOSIS — Z86718 Personal history of other venous thrombosis and embolism: Secondary | ICD-10-CM | POA: Diagnosis not present

## 2024-03-28 DIAGNOSIS — Z8673 Personal history of transient ischemic attack (TIA), and cerebral infarction without residual deficits: Secondary | ICD-10-CM | POA: Insufficient documentation

## 2024-03-28 DIAGNOSIS — E063 Autoimmune thyroiditis: Secondary | ICD-10-CM | POA: Insufficient documentation

## 2024-03-28 DIAGNOSIS — Z79620 Long term (current) use of immunosuppressive biologic: Secondary | ICD-10-CM | POA: Insufficient documentation

## 2024-03-28 DIAGNOSIS — Z8639 Personal history of other endocrine, nutritional and metabolic disease: Secondary | ICD-10-CM | POA: Diagnosis not present

## 2024-03-28 DIAGNOSIS — D509 Iron deficiency anemia, unspecified: Secondary | ICD-10-CM

## 2024-03-28 DIAGNOSIS — E039 Hypothyroidism, unspecified: Secondary | ICD-10-CM | POA: Insufficient documentation

## 2024-03-28 DIAGNOSIS — D649 Anemia, unspecified: Secondary | ICD-10-CM

## 2024-03-28 DIAGNOSIS — N200 Calculus of kidney: Secondary | ICD-10-CM

## 2024-03-28 LAB — LIPID PANEL
Cholesterol: 176 mg/dL (ref 0–200)
HDL: 82 mg/dL (ref >40)
LDL Cholesterol: 82 mg/dL (ref 0–99)
Total CHOL/HDL Ratio: 2.1 ratio
Triglycerides: 58 mg/dL (ref <150)
VLDL: 12 mg/dL (ref 0–40)

## 2024-03-28 LAB — SEDIMENTATION RATE: Sed Rate: 14 mm/h (ref 0–22)

## 2024-03-28 LAB — CBC WITH DIFFERENTIAL (CANCER CENTER ONLY)
Abs Immature Granulocytes: 0.01 K/uL (ref 0.00–0.07)
Basophils Absolute: 0.1 K/uL (ref 0.0–0.1)
Basophils Relative: 2 %
Eosinophils Absolute: 0 K/uL (ref 0.0–0.5)
Eosinophils Relative: 0 %
HCT: 35.4 % — ABNORMAL LOW (ref 36.0–46.0)
Hemoglobin: 11.8 g/dL — ABNORMAL LOW (ref 12.0–15.0)
Immature Granulocytes: 0 %
Lymphocytes Relative: 36 %
Lymphs Abs: 1.2 K/uL (ref 0.7–4.0)
MCH: 34.2 pg — ABNORMAL HIGH (ref 26.0–34.0)
MCHC: 33.3 g/dL (ref 30.0–36.0)
MCV: 102.6 fL — ABNORMAL HIGH (ref 80.0–100.0)
Monocytes Absolute: 0.4 K/uL (ref 0.1–1.0)
Monocytes Relative: 11 %
Neutro Abs: 1.7 K/uL (ref 1.7–7.7)
Neutrophils Relative %: 51 %
Platelet Count: 234 K/uL (ref 150–400)
RBC: 3.45 MIL/uL — ABNORMAL LOW (ref 3.87–5.11)
RDW: 12.5 % (ref 11.5–15.5)
WBC Count: 3.3 K/uL — ABNORMAL LOW (ref 4.0–10.5)
nRBC: 0 % (ref 0.0–0.2)

## 2024-03-28 LAB — FERRITIN
Ferritin: 146 ng/mL (ref 11–307)
Ferritin: 250 ng/mL (ref 11–307)

## 2024-03-28 LAB — CMP (CANCER CENTER ONLY)
ALT: 20 U/L (ref 0–44)
AST: 29 U/L (ref 15–41)
Albumin: 4.7 g/dL (ref 3.5–5.0)
Alkaline Phosphatase: 90 U/L (ref 38–126)
Anion gap: 14 (ref 5–15)
BUN: 16 mg/dL (ref 8–23)
CO2: 25 mmol/L (ref 22–32)
Calcium: 9.8 mg/dL (ref 8.9–10.3)
Chloride: 104 mmol/L (ref 98–111)
Creatinine: 0.91 mg/dL (ref 0.44–1.00)
GFR, Estimated: >60 mL/min (ref >60)
Glucose, Bld: 79 mg/dL (ref 70–99)
Potassium: 4.4 mmol/L (ref 3.5–5.1)
Sodium: 142 mmol/L (ref 135–145)
Total Bilirubin: 0.8 mg/dL (ref 0.0–1.2)
Total Protein: 6.6 g/dL (ref 6.5–8.1)

## 2024-03-28 LAB — IRON AND IRON BINDING CAPACITY (CC-WL,HP ONLY)
Iron: 117 ug/dL (ref 28–170)
Saturation Ratios: 33 % — ABNORMAL HIGH (ref 10.4–31.8)
TIBC: 357 ug/dL (ref 250–450)
UIBC: 240 ug/dL

## 2024-03-28 LAB — T4, FREE: Free T4: 0.53 ng/dL — ABNORMAL LOW (ref 0.61–1.12)

## 2024-03-28 LAB — FOLATE: Folate: >20 ng/mL (ref >5.9)

## 2024-03-28 LAB — VITAMIN B12: Vitamin B-12: 547 pg/mL (ref 180–914)

## 2024-03-28 LAB — FIBRINOGEN: Fibrinogen: 311 mg/dL (ref 210–475)

## 2024-03-28 LAB — VITAMIN D 25 HYDROXY (VIT D DEFICIENCY, FRACTURES): Vit D, 25-Hydroxy: 87.82 ng/mL (ref 30–100)

## 2024-03-28 LAB — GAMMA GT: GGT: 13 U/L (ref 7–50)

## 2024-03-28 LAB — D-DIMER, QUANTITATIVE: D-Dimer, Quant: <0.27 ug{FEU}/mL (ref 0.00–0.50)

## 2024-03-28 LAB — TSH: TSH: 14.581 u[IU]/mL — ABNORMAL HIGH (ref 0.350–4.500)

## 2024-03-28 MED ORDER — SODIUM CHLORIDE 0.9 % IV SOLN
400.0000 mg | Freq: Once | INTRAVENOUS | Status: AC
  Administered 2024-03-28: 400 mg via INTRAVENOUS
  Filled 2024-03-28: qty 16

## 2024-03-28 MED ORDER — SODIUM CHLORIDE 0.9 % IV SOLN: Freq: Once | INTRAVENOUS | Status: AC

## 2024-03-28 MED ORDER — FONDAPARINUX SODIUM 5 MG/0.4ML ~~LOC~~ SOLN: 2.5000 mg | SUBCUTANEOUS | 5 refills | Status: AC

## 2024-03-28 NOTE — Patient Instructions (Signed)
 [facility] - A DEPT OF [facility]  Discharge Instructions: Thank you for choosing [facility] to provide your oncology and hematology care.   If you have a lab appointment with the Cancer Center, please go directly to the Cancer Center and check in at the registration area.  Wear comfortable clothing and clothing appropriate for easy access to any Portacath or PICC line.   We strive to give you quality time with your provider. You may need to reschedule your appointment if you arrive late (15 or more minutes).  Arriving late affects you and other patients whose appointments are after yours.  Also, if you miss three or more appointments without notifying the office, you may be dismissed from the clinic at the provider's discretion.      For prescription refill requests, have your pharmacy contact our office and allow 72 hours for refills to be completed.    Today you received the following chemotherapy and/or immunotherapy agents Rande Lawman       To help prevent nausea and vomiting after your treatment, we encourage you to take your nausea medication as directed.  BELOW ARE SYMPTOMS THAT SHOULD BE REPORTED IMMEDIATELY: *FEVER GREATER THAN 100.4 F (38 C) OR HIGHER *CHILLS OR SWEATING *NAUSEA AND VOMITING THAT IS NOT CONTROLLED WITH YOUR NAUSEA MEDICATION *UNUSUAL SHORTNESS OF BREATH *UNUSUAL BRUISING OR BLEEDING *URINARY PROBLEMS (pain or burning when urinating, or frequent urination) *BOWEL PROBLEMS (unusual diarrhea, constipation, pain near the anus) TENDERNESS IN MOUTH AND THROAT WITH OR WITHOUT PRESENCE OF ULCERS (sore throat, sores in mouth, or a toothache) UNUSUAL RASH, SWELLING OR PAIN  UNUSUAL VAGINAL DISCHARGE OR ITCHING   Items with * indicate a potential emergency and should be followed up as soon as possible or go to the Emergency Department if any problems should occur.  Please show the CHEMOTHERAPY ALERT CARD or IMMUNOTHERAPY  ALERT CARD at check-in to the Emergency Department and triage nurse. Should you have questions after your visit or need to cancel or reschedule your appointment, please contact [facility] - A DEPT OF [facility]  [phone] and follow the prompts.  Office hours are 8:00 a.m. to 4:30 p.m. Monday - Friday. Please note that voicemails left after 4:00 p.m. may not be returned until the following business day.  We are closed weekends and major holidays. You have access to a nurse at all times for urgent questions. Please call the main number to the clinic [phone] and follow the prompts.  For any non-urgent questions, you may also contact your provider using MyChart. We now offer e-Visits for anyone 65 and older to request care online for non-urgent symptoms. For details visit [email or web address].   Also download the MyChart app! Go to the app store, search "MyChart", open the app, select [place], and log in with your MyChart username and password.

## 2024-03-28 NOTE — Telephone Encounter (Signed)
-----   Message from Maude JONELLE Crease sent at 03/28/2024  2:59 PM EDT ----- Please call and let her her know that the thyroid  is underactive.  Her iron level is okay.  You will have to send the thyroid  results to her holistic doctor who manages her thyroid .  Thanks.  Jeralyn  ----- Message ----- From: Rebecka, Lab In [place] Sent: 03/28/2024   8:42 AM EDT To: Maude JONELLE Crease, MD

## 2024-03-28 NOTE — Progress Notes (Signed)
 Hematology and Oncology Follow Up Visit  Sherry Williams [id number] 08/16/1960 63 y.o. 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  Sherry Williams and her husband had a wonderful time out [place].  That a great time.  They did a lot of hiking.  They saw a lot of sites.  They saw a lot of wild animals.  So happy that Sherry Williams felt well.  Sherry Williams had little bit of fatigue but really began to feel better the more that Sherry Williams did out there.  They just got back I think a week or so ago.  Sherry Williams has had no issues with respect to her cancer.  So far, the last PET scan that Sherry Williams had done in April did not show any evidence of active malignancy.  Sherry Williams continues on her Arixtra .  We will try to decrease her dose if we can.  I think we can now try to move her Keytruda  out to every 3 months.  Her last CA-125 was down to 9.6.  Sherry Williams has had no problems with nausea or vomiting.  Sherry Williams has had a good appetite.  Sherry Williams has had no change in bowel or bladder habits.  There has been no headache.  Sherry Williams has had no bleeding.  Sherry Williams has had no leg swelling.  Overall, I would say that her performance status is probably ECOG 0.    Medications:  Current Outpatient Medications:    Ascorbic Acid (VITAMIN C) 1000 MG tablet, Take 2,000 mg by mouth in the morning and at bedtime. Also receives Vitamin C infusion 75 mg/week (Patient taking differently: Take 2,000 mg by mouth in the morning and at bedtime. Also  receives Vitamin C infusion 75 mg/month), Disp: , Rfl:    ASTRAGALUS PO, Take by mouth. Takes 2 caps BID., Disp: , Rfl:    Berberine Chloride (BERBERINE HCI PO), Take 3 capsules by mouth daily. (Patient taking differently: Take 2 capsules by mouth daily.), Disp: , Rfl:    Boswellia Serrata (BOSWELLIA PO), Take 2 capsules by mouth daily. Frankincense, Disp: , Rfl:    CHLOROPHYLL PO, Take by mouth. One teaspoon prior to coffee enemas., Disp: , Rfl:    cimetidine (TAGAMET) 400 MG tablet, Take 400 mg by mouth 2 (two) times daily. 03/23/2023 Only takes M-F., Disp: , Rfl:    co-enzyme Q-10 30 MG capsule, Take 100 mg by mouth 2 (two) times daily., Disp: , Rfl:    doxycycline (MONODOX) 100 MG capsule, Take 100 mg by mouth 2 (two) times daily., Disp: , Rfl:    fondaparinux  (ARIXTRA ) 5 MG/0.4ML SOLN injection, ADMINISTER 0.4MLS (5MG ) UNDER SKIN DAILY, Disp: 12 mL, Rfl: 5   Grape Seed Extract 100 MG CAPS, Take 1 capsule by mouth in the morning and at bedtime., Disp: , Rfl:    Green Tea, Camellia sinensis, (GREEN TEA EXTRACT PO), Take 2 capsules by mouth 2 (two) times daily., Disp: , Rfl:    ivermectin (STROMECTOL)  3 MG TABS tablet, Take 150 mcg/kg by mouth in the morning and at bedtime., Disp: , Rfl:    lidocaine -prilocaine  (EMLA ) cream, APPLY TOPICALLY TO THE AFFECTED AREA 1 TIME, Disp: 30 g, Rfl: 4   MELATONIN ER PO, Take 180 mg by mouth at bedtime., Disp: , Rfl:    MILK THISTLE PLUS PO, Take 3 tablets by mouth at bedtime. Dandelion, Disp: , Rfl:    Naltrexone HCl, Pain, (NALTREX) 4.5 MG CAPS, Take by mouth at bedtime., Disp: , Rfl:    Omega-3 Fatty Acids (FISH OIL) 300 MG CAPS, Take 1 capsule by mouth in the morning and at bedtime., Disp: , Rfl:    ondansetron  (ZOFRAN ) 8 MG tablet, Take 1 tablet (8 mg total) by mouth every 8 (eight) hours as needed for nausea or vomiting., Disp: 30 tablet, Rfl: 1   OVER THE COUNTER MEDICATION, Coffee enemas-2 x weekly., Disp: , Rfl:    OVER THE COUNTER MEDICATION, Natto  Serrazime - Three times daily., Disp: , Rfl:    OVER THE COUNTER MEDICATION, Rebounding prn lymph swelling, Disp: , Rfl:    OVER THE COUNTER MEDICATION, Mistletoe Pini, thigh injections 1/week until reaction then increase dose 09-20-22, pause for 2 Keytruda  cycles, then resume 3/weekly on non IV Vitamin C days., Disp: , Rfl:    OVER THE COUNTER MEDICATION, Theracurmin 4 daily with food., Disp: , Rfl:    Pectin Cit-Inos-C-Bioflav-Soy (MODIFIED CITRUS PECTIN PO), Take by mouth. One scoop BID, Disp: , Rfl:    PRESCRIPTION MEDICATION, There-Biotic Women's  1-2 daily, Disp: , Rfl:    QUERCETIN PO, Take by mouth. Quercetin Phytosome 250mg  - two daily., Disp: , Rfl:    Red Yeast Rice Extract (RED YEAST RICE PO), Take 1 capsule by mouth in the morning and at bedtime., Disp: , Rfl:    SELENIUM PO, Take 800 mg by mouth daily., Disp: , Rfl:    thyroid  (ARMOUR) 90 MG tablet, Take 90 mg by mouth every morning. (Patient taking differently: Take 60 mg by mouth every morning.), Disp: , Rfl:    UNABLE TO FIND, Take 1 tablet by mouth in the morning and at bedtime. Coriolus-4 capsules BID., Disp: , Rfl:    vitamin E 180 MG (400 UNITS) capsule, Take 400 Units by mouth daily. Takes 3 x weekly., Disp: , Rfl:    castor oil liquid, PRN lymph discomfort, Topical (Patient not taking: Reported on 03/28/2024), Disp: , Rfl:    OVER THE COUNTER MEDICATION, Sauna 2 times weekly. (Patient not taking: Reported on 03/28/2024), Disp: , Rfl:    Probiotic Product (PROBIOTIC BLEND PO), Take by mouth daily., Disp: , Rfl:   Allergies:  Allergies  Allergen Reactions   Bee Venom Anaphylaxis, Swelling and Other (See Comments)   Aspartame Diarrhea   Aspartame And Phenylalanine Diarrhea   Adhesive [Tape] Other (See Comments)    Irritation and red    Past Medical History, Surgical history, Social history, and Family History were reviewed and updated.  Review of Systems: Review of Systems  Constitutional: Negative.   HENT: Negative.     Eyes: Negative.   Respiratory: Negative.    Cardiovascular: Negative.   Gastrointestinal:  Positive for abdominal pain.  Genitourinary: Negative.   Musculoskeletal: Negative.   Skin: Negative.   Neurological: Negative.   Endo/Heme/Allergies: Negative.   Psychiatric/Behavioral: Negative.      Physical Exam: Her vital signs show temperature of 97.8.  Pulse 54.  Blood pressure 106/57.  Weight is 133 pounds.  Wt Readings from Last 3 Encounters:  03/28/24 133 lb (60.3 kg)  02/04/24 131 lb (59.4 kg)  12/28/23 124 lb 1.9 oz (56.3 kg)    Physical Exam Vitals reviewed.  HENT:     Head: Normocephalic and atraumatic.  Eyes:     Pupils: Pupils are equal, round, and reactive to light.  Cardiovascular:     Rate and Rhythm: Normal rate and regular rhythm.     Heart sounds: Normal heart sounds.  Pulmonary:     Effort: Pulmonary effort is normal.     Breath sounds: Normal breath sounds.  Abdominal:     General: Bowel sounds are normal.     Palpations: Abdomen is soft.     Comments: Abdominal exam shows a soft abdomen.  Sherry Williams has well healing laparoscopic scars.  I think there are 5 laparoscopic scars.  Sherry Williams has no erythema or exudate or warmth associated with these.  Sherry Williams has no fluid wave in the abdomen.  There is no guarding or rebound tenderness.  Sherry Williams has no palpable liver or spleen tip.  Musculoskeletal:        General: No tenderness or deformity. Normal range of motion.     Cervical back: Normal range of motion.  Lymphadenopathy:     Cervical: No cervical adenopathy.  Skin:    General: Skin is warm and dry.     Findings: No erythema or rash.  Neurological:     Mental Status: Sherry Williams is alert and oriented to person, place, and time.  Psychiatric:        Behavior: Behavior normal.        Thought Content: Thought content normal.        Judgment: Judgment normal.      Lab Results  Component Value Date   WBC 3.3 (L) 03/28/2024   HGB 11.8 (L) 03/28/2024   HCT 35.4 (L)  03/28/2024   MCV 102.6 (H) 03/28/2024   PLT 234 03/28/2024     Chemistry      Component Value Date/Time   NA 142 02/04/2024 0834   NA 142 02/15/2017 1518   NA 140 04/13/2016 1055   K 4.2 02/04/2024 0834   K 4.1 02/15/2017 1518   K 4.1 04/13/2016 1055   CL 105 02/04/2024 0834   CL 107 02/15/2017 1518   CO2 25 02/04/2024 0834   CO2 29 02/15/2017 1518   CO2 27 04/13/2016 1055   BUN 16 02/04/2024 0834   BUN 17 02/15/2017 1518   BUN 22.1 04/13/2016 1055   CREATININE 0.67 02/04/2024 0834   CREATININE 1.0 02/15/2017 1518   CREATININE 0.8 04/13/2016 1055      Component Value Date/Time   CALCIUM  9.4 02/04/2024 0834   CALCIUM  9.3 02/15/2017 1518   CALCIUM  9.9 04/13/2016 1055   ALKPHOS 78 02/04/2024 0834   ALKPHOS 79 02/15/2017 1518   ALKPHOS 81 04/13/2016 1055   AST 19 02/04/2024 0834   AST 20 04/13/2016 1055   ALT 17 02/04/2024 0834   ALT 24 02/15/2017 1518   ALT 22 04/13/2016 1055   BILITOT 0.6 02/04/2024 0834   BILITOT 0.44 04/13/2016 1055      Impression and Plan: Sherry Williams is a 63 year old Caucasian female.  Sherry Williams has had another recurrence of her gynecologic malignancy.  We gave her chemotherapy.  Sherry Williams did incredibly well with chemotherapy.  Sherry Williams now is on maintenance immunotherapy.  I am just very happy that Sherry Williams is done so well.  The recent  PET scan showed that  Sherry Williams has done incredibly well.  Sherry Williams is in remission.  We will go ahead with her treatment today.  After this, we will then switch to every 3 months.  I will add another PET scan on her probably in December.  Sherry Williams will get her Keytruda  today.  Am happy that her red cells are doing better.  I think her iron levels should be okay.  I happy that her white blood cell count is also better.  I will plan to see her back in January.  We will get her through the Holiday season.   Maude JONELLE Crease, MD 10/17/20258:50 AM

## 2024-03-28 NOTE — Telephone Encounter (Signed)
 Advised via MyChart.

## 2024-03-29 LAB — T3, FREE: T3, Free: 3.1 pg/mL (ref 2.0–4.4)

## 2024-03-29 LAB — T4: T4, Total: 4.6 ug/dL (ref 4.5–12.0)

## 2024-03-30 ENCOUNTER — Other Ambulatory Visit: Payer: Self-pay

## 2024-03-30 LAB — HIGH SENSITIVITY CRP: CRP, High Sensitivity: 0.57 mg/L (ref 0.00–3.00)

## 2024-04-01 ENCOUNTER — Other Ambulatory Visit: Payer: Self-pay

## 2024-04-01 ENCOUNTER — Encounter: Payer: Self-pay | Admitting: Hematology & Oncology

## 2024-04-03 ENCOUNTER — Other Ambulatory Visit: Payer: Self-pay

## 2024-04-08 ENCOUNTER — Encounter: Payer: Self-pay | Admitting: Hematology & Oncology

## 2024-05-02 ENCOUNTER — Encounter: Payer: Self-pay | Admitting: Hematology & Oncology

## 2024-06-01 ENCOUNTER — Other Ambulatory Visit: Payer: Self-pay

## 2024-06-02 ENCOUNTER — Ambulatory Visit (HOSPITAL_COMMUNITY)
Admission: RE | Admit: 2024-06-02 | Discharge: 2024-06-02 | Disposition: A | Discharge status: Home / Self Care | Source: Ambulatory Visit | Attending: Hematology & Oncology | Admitting: Hematology & Oncology

## 2024-06-02 DIAGNOSIS — C541 Malignant neoplasm of endometrium: Secondary | ICD-10-CM | POA: Diagnosis present

## 2024-06-02 LAB — GLUCOSE, CAPILLARY: Glucose-Capillary: 89 mg/dL (ref 70–99)

## 2024-06-02 MED ORDER — FLUDEOXYGLUCOSE F - 18 (FDG) INJECTION
6.5100 | Freq: Once | INTRAVENOUS | Status: AC
  Administered 2024-06-02: 6.51 via INTRAVENOUS

## 2024-06-03 ENCOUNTER — Encounter: Payer: Self-pay | Admitting: Hematology & Oncology

## 2024-06-16 ENCOUNTER — Encounter: Payer: Self-pay | Admitting: *Deleted

## 2024-06-16 ENCOUNTER — Ambulatory Visit: Payer: Self-pay | Admitting: Hematology & Oncology

## 2024-06-23 ENCOUNTER — Encounter: Payer: Self-pay | Admitting: Hematology & Oncology

## 2024-06-23 ENCOUNTER — Other Ambulatory Visit: Payer: Self-pay

## 2024-06-23 DIAGNOSIS — C541 Malignant neoplasm of endometrium: Secondary | ICD-10-CM

## 2024-06-23 DIAGNOSIS — D649 Anemia, unspecified: Secondary | ICD-10-CM

## 2024-06-27 ENCOUNTER — Inpatient Hospital Stay: Attending: Hematology & Oncology

## 2024-06-27 ENCOUNTER — Encounter: Payer: Self-pay | Admitting: Hematology & Oncology

## 2024-06-27 ENCOUNTER — Inpatient Hospital Stay

## 2024-06-27 ENCOUNTER — Inpatient Hospital Stay: Admitting: Hematology & Oncology

## 2024-06-27 VITALS — BP 96/48 | HR 55 | Resp 18

## 2024-06-27 VITALS — BP 99/44 | HR 60 | Temp 98.0°F | Resp 20 | Ht 64.0 in | Wt 134.0 lb

## 2024-06-27 DIAGNOSIS — I82401 Acute embolism and thrombosis of unspecified deep veins of right lower extremity: Secondary | ICD-10-CM | POA: Diagnosis not present

## 2024-06-27 DIAGNOSIS — C541 Malignant neoplasm of endometrium: Secondary | ICD-10-CM | POA: Insufficient documentation

## 2024-06-27 DIAGNOSIS — E039 Hypothyroidism, unspecified: Secondary | ICD-10-CM | POA: Insufficient documentation

## 2024-06-27 DIAGNOSIS — Z5112 Encounter for antineoplastic immunotherapy: Secondary | ICD-10-CM | POA: Insufficient documentation

## 2024-06-27 DIAGNOSIS — E063 Autoimmune thyroiditis: Secondary | ICD-10-CM

## 2024-06-27 DIAGNOSIS — D649 Anemia, unspecified: Secondary | ICD-10-CM

## 2024-06-27 DIAGNOSIS — Z79620 Long term (current) use of immunosuppressive biologic: Secondary | ICD-10-CM | POA: Insufficient documentation

## 2024-06-27 LAB — VITAMIN D 25 HYDROXY (VIT D DEFICIENCY, FRACTURES): Vit D, 25-Hydroxy: 96.9 ng/mL (ref 30–100)

## 2024-06-27 LAB — CBC WITH DIFFERENTIAL (CANCER CENTER ONLY)
Abs Immature Granulocytes: 0.01 K/uL (ref 0.00–0.07)
Basophils Absolute: 0 K/uL (ref 0.0–0.1)
Basophils Relative: 1 %
Eosinophils Absolute: 0 K/uL (ref 0.0–0.5)
Eosinophils Relative: 1 %
HCT: 36.9 % (ref 36.0–46.0)
Hemoglobin: 12.3 g/dL (ref 12.0–15.0)
Immature Granulocytes: 0 %
Lymphocytes Relative: 36 %
Lymphs Abs: 1.2 K/uL (ref 0.7–4.0)
MCH: 32.4 pg (ref 26.0–34.0)
MCHC: 33.3 g/dL (ref 30.0–36.0)
MCV: 97.1 fL (ref 80.0–100.0)
Monocytes Absolute: 0.4 K/uL (ref 0.1–1.0)
Monocytes Relative: 11 %
Neutro Abs: 1.7 K/uL (ref 1.7–7.7)
Neutrophils Relative %: 51 %
Platelet Count: 244 K/uL (ref 150–400)
RBC: 3.8 MIL/uL — ABNORMAL LOW (ref 3.87–5.11)
RDW: 12.2 % (ref 11.5–15.5)
WBC Count: 3.4 K/uL — ABNORMAL LOW (ref 4.0–10.5)
nRBC: 0 % (ref 0.0–0.2)

## 2024-06-27 LAB — CMP (CANCER CENTER ONLY)
ALT: 29 U/L (ref 0–44)
AST: 23 U/L (ref 15–41)
Albumin: 4.3 g/dL (ref 3.5–5.0)
Alkaline Phosphatase: 65 U/L (ref 38–126)
Anion gap: 11 (ref 5–15)
BUN: 18 mg/dL (ref 8–23)
CO2: 24 mmol/L (ref 22–32)
Calcium: 9.2 mg/dL (ref 8.9–10.3)
Chloride: 107 mmol/L (ref 98–111)
Creatinine: 0.84 mg/dL (ref 0.44–1.00)
GFR, Estimated: >60 mL/min
Glucose, Bld: 78 mg/dL (ref 70–99)
Potassium: 4.3 mmol/L (ref 3.5–5.1)
Sodium: 142 mmol/L (ref 135–145)
Total Bilirubin: 0.4 mg/dL (ref 0.0–1.2)
Total Protein: 6.3 g/dL — ABNORMAL LOW (ref 6.5–8.1)

## 2024-06-27 LAB — HEMOGLOBIN A1C
Hgb A1c MFr Bld: 5 % (ref 4.8–5.6)
Mean Plasma Glucose: 96.8 mg/dL

## 2024-06-27 LAB — MAGNESIUM: Magnesium: 2.2 mg/dL (ref 1.7–2.4)

## 2024-06-27 LAB — SEDIMENTATION RATE: Sed Rate: 3 mm/h (ref 0–22)

## 2024-06-27 LAB — T4, FREE: Free T4: 0.9 ng/dL (ref 0.80–2.00)

## 2024-06-27 LAB — LACTATE DEHYDROGENASE: LDH: 168 U/L (ref 105–235)

## 2024-06-27 LAB — IRON AND IRON BINDING CAPACITY (CC-WL,HP ONLY)
Iron: 95 ug/dL (ref 28–170)
Saturation Ratios: 31 % (ref 10.4–31.8)
TIBC: 309 ug/dL (ref 250–450)
UIBC: 214 ug/dL

## 2024-06-27 LAB — TSH: TSH: 2.46 u[IU]/mL (ref 0.350–4.500)

## 2024-06-27 LAB — GAMMA GT: GGT: 6 U/L — ABNORMAL LOW (ref 7–50)

## 2024-06-27 LAB — FERRITIN: Ferritin: 117 ng/mL (ref 11–307)

## 2024-06-27 LAB — PHOSPHORUS: Phosphorus: 3.8 mg/dL (ref 2.5–4.6)

## 2024-06-27 MED ORDER — SODIUM CHLORIDE 0.9 % IV SOLN: Freq: Once | INTRAVENOUS | Status: AC

## 2024-06-27 MED ORDER — SODIUM CHLORIDE 0.9 % IV SOLN
400.0000 mg | Freq: Once | INTRAVENOUS | Status: AC
  Administered 2024-06-27: 400 mg via INTRAVENOUS
  Filled 2024-06-27: qty 16

## 2024-06-27 NOTE — Progress Notes (Signed)
 " Hematology and Oncology Follow Up Visit  Sherry Williams [id number] 1961-03-14 64 y.o. 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  She looks great.  She had a wonderful Holiday season.  She was with her family.  So far, they do not have much plan for 2026.  They will hopefully go to [place] in the Fall.  She has had no problems with nausea or vomiting.  She has had no bleeding.  She does do Arixtra  because of recurrent thromboembolic disease.  We did do a PET scan on her.  This was done on 06/02/2024.  The PET scan did not show any evidence of recurrent endometrial carcinoma.  Her last CA -125 was 9.6.  She has had no change in bowel or bladder habits.  She is staying active.  She tries to exercise.  There is no cough or shortness of breath.  She has had no headache.  She does have some bruising on the abdominal wall secondary to the Arixtra .  Her last iron studies showed a ferritin of 146 and iron saturation of 33%.  Overall, I would say her performance status is ECOG 0.   Medications:  Current Outpatient Medications:    Ascorbic Acid (VITAMIN C) 1000 MG tablet, Take 2,000 mg by mouth in the morning and at bedtime. Also receives Vitamin C infusion 75 mg/week, Disp: , Rfl:    ASTRAGALUS PO, Take by mouth. Takes 2 caps BID., Disp: , Rfl:    Berberine Chloride (BERBERINE HCI PO), Take 3 capsules by mouth daily., Disp: , Rfl:     Boswellia Serrata (BOSWELLIA PO), Take 2 capsules by mouth daily. Frankincense, Disp: , Rfl:    CHLOROPHYLL PO, Take by mouth. One teaspoon prior to coffee enemas., Disp: , Rfl:    cimetidine (TAGAMET) 400 MG tablet, Take 400 mg by mouth 2 (two) times daily. 03/23/2023 Only takes M-F., Disp: , Rfl:    co-enzyme Q-10 30 MG capsule, Take 100 mg by mouth 2 (two) times daily., Disp: , Rfl:    doxycycline (MONODOX) 100 MG capsule, Take 100 mg by mouth 2 (two) times daily., Disp: , Rfl:    fondaparinux  (ARIXTRA ) 5 MG/0.4ML SOLN injection, Inject 0.2 mLs (2.5 mg total) into the skin daily., Disp: 12 mL, Rfl: 5   Grape Seed Extract 100 MG CAPS, Take 1 capsule by mouth in the morning and at bedtime., Disp: , Rfl:    Green Tea, Camellia sinensis, (GREEN TEA EXTRACT PO), Take 2 capsules by mouth 2 (two) times daily., Disp: , Rfl:    ivermectin (STROMECTOL) 3 MG TABS tablet, Take 150 mcg/kg by mouth in the morning and at bedtime., Disp: , Rfl:    lidocaine -prilocaine  (EMLA ) cream, APPLY TOPICALLY TO THE AFFECTED AREA 1 TIME, Disp: 30 g, Rfl: 4   MELATONIN ER PO, Take 180 mg by mouth at bedtime., Disp: , Rfl:    MILK THISTLE PLUS PO, Take 3  tablets by mouth at bedtime. Dandelion, Disp: , Rfl:    Naltrexone HCl, Pain, (NALTREX) 4.5 MG CAPS, Take by mouth at bedtime., Disp: , Rfl:    Omega-3 Fatty Acids (FISH OIL) 300 MG CAPS, Take 1 capsule by mouth in the morning and at bedtime., Disp: , Rfl:    ondansetron  (ZOFRAN ) 8 MG tablet, Take 1 tablet (8 mg total) by mouth every 8 (eight) hours as needed for nausea or vomiting., Disp: 30 tablet, Rfl: 1   OVER THE COUNTER MEDICATION, Coffee enemas-2 x weekly., Disp: , Rfl:    OVER THE COUNTER MEDICATION, Natto Serrazime - Three times daily., Disp: , Rfl:    OVER THE COUNTER MEDICATION, Rebounding prn lymph swelling, Disp: , Rfl:    OVER THE COUNTER MEDICATION, Mistletoe Pini, thigh injections 1/week until reaction then increase dose 09-20-22, pause for 2  Keytruda  cycles, then resume 3/weekly on non IV Vitamin C days., Disp: , Rfl:    OVER THE COUNTER MEDICATION, Theracurmin 4 daily with food., Disp: , Rfl:    Pectin Cit-Inos-C-Bioflav-Soy (MODIFIED CITRUS PECTIN PO), Take by mouth. One scoop BID, Disp: , Rfl:    PRESCRIPTION MEDICATION, There-Biotic Women's  1-2 daily, Disp: , Rfl:    Probiotic Product (PROBIOTIC BLEND PO), Take by mouth daily., Disp: , Rfl:    QUERCETIN PO, Take by mouth. Quercetin Phytosome 250mg  - two daily., Disp: , Rfl:    Red Yeast Rice Extract (RED YEAST RICE PO), Take 1 capsule by mouth in the morning and at bedtime., Disp: , Rfl:    SELENIUM PO, Take 800 mg by mouth daily., Disp: , Rfl:    thyroid  (ARMOUR) 90 MG tablet, Take 90 mg by mouth every morning., Disp: , Rfl:    UNABLE TO FIND, Take 1 tablet by mouth in the morning and at bedtime. Coriolus-4 capsules BID., Disp: , Rfl:    vitamin E 180 MG (400 UNITS) capsule, Take 400 Units by mouth daily. Takes 3 x weekly., Disp: , Rfl:    castor oil liquid, PRN lymph discomfort, Topical (Patient not taking: Reported on 06/27/2024), Disp: , Rfl:    OVER THE COUNTER MEDICATION, Sauna 2 times weekly. (Patient not taking: Reported on 06/27/2024), Disp: , Rfl:   Allergies:  Allergies  Allergen Reactions   Bee Venom Anaphylaxis, Swelling and Other (See Comments)   Aspartame Diarrhea   Aspartame And Phenylalanine Diarrhea   Adhesive [Tape] Other (See Comments)    Irritation and red    Past Medical History, Surgical history, Social history, and Family History were reviewed and updated.  Review of Systems: Review of Systems  Constitutional: Negative.   HENT: Negative.    Eyes: Negative.   Respiratory: Negative.    Cardiovascular: Negative.   Gastrointestinal:  Positive for abdominal pain.  Genitourinary: Negative.   Musculoskeletal: Negative.   Skin: Negative.   Neurological: Negative.   Endo/Heme/Allergies: Negative.   Psychiatric/Behavioral: Negative.       Physical Exam: Her vital signs show temperature of 98.  Pulse 60.  Blood pressure 99/44.  Weight is 134 pounds.      Wt Readings from Last 3 Encounters:  06/27/24 134 lb (60.8 kg)  03/28/24 133 lb (60.3 kg)  02/04/24 131 lb (59.4 kg)    Physical Exam Vitals reviewed.  HENT:     Head: Normocephalic and atraumatic.  Eyes:     Pupils: Pupils are equal, round, and reactive to light.  Cardiovascular:     Rate and Rhythm: Normal rate and regular  rhythm.     Heart sounds: Normal heart sounds.  Pulmonary:     Effort: Pulmonary effort is normal.     Breath sounds: Normal breath sounds.  Abdominal:     General: Bowel sounds are normal.     Palpations: Abdomen is soft.     Comments: Abdominal exam shows a soft abdomen.  She has well healing laparoscopic scars.  I think there are 5 laparoscopic scars.  She has no erythema or exudate or warmth associated with these.  She has no fluid wave in the abdomen.  There is no guarding or rebound tenderness.  She has no palpable liver or spleen tip.  Musculoskeletal:        General: No tenderness or deformity. Normal range of motion.     Cervical back: Normal range of motion.  Lymphadenopathy:     Cervical: No cervical adenopathy.  Skin:    General: Skin is warm and dry.     Findings: No erythema or rash.  Neurological:     Mental Status: She is alert and oriented to person, place, and time.  Psychiatric:        Behavior: Behavior normal.        Thought Content: Thought content normal.        Judgment: Judgment normal.      Lab Results  Component Value Date   WBC 3.4 (L) 06/27/2024   HGB 12.3 06/27/2024   HCT 36.9 06/27/2024   MCV 97.1 06/27/2024   PLT 244 06/27/2024     Chemistry      Component Value Date/Time   NA 142 03/28/2024 0817   NA 142 02/15/2017 1518   NA 140 04/13/2016 1055   K 4.4 03/28/2024 0817   K 4.1 02/15/2017 1518   K 4.1 04/13/2016 1055   CL 104 03/28/2024 0817   CL 107 02/15/2017 1518   CO2 25  03/28/2024 0817   CO2 29 02/15/2017 1518   CO2 27 04/13/2016 1055   BUN 16 03/28/2024 0817   BUN 17 02/15/2017 1518   BUN 22.1 04/13/2016 1055   CREATININE 0.91 03/28/2024 0817   CREATININE 1.0 02/15/2017 1518   CREATININE 0.8 04/13/2016 1055      Component Value Date/Time   CALCIUM  9.8 03/28/2024 0817   CALCIUM  9.3 02/15/2017 1518   CALCIUM  9.9 04/13/2016 1055   ALKPHOS 90 03/28/2024 0817   ALKPHOS 79 02/15/2017 1518   ALKPHOS 81 04/13/2016 1055   AST 29 03/28/2024 0817   AST 20 04/13/2016 1055   ALT 20 03/28/2024 0817   ALT 24 02/15/2017 1518   ALT 22 04/13/2016 1055   BILITOT 0.8 03/28/2024 0817   BILITOT 0.44 04/13/2016 1055      Impression and Plan: Sherry Williams is a 64 year old Caucasian female.  She has had another recurrence of her gynecologic malignancy.  We gave her chemotherapy.  She did incredibly well with chemotherapy.  She now is on maintenance immunotherapy.  I am just very happy that she is done so well.  The recent  PET scan showed that she has done incredibly well.  She is in remission.  We will go ahead with her treatment today.  I think that she will do well with every 64-month treatments.  She will continue with the Arixtra .  I would think that her iron studies should be okay.  As far as the PET scan goes, we probably can wait about 4 months or so.  Will plan to get her back in  3 months.    Maude JONELLE Crease, MD 1/16/20269:01 AM      "

## 2024-06-27 NOTE — Patient Instructions (Signed)

## 2024-06-27 NOTE — Patient Instructions (Signed)
 [facility] - A DEPT OF [facility]  Discharge Instructions: Thank you for choosing [facility] to provide your oncology and hematology care.   If you have a lab appointment with the Cancer Center, please go directly to the Cancer Center and check in at the registration area.  Wear comfortable clothing and clothing appropriate for easy access to any Portacath or PICC line.   We strive to give you quality time with your provider. You may need to reschedule your appointment if you arrive late (15 or more minutes).  Arriving late affects you and other patients whose appointments are after yours.  Also, if you miss three or more appointments without notifying the office, you may be dismissed from the clinic at the provider's discretion.      For prescription refill requests, have your pharmacy contact our office and allow 72 hours for refills to be completed.    Today you received the following chemotherapy and/or immunotherapy agents Keytruda       To help prevent nausea and vomiting after your treatment, we encourage you to take your nausea medication as directed.  BELOW ARE SYMPTOMS THAT SHOULD BE REPORTED IMMEDIATELY: *FEVER GREATER THAN 100.4 F (38 C) OR HIGHER *CHILLS OR SWEATING *NAUSEA AND VOMITING THAT IS NOT CONTROLLED WITH YOUR NAUSEA MEDICATION *UNUSUAL SHORTNESS OF BREATH *UNUSUAL BRUISING OR BLEEDING *URINARY PROBLEMS (pain or burning when urinating, or frequent urination) *BOWEL PROBLEMS (unusual diarrhea, constipation, pain near the anus) TENDERNESS IN MOUTH AND THROAT WITH OR WITHOUT PRESENCE OF ULCERS (sore throat, sores in mouth, or a toothache) UNUSUAL RASH, SWELLING OR PAIN  UNUSUAL VAGINAL DISCHARGE OR ITCHING   Items with * indicate a potential emergency and should be followed up as soon as possible or go to the Emergency Department if any problems should occur.  Please show the CHEMOTHERAPY ALERT CARD or IMMUNOTHERAPY  ALERT CARD at check-in to the Emergency Department and triage nurse. Should you have questions after your visit or need to cancel or reschedule your appointment, please contact [facility] - A DEPT OF [facility]  [phone] and follow the prompts.  Office hours are 8:00 a.m. to 4:30 p.m. Monday - Friday. Please note that voicemails left after 4:00 p.m. may not be returned until the following business day.  We are closed weekends and major holidays. You have access to a nurse at all times for urgent questions. Please call the main number to the clinic [phone] and follow the prompts.  For any non-urgent questions, you may also contact your provider using MyChart. We now offer e-Visits for anyone 7 and older to request care online for non-urgent symptoms. For details visit [email or web address].   Also download the MyChart app! Go to the app store, search "MyChart", open the app, select [place], and log in with your MyChart username and password.

## 2024-06-28 ENCOUNTER — Other Ambulatory Visit: Payer: Self-pay

## 2024-06-28 LAB — T3, FREE: T3, Free: 3.5 pg/mL (ref 2.0–4.4)

## 2024-06-28 LAB — HIGH SENSITIVITY CRP: CRP, High Sensitivity: 0.42 mg/L (ref 0.00–3.00)

## 2024-06-28 LAB — T4: T4, Total: 4.8 ug/dL (ref 4.5–12.0)

## 2024-07-15 ENCOUNTER — Other Ambulatory Visit: Payer: Self-pay

## 2024-09-26 ENCOUNTER — Inpatient Hospital Stay

## 2024-09-26 ENCOUNTER — Inpatient Hospital Stay: Admitting: Hematology & Oncology
